# Patient Record
Sex: Male | Born: 1945 | Race: White | Hispanic: No | Marital: Married | Smoking: Never smoker
Health system: Southern US, Community
[De-identification: ages and names within clinical notes are randomized; demographics above are authoritative.]

## PROBLEM LIST (undated history)

## (undated) ENCOUNTER — Ambulatory Visit: Admission: EM | Payer: Medicare HMO | Source: Home / Self Care

## (undated) DIAGNOSIS — E7800 Pure hypercholesterolemia, unspecified: Secondary | ICD-10-CM

## (undated) DIAGNOSIS — I10 Essential (primary) hypertension: Secondary | ICD-10-CM

## (undated) DIAGNOSIS — Z8719 Personal history of other diseases of the digestive system: Secondary | ICD-10-CM

## (undated) DIAGNOSIS — D649 Anemia, unspecified: Secondary | ICD-10-CM

## (undated) DIAGNOSIS — K259 Gastric ulcer, unspecified as acute or chronic, without hemorrhage or perforation: Secondary | ICD-10-CM

## (undated) DIAGNOSIS — G4730 Sleep apnea, unspecified: Secondary | ICD-10-CM

## (undated) DIAGNOSIS — F32A Depression, unspecified: Secondary | ICD-10-CM

## (undated) DIAGNOSIS — K5730 Diverticulosis of large intestine without perforation or abscess without bleeding: Secondary | ICD-10-CM

## (undated) DIAGNOSIS — F419 Anxiety disorder, unspecified: Secondary | ICD-10-CM

## (undated) DIAGNOSIS — M797 Fibromyalgia: Secondary | ICD-10-CM

## (undated) HISTORY — PX: COLONOSCOPY: SHX174

## (undated) HISTORY — PX: OTHER SURGICAL HISTORY: SHX169

## (undated) HISTORY — PX: EYE SURGERY: SHX253

## (undated) HISTORY — PX: UPPER GASTROINTESTINAL ENDOSCOPY: SHX188

## (undated) HISTORY — DX: Personal history of other diseases of the digestive system: Z87.19

## (undated) HISTORY — DX: Fibromyalgia: M79.7

## (undated) HISTORY — PX: CHOLECYSTECTOMY: SHX55

---

## 1993-12-16 HISTORY — PX: GALLBLADDER SURGERY: SHX652

## 2002-09-23 ENCOUNTER — Ambulatory Visit (HOSPITAL_COMMUNITY): Admission: RE | Admit: 2002-09-23 | Discharge: 2002-09-23 | Payer: Self-pay | Admitting: Family Medicine

## 2002-09-23 ENCOUNTER — Encounter: Payer: Self-pay | Admitting: Family Medicine

## 2002-10-16 ENCOUNTER — Encounter: Payer: Self-pay | Admitting: Family Medicine

## 2002-10-16 ENCOUNTER — Ambulatory Visit (HOSPITAL_COMMUNITY): Admission: RE | Admit: 2002-10-16 | Discharge: 2002-10-16 | Payer: Self-pay | Admitting: Family Medicine

## 2002-12-25 ENCOUNTER — Encounter: Payer: Self-pay | Admitting: Family Medicine

## 2002-12-25 ENCOUNTER — Ambulatory Visit (HOSPITAL_COMMUNITY): Admission: RE | Admit: 2002-12-25 | Discharge: 2002-12-25 | Payer: Self-pay | Admitting: Family Medicine

## 2003-11-18 ENCOUNTER — Ambulatory Visit (HOSPITAL_COMMUNITY): Admission: RE | Admit: 2003-11-18 | Discharge: 2003-11-18 | Payer: Self-pay | Admitting: Family Medicine

## 2003-11-19 ENCOUNTER — Emergency Department (HOSPITAL_COMMUNITY): Admission: EM | Admit: 2003-11-19 | Discharge: 2003-11-20 | Payer: Self-pay | Admitting: Emergency Medicine

## 2004-04-24 ENCOUNTER — Ambulatory Visit: Payer: Self-pay | Admitting: Internal Medicine

## 2004-10-11 ENCOUNTER — Ambulatory Visit: Payer: Self-pay | Admitting: Internal Medicine

## 2004-11-06 ENCOUNTER — Ambulatory Visit (HOSPITAL_COMMUNITY): Admission: RE | Admit: 2004-11-06 | Discharge: 2004-11-06 | Payer: Self-pay | Admitting: Internal Medicine

## 2004-11-06 ENCOUNTER — Ambulatory Visit: Payer: Self-pay | Admitting: Internal Medicine

## 2005-08-20 ENCOUNTER — Inpatient Hospital Stay (HOSPITAL_COMMUNITY): Admission: EM | Admit: 2005-08-20 | Discharge: 2005-08-22 | Payer: Self-pay | Admitting: Emergency Medicine

## 2005-12-10 ENCOUNTER — Ambulatory Visit: Payer: Self-pay | Admitting: Internal Medicine

## 2013-01-27 ENCOUNTER — Other Ambulatory Visit (HOSPITAL_COMMUNITY): Payer: Self-pay | Admitting: Physician Assistant

## 2013-01-27 DIAGNOSIS — R109 Unspecified abdominal pain: Secondary | ICD-10-CM

## 2013-01-27 DIAGNOSIS — K649 Unspecified hemorrhoids: Secondary | ICD-10-CM

## 2013-01-29 ENCOUNTER — Encounter (HOSPITAL_COMMUNITY): Payer: Self-pay

## 2013-01-29 ENCOUNTER — Ambulatory Visit (HOSPITAL_COMMUNITY)
Admission: RE | Admit: 2013-01-29 | Discharge: 2013-01-29 | Disposition: A | Discharge status: Home / Self Care | Payer: Medicare Other | Source: Ambulatory Visit | Attending: Physician Assistant | Admitting: Physician Assistant

## 2013-01-29 DIAGNOSIS — K4090 Unilateral inguinal hernia, without obstruction or gangrene, not specified as recurrent: Secondary | ICD-10-CM | POA: Insufficient documentation

## 2013-01-29 DIAGNOSIS — R110 Nausea: Secondary | ICD-10-CM | POA: Insufficient documentation

## 2013-01-29 DIAGNOSIS — R109 Unspecified abdominal pain: Secondary | ICD-10-CM

## 2013-01-29 DIAGNOSIS — K649 Unspecified hemorrhoids: Secondary | ICD-10-CM

## 2013-01-29 DIAGNOSIS — R1031 Right lower quadrant pain: Secondary | ICD-10-CM | POA: Insufficient documentation

## 2013-01-29 HISTORY — DX: Essential (primary) hypertension: I10

## 2013-01-29 MED ORDER — IOHEXOL 300 MG/ML  SOLN
100.0000 mL | Freq: Once | INTRAMUSCULAR | Status: AC | PRN
  Administered 2013-01-29: 100 mL via INTRAVENOUS

## 2014-01-21 ENCOUNTER — Encounter (INDEPENDENT_AMBULATORY_CARE_PROVIDER_SITE_OTHER): Payer: Self-pay | Admitting: *Deleted

## 2014-04-20 ENCOUNTER — Ambulatory Visit (INDEPENDENT_AMBULATORY_CARE_PROVIDER_SITE_OTHER): Payer: Commercial Managed Care - HMO | Admitting: Internal Medicine

## 2014-04-20 ENCOUNTER — Encounter (INDEPENDENT_AMBULATORY_CARE_PROVIDER_SITE_OTHER): Payer: Self-pay | Admitting: Internal Medicine

## 2014-04-20 VITALS — BP 130/80 | HR 66 | Temp 97.5°F | Resp 16 | Ht 72.0 in | Wt 202.4 lb

## 2014-04-20 DIAGNOSIS — R109 Unspecified abdominal pain: Secondary | ICD-10-CM

## 2014-04-20 NOTE — Progress Notes (Signed)
   HPI   Review of Systems     Objective:   Physical Exam

## 2014-04-20 NOTE — Patient Instructions (Addendum)
Will review prior GI records and blood work before colonoscopy scheduled

## 2014-04-21 NOTE — Progress Notes (Signed)
Presenting complaint;  Abdominal pain and in need of screening colonoscopy.  History of present illness;  Patient is 68 year old Caucasian male who is referred through courtesy of Dr. Ethlyn Gallery in for GI evaluation. Patient is well known to me but has not been seen recently. Patient gives several month history of intermittent epigastric and midabdominal pain which is usually triggered with meals. It usually occurs after lunch and supper and may last for 1-2 hours. It is dull pain. He has noted abdominal pain occurs when he's having problems with allergy involving his skin or sinuses. This pain is never associated with nausea vomiting melena or rectal bleeding. He has sporadic heartburnand it comes in spellsand OTC Zantac takes care of it. He states when he has abdominal pain he takes Tylenol and Benadryl and it helps ease the pain. He has had intermittent constipation which she believes is secondary to antihypertensives. He is using OTC suppository once or twice a week with good results. He did try Colace but it didn't work. He consumes fruits on daily basis. He has good appetite and his weight has been stable. He denies rash fever chills or night sweats. He has chronic pain secondary to fibromyalgia and he goes to [employer] 3 times a week for exercise which has helped him a great deal. Regarding his allergies he did try Singulair and Allegra but had reaction to these medications. He does not take OTC NSAIDs. Patient's last colonoscopy was in May 2006.   Current Medications: Outpatient Encounter Prescriptions as of 04/20/2014  Medication Sig  . acetaminophen (TYLENOL) 650 MG CR tablet Take 650 mg by mouth 4 (four) times daily.  . diphenhydrAMINE (BENADRYL) 25 MG tablet Take 25 mg by mouth as needed.  . fluticasone (FLONASE ALLERGY RELIEF) 50 MCG/ACT nasal spray Place 2 sprays into both nostrils as needed for allergies or rhinitis.  . Homeopathic Products (ARNICA) GEL Apply topically as needed. Arnica  Motanna GEL  . hydrochlorothiazide (HYDRODIURIL) 12.5 MG tablet Take 12.5 mg by mouth daily.  Marland Kitchen levocetirizine (XYZAL) 5 MG tablet Take 5 mg by mouth every evening.  Marland Kitchen losartan (COZAAR) 50 MG tablet Take 50 mg by mouth daily.  . Olopatadine HCl (PATADAY) 0.2 % SOLN Apply to eye. One drop in each eye daily  . ranitidine (ZANTAC) 75 MG tablet Take 75 mg by mouth as needed for heartburn.  Marland Kitchen rOPINIRole (REQUIP) 1 MG tablet Take 1 mg by mouth every 12 (twelve) hours.  . sodium chloride (MURO 128) 5 % ophthalmic solution Place 1 drop into both eyes as needed for irritation.  Marland Kitchen testosterone cypionate (DEPOTESTOTERONE CYPIONATE) 200 MG/ML injection Inject into the muscle. Patient states that he rec'd an injection every 5-6 weeks  . traMADol (ULTRAM) 50 MG tablet Take by mouth 4 (four) times daily as needed.  . triazolam (HALCION) 0.25 MG tablet Take 0.25 mg by mouth at bedtime.  . trolamine salicylate (ASPERCREME) 10 % cream Apply 1 application topically as needed for muscle pain.   Past medical history; Hypertension of 8 years duration. He has problems with allergies. Restless leg syndrome. Fibromyalgia. Chronic insomnia. He had a laparoscopic cholecystectomy in 4081 complicated by bleed requiring laparotomy. He believes he received 2 or 4 units of PRBCs. Last colonoscopy was in May 2006 and no polyps were found.  Allergies; Allergies  Allergen Reactions  . Lodine [Etodolac]    Family history; Father died at age 50 of prostate cancer. Mother died at 71 of pancreatic carcinoma within 6 months of diagnosis. He  has 1 brother with Parkinson's disease and not doing well. He is 7 years old and bedridden. He has 3 sisters. One has visual problems once; one sister has asthma and younger sister age 34 is in good health.  Social history; He is married but does not have any children. He has never smoked cigarettes and drinks few sips of alcohol daily, always less than 1 drink per day. He is  retired. His work Engineer, structural.   Physical exam;  Blood pressure 130/80, pulse 66, temperature 97.5 F (36.4 C), temperature source Oral, resp. rate 16, height 6' (1.829 m), weight 202 lb 6.4 oz (91.808 kg). Patient is alert and in no acute distress. Conjunctiva is pink. Sclera is nonicteric Oropharyngeal mucosa is normal. No neck masses or thyromegaly noted. Cardiac exam with regular rhythm normal S1 and S2. No murmur or gallop noted. Lungs are clear to auscultation. Abdomen is symmetrical. Bowel sounds are normal. On palpation abdomen is soft and non-tender without organomegaly or masses. Rectal examination deferred as he had one in July revealing guaiac-negative stool.  No LE edema or clubbing noted.   Assessment:  #1. Intermittent upper and midabdominal pain which usually occurs when he is having problems with allergies and therefore may be allergy mediated. His pain is not progressive and he is able to control it with Tylenol and Benadryl and would consider further evaluation if symptoms progress. I would like to check eosinophil count with next significant episode.if indeed he has eosinophilia he may benefit from cromolyn sodium. #2. He is average risk for CRC. Will review prior records before scheduling him for screening colonoscopy.  Recommendations.  Review blood work from Dr. Berenice Bouton office. Requests prior colonoscopy rectal is scheduled. Patient will call if he has another episode of significant pain in which case we'll consider CBC with differential LFTs and serum amylase.

## 2014-04-21 NOTE — Progress Notes (Deleted)
Subjective:     Patient ID: Cole Brown, male   DOB: 1945-12-29, 68 y.o.   MRN: [id number]  HPI   Review of Systems     Objective:   Physical Exam     Assessment:     ***    Plan:     ***

## 2014-06-21 DIAGNOSIS — J301 Allergic rhinitis due to pollen: Secondary | ICD-10-CM | POA: Diagnosis not present

## 2014-06-21 DIAGNOSIS — J3089 Other allergic rhinitis: Secondary | ICD-10-CM | POA: Diagnosis not present

## 2014-06-21 DIAGNOSIS — J3081 Allergic rhinitis due to animal (cat) (dog) hair and dander: Secondary | ICD-10-CM | POA: Diagnosis not present

## 2014-06-21 DIAGNOSIS — L570 Actinic keratosis: Secondary | ICD-10-CM | POA: Diagnosis not present

## 2014-06-25 DIAGNOSIS — J3081 Allergic rhinitis due to animal (cat) (dog) hair and dander: Secondary | ICD-10-CM | POA: Diagnosis not present

## 2014-06-25 DIAGNOSIS — J301 Allergic rhinitis due to pollen: Secondary | ICD-10-CM | POA: Diagnosis not present

## 2014-06-25 DIAGNOSIS — J3089 Other allergic rhinitis: Secondary | ICD-10-CM | POA: Diagnosis not present

## 2014-06-30 DIAGNOSIS — E291 Testicular hypofunction: Secondary | ICD-10-CM | POA: Diagnosis not present

## 2014-06-30 DIAGNOSIS — J301 Allergic rhinitis due to pollen: Secondary | ICD-10-CM | POA: Diagnosis not present

## 2014-06-30 DIAGNOSIS — J3089 Other allergic rhinitis: Secondary | ICD-10-CM | POA: Diagnosis not present

## 2014-06-30 DIAGNOSIS — J3081 Allergic rhinitis due to animal (cat) (dog) hair and dander: Secondary | ICD-10-CM | POA: Diagnosis not present

## 2014-06-30 DIAGNOSIS — L570 Actinic keratosis: Secondary | ICD-10-CM | POA: Diagnosis not present

## 2014-07-05 DIAGNOSIS — J3081 Allergic rhinitis due to animal (cat) (dog) hair and dander: Secondary | ICD-10-CM | POA: Diagnosis not present

## 2014-07-05 DIAGNOSIS — J3089 Other allergic rhinitis: Secondary | ICD-10-CM | POA: Diagnosis not present

## 2014-07-05 DIAGNOSIS — J301 Allergic rhinitis due to pollen: Secondary | ICD-10-CM | POA: Diagnosis not present

## 2014-07-14 DIAGNOSIS — J3081 Allergic rhinitis due to animal (cat) (dog) hair and dander: Secondary | ICD-10-CM | POA: Diagnosis not present

## 2014-07-14 DIAGNOSIS — J3089 Other allergic rhinitis: Secondary | ICD-10-CM | POA: Diagnosis not present

## 2014-07-14 DIAGNOSIS — J301 Allergic rhinitis due to pollen: Secondary | ICD-10-CM | POA: Diagnosis not present

## 2014-07-19 DIAGNOSIS — J3089 Other allergic rhinitis: Secondary | ICD-10-CM | POA: Diagnosis not present

## 2014-07-19 DIAGNOSIS — J301 Allergic rhinitis due to pollen: Secondary | ICD-10-CM | POA: Diagnosis not present

## 2014-07-19 DIAGNOSIS — J3081 Allergic rhinitis due to animal (cat) (dog) hair and dander: Secondary | ICD-10-CM | POA: Diagnosis not present

## 2014-07-21 ENCOUNTER — Encounter (INDEPENDENT_AMBULATORY_CARE_PROVIDER_SITE_OTHER): Payer: Self-pay

## 2014-07-26 DIAGNOSIS — J301 Allergic rhinitis due to pollen: Secondary | ICD-10-CM | POA: Diagnosis not present

## 2014-07-26 DIAGNOSIS — J3089 Other allergic rhinitis: Secondary | ICD-10-CM | POA: Diagnosis not present

## 2014-07-26 DIAGNOSIS — E236 Other disorders of pituitary gland: Secondary | ICD-10-CM | POA: Diagnosis not present

## 2014-07-26 DIAGNOSIS — J3081 Allergic rhinitis due to animal (cat) (dog) hair and dander: Secondary | ICD-10-CM | POA: Diagnosis not present

## 2014-08-03 DIAGNOSIS — E291 Testicular hypofunction: Secondary | ICD-10-CM | POA: Diagnosis not present

## 2014-08-04 DIAGNOSIS — J3081 Allergic rhinitis due to animal (cat) (dog) hair and dander: Secondary | ICD-10-CM | POA: Diagnosis not present

## 2014-08-04 DIAGNOSIS — J301 Allergic rhinitis due to pollen: Secondary | ICD-10-CM | POA: Diagnosis not present

## 2014-08-04 DIAGNOSIS — J3089 Other allergic rhinitis: Secondary | ICD-10-CM | POA: Diagnosis not present

## 2014-08-09 DIAGNOSIS — J301 Allergic rhinitis due to pollen: Secondary | ICD-10-CM | POA: Diagnosis not present

## 2014-08-09 DIAGNOSIS — J3081 Allergic rhinitis due to animal (cat) (dog) hair and dander: Secondary | ICD-10-CM | POA: Diagnosis not present

## 2014-08-09 DIAGNOSIS — J3089 Other allergic rhinitis: Secondary | ICD-10-CM | POA: Diagnosis not present

## 2014-08-16 DIAGNOSIS — J3081 Allergic rhinitis due to animal (cat) (dog) hair and dander: Secondary | ICD-10-CM | POA: Diagnosis not present

## 2014-08-16 DIAGNOSIS — J301 Allergic rhinitis due to pollen: Secondary | ICD-10-CM | POA: Diagnosis not present

## 2014-08-16 DIAGNOSIS — J3089 Other allergic rhinitis: Secondary | ICD-10-CM | POA: Diagnosis not present

## 2014-08-23 DIAGNOSIS — J3089 Other allergic rhinitis: Secondary | ICD-10-CM | POA: Diagnosis not present

## 2014-08-23 DIAGNOSIS — J3081 Allergic rhinitis due to animal (cat) (dog) hair and dander: Secondary | ICD-10-CM | POA: Diagnosis not present

## 2014-08-23 DIAGNOSIS — J301 Allergic rhinitis due to pollen: Secondary | ICD-10-CM | POA: Diagnosis not present

## 2014-08-25 DIAGNOSIS — E291 Testicular hypofunction: Secondary | ICD-10-CM | POA: Diagnosis not present

## 2014-09-01 DIAGNOSIS — J301 Allergic rhinitis due to pollen: Secondary | ICD-10-CM | POA: Diagnosis not present

## 2014-09-01 DIAGNOSIS — J3081 Allergic rhinitis due to animal (cat) (dog) hair and dander: Secondary | ICD-10-CM | POA: Diagnosis not present

## 2014-09-01 DIAGNOSIS — J3089 Other allergic rhinitis: Secondary | ICD-10-CM | POA: Diagnosis not present

## 2014-09-06 DIAGNOSIS — J301 Allergic rhinitis due to pollen: Secondary | ICD-10-CM | POA: Diagnosis not present

## 2014-09-06 DIAGNOSIS — J3089 Other allergic rhinitis: Secondary | ICD-10-CM | POA: Diagnosis not present

## 2014-09-06 DIAGNOSIS — J3081 Allergic rhinitis due to animal (cat) (dog) hair and dander: Secondary | ICD-10-CM | POA: Diagnosis not present

## 2014-09-13 DIAGNOSIS — J3089 Other allergic rhinitis: Secondary | ICD-10-CM | POA: Diagnosis not present

## 2014-09-13 DIAGNOSIS — J301 Allergic rhinitis due to pollen: Secondary | ICD-10-CM | POA: Diagnosis not present

## 2014-09-17 DIAGNOSIS — E291 Testicular hypofunction: Secondary | ICD-10-CM | POA: Diagnosis not present

## 2014-09-17 DIAGNOSIS — J301 Allergic rhinitis due to pollen: Secondary | ICD-10-CM | POA: Diagnosis not present

## 2014-09-17 DIAGNOSIS — J3089 Other allergic rhinitis: Secondary | ICD-10-CM | POA: Diagnosis not present

## 2014-09-20 DIAGNOSIS — J3089 Other allergic rhinitis: Secondary | ICD-10-CM | POA: Diagnosis not present

## 2014-09-20 DIAGNOSIS — J3081 Allergic rhinitis due to animal (cat) (dog) hair and dander: Secondary | ICD-10-CM | POA: Diagnosis not present

## 2014-09-20 DIAGNOSIS — J301 Allergic rhinitis due to pollen: Secondary | ICD-10-CM | POA: Diagnosis not present

## 2014-09-24 DIAGNOSIS — H1045 Other chronic allergic conjunctivitis: Secondary | ICD-10-CM | POA: Diagnosis not present

## 2014-09-24 DIAGNOSIS — J301 Allergic rhinitis due to pollen: Secondary | ICD-10-CM | POA: Diagnosis not present

## 2014-09-24 DIAGNOSIS — J3089 Other allergic rhinitis: Secondary | ICD-10-CM | POA: Diagnosis not present

## 2014-09-24 DIAGNOSIS — J3081 Allergic rhinitis due to animal (cat) (dog) hair and dander: Secondary | ICD-10-CM | POA: Diagnosis not present

## 2014-09-27 DIAGNOSIS — J3089 Other allergic rhinitis: Secondary | ICD-10-CM | POA: Diagnosis not present

## 2014-09-27 DIAGNOSIS — J301 Allergic rhinitis due to pollen: Secondary | ICD-10-CM | POA: Diagnosis not present

## 2014-09-27 DIAGNOSIS — J3081 Allergic rhinitis due to animal (cat) (dog) hair and dander: Secondary | ICD-10-CM | POA: Diagnosis not present

## 2014-10-01 DIAGNOSIS — E291 Testicular hypofunction: Secondary | ICD-10-CM | POA: Diagnosis not present

## 2014-10-01 DIAGNOSIS — J301 Allergic rhinitis due to pollen: Secondary | ICD-10-CM | POA: Diagnosis not present

## 2014-10-01 DIAGNOSIS — J3081 Allergic rhinitis due to animal (cat) (dog) hair and dander: Secondary | ICD-10-CM | POA: Diagnosis not present

## 2014-10-01 DIAGNOSIS — J3089 Other allergic rhinitis: Secondary | ICD-10-CM | POA: Diagnosis not present

## 2014-10-04 DIAGNOSIS — J301 Allergic rhinitis due to pollen: Secondary | ICD-10-CM | POA: Diagnosis not present

## 2014-10-04 DIAGNOSIS — J3089 Other allergic rhinitis: Secondary | ICD-10-CM | POA: Diagnosis not present

## 2014-10-04 DIAGNOSIS — J3081 Allergic rhinitis due to animal (cat) (dog) hair and dander: Secondary | ICD-10-CM | POA: Diagnosis not present

## 2014-10-06 DIAGNOSIS — H01005 Unspecified blepharitis left lower eyelid: Secondary | ICD-10-CM | POA: Diagnosis not present

## 2014-10-06 DIAGNOSIS — E663 Overweight: Secondary | ICD-10-CM | POA: Diagnosis not present

## 2014-10-06 DIAGNOSIS — Z6828 Body mass index (BMI) 28.0-28.9, adult: Secondary | ICD-10-CM | POA: Diagnosis not present

## 2014-10-08 DIAGNOSIS — J3089 Other allergic rhinitis: Secondary | ICD-10-CM | POA: Diagnosis not present

## 2014-10-08 DIAGNOSIS — J301 Allergic rhinitis due to pollen: Secondary | ICD-10-CM | POA: Diagnosis not present

## 2014-10-08 DIAGNOSIS — J3081 Allergic rhinitis due to animal (cat) (dog) hair and dander: Secondary | ICD-10-CM | POA: Diagnosis not present

## 2014-10-11 DIAGNOSIS — J3089 Other allergic rhinitis: Secondary | ICD-10-CM | POA: Diagnosis not present

## 2014-10-11 DIAGNOSIS — J301 Allergic rhinitis due to pollen: Secondary | ICD-10-CM | POA: Diagnosis not present

## 2014-10-11 DIAGNOSIS — J3081 Allergic rhinitis due to animal (cat) (dog) hair and dander: Secondary | ICD-10-CM | POA: Diagnosis not present

## 2014-10-18 DIAGNOSIS — J301 Allergic rhinitis due to pollen: Secondary | ICD-10-CM | POA: Diagnosis not present

## 2014-10-18 DIAGNOSIS — J3089 Other allergic rhinitis: Secondary | ICD-10-CM | POA: Diagnosis not present

## 2014-10-19 ENCOUNTER — Encounter (INDEPENDENT_AMBULATORY_CARE_PROVIDER_SITE_OTHER): Payer: Self-pay | Admitting: *Deleted

## 2014-10-25 DIAGNOSIS — J3089 Other allergic rhinitis: Secondary | ICD-10-CM | POA: Diagnosis not present

## 2014-10-25 DIAGNOSIS — E291 Testicular hypofunction: Secondary | ICD-10-CM | POA: Diagnosis not present

## 2014-10-25 DIAGNOSIS — J301 Allergic rhinitis due to pollen: Secondary | ICD-10-CM | POA: Diagnosis not present

## 2014-10-25 DIAGNOSIS — J3081 Allergic rhinitis due to animal (cat) (dog) hair and dander: Secondary | ICD-10-CM | POA: Diagnosis not present

## 2014-11-01 DIAGNOSIS — J301 Allergic rhinitis due to pollen: Secondary | ICD-10-CM | POA: Diagnosis not present

## 2014-11-01 DIAGNOSIS — J3089 Other allergic rhinitis: Secondary | ICD-10-CM | POA: Diagnosis not present

## 2014-11-03 ENCOUNTER — Other Ambulatory Visit (INDEPENDENT_AMBULATORY_CARE_PROVIDER_SITE_OTHER): Payer: Self-pay | Admitting: *Deleted

## 2014-11-03 DIAGNOSIS — Z1211 Encounter for screening for malignant neoplasm of colon: Secondary | ICD-10-CM

## 2014-11-08 DIAGNOSIS — J3089 Other allergic rhinitis: Secondary | ICD-10-CM | POA: Diagnosis not present

## 2014-11-08 DIAGNOSIS — J301 Allergic rhinitis due to pollen: Secondary | ICD-10-CM | POA: Diagnosis not present

## 2014-11-16 DIAGNOSIS — E291 Testicular hypofunction: Secondary | ICD-10-CM | POA: Diagnosis not present

## 2014-11-17 DIAGNOSIS — J3089 Other allergic rhinitis: Secondary | ICD-10-CM | POA: Diagnosis not present

## 2014-11-17 DIAGNOSIS — J301 Allergic rhinitis due to pollen: Secondary | ICD-10-CM | POA: Diagnosis not present

## 2014-11-22 DIAGNOSIS — J3089 Other allergic rhinitis: Secondary | ICD-10-CM | POA: Diagnosis not present

## 2014-11-22 DIAGNOSIS — J301 Allergic rhinitis due to pollen: Secondary | ICD-10-CM | POA: Diagnosis not present

## 2014-11-22 DIAGNOSIS — J3081 Allergic rhinitis due to animal (cat) (dog) hair and dander: Secondary | ICD-10-CM | POA: Diagnosis not present

## 2014-11-29 DIAGNOSIS — J3089 Other allergic rhinitis: Secondary | ICD-10-CM | POA: Diagnosis not present

## 2014-11-29 DIAGNOSIS — J301 Allergic rhinitis due to pollen: Secondary | ICD-10-CM | POA: Diagnosis not present

## 2014-12-06 DIAGNOSIS — J3081 Allergic rhinitis due to animal (cat) (dog) hair and dander: Secondary | ICD-10-CM | POA: Diagnosis not present

## 2014-12-06 DIAGNOSIS — J301 Allergic rhinitis due to pollen: Secondary | ICD-10-CM | POA: Diagnosis not present

## 2014-12-06 DIAGNOSIS — J3089 Other allergic rhinitis: Secondary | ICD-10-CM | POA: Diagnosis not present

## 2014-12-07 DIAGNOSIS — E291 Testicular hypofunction: Secondary | ICD-10-CM | POA: Diagnosis not present

## 2014-12-13 DIAGNOSIS — J3089 Other allergic rhinitis: Secondary | ICD-10-CM | POA: Diagnosis not present

## 2014-12-13 DIAGNOSIS — J301 Allergic rhinitis due to pollen: Secondary | ICD-10-CM | POA: Diagnosis not present

## 2014-12-14 ENCOUNTER — Telehealth (INDEPENDENT_AMBULATORY_CARE_PROVIDER_SITE_OTHER): Payer: Self-pay | Admitting: *Deleted

## 2014-12-14 MED ORDER — PEG 3350-KCL-NA BICARB-NACL 420 G PO SOLR: 4000.0000 mL | Freq: Once | ORAL | Status: DC

## 2014-12-14 NOTE — Telephone Encounter (Signed)
Patient needs trilyte 

## 2014-12-22 DIAGNOSIS — J301 Allergic rhinitis due to pollen: Secondary | ICD-10-CM | POA: Diagnosis not present

## 2014-12-22 DIAGNOSIS — J3081 Allergic rhinitis due to animal (cat) (dog) hair and dander: Secondary | ICD-10-CM | POA: Diagnosis not present

## 2014-12-22 DIAGNOSIS — J3089 Other allergic rhinitis: Secondary | ICD-10-CM | POA: Diagnosis not present

## 2014-12-27 DIAGNOSIS — J3089 Other allergic rhinitis: Secondary | ICD-10-CM | POA: Diagnosis not present

## 2014-12-27 DIAGNOSIS — J301 Allergic rhinitis due to pollen: Secondary | ICD-10-CM | POA: Diagnosis not present

## 2014-12-28 DIAGNOSIS — E291 Testicular hypofunction: Secondary | ICD-10-CM | POA: Diagnosis not present

## 2015-01-03 ENCOUNTER — Telehealth (INDEPENDENT_AMBULATORY_CARE_PROVIDER_SITE_OTHER): Payer: Self-pay | Admitting: *Deleted

## 2015-01-03 DIAGNOSIS — J301 Allergic rhinitis due to pollen: Secondary | ICD-10-CM | POA: Diagnosis not present

## 2015-01-03 DIAGNOSIS — J3089 Other allergic rhinitis: Secondary | ICD-10-CM | POA: Diagnosis not present

## 2015-01-03 DIAGNOSIS — J3081 Allergic rhinitis due to animal (cat) (dog) hair and dander: Secondary | ICD-10-CM | POA: Diagnosis not present

## 2015-01-03 NOTE — Telephone Encounter (Signed)
Referring MD/PCP: koberlein   Procedure: tcs  Reason/Indication:  screening  Has patient had this procedure before?  Yes, 2006 -- sacnned  If so, when, by whom and where?    Is there a family history of colon cancer?  no  Who?  What age when diagnosed?    Is patient diabetic?   no      Does patient have prosthetic heart valve?  no  Do you have a pacemaker?  no  Has patient ever had endocarditis? no  Has patient had joint replacement within last 12 months?  no  Does patient tend to be constipated or take laxatives? no  Is patient on Coumadin, Plavix and/or Aspirin? no  Medications: see epic  Allergies: lodine  Medication Adjustment:   Procedure date & time: 01/27/15 at 930

## 2015-01-05 NOTE — Telephone Encounter (Signed)
agree

## 2015-01-12 DIAGNOSIS — J3081 Allergic rhinitis due to animal (cat) (dog) hair and dander: Secondary | ICD-10-CM | POA: Diagnosis not present

## 2015-01-12 DIAGNOSIS — J301 Allergic rhinitis due to pollen: Secondary | ICD-10-CM | POA: Diagnosis not present

## 2015-01-12 DIAGNOSIS — J3089 Other allergic rhinitis: Secondary | ICD-10-CM | POA: Diagnosis not present

## 2015-01-13 DIAGNOSIS — J301 Allergic rhinitis due to pollen: Secondary | ICD-10-CM | POA: Diagnosis not present

## 2015-01-18 DIAGNOSIS — E291 Testicular hypofunction: Secondary | ICD-10-CM | POA: Diagnosis not present

## 2015-01-19 DIAGNOSIS — J301 Allergic rhinitis due to pollen: Secondary | ICD-10-CM | POA: Diagnosis not present

## 2015-01-19 DIAGNOSIS — J3089 Other allergic rhinitis: Secondary | ICD-10-CM | POA: Diagnosis not present

## 2015-01-19 DIAGNOSIS — J3081 Allergic rhinitis due to animal (cat) (dog) hair and dander: Secondary | ICD-10-CM | POA: Diagnosis not present

## 2015-01-24 DIAGNOSIS — J3089 Other allergic rhinitis: Secondary | ICD-10-CM | POA: Diagnosis not present

## 2015-01-24 DIAGNOSIS — J301 Allergic rhinitis due to pollen: Secondary | ICD-10-CM | POA: Diagnosis not present

## 2015-01-24 DIAGNOSIS — J3081 Allergic rhinitis due to animal (cat) (dog) hair and dander: Secondary | ICD-10-CM | POA: Diagnosis not present

## 2015-01-27 ENCOUNTER — Encounter (HOSPITAL_COMMUNITY)
Admission: RE | Disposition: A | Discharge status: Home / Self Care | Payer: Self-pay | Source: Ambulatory Visit | Attending: Internal Medicine

## 2015-01-27 ENCOUNTER — Ambulatory Visit (HOSPITAL_COMMUNITY)
Admission: RE | Admit: 2015-01-27 | Discharge: 2015-01-27 | Disposition: A | Discharge status: Home / Self Care | Payer: Commercial Managed Care - HMO | Source: Ambulatory Visit | Attending: Internal Medicine | Admitting: Internal Medicine

## 2015-01-27 ENCOUNTER — Encounter (HOSPITAL_COMMUNITY): Payer: Self-pay

## 2015-01-27 DIAGNOSIS — Z888 Allergy status to other drugs, medicaments and biological substances status: Secondary | ICD-10-CM | POA: Insufficient documentation

## 2015-01-27 DIAGNOSIS — Z1211 Encounter for screening for malignant neoplasm of colon: Secondary | ICD-10-CM | POA: Diagnosis not present

## 2015-01-27 DIAGNOSIS — Z800 Family history of malignant neoplasm of digestive organs: Secondary | ICD-10-CM | POA: Insufficient documentation

## 2015-01-27 DIAGNOSIS — K5730 Diverticulosis of large intestine without perforation or abscess without bleeding: Secondary | ICD-10-CM | POA: Insufficient documentation

## 2015-01-27 DIAGNOSIS — K648 Other hemorrhoids: Secondary | ICD-10-CM | POA: Diagnosis not present

## 2015-01-27 DIAGNOSIS — K644 Residual hemorrhoidal skin tags: Secondary | ICD-10-CM | POA: Diagnosis not present

## 2015-01-27 DIAGNOSIS — M797 Fibromyalgia: Secondary | ICD-10-CM | POA: Diagnosis not present

## 2015-01-27 DIAGNOSIS — I10 Essential (primary) hypertension: Secondary | ICD-10-CM | POA: Insufficient documentation

## 2015-01-27 DIAGNOSIS — Z8042 Family history of malignant neoplasm of prostate: Secondary | ICD-10-CM | POA: Diagnosis not present

## 2015-01-27 HISTORY — PX: COLONOSCOPY: SHX5424

## 2015-01-27 SURGERY — COLONOSCOPY
Anesthesia: Moderate Sedation

## 2015-01-27 MED ORDER — MIDAZOLAM HCL 5 MG/5ML IJ SOLN
INTRAMUSCULAR | Status: DC | PRN
  Administered 2015-01-27 (×2): 2 mg via INTRAVENOUS
  Administered 2015-01-27: 1 mg via INTRAVENOUS

## 2015-01-27 MED ORDER — MEPERIDINE HCL 50 MG/ML IJ SOLN
INTRAMUSCULAR | Status: AC
  Filled 2015-01-27: qty 1

## 2015-01-27 MED ORDER — STERILE WATER FOR IRRIGATION IR SOLN
Status: DC | PRN
  Administered 2015-01-27: 10:00:00

## 2015-01-27 MED ORDER — MEPERIDINE HCL 50 MG/ML IJ SOLN
INTRAMUSCULAR | Status: DC | PRN
  Administered 2015-01-27 (×2): 25 mg via INTRAVENOUS

## 2015-01-27 MED ORDER — SODIUM CHLORIDE 0.9 % IV SOLN
INTRAVENOUS | Status: DC
Start: 2015-01-27 — End: 2015-01-27
  Administered 2015-01-27: 09:00:00 via INTRAVENOUS

## 2015-01-27 MED ORDER — MIDAZOLAM HCL 5 MG/5ML IJ SOLN
INTRAMUSCULAR | Status: AC
  Filled 2015-01-27: qty 10

## 2015-01-27 NOTE — Op Note (Signed)
COLONOSCOPY PROCEDURE REPORT  PATIENT:  Cole Brown  MR#:  [id number] Birthdate:  [birth year], 69 y.o., male Endoscopist:  Dr. Rogene Houston, MD Referred By:  Dr. Rocky Morel, MD  Procedure Date: 01/27/2015  Procedure:   Colonoscopy  Indications:  Patient is 69 year old Caucasian male who is undergoing average risk screening colonoscopy. His last exam was about 10 years ago.  Informed Consent:  The procedure and risks were reviewed with the patient and informed consent was obtained.  Medications:  Demerol 50 mg IV Versed 5 mg IV  Description of procedure:  After a digital rectal exam was performed, that colonoscope was advanced from the anus through the rectum and colon to the area of the cecum, ileocecal valve and appendiceal orifice. The cecum was deeply intubated. These structures were well-seen and photographed for the record. From the level of the cecum and ileocecal valve, the scope was slowly and cautiously withdrawn. The mucosal surfaces were carefully surveyed utilizing scope tip to flexion to facilitate fold flattening as needed. The scope was pulled down into the rectum where a thorough exam including retroflexion was performed.  Findings:   Prep satisfactory. Normal mucosa of cecum, ascending colon, hepatic flexure, transverse colon and descending colon. Multiple diverticula noted at sigmoid colon. Normal rectal mucosa. Prominent hemorrhoids noted below the dentate line.   Therapeutic/Diagnostic Maneuvers Performed:  None  Complications:  None  Cecal Withdrawal Time:  11 minutes  Impression:  Examination performed to cecum. Multiple diverticula at sigmoid colon. External hemorrhoids. No evidence of colonic polyps.  Recommendations:  Standard instructions given. High fiber diet.  Marshawn Normoyle U  01/27/2015 10:05 AM  CC: Dr. Ethlyn Gallery, Kassie Mends, MD & Dr. Rayne Du ref. provider found

## 2015-01-27 NOTE — Discharge Instructions (Signed)
Resume usual medications and high fiber diet. No driving for 24 hours.    Colonoscopy, Care After These instructions give you information on caring for yourself after your procedure. Your doctor may also give you more specific instructions. Call your doctor if you have any problems or questions after your procedure. HOME CARE  Do not drive for 24 hours.  Do not sign important papers or use machinery for 24 hours.  You may shower.  You may go back to your usual activities, but go slower for the first 24 hours.  Take rest breaks often during the first 24 hours.  Walk around or use warm packs on your belly (abdomen) if you have belly cramping or gas.  Drink enough fluids to keep your pee (urine) clear or pale yellow.  Resume your normal diet. Avoid heavy or fried foods.  Avoid drinking alcohol for 24 hours or as told by your doctor.  Only take medicines as told by your doctor. If a tissue sample (biopsy) was taken during the procedure:   Do not take aspirin or blood thinners for 7 days, or as told by your doctor.  Do not drink alcohol for 7 days, or as told by your doctor.  Eat soft foods for the first 24 hours. GET HELP IF: You still have a small amount of blood in your poop (stool) 2-3 days after the procedure. GET HELP RIGHT AWAY IF:  You have more than a small amount of blood in your poop.  You see clumps of tissue (blood clots) in your poop.  Your belly is puffy (swollen).  You feel sick to your stomach (nauseous) or throw up (vomit).  You have a fever.  You have belly pain that gets worse and medicine does not help. MAKE SURE YOU:  Understand these instructions.  Will watch your condition.  Will get help right away if you are not doing well or get worse. Document Released: 07/07/2010 Document Revised: 06/09/2013 Document Reviewed: 02/09/2013 [facility] Patient Information 2015 [place], [place]. This information is not intended to replace advice given to you  by your health care provider. Make sure you discuss any questions you have with your health care provider.     High-Fiber Diet Fiber is found in fruits, vegetables, and grains. A high-fiber diet encourages the addition of more whole grains, legumes, fruits, and vegetables in your diet. The recommended amount of fiber for adult males is 38 g per day. For adult females, it is 25 g per day. Pregnant and lactating women should get 28 g of fiber per day. If you have a digestive or bowel problem, ask your caregiver for advice before adding high-fiber foods to your diet. Eat a variety of high-fiber foods instead of only a select few type of foods.  PURPOSE  To increase stool bulk.  To make bowel movements more regular to prevent constipation.  To lower cholesterol.  To prevent overeating. WHEN IS THIS DIET USED?  It may be used if you have constipation and hemorrhoids.  It may be used if you have uncomplicated diverticulosis (intestine condition) and irritable bowel syndrome.  It may be used if you need help with weight management.  It may be used if you want to add it to your diet as a protective measure against atherosclerosis, diabetes, and cancer. SOURCES OF FIBER  Whole-grain breads and cereals.  Fruits, such as apples, oranges, bananas, berries, prunes, and pears.  Vegetables, such as green peas, carrots, sweet potatoes, beets, broccoli, cabbage, spinach, and  artichokes.  Legumes, such split peas, soy, lentils.  Almonds. FIBER CONTENT IN FOODS Starches and Grains / Dietary Fiber (g)  Cheerios, 1 cup / 3 g  Corn Flakes cereal, 1 cup / 0.7 g  Rice crispy treat cereal, 1 cup / 0.3 g  Instant oatmeal (cooked),  cup / 2 g  Frosted wheat cereal, 1 cup / 5.1 g  Brown, long-grain rice (cooked), 1 cup / 3.5 g  White, long-grain rice (cooked), 1 cup / 0.6 g  Enriched macaroni (cooked), 1 cup / 2.5 g Legumes / Dietary Fiber (g)  Baked beans (canned, plain, or  vegetarian),  cup / 5.2 g  Kidney beans (canned),  cup / 6.8 g  Pinto beans (cooked),  cup / 5.5 g Breads and Crackers / Dietary Fiber (g)  Plain or honey graham crackers, 2 squares / 0.7 g  Saltine crackers, 3 squares / 0.3 g  Plain, salted pretzels, 10 pieces / 1.8 g  Whole-wheat bread, 1 slice / 1.9 g  White bread, 1 slice / 0.7 g  Raisin bread, 1 slice / 1.2 g  Plain bagel, 3 oz / 2 g  Flour tortilla, 1 oz / 0.9 g  Corn tortilla, 1 small / 1.5 g  Hamburger or hotdog bun, 1 small / 0.9 g Fruits / Dietary Fiber (g)  Apple with skin, 1 medium / 4.4 g  Sweetened applesauce,  cup / 1.5 g  Banana,  medium / 1.5 g  Grapes, 10 grapes / 0.4 g  Orange, 1 small / 2.3 g  Raisin, 1.5 oz / 1.6 g  Melon, 1 cup / 1.4 g Vegetables / Dietary Fiber (g)  Green beans (canned),  cup / 1.3 g  Carrots (cooked),  cup / 2.3 g  Broccoli (cooked),  cup / 2.8 g  Peas (cooked),  cup / 4.4 g  Mashed potatoes,  cup / 1.6 g  Lettuce, 1 cup / 0.5 g  Corn (canned),  cup / 1.6 g  Tomato,  cup / 1.1 g Document Released: 06/04/2005 Document Revised: 12/04/2011 Document Reviewed: 09/06/2011 [employer] Patient Information 2015 [place], [place]. This information is not intended to replace advice given to you by your health care provider. Make sure you discuss any questions you have with your health care provider.   Diverticulosis Diverticulosis is the condition that develops when small pouches (diverticula) form in the wall of your colon. Your colon, or large intestine, is where water is absorbed and stool is formed. The pouches form when the inside layer of your colon pushes through weak spots in the outer layers of your colon. CAUSES  No one knows exactly what causes diverticulosis. RISK FACTORS  Being older than 62. Your risk for this condition increases with age. Diverticulosis is rare in people younger than 40 years. By age 78, almost everyone has it.  Eating a  low-fiber diet.  Being frequently constipated.  Being overweight.  Not getting enough exercise.  Smoking.  Taking over-the-counter pain medicines, like aspirin and ibuprofen. SYMPTOMS  Most people with diverticulosis do not have symptoms. DIAGNOSIS  Because diverticulosis often has no symptoms, health care providers often discover the condition during an exam for other colon problems. In many cases, a health care provider will diagnose diverticulosis while using a flexible scope to examine the colon (colonoscopy). TREATMENT  If you have never developed an infection related to diverticulosis, you may not need treatment. If you have had an infection before, treatment may include:  Eating more fruits, vegetables,  and grains.  Taking a fiber supplement.  Taking a live bacteria supplement (probiotic).  Taking medicine to relax your colon. HOME CARE INSTRUCTIONS   Drink at least 6-8 glasses of water each day to prevent constipation.  Try not to strain when you have a bowel movement.  Keep all follow-up appointments. If you have had an infection before:  Increase the fiber in your diet as directed by your health care provider or dietitian.  Take a dietary fiber supplement if your health care provider approves.  Only take medicines as directed by your health care provider. SEEK MEDICAL CARE IF:   You have abdominal pain.  You have bloating.  You have cramps.  You have not gone to the bathroom in 3 days. SEEK IMMEDIATE MEDICAL CARE IF:   Your pain gets worse.  Yourbloating becomes very bad.  You have a fever or chills, and your symptoms suddenly get worse.  You begin vomiting.  You have bowel movements that are bloody or black. MAKE SURE YOU:  Understand these instructions.  Will watch your condition.  Will get help right away if you are not doing well or get worse. Document Released: 03/01/2004 Document Revised: 06/09/2013 Document Reviewed:  04/29/2013 [facility] Patient Information 2015 [place], [place]. This information is not intended to replace advice given to you by your health care provider. Make sure you discuss any questions you have with your health care provider.   Hemorrhoids Hemorrhoids are swollen veins around the rectum or anus. There are two types of hemorrhoids:   Internal hemorrhoids. These occur in the veins just inside the rectum. They may poke through to the outside and become irritated and painful.  External hemorrhoids. These occur in the veins outside the anus and can be felt as a painful swelling or hard lump near the anus. CAUSES  Pregnancy.   Obesity.   Constipation or diarrhea.   Straining to have a bowel movement.   Sitting for long periods on the toilet.  Heavy lifting or other activity that caused you to strain.  Anal intercourse. SYMPTOMS   Pain.   Anal itching or irritation.   Rectal bleeding.   Fecal leakage.   Anal swelling.   One or more lumps around the anus.  DIAGNOSIS  Your caregiver may be able to diagnose hemorrhoids by visual examination. Other examinations or tests that may be performed include:   Examination of the rectal area with a gloved hand (digital rectal exam).   Examination of anal canal using a small tube (scope).   A blood test if you have lost a significant amount of blood.  A test to look inside the colon (sigmoidoscopy or colonoscopy). TREATMENT Most hemorrhoids can be treated at home. However, if symptoms do not seem to be getting better or if you have a lot of rectal bleeding, your caregiver may perform a procedure to help make the hemorrhoids get smaller or remove them completely. Possible treatments include:   Placing a rubber band at the base of the hemorrhoid to cut off the circulation (rubber band ligation).   Injecting a chemical to shrink the hemorrhoid (sclerotherapy).   Using a tool to burn the hemorrhoid (infrared light  therapy).   Surgically removing the hemorrhoid (hemorrhoidectomy).   Stapling the hemorrhoid to block blood flow to the tissue (hemorrhoid stapling).  HOME CARE INSTRUCTIONS   Eat foods with fiber, such as whole grains, beans, nuts, fruits, and vegetables. Ask your doctor about taking products with added fiber in them (fibersupplements).  Increase fluid intake. Drink enough water and fluids to keep your urine clear or pale yellow.   Exercise regularly.   Go to the bathroom when you have the urge to have a bowel movement. Do not wait.   Avoid straining to have bowel movements.   Keep the anal area dry and clean. Use wet toilet paper or moist towelettes after a bowel movement.   Medicated creams and suppositories may be used or applied as directed.   Only take over-the-counter or prescription medicines as directed by your caregiver.   Take warm sitz baths for 15-20 minutes, 3-4 times a day to ease pain and discomfort.   Place ice packs on the hemorrhoids if they are tender and swollen. Using ice packs between sitz baths may be helpful.   Put ice in a plastic bag.   Place a towel between your skin and the bag.   Leave the ice on for 15-20 minutes, 3-4 times a day.   Do not use a donut-shaped pillow or sit on the toilet for long periods. This increases blood pooling and pain.  SEEK MEDICAL CARE IF:  You have increasing pain and swelling that is not controlled by treatment or medicine.  You have uncontrolled bleeding.  You have difficulty or you are unable to have a bowel movement.  You have pain or inflammation outside the area of the hemorrhoids. MAKE SURE YOU:  Understand these instructions.  Will watch your condition.  Will get help right away if you are not doing well or get worse. Document Released: 06/01/2000 Document Revised: 05/21/2012 Document Reviewed: 04/08/2012 [facility] Patient Information 2015 [place], [place]. This information is not  intended to replace advice given to you by your health care provider. Make sure you discuss any questions you have with your health care provider.

## 2015-01-27 NOTE — H&P (Signed)
Cole Brown is an 69 y.o. male.   Chief Complaint: Patient's here for colonoscopy. HPI: She is 69 year old Caucasian male who is here for screening colonoscopy. He denies change in bowel habits rectal bleeding or melena. He has sporadic abdominal pain felt to be due to fibromyalgia. Last colonoscopy was in May 2006. Family history is negative for CRC.  Past Medical History  Diagnosis Date  . Hypertension   . Fibromyalgia     Past Surgical History  Procedure Laterality Date  . Gallbladder surgery  12/1993  . Colonoscopy      Dr.Airica Schwartzkopf  . Upper gastrointestinal endoscopy    . Allergy shots  weekly    Family History  Problem Relation Age of Onset  . Heart disease Mother   . Pancreatic cancer Mother   . Prostate cancer Father   . Healthy Sister   . Parkinson's disease Brother   . Asthma Sister   . Healthy Sister    Social History:  reports that he has never smoked. He has never used smokeless tobacco. He reports that he drinks alcohol. He reports that he does not use illicit drugs.  Allergies:  Allergies  Allergen Reactions  . Lodine [Etodolac] Swelling and Rash    Medications Prior to Admission  Medication Sig Dispense Refill  . acetaminophen (TYLENOL) 650 MG CR tablet Take 650 mg by mouth 4 (four) times daily.    . diphenhydrAMINE (BENADRYL) 25 MG tablet Take 25 mg by mouth as needed for allergies.     . fluticasone (FLONASE ALLERGY RELIEF) 50 MCG/ACT nasal spray Place 2 sprays into both nostrils as needed for allergies or rhinitis.    . Homeopathic Products (ARNICA) GEL Apply 1 application topically as needed. Arnica Motanna GEL    . levocetirizine (XYZAL) 5 MG tablet Take 5 mg by mouth every evening.    Marland Kitchen losartan (COZAAR) 50 MG tablet Take 50 mg by mouth daily.    . Olopatadine HCl (PATADAY) 0.2 % SOLN Apply 1 drop to eye daily as needed (irritation).     . polyethylene glycol-electrolytes (NULYTELY/GOLYTELY) 420 G solution Take 4,000 mLs by mouth once. 4000 mL 0   . ranitidine (ZANTAC) 75 MG tablet Take 75 mg by mouth as needed for heartburn.    Marland Kitchen rOPINIRole (REQUIP) 1 MG tablet Take 1 mg by mouth every 12 (twelve) hours.    . sodium chloride (MURO 128) 5 % ophthalmic solution Place 1 drop into both eyes as needed for irritation.    Marland Kitchen testosterone cypionate (DEPOTESTOTERONE CYPIONATE) 200 MG/ML injection Inject 200 mg into the muscle every 21 ( twenty-one) days.     . traMADol (ULTRAM) 50 MG tablet Take 50 mg by mouth 4 (four) times daily as needed for moderate pain.     Marland Kitchen triazolam (HALCION) 0.25 MG tablet Take 0.25 mg by mouth at bedtime.    . trolamine salicylate (ASPERCREME) 10 % cream Apply 1 application topically as needed for muscle pain.      No results found for this or any previous visit (from the past 48 hour(s)). No results found.  ROS  Blood pressure 135/83, pulse 77, temperature 97.6 F (36.4 C), temperature source Oral, resp. rate 18, height 6' (1.829 m), weight 206 lb (93.441 kg), SpO2 98 %. Physical Exam  Constitutional: He appears well-developed and well-nourished.  HENT:  Mouth/Throat: Oropharynx is clear and moist.  Eyes: Conjunctivae are normal. No scleral icterus.  Neck: No thyromegaly present.  Cardiovascular: Normal rate, regular rhythm and normal  heart sounds.   No murmur heard. Respiratory: Effort normal and breath sounds normal.  GI: Soft. He exhibits no distension and no mass. There is no tenderness.  Musculoskeletal: He exhibits no edema.  Lymphadenopathy:    He has no cervical adenopathy.  Neurological: He is alert.  Skin: Skin is warm and dry.     Assessment/Plan Average risk screening colonoscopy.  Lilliana Turner U 01/27/2015, 9:29 AM

## 2015-01-31 ENCOUNTER — Encounter (HOSPITAL_COMMUNITY): Payer: Self-pay | Admitting: Internal Medicine

## 2015-01-31 DIAGNOSIS — J3089 Other allergic rhinitis: Secondary | ICD-10-CM | POA: Diagnosis not present

## 2015-01-31 DIAGNOSIS — J3081 Allergic rhinitis due to animal (cat) (dog) hair and dander: Secondary | ICD-10-CM | POA: Diagnosis not present

## 2015-01-31 DIAGNOSIS — J301 Allergic rhinitis due to pollen: Secondary | ICD-10-CM | POA: Diagnosis not present

## 2015-02-02 DIAGNOSIS — J3081 Allergic rhinitis due to animal (cat) (dog) hair and dander: Secondary | ICD-10-CM | POA: Diagnosis not present

## 2015-02-02 DIAGNOSIS — J3089 Other allergic rhinitis: Secondary | ICD-10-CM | POA: Diagnosis not present

## 2015-02-02 DIAGNOSIS — J301 Allergic rhinitis due to pollen: Secondary | ICD-10-CM | POA: Diagnosis not present

## 2015-02-07 DIAGNOSIS — J3081 Allergic rhinitis due to animal (cat) (dog) hair and dander: Secondary | ICD-10-CM | POA: Diagnosis not present

## 2015-02-07 DIAGNOSIS — J301 Allergic rhinitis due to pollen: Secondary | ICD-10-CM | POA: Diagnosis not present

## 2015-02-07 DIAGNOSIS — J3089 Other allergic rhinitis: Secondary | ICD-10-CM | POA: Diagnosis not present

## 2015-02-08 DIAGNOSIS — E291 Testicular hypofunction: Secondary | ICD-10-CM | POA: Diagnosis not present

## 2015-02-23 DIAGNOSIS — J3081 Allergic rhinitis due to animal (cat) (dog) hair and dander: Secondary | ICD-10-CM | POA: Diagnosis not present

## 2015-02-23 DIAGNOSIS — J3089 Other allergic rhinitis: Secondary | ICD-10-CM | POA: Diagnosis not present

## 2015-02-23 DIAGNOSIS — J301 Allergic rhinitis due to pollen: Secondary | ICD-10-CM | POA: Diagnosis not present

## 2015-02-25 DIAGNOSIS — J301 Allergic rhinitis due to pollen: Secondary | ICD-10-CM | POA: Diagnosis not present

## 2015-02-25 DIAGNOSIS — J3081 Allergic rhinitis due to animal (cat) (dog) hair and dander: Secondary | ICD-10-CM | POA: Diagnosis not present

## 2015-02-25 DIAGNOSIS — J3089 Other allergic rhinitis: Secondary | ICD-10-CM | POA: Diagnosis not present

## 2015-02-28 DIAGNOSIS — J3089 Other allergic rhinitis: Secondary | ICD-10-CM | POA: Diagnosis not present

## 2015-02-28 DIAGNOSIS — J3081 Allergic rhinitis due to animal (cat) (dog) hair and dander: Secondary | ICD-10-CM | POA: Diagnosis not present

## 2015-02-28 DIAGNOSIS — J301 Allergic rhinitis due to pollen: Secondary | ICD-10-CM | POA: Diagnosis not present

## 2015-03-02 DIAGNOSIS — J3089 Other allergic rhinitis: Secondary | ICD-10-CM | POA: Diagnosis not present

## 2015-03-02 DIAGNOSIS — J3081 Allergic rhinitis due to animal (cat) (dog) hair and dander: Secondary | ICD-10-CM | POA: Diagnosis not present

## 2015-03-02 DIAGNOSIS — Z23 Encounter for immunization: Secondary | ICD-10-CM | POA: Diagnosis not present

## 2015-03-02 DIAGNOSIS — E291 Testicular hypofunction: Secondary | ICD-10-CM | POA: Diagnosis not present

## 2015-03-04 DIAGNOSIS — R7989 Other specified abnormal findings of blood chemistry: Secondary | ICD-10-CM | POA: Diagnosis not present

## 2015-03-04 DIAGNOSIS — Z125 Encounter for screening for malignant neoplasm of prostate: Secondary | ICD-10-CM | POA: Diagnosis not present

## 2015-03-04 DIAGNOSIS — E538 Deficiency of other specified B group vitamins: Secondary | ICD-10-CM | POA: Diagnosis not present

## 2015-03-04 DIAGNOSIS — I10 Essential (primary) hypertension: Secondary | ICD-10-CM | POA: Diagnosis not present

## 2015-03-04 DIAGNOSIS — E785 Hyperlipidemia, unspecified: Secondary | ICD-10-CM | POA: Diagnosis not present

## 2015-03-04 DIAGNOSIS — E782 Mixed hyperlipidemia: Secondary | ICD-10-CM | POA: Diagnosis not present

## 2015-03-04 DIAGNOSIS — G2581 Restless legs syndrome: Secondary | ICD-10-CM | POA: Diagnosis not present

## 2015-03-04 DIAGNOSIS — G4730 Sleep apnea, unspecified: Secondary | ICD-10-CM | POA: Diagnosis not present

## 2015-03-04 DIAGNOSIS — Z1389 Encounter for screening for other disorder: Secondary | ICD-10-CM | POA: Diagnosis not present

## 2015-03-04 DIAGNOSIS — Z0000 Encounter for general adult medical examination without abnormal findings: Secondary | ICD-10-CM | POA: Diagnosis not present

## 2015-03-07 DIAGNOSIS — J301 Allergic rhinitis due to pollen: Secondary | ICD-10-CM | POA: Diagnosis not present

## 2015-03-07 DIAGNOSIS — J3089 Other allergic rhinitis: Secondary | ICD-10-CM | POA: Diagnosis not present

## 2015-03-07 DIAGNOSIS — J3081 Allergic rhinitis due to animal (cat) (dog) hair and dander: Secondary | ICD-10-CM | POA: Diagnosis not present

## 2015-03-14 DIAGNOSIS — J301 Allergic rhinitis due to pollen: Secondary | ICD-10-CM | POA: Diagnosis not present

## 2015-03-14 DIAGNOSIS — J3081 Allergic rhinitis due to animal (cat) (dog) hair and dander: Secondary | ICD-10-CM | POA: Diagnosis not present

## 2015-03-14 DIAGNOSIS — J3089 Other allergic rhinitis: Secondary | ICD-10-CM | POA: Diagnosis not present

## 2015-03-21 DIAGNOSIS — J3081 Allergic rhinitis due to animal (cat) (dog) hair and dander: Secondary | ICD-10-CM | POA: Diagnosis not present

## 2015-03-21 DIAGNOSIS — J3089 Other allergic rhinitis: Secondary | ICD-10-CM | POA: Diagnosis not present

## 2015-03-21 DIAGNOSIS — J301 Allergic rhinitis due to pollen: Secondary | ICD-10-CM | POA: Diagnosis not present

## 2015-03-23 DIAGNOSIS — Z1389 Encounter for screening for other disorder: Secondary | ICD-10-CM | POA: Diagnosis not present

## 2015-03-23 DIAGNOSIS — E559 Vitamin D deficiency, unspecified: Secondary | ICD-10-CM | POA: Diagnosis not present

## 2015-03-23 DIAGNOSIS — G2581 Restless legs syndrome: Secondary | ICD-10-CM | POA: Diagnosis not present

## 2015-03-23 DIAGNOSIS — E538 Deficiency of other specified B group vitamins: Secondary | ICD-10-CM | POA: Diagnosis not present

## 2015-03-23 DIAGNOSIS — G4730 Sleep apnea, unspecified: Secondary | ICD-10-CM | POA: Diagnosis not present

## 2015-03-23 DIAGNOSIS — R7989 Other specified abnormal findings of blood chemistry: Secondary | ICD-10-CM | POA: Diagnosis not present

## 2015-03-23 DIAGNOSIS — I10 Essential (primary) hypertension: Secondary | ICD-10-CM | POA: Diagnosis not present

## 2015-03-23 DIAGNOSIS — E785 Hyperlipidemia, unspecified: Secondary | ICD-10-CM | POA: Diagnosis not present

## 2015-03-23 DIAGNOSIS — Z125 Encounter for screening for malignant neoplasm of prostate: Secondary | ICD-10-CM | POA: Diagnosis not present

## 2015-03-28 DIAGNOSIS — J3089 Other allergic rhinitis: Secondary | ICD-10-CM | POA: Diagnosis not present

## 2015-03-28 DIAGNOSIS — J301 Allergic rhinitis due to pollen: Secondary | ICD-10-CM | POA: Diagnosis not present

## 2015-03-28 DIAGNOSIS — J3081 Allergic rhinitis due to animal (cat) (dog) hair and dander: Secondary | ICD-10-CM | POA: Diagnosis not present

## 2015-04-06 DIAGNOSIS — J301 Allergic rhinitis due to pollen: Secondary | ICD-10-CM | POA: Diagnosis not present

## 2015-04-06 DIAGNOSIS — J3081 Allergic rhinitis due to animal (cat) (dog) hair and dander: Secondary | ICD-10-CM | POA: Diagnosis not present

## 2015-04-06 DIAGNOSIS — J3089 Other allergic rhinitis: Secondary | ICD-10-CM | POA: Diagnosis not present

## 2015-04-11 DIAGNOSIS — J3081 Allergic rhinitis due to animal (cat) (dog) hair and dander: Secondary | ICD-10-CM | POA: Diagnosis not present

## 2015-04-11 DIAGNOSIS — J301 Allergic rhinitis due to pollen: Secondary | ICD-10-CM | POA: Diagnosis not present

## 2015-04-11 DIAGNOSIS — J3089 Other allergic rhinitis: Secondary | ICD-10-CM | POA: Diagnosis not present

## 2015-04-18 DIAGNOSIS — J3081 Allergic rhinitis due to animal (cat) (dog) hair and dander: Secondary | ICD-10-CM | POA: Diagnosis not present

## 2015-04-18 DIAGNOSIS — J3089 Other allergic rhinitis: Secondary | ICD-10-CM | POA: Diagnosis not present

## 2015-04-18 DIAGNOSIS — J301 Allergic rhinitis due to pollen: Secondary | ICD-10-CM | POA: Diagnosis not present

## 2015-04-25 DIAGNOSIS — J3089 Other allergic rhinitis: Secondary | ICD-10-CM | POA: Diagnosis not present

## 2015-04-25 DIAGNOSIS — J3081 Allergic rhinitis due to animal (cat) (dog) hair and dander: Secondary | ICD-10-CM | POA: Diagnosis not present

## 2015-04-25 DIAGNOSIS — J301 Allergic rhinitis due to pollen: Secondary | ICD-10-CM | POA: Diagnosis not present

## 2015-05-02 DIAGNOSIS — J3089 Other allergic rhinitis: Secondary | ICD-10-CM | POA: Diagnosis not present

## 2015-05-02 DIAGNOSIS — J3081 Allergic rhinitis due to animal (cat) (dog) hair and dander: Secondary | ICD-10-CM | POA: Diagnosis not present

## 2015-05-02 DIAGNOSIS — J301 Allergic rhinitis due to pollen: Secondary | ICD-10-CM | POA: Diagnosis not present

## 2015-05-09 DIAGNOSIS — J3081 Allergic rhinitis due to animal (cat) (dog) hair and dander: Secondary | ICD-10-CM | POA: Diagnosis not present

## 2015-05-09 DIAGNOSIS — J301 Allergic rhinitis due to pollen: Secondary | ICD-10-CM | POA: Diagnosis not present

## 2015-05-09 DIAGNOSIS — J3089 Other allergic rhinitis: Secondary | ICD-10-CM | POA: Diagnosis not present

## 2015-05-16 DIAGNOSIS — J3081 Allergic rhinitis due to animal (cat) (dog) hair and dander: Secondary | ICD-10-CM | POA: Diagnosis not present

## 2015-05-16 DIAGNOSIS — J301 Allergic rhinitis due to pollen: Secondary | ICD-10-CM | POA: Diagnosis not present

## 2015-05-16 DIAGNOSIS — J3089 Other allergic rhinitis: Secondary | ICD-10-CM | POA: Diagnosis not present

## 2015-05-23 DIAGNOSIS — J3089 Other allergic rhinitis: Secondary | ICD-10-CM | POA: Diagnosis not present

## 2015-05-23 DIAGNOSIS — J3081 Allergic rhinitis due to animal (cat) (dog) hair and dander: Secondary | ICD-10-CM | POA: Diagnosis not present

## 2015-05-23 DIAGNOSIS — J301 Allergic rhinitis due to pollen: Secondary | ICD-10-CM | POA: Diagnosis not present

## 2015-05-30 DIAGNOSIS — J3081 Allergic rhinitis due to animal (cat) (dog) hair and dander: Secondary | ICD-10-CM | POA: Diagnosis not present

## 2015-05-30 DIAGNOSIS — J301 Allergic rhinitis due to pollen: Secondary | ICD-10-CM | POA: Diagnosis not present

## 2015-05-30 DIAGNOSIS — J3089 Other allergic rhinitis: Secondary | ICD-10-CM | POA: Diagnosis not present

## 2015-06-08 DIAGNOSIS — J301 Allergic rhinitis due to pollen: Secondary | ICD-10-CM | POA: Diagnosis not present

## 2015-06-08 DIAGNOSIS — J3089 Other allergic rhinitis: Secondary | ICD-10-CM | POA: Diagnosis not present

## 2015-06-08 DIAGNOSIS — J3081 Allergic rhinitis due to animal (cat) (dog) hair and dander: Secondary | ICD-10-CM | POA: Diagnosis not present

## 2015-06-15 DIAGNOSIS — J3081 Allergic rhinitis due to animal (cat) (dog) hair and dander: Secondary | ICD-10-CM | POA: Diagnosis not present

## 2015-06-15 DIAGNOSIS — J301 Allergic rhinitis due to pollen: Secondary | ICD-10-CM | POA: Diagnosis not present

## 2015-06-15 DIAGNOSIS — J3089 Other allergic rhinitis: Secondary | ICD-10-CM | POA: Diagnosis not present

## 2015-06-16 DIAGNOSIS — M797 Fibromyalgia: Secondary | ICD-10-CM | POA: Diagnosis not present

## 2015-06-16 DIAGNOSIS — E663 Overweight: Secondary | ICD-10-CM | POA: Diagnosis not present

## 2015-06-16 DIAGNOSIS — Z1389 Encounter for screening for other disorder: Secondary | ICD-10-CM | POA: Diagnosis not present

## 2015-06-16 DIAGNOSIS — J309 Allergic rhinitis, unspecified: Secondary | ICD-10-CM | POA: Diagnosis not present

## 2015-06-16 DIAGNOSIS — Z6828 Body mass index (BMI) 28.0-28.9, adult: Secondary | ICD-10-CM | POA: Diagnosis not present

## 2015-06-22 DIAGNOSIS — J301 Allergic rhinitis due to pollen: Secondary | ICD-10-CM | POA: Diagnosis not present

## 2015-06-22 DIAGNOSIS — J3081 Allergic rhinitis due to animal (cat) (dog) hair and dander: Secondary | ICD-10-CM | POA: Diagnosis not present

## 2015-06-22 DIAGNOSIS — J3089 Other allergic rhinitis: Secondary | ICD-10-CM | POA: Diagnosis not present

## 2015-06-23 DIAGNOSIS — J3089 Other allergic rhinitis: Secondary | ICD-10-CM | POA: Diagnosis not present

## 2015-06-23 DIAGNOSIS — J3081 Allergic rhinitis due to animal (cat) (dog) hair and dander: Secondary | ICD-10-CM | POA: Diagnosis not present

## 2015-06-23 DIAGNOSIS — J301 Allergic rhinitis due to pollen: Secondary | ICD-10-CM | POA: Diagnosis not present

## 2015-06-28 DIAGNOSIS — J3081 Allergic rhinitis due to animal (cat) (dog) hair and dander: Secondary | ICD-10-CM | POA: Diagnosis not present

## 2015-06-28 DIAGNOSIS — J3089 Other allergic rhinitis: Secondary | ICD-10-CM | POA: Diagnosis not present

## 2015-06-28 DIAGNOSIS — J301 Allergic rhinitis due to pollen: Secondary | ICD-10-CM | POA: Diagnosis not present

## 2015-07-04 DIAGNOSIS — J301 Allergic rhinitis due to pollen: Secondary | ICD-10-CM | POA: Diagnosis not present

## 2015-07-04 DIAGNOSIS — J3089 Other allergic rhinitis: Secondary | ICD-10-CM | POA: Diagnosis not present

## 2015-07-18 DIAGNOSIS — Z1389 Encounter for screening for other disorder: Secondary | ICD-10-CM | POA: Diagnosis not present

## 2015-07-18 DIAGNOSIS — I499 Cardiac arrhythmia, unspecified: Secondary | ICD-10-CM | POA: Diagnosis not present

## 2015-07-18 DIAGNOSIS — Z681 Body mass index (BMI) 19 or less, adult: Secondary | ICD-10-CM | POA: Diagnosis not present

## 2015-07-18 DIAGNOSIS — J209 Acute bronchitis, unspecified: Secondary | ICD-10-CM | POA: Diagnosis not present

## 2015-07-20 DIAGNOSIS — J3081 Allergic rhinitis due to animal (cat) (dog) hair and dander: Secondary | ICD-10-CM | POA: Diagnosis not present

## 2015-07-20 DIAGNOSIS — J301 Allergic rhinitis due to pollen: Secondary | ICD-10-CM | POA: Diagnosis not present

## 2015-07-20 DIAGNOSIS — J3089 Other allergic rhinitis: Secondary | ICD-10-CM | POA: Diagnosis not present

## 2015-07-25 DIAGNOSIS — D485 Neoplasm of uncertain behavior of skin: Secondary | ICD-10-CM | POA: Diagnosis not present

## 2015-07-25 DIAGNOSIS — J3081 Allergic rhinitis due to animal (cat) (dog) hair and dander: Secondary | ICD-10-CM | POA: Diagnosis not present

## 2015-07-25 DIAGNOSIS — D239 Other benign neoplasm of skin, unspecified: Secondary | ICD-10-CM | POA: Diagnosis not present

## 2015-07-25 DIAGNOSIS — L570 Actinic keratosis: Secondary | ICD-10-CM | POA: Diagnosis not present

## 2015-07-25 DIAGNOSIS — J3089 Other allergic rhinitis: Secondary | ICD-10-CM | POA: Diagnosis not present

## 2015-07-25 DIAGNOSIS — L820 Inflamed seborrheic keratosis: Secondary | ICD-10-CM | POA: Diagnosis not present

## 2015-07-25 DIAGNOSIS — J301 Allergic rhinitis due to pollen: Secondary | ICD-10-CM | POA: Diagnosis not present

## 2015-08-01 DIAGNOSIS — J301 Allergic rhinitis due to pollen: Secondary | ICD-10-CM | POA: Diagnosis not present

## 2015-08-01 DIAGNOSIS — J3089 Other allergic rhinitis: Secondary | ICD-10-CM | POA: Diagnosis not present

## 2015-08-01 DIAGNOSIS — J3081 Allergic rhinitis due to animal (cat) (dog) hair and dander: Secondary | ICD-10-CM | POA: Diagnosis not present

## 2015-08-03 DIAGNOSIS — J3089 Other allergic rhinitis: Secondary | ICD-10-CM | POA: Diagnosis not present

## 2015-08-03 DIAGNOSIS — J3081 Allergic rhinitis due to animal (cat) (dog) hair and dander: Secondary | ICD-10-CM | POA: Diagnosis not present

## 2015-08-03 DIAGNOSIS — J301 Allergic rhinitis due to pollen: Secondary | ICD-10-CM | POA: Diagnosis not present

## 2015-08-08 DIAGNOSIS — J3081 Allergic rhinitis due to animal (cat) (dog) hair and dander: Secondary | ICD-10-CM | POA: Diagnosis not present

## 2015-08-08 DIAGNOSIS — J301 Allergic rhinitis due to pollen: Secondary | ICD-10-CM | POA: Diagnosis not present

## 2015-08-08 DIAGNOSIS — J3089 Other allergic rhinitis: Secondary | ICD-10-CM | POA: Diagnosis not present

## 2015-08-12 DIAGNOSIS — J3089 Other allergic rhinitis: Secondary | ICD-10-CM | POA: Diagnosis not present

## 2015-08-12 DIAGNOSIS — E291 Testicular hypofunction: Secondary | ICD-10-CM | POA: Diagnosis not present

## 2015-08-12 DIAGNOSIS — Z1389 Encounter for screening for other disorder: Secondary | ICD-10-CM | POA: Diagnosis not present

## 2015-08-12 DIAGNOSIS — G4701 Insomnia due to medical condition: Secondary | ICD-10-CM | POA: Diagnosis not present

## 2015-08-12 DIAGNOSIS — J3081 Allergic rhinitis due to animal (cat) (dog) hair and dander: Secondary | ICD-10-CM | POA: Diagnosis not present

## 2015-08-12 DIAGNOSIS — J301 Allergic rhinitis due to pollen: Secondary | ICD-10-CM | POA: Diagnosis not present

## 2015-08-12 DIAGNOSIS — Z6826 Body mass index (BMI) 26.0-26.9, adult: Secondary | ICD-10-CM | POA: Diagnosis not present

## 2015-08-12 DIAGNOSIS — K589 Irritable bowel syndrome without diarrhea: Secondary | ICD-10-CM | POA: Diagnosis not present

## 2015-08-15 DIAGNOSIS — J301 Allergic rhinitis due to pollen: Secondary | ICD-10-CM | POA: Diagnosis not present

## 2015-08-15 DIAGNOSIS — J3081 Allergic rhinitis due to animal (cat) (dog) hair and dander: Secondary | ICD-10-CM | POA: Diagnosis not present

## 2015-08-15 DIAGNOSIS — J3089 Other allergic rhinitis: Secondary | ICD-10-CM | POA: Diagnosis not present

## 2015-08-22 DIAGNOSIS — J3081 Allergic rhinitis due to animal (cat) (dog) hair and dander: Secondary | ICD-10-CM | POA: Diagnosis not present

## 2015-08-22 DIAGNOSIS — J301 Allergic rhinitis due to pollen: Secondary | ICD-10-CM | POA: Diagnosis not present

## 2015-08-22 DIAGNOSIS — J3089 Other allergic rhinitis: Secondary | ICD-10-CM | POA: Diagnosis not present

## 2015-08-26 DIAGNOSIS — H43812 Vitreous degeneration, left eye: Secondary | ICD-10-CM | POA: Diagnosis not present

## 2015-08-29 DIAGNOSIS — J3081 Allergic rhinitis due to animal (cat) (dog) hair and dander: Secondary | ICD-10-CM | POA: Diagnosis not present

## 2015-08-29 DIAGNOSIS — J3089 Other allergic rhinitis: Secondary | ICD-10-CM | POA: Diagnosis not present

## 2015-08-29 DIAGNOSIS — J301 Allergic rhinitis due to pollen: Secondary | ICD-10-CM | POA: Diagnosis not present

## 2015-09-02 DIAGNOSIS — H43812 Vitreous degeneration, left eye: Secondary | ICD-10-CM | POA: Diagnosis not present

## 2015-09-05 DIAGNOSIS — J3089 Other allergic rhinitis: Secondary | ICD-10-CM | POA: Diagnosis not present

## 2015-09-05 DIAGNOSIS — J3081 Allergic rhinitis due to animal (cat) (dog) hair and dander: Secondary | ICD-10-CM | POA: Diagnosis not present

## 2015-09-05 DIAGNOSIS — J301 Allergic rhinitis due to pollen: Secondary | ICD-10-CM | POA: Diagnosis not present

## 2015-09-12 DIAGNOSIS — J301 Allergic rhinitis due to pollen: Secondary | ICD-10-CM | POA: Diagnosis not present

## 2015-09-12 DIAGNOSIS — J3081 Allergic rhinitis due to animal (cat) (dog) hair and dander: Secondary | ICD-10-CM | POA: Diagnosis not present

## 2015-09-12 DIAGNOSIS — J3089 Other allergic rhinitis: Secondary | ICD-10-CM | POA: Diagnosis not present

## 2015-09-19 DIAGNOSIS — J3089 Other allergic rhinitis: Secondary | ICD-10-CM | POA: Diagnosis not present

## 2015-09-19 DIAGNOSIS — J301 Allergic rhinitis due to pollen: Secondary | ICD-10-CM | POA: Diagnosis not present

## 2015-09-19 DIAGNOSIS — J3081 Allergic rhinitis due to animal (cat) (dog) hair and dander: Secondary | ICD-10-CM | POA: Diagnosis not present

## 2015-09-23 DIAGNOSIS — J3081 Allergic rhinitis due to animal (cat) (dog) hair and dander: Secondary | ICD-10-CM | POA: Diagnosis not present

## 2015-09-23 DIAGNOSIS — J3089 Other allergic rhinitis: Secondary | ICD-10-CM | POA: Diagnosis not present

## 2015-09-23 DIAGNOSIS — H1045 Other chronic allergic conjunctivitis: Secondary | ICD-10-CM | POA: Diagnosis not present

## 2015-09-23 DIAGNOSIS — J301 Allergic rhinitis due to pollen: Secondary | ICD-10-CM | POA: Diagnosis not present

## 2015-09-26 DIAGNOSIS — J3089 Other allergic rhinitis: Secondary | ICD-10-CM | POA: Diagnosis not present

## 2015-09-26 DIAGNOSIS — J301 Allergic rhinitis due to pollen: Secondary | ICD-10-CM | POA: Diagnosis not present

## 2015-09-26 DIAGNOSIS — J3081 Allergic rhinitis due to animal (cat) (dog) hair and dander: Secondary | ICD-10-CM | POA: Diagnosis not present

## 2015-10-03 DIAGNOSIS — J3089 Other allergic rhinitis: Secondary | ICD-10-CM | POA: Diagnosis not present

## 2015-10-03 DIAGNOSIS — J301 Allergic rhinitis due to pollen: Secondary | ICD-10-CM | POA: Diagnosis not present

## 2015-10-03 DIAGNOSIS — J3081 Allergic rhinitis due to animal (cat) (dog) hair and dander: Secondary | ICD-10-CM | POA: Diagnosis not present

## 2015-10-12 DIAGNOSIS — J301 Allergic rhinitis due to pollen: Secondary | ICD-10-CM | POA: Diagnosis not present

## 2015-10-12 DIAGNOSIS — J3089 Other allergic rhinitis: Secondary | ICD-10-CM | POA: Diagnosis not present

## 2015-10-12 DIAGNOSIS — J3081 Allergic rhinitis due to animal (cat) (dog) hair and dander: Secondary | ICD-10-CM | POA: Diagnosis not present

## 2015-10-17 DIAGNOSIS — J3081 Allergic rhinitis due to animal (cat) (dog) hair and dander: Secondary | ICD-10-CM | POA: Diagnosis not present

## 2015-10-17 DIAGNOSIS — J301 Allergic rhinitis due to pollen: Secondary | ICD-10-CM | POA: Diagnosis not present

## 2015-10-17 DIAGNOSIS — J3089 Other allergic rhinitis: Secondary | ICD-10-CM | POA: Diagnosis not present

## 2015-10-24 DIAGNOSIS — J301 Allergic rhinitis due to pollen: Secondary | ICD-10-CM | POA: Diagnosis not present

## 2015-10-24 DIAGNOSIS — J3081 Allergic rhinitis due to animal (cat) (dog) hair and dander: Secondary | ICD-10-CM | POA: Diagnosis not present

## 2015-10-24 DIAGNOSIS — J3089 Other allergic rhinitis: Secondary | ICD-10-CM | POA: Diagnosis not present

## 2015-10-31 DIAGNOSIS — J3089 Other allergic rhinitis: Secondary | ICD-10-CM | POA: Diagnosis not present

## 2015-10-31 DIAGNOSIS — J3081 Allergic rhinitis due to animal (cat) (dog) hair and dander: Secondary | ICD-10-CM | POA: Diagnosis not present

## 2015-10-31 DIAGNOSIS — J301 Allergic rhinitis due to pollen: Secondary | ICD-10-CM | POA: Diagnosis not present

## 2015-11-07 DIAGNOSIS — J301 Allergic rhinitis due to pollen: Secondary | ICD-10-CM | POA: Diagnosis not present

## 2015-11-07 DIAGNOSIS — J3081 Allergic rhinitis due to animal (cat) (dog) hair and dander: Secondary | ICD-10-CM | POA: Diagnosis not present

## 2015-11-07 DIAGNOSIS — J3089 Other allergic rhinitis: Secondary | ICD-10-CM | POA: Diagnosis not present

## 2015-11-16 DIAGNOSIS — J3081 Allergic rhinitis due to animal (cat) (dog) hair and dander: Secondary | ICD-10-CM | POA: Diagnosis not present

## 2015-11-16 DIAGNOSIS — J301 Allergic rhinitis due to pollen: Secondary | ICD-10-CM | POA: Diagnosis not present

## 2015-11-16 DIAGNOSIS — J3089 Other allergic rhinitis: Secondary | ICD-10-CM | POA: Diagnosis not present

## 2015-11-23 DIAGNOSIS — J301 Allergic rhinitis due to pollen: Secondary | ICD-10-CM | POA: Diagnosis not present

## 2015-11-23 DIAGNOSIS — J3081 Allergic rhinitis due to animal (cat) (dog) hair and dander: Secondary | ICD-10-CM | POA: Diagnosis not present

## 2015-11-23 DIAGNOSIS — J3089 Other allergic rhinitis: Secondary | ICD-10-CM | POA: Diagnosis not present

## 2015-11-28 DIAGNOSIS — J301 Allergic rhinitis due to pollen: Secondary | ICD-10-CM | POA: Diagnosis not present

## 2015-11-28 DIAGNOSIS — J3081 Allergic rhinitis due to animal (cat) (dog) hair and dander: Secondary | ICD-10-CM | POA: Diagnosis not present

## 2015-11-28 DIAGNOSIS — J3089 Other allergic rhinitis: Secondary | ICD-10-CM | POA: Diagnosis not present

## 2015-12-01 DIAGNOSIS — J301 Allergic rhinitis due to pollen: Secondary | ICD-10-CM | POA: Diagnosis not present

## 2015-12-01 DIAGNOSIS — J3081 Allergic rhinitis due to animal (cat) (dog) hair and dander: Secondary | ICD-10-CM | POA: Diagnosis not present

## 2015-12-01 DIAGNOSIS — J3089 Other allergic rhinitis: Secondary | ICD-10-CM | POA: Diagnosis not present

## 2015-12-07 DIAGNOSIS — J3089 Other allergic rhinitis: Secondary | ICD-10-CM | POA: Diagnosis not present

## 2015-12-07 DIAGNOSIS — J301 Allergic rhinitis due to pollen: Secondary | ICD-10-CM | POA: Diagnosis not present

## 2015-12-07 DIAGNOSIS — J3081 Allergic rhinitis due to animal (cat) (dog) hair and dander: Secondary | ICD-10-CM | POA: Diagnosis not present

## 2015-12-09 DIAGNOSIS — J301 Allergic rhinitis due to pollen: Secondary | ICD-10-CM | POA: Diagnosis not present

## 2015-12-09 DIAGNOSIS — J3089 Other allergic rhinitis: Secondary | ICD-10-CM | POA: Diagnosis not present

## 2015-12-09 DIAGNOSIS — J3081 Allergic rhinitis due to animal (cat) (dog) hair and dander: Secondary | ICD-10-CM | POA: Diagnosis not present

## 2015-12-14 DIAGNOSIS — J3081 Allergic rhinitis due to animal (cat) (dog) hair and dander: Secondary | ICD-10-CM | POA: Diagnosis not present

## 2015-12-14 DIAGNOSIS — J3089 Other allergic rhinitis: Secondary | ICD-10-CM | POA: Diagnosis not present

## 2015-12-14 DIAGNOSIS — J301 Allergic rhinitis due to pollen: Secondary | ICD-10-CM | POA: Diagnosis not present

## 2015-12-16 DIAGNOSIS — J301 Allergic rhinitis due to pollen: Secondary | ICD-10-CM | POA: Diagnosis not present

## 2015-12-16 DIAGNOSIS — J3081 Allergic rhinitis due to animal (cat) (dog) hair and dander: Secondary | ICD-10-CM | POA: Diagnosis not present

## 2015-12-16 DIAGNOSIS — J3089 Other allergic rhinitis: Secondary | ICD-10-CM | POA: Diagnosis not present

## 2015-12-21 DIAGNOSIS — J3089 Other allergic rhinitis: Secondary | ICD-10-CM | POA: Diagnosis not present

## 2015-12-21 DIAGNOSIS — J3081 Allergic rhinitis due to animal (cat) (dog) hair and dander: Secondary | ICD-10-CM | POA: Diagnosis not present

## 2015-12-21 DIAGNOSIS — J301 Allergic rhinitis due to pollen: Secondary | ICD-10-CM | POA: Diagnosis not present

## 2015-12-28 DIAGNOSIS — J301 Allergic rhinitis due to pollen: Secondary | ICD-10-CM | POA: Diagnosis not present

## 2015-12-28 DIAGNOSIS — J3089 Other allergic rhinitis: Secondary | ICD-10-CM | POA: Diagnosis not present

## 2015-12-28 DIAGNOSIS — J3081 Allergic rhinitis due to animal (cat) (dog) hair and dander: Secondary | ICD-10-CM | POA: Diagnosis not present

## 2016-01-02 DIAGNOSIS — J301 Allergic rhinitis due to pollen: Secondary | ICD-10-CM | POA: Diagnosis not present

## 2016-01-02 DIAGNOSIS — J3089 Other allergic rhinitis: Secondary | ICD-10-CM | POA: Diagnosis not present

## 2016-01-02 DIAGNOSIS — J3081 Allergic rhinitis due to animal (cat) (dog) hair and dander: Secondary | ICD-10-CM | POA: Diagnosis not present

## 2016-01-09 DIAGNOSIS — J301 Allergic rhinitis due to pollen: Secondary | ICD-10-CM | POA: Diagnosis not present

## 2016-01-09 DIAGNOSIS — J3081 Allergic rhinitis due to animal (cat) (dog) hair and dander: Secondary | ICD-10-CM | POA: Diagnosis not present

## 2016-01-09 DIAGNOSIS — J3089 Other allergic rhinitis: Secondary | ICD-10-CM | POA: Diagnosis not present

## 2016-01-18 DIAGNOSIS — J3089 Other allergic rhinitis: Secondary | ICD-10-CM | POA: Diagnosis not present

## 2016-01-18 DIAGNOSIS — J3081 Allergic rhinitis due to animal (cat) (dog) hair and dander: Secondary | ICD-10-CM | POA: Diagnosis not present

## 2016-01-18 DIAGNOSIS — J301 Allergic rhinitis due to pollen: Secondary | ICD-10-CM | POA: Diagnosis not present

## 2016-01-25 DIAGNOSIS — J3081 Allergic rhinitis due to animal (cat) (dog) hair and dander: Secondary | ICD-10-CM | POA: Diagnosis not present

## 2016-01-25 DIAGNOSIS — J3089 Other allergic rhinitis: Secondary | ICD-10-CM | POA: Diagnosis not present

## 2016-01-25 DIAGNOSIS — J301 Allergic rhinitis due to pollen: Secondary | ICD-10-CM | POA: Diagnosis not present

## 2016-02-01 DIAGNOSIS — J3081 Allergic rhinitis due to animal (cat) (dog) hair and dander: Secondary | ICD-10-CM | POA: Diagnosis not present

## 2016-02-01 DIAGNOSIS — J301 Allergic rhinitis due to pollen: Secondary | ICD-10-CM | POA: Diagnosis not present

## 2016-02-01 DIAGNOSIS — J3089 Other allergic rhinitis: Secondary | ICD-10-CM | POA: Diagnosis not present

## 2016-02-08 DIAGNOSIS — J3089 Other allergic rhinitis: Secondary | ICD-10-CM | POA: Diagnosis not present

## 2016-02-08 DIAGNOSIS — J301 Allergic rhinitis due to pollen: Secondary | ICD-10-CM | POA: Diagnosis not present

## 2016-02-08 DIAGNOSIS — J3081 Allergic rhinitis due to animal (cat) (dog) hair and dander: Secondary | ICD-10-CM | POA: Diagnosis not present

## 2016-02-13 DIAGNOSIS — J3081 Allergic rhinitis due to animal (cat) (dog) hair and dander: Secondary | ICD-10-CM | POA: Diagnosis not present

## 2016-02-13 DIAGNOSIS — J301 Allergic rhinitis due to pollen: Secondary | ICD-10-CM | POA: Diagnosis not present

## 2016-02-13 DIAGNOSIS — J3089 Other allergic rhinitis: Secondary | ICD-10-CM | POA: Diagnosis not present

## 2016-02-24 DIAGNOSIS — J3081 Allergic rhinitis due to animal (cat) (dog) hair and dander: Secondary | ICD-10-CM | POA: Diagnosis not present

## 2016-02-24 DIAGNOSIS — J301 Allergic rhinitis due to pollen: Secondary | ICD-10-CM | POA: Diagnosis not present

## 2016-02-24 DIAGNOSIS — J3089 Other allergic rhinitis: Secondary | ICD-10-CM | POA: Diagnosis not present

## 2016-02-29 DIAGNOSIS — J3089 Other allergic rhinitis: Secondary | ICD-10-CM | POA: Diagnosis not present

## 2016-02-29 DIAGNOSIS — J301 Allergic rhinitis due to pollen: Secondary | ICD-10-CM | POA: Diagnosis not present

## 2016-02-29 DIAGNOSIS — J3081 Allergic rhinitis due to animal (cat) (dog) hair and dander: Secondary | ICD-10-CM | POA: Diagnosis not present

## 2016-03-05 DIAGNOSIS — K219 Gastro-esophageal reflux disease without esophagitis: Secondary | ICD-10-CM | POA: Diagnosis not present

## 2016-03-05 DIAGNOSIS — Z681 Body mass index (BMI) 19 or less, adult: Secondary | ICD-10-CM | POA: Diagnosis not present

## 2016-03-05 DIAGNOSIS — Z1389 Encounter for screening for other disorder: Secondary | ICD-10-CM | POA: Diagnosis not present

## 2016-03-05 DIAGNOSIS — Z0000 Encounter for general adult medical examination without abnormal findings: Secondary | ICD-10-CM | POA: Diagnosis not present

## 2016-03-05 DIAGNOSIS — R7989 Other specified abnormal findings of blood chemistry: Secondary | ICD-10-CM | POA: Diagnosis not present

## 2016-03-05 DIAGNOSIS — Z23 Encounter for immunization: Secondary | ICD-10-CM | POA: Diagnosis not present

## 2016-03-05 DIAGNOSIS — I10 Essential (primary) hypertension: Secondary | ICD-10-CM | POA: Diagnosis not present

## 2016-03-05 DIAGNOSIS — E291 Testicular hypofunction: Secondary | ICD-10-CM | POA: Diagnosis not present

## 2016-03-05 DIAGNOSIS — E785 Hyperlipidemia, unspecified: Secondary | ICD-10-CM | POA: Diagnosis not present

## 2016-03-05 DIAGNOSIS — Z125 Encounter for screening for malignant neoplasm of prostate: Secondary | ICD-10-CM | POA: Diagnosis not present

## 2016-03-07 DIAGNOSIS — J3081 Allergic rhinitis due to animal (cat) (dog) hair and dander: Secondary | ICD-10-CM | POA: Diagnosis not present

## 2016-03-07 DIAGNOSIS — J3089 Other allergic rhinitis: Secondary | ICD-10-CM | POA: Diagnosis not present

## 2016-03-07 DIAGNOSIS — J301 Allergic rhinitis due to pollen: Secondary | ICD-10-CM | POA: Diagnosis not present

## 2016-03-14 DIAGNOSIS — J301 Allergic rhinitis due to pollen: Secondary | ICD-10-CM | POA: Diagnosis not present

## 2016-03-14 DIAGNOSIS — J3089 Other allergic rhinitis: Secondary | ICD-10-CM | POA: Diagnosis not present

## 2016-03-14 DIAGNOSIS — J3081 Allergic rhinitis due to animal (cat) (dog) hair and dander: Secondary | ICD-10-CM | POA: Diagnosis not present

## 2016-03-19 DIAGNOSIS — J301 Allergic rhinitis due to pollen: Secondary | ICD-10-CM | POA: Diagnosis not present

## 2016-03-19 DIAGNOSIS — J3089 Other allergic rhinitis: Secondary | ICD-10-CM | POA: Diagnosis not present

## 2016-03-19 DIAGNOSIS — J3081 Allergic rhinitis due to animal (cat) (dog) hair and dander: Secondary | ICD-10-CM | POA: Diagnosis not present

## 2016-03-26 DIAGNOSIS — H1851 Endothelial corneal dystrophy: Secondary | ICD-10-CM | POA: Diagnosis not present

## 2016-03-26 DIAGNOSIS — H2513 Age-related nuclear cataract, bilateral: Secondary | ICD-10-CM | POA: Diagnosis not present

## 2016-03-28 DIAGNOSIS — J301 Allergic rhinitis due to pollen: Secondary | ICD-10-CM | POA: Diagnosis not present

## 2016-03-28 DIAGNOSIS — J3089 Other allergic rhinitis: Secondary | ICD-10-CM | POA: Diagnosis not present

## 2016-03-28 DIAGNOSIS — J3081 Allergic rhinitis due to animal (cat) (dog) hair and dander: Secondary | ICD-10-CM | POA: Diagnosis not present

## 2016-03-30 ENCOUNTER — Encounter (HOSPITAL_COMMUNITY): Payer: Self-pay | Admitting: Emergency Medicine

## 2016-03-30 ENCOUNTER — Emergency Department (HOSPITAL_COMMUNITY)
Admission: EM | Admit: 2016-03-30 | Discharge: 2016-03-30 | Disposition: A | Discharge status: Home / Self Care | Payer: Commercial Managed Care - HMO | Attending: Emergency Medicine | Admitting: Emergency Medicine

## 2016-03-30 ENCOUNTER — Emergency Department (HOSPITAL_COMMUNITY): Payer: Commercial Managed Care - HMO

## 2016-03-30 DIAGNOSIS — R079 Chest pain, unspecified: Secondary | ICD-10-CM | POA: Diagnosis not present

## 2016-03-30 DIAGNOSIS — I10 Essential (primary) hypertension: Secondary | ICD-10-CM | POA: Diagnosis not present

## 2016-03-30 DIAGNOSIS — Z79899 Other long term (current) drug therapy: Secondary | ICD-10-CM | POA: Diagnosis not present

## 2016-03-30 DIAGNOSIS — Z79891 Long term (current) use of opiate analgesic: Secondary | ICD-10-CM | POA: Insufficient documentation

## 2016-03-30 DIAGNOSIS — R0789 Other chest pain: Secondary | ICD-10-CM | POA: Diagnosis not present

## 2016-03-30 HISTORY — DX: Pure hypercholesterolemia, unspecified: E78.00

## 2016-03-30 LAB — CBC
HCT: 47 % (ref 39.0–52.0)
Hemoglobin: 16.6 g/dL (ref 13.0–17.0)
MCH: 31.3 pg (ref 26.0–34.0)
MCHC: 35.3 g/dL (ref 30.0–36.0)
MCV: 88.5 fL (ref 78.0–100.0)
Platelets: 241 10*3/uL (ref 150–400)
RBC: 5.31 MIL/uL (ref 4.22–5.81)
RDW: 13.3 % (ref 11.5–15.5)
WBC: 8.8 10*3/uL (ref 4.0–10.5)

## 2016-03-30 LAB — BASIC METABOLIC PANEL
Anion gap: 7 (ref 5–15)
BUN: 16 mg/dL (ref 6–20)
CO2: 26 mmol/L (ref 22–32)
Calcium: 9.5 mg/dL (ref 8.9–10.3)
Chloride: 99 mmol/L — ABNORMAL LOW (ref 101–111)
Creatinine, Ser: 1.02 mg/dL (ref 0.61–1.24)
GFR calc Af Amer: >60 mL/min (ref >60)
GFR calc non Af Amer: >60 mL/min (ref >60)
Glucose, Bld: 92 mg/dL (ref 65–99)
Potassium: 3.4 mmol/L — ABNORMAL LOW (ref 3.5–5.1)
Sodium: 132 mmol/L — ABNORMAL LOW (ref 135–145)

## 2016-03-30 LAB — I-STAT TROPONIN, ED
Troponin i, poc: 0 ng/mL (ref 0.00–0.08)
Troponin i, poc: 0.01 ng/mL (ref 0.00–0.08)

## 2016-03-30 NOTE — ED Provider Notes (Signed)
[facility] DEPT Provider Note   CSN: [id number] Arrival date & time: 03/30/16  1325     History   Chief Complaint Chief Complaint  Patient presents with  . Chest Pain    HPI Cole Brown is a 70 y.o. male.  He presents for evaluation of pressure sensation in his chest, which occurs daily, but was worse today than usual, causing him to come here for evaluation. The pain radiates to his left neck, and makes it hard to breathe. He does not feel short of breath at rest. Typically this type of pain improves with rest. He has not seen a primary care doctor about it. He has had this problem for use. There is no associated diaphoresis, vomiting, blurred vision, headache or back pain. No prior history of cardiac disease. He has allergies, which he thinks is causing the problem, and for which he takes weekly allergy desensitization injections. There are no other known modifying factors.  HPI  Past Medical History:  Diagnosis Date  . Fibromyalgia   . Fibromyalgia   . Hypercholesteremia   . Hypertension     There are no active problems to display for this patient.   Past Surgical History:  Procedure Laterality Date  . allergy shots  weekly  . CHOLECYSTECTOMY    . COLONOSCOPY     Dr.Rehman  . COLONOSCOPY N/A 01/27/2015   Procedure: COLONOSCOPY;  Surgeon: Rogene Houston, MD;  Location: [facility];  Service: Endoscopy;  Laterality: N/A;  930  . GALLBLADDER SURGERY  12/1993  . UPPER GASTROINTESTINAL ENDOSCOPY         Home Medications    Prior to Admission medications   Medication Sig Start Date End Date Taking? Authorizing Provider  acetaminophen (TYLENOL) 650 MG CR tablet Take 650 mg by mouth 4 (four) times daily.   Yes Historical Provider, MD  ANDROGEL PUMP 20.25 MG/ACT (1.62%) GEL Apply 2 each topically daily. Bilateral shoulders per application [id number]  Yes Historical Provider, MD  atenolol (TENORMIN) 50 MG tablet Take 50 mg by mouth every evening. 03/05/16  Yes  Historical Provider, MD  cholecalciferol (VITAMIN D) 1000 units tablet Take 1,000 Units by mouth daily.   Yes Historical Provider, MD  diphenhydrAMINE (BENADRYL) 25 MG tablet Take 25 mg by mouth as needed for allergies.    Yes Historical Provider, MD  Ferrous Sulfate (SLOW FE PO) Take 1 tablet by mouth daily.   Yes Historical Provider, MD  fluticasone (FLONASE ALLERGY RELIEF) 50 MCG/ACT nasal spray Place 2 sprays into both nostrils daily as needed for allergies or rhinitis.    Yes Historical Provider, MD  Homeopathic Products (ARNICA) GEL Apply 1 application topically daily as needed (for pain). Arnica Motanna GEL    Yes Historical Provider, MD  levocetirizine (XYZAL) 5 MG tablet Take 5 mg by mouth every morning.    Yes Historical Provider, MD  losartan (COZAAR) 50 MG tablet Take 50 mg by mouth every morning.    Yes Historical Provider, MD  Menthol-Methyl Salicylate (SALONPAS JET SPRAY EX) Apply 1 application topically daily as needed (for pain).   Yes Historical Provider, MD  modafinil (PROVIGIL) 200 MG tablet Take 100-200 mg by mouth every morning.   Yes Historical Provider, MD  naproxen sodium (ALEVE) 220 MG tablet Take 220-440 mg by mouth daily as needed (for pain).   Yes Historical Provider, MD  Olopatadine HCl (PATADAY) 0.2 % SOLN Apply 1 drop to eye daily as needed (eye irritation).    Yes Historical Provider,  MD  Omega-3 Fatty Acids (FISH OIL) 1000 MG CAPS Take 1 capsule by mouth 2 (two) times daily.   Yes Historical Provider, MD  ranitidine (ZANTAC) 75 MG tablet Take 75 mg by mouth daily as needed for heartburn.    Yes Historical Provider, MD  sodium chloride (MURO 128) 5 % ophthalmic ointment Place 1 application into both eyes 2 (two) times daily.   Yes Historical Provider, MD  sodium chloride (MURO 128) 5 % ophthalmic solution Place 1 drop into both eyes daily as needed for irritation.    Yes Historical Provider, MD  traMADol (ULTRAM) 50 MG tablet Take 50 mg by mouth 4 (four) times daily  as needed for moderate pain.    Yes Historical Provider, MD  traMADol (ULTRAM) 50 MG tablet Take 50 mg by mouth daily as needed for moderate pain.   Yes Historical Provider, MD  triazolam (HALCION) 0.25 MG tablet Take 0.125 mg by mouth at bedtime.    Yes Historical Provider, MD  trolamine salicylate (ASPERCREME) 10 % cream Apply 1 application topically as needed for muscle pain.   Yes Historical Provider, MD    Family History Family History  Problem Relation Age of Onset  . Heart disease Mother   . Pancreatic cancer Mother   . Prostate cancer Father   . Healthy Sister   . Parkinson's disease Brother   . Asthma Sister   . Healthy Sister     Social History Social History  Substance Use Topics  . Smoking status: Never Smoker  . Smokeless tobacco: Never Used  . Alcohol use 0.0 oz/week     Comment: occas     Allergies   Lodine [etodolac]   Review of Systems Review of Systems  All other systems reviewed and are negative.    Physical Exam Updated Vital Signs BP 166/90   Pulse (!) 52   Temp 97.8 F (36.6 C) (Oral)   Resp 12   Ht 5\' 11"  (1.803 m)   Wt 195 lb (88.5 kg)   SpO2 98%   BMI 27.20 kg/m   Physical Exam  Constitutional: He is oriented to person, place, and time. He appears well-developed and well-nourished.  HENT:  Head: Normocephalic and atraumatic.  Right Ear: External ear normal.  Left Ear: External ear normal.  Eyes: Conjunctivae and EOM are normal. Pupils are equal, round, and reactive to light.  Neck: Normal range of motion and phonation normal. Neck supple.  Cardiovascular: Normal rate, regular rhythm and normal heart sounds.   Pulmonary/Chest: Effort normal and breath sounds normal. He exhibits no bony tenderness.  No tenderness or crepitation of the chest wall  Abdominal: Soft. There is no tenderness.  Musculoskeletal: Normal range of motion.  Neurological: He is alert and oriented to person, place, and time. No cranial nerve deficit or sensory  deficit. He exhibits normal muscle tone. Coordination normal.  Skin: Skin is warm, dry and intact.  Psychiatric: He has a normal mood and affect. His behavior is normal. Judgment and thought content normal.  Anxious  Nursing note and vitals reviewed.    ED Treatments / Results  Labs (all labs ordered are listed, but only abnormal results are displayed) Labs Reviewed  BASIC METABOLIC PANEL - Abnormal; Notable for the following:       Result Value   Sodium 132 (*)    Potassium 3.4 (*)    Chloride 99 (*)    All other components within normal limits  CBC  I-STAT TROPOININ, ED  I-STAT  TROPOININ, ED    EKG  EKG Interpretation  Date/Time:  Friday March 30 2016 14:10:59 EDT Ventricular Rate:  52 PR Interval:  192 QRS Duration: 106 QT Interval:  440 QTC Calculation: 410 R Axis:   81 Text Interpretation:  Sinus rhythm Probable left atrial enlargement Borderline right axis deviation Nonspecific T abnrm, anterolateral leads Minimal ST elevation, inferior leads Baseline wander in lead(s) V1 Since last tracing of earlier today No significant change was found Confirmed by Eulis Foster  MD, Wendall Isabell [phone]) on 03/30/2016 2:20:10 PM       Radiology Dg Chest 2 View  Result Date: 03/30/2016 CLINICAL DATA:  Left-sided chest pain. EXAM: CHEST  2 VIEW COMPARISON:  August 20, 2005 FINDINGS: The heart size borderline and stable. The hila and mediastinum are unchanged. The lungs remain clear. No cause for left-sided pain identified. IMPRESSION: No active cardiopulmonary disease. Electronically Signed   By: Dorise Bullion III M.D   On: 03/30/2016 14:16    Procedures Procedures (including critical care time)  Medications Ordered in ED Medications - No data to display   Initial Impression / Assessment and Plan / ED Course  I have reviewed the triage vital signs and the nursing notes.  Pertinent labs & imaging results that were available during my care of the patient were reviewed by me and  considered in my medical decision making (see chart for details).  Clinical Course    Medications - No data to display  Patient Vitals for the past 24 hrs:  BP Temp Temp src Pulse Resp SpO2 Height Weight  03/30/16 1730 166/90 - - (!) 52 12 98 % - -  03/30/16 1715 - - - (!) 50 12 98 % - -  03/30/16 1700 150/97 - - - 11 - - -  03/30/16 1630 161/92 - - (!) 50 11 99 % - -  03/30/16 1600 165/98 - - (!) 37 14 100 % - -  03/30/16 1530 (!) 167/101 - - (!) 52 16 100 % - -  03/30/16 1500 (!) 171/101 - - (!) 54 14 98 % - -  03/30/16 1445 - - - 66 15 100 % - -  03/30/16 1430 (!) 186/110 - - - 15 - - -  03/30/16 1400 194/95 - - (!) 50 13 97 % - -  03/30/16 1333 (!) 218/101 97.8 F (36.6 C) Oral (!) 51 18 100 % - -  03/30/16 1331 - - - - - - 5\' 11"  (1.803 m) 195 lb (88.5 kg)    At discharge- Reevaluation with update and discussion. After initial assessment and treatment, an updated evaluation reveals No further complaints. Findings discussed with patient and wife, all questions answered. Carmon Brigandi L    Final Clinical Impressions(s) / ED Diagnoses   Final diagnoses:  Nonspecific chest pain    Atypical for cardiac chest pain, with negative evaluation. Doubt ACS, PE or pneumonia. His pain is recurrent, daily for a prolonged period of time. Stable for discharge and outpatient management.  Nursing Notes Reviewed/ Care Coordinated Applicable Imaging Reviewed Interpretation of Laboratory Data incorporated into ED treatment  The patient appears reasonably screened and/or stabilized for discharge and I doubt any other medical condition or other [facility] requiring further screening, evaluation, or treatment in the ED at this time prior to discharge.  Plan: Home Medications- continue; Home Treatments- rest; return here if the recommended treatment, does not improve the symptoms; Recommended follow up- PCP prn  New Prescriptions New Prescriptions   No medications on  file     Daleen Bo,  MD 03/30/16 2149

## 2016-03-30 NOTE — ED Notes (Signed)
Pt c/o dizziness and chest pain.  Repeated EKG and given to Dr. Eulis Foster.

## 2016-03-30 NOTE — ED Triage Notes (Signed)
Pt reports intermittent chest pain for several weeks. Pt states it has become more constant. Pt describes the pain as aching. Reports "some dizziness" but no SOB, diaphoresis or n/v. Ekg completed in Triage.

## 2016-04-02 DIAGNOSIS — J3081 Allergic rhinitis due to animal (cat) (dog) hair and dander: Secondary | ICD-10-CM | POA: Diagnosis not present

## 2016-04-02 DIAGNOSIS — J301 Allergic rhinitis due to pollen: Secondary | ICD-10-CM | POA: Diagnosis not present

## 2016-04-02 DIAGNOSIS — J3089 Other allergic rhinitis: Secondary | ICD-10-CM | POA: Diagnosis not present

## 2016-04-05 DIAGNOSIS — I10 Essential (primary) hypertension: Secondary | ICD-10-CM | POA: Diagnosis not present

## 2016-04-05 DIAGNOSIS — K219 Gastro-esophageal reflux disease without esophagitis: Secondary | ICD-10-CM | POA: Diagnosis not present

## 2016-04-05 DIAGNOSIS — Z1389 Encounter for screening for other disorder: Secondary | ICD-10-CM | POA: Diagnosis not present

## 2016-04-05 DIAGNOSIS — M0689 Other specified rheumatoid arthritis, multiple sites: Secondary | ICD-10-CM | POA: Diagnosis not present

## 2016-04-05 DIAGNOSIS — Z6826 Body mass index (BMI) 26.0-26.9, adult: Secondary | ICD-10-CM | POA: Diagnosis not present

## 2016-04-05 DIAGNOSIS — E782 Mixed hyperlipidemia: Secondary | ICD-10-CM | POA: Diagnosis not present

## 2016-04-09 DIAGNOSIS — J301 Allergic rhinitis due to pollen: Secondary | ICD-10-CM | POA: Diagnosis not present

## 2016-04-09 DIAGNOSIS — J3089 Other allergic rhinitis: Secondary | ICD-10-CM | POA: Diagnosis not present

## 2016-04-16 DIAGNOSIS — J3081 Allergic rhinitis due to animal (cat) (dog) hair and dander: Secondary | ICD-10-CM | POA: Diagnosis not present

## 2016-04-16 DIAGNOSIS — J3089 Other allergic rhinitis: Secondary | ICD-10-CM | POA: Diagnosis not present

## 2016-04-16 DIAGNOSIS — J301 Allergic rhinitis due to pollen: Secondary | ICD-10-CM | POA: Diagnosis not present

## 2016-04-17 DIAGNOSIS — J3089 Other allergic rhinitis: Secondary | ICD-10-CM | POA: Diagnosis not present

## 2016-04-17 DIAGNOSIS — J3081 Allergic rhinitis due to animal (cat) (dog) hair and dander: Secondary | ICD-10-CM | POA: Diagnosis not present

## 2016-04-17 DIAGNOSIS — J301 Allergic rhinitis due to pollen: Secondary | ICD-10-CM | POA: Diagnosis not present

## 2016-04-23 DIAGNOSIS — J3081 Allergic rhinitis due to animal (cat) (dog) hair and dander: Secondary | ICD-10-CM | POA: Diagnosis not present

## 2016-04-23 DIAGNOSIS — J301 Allergic rhinitis due to pollen: Secondary | ICD-10-CM | POA: Diagnosis not present

## 2016-04-23 DIAGNOSIS — J3089 Other allergic rhinitis: Secondary | ICD-10-CM | POA: Diagnosis not present

## 2016-05-02 DIAGNOSIS — J3081 Allergic rhinitis due to animal (cat) (dog) hair and dander: Secondary | ICD-10-CM | POA: Diagnosis not present

## 2016-05-02 DIAGNOSIS — J301 Allergic rhinitis due to pollen: Secondary | ICD-10-CM | POA: Diagnosis not present

## 2016-05-02 DIAGNOSIS — J3089 Other allergic rhinitis: Secondary | ICD-10-CM | POA: Diagnosis not present

## 2016-05-04 DIAGNOSIS — J3081 Allergic rhinitis due to animal (cat) (dog) hair and dander: Secondary | ICD-10-CM | POA: Diagnosis not present

## 2016-05-04 DIAGNOSIS — J3089 Other allergic rhinitis: Secondary | ICD-10-CM | POA: Diagnosis not present

## 2016-05-04 DIAGNOSIS — J301 Allergic rhinitis due to pollen: Secondary | ICD-10-CM | POA: Diagnosis not present

## 2016-05-07 DIAGNOSIS — J3089 Other allergic rhinitis: Secondary | ICD-10-CM | POA: Diagnosis not present

## 2016-05-07 DIAGNOSIS — J3081 Allergic rhinitis due to animal (cat) (dog) hair and dander: Secondary | ICD-10-CM | POA: Diagnosis not present

## 2016-05-07 DIAGNOSIS — J301 Allergic rhinitis due to pollen: Secondary | ICD-10-CM | POA: Diagnosis not present

## 2016-05-09 DIAGNOSIS — J3081 Allergic rhinitis due to animal (cat) (dog) hair and dander: Secondary | ICD-10-CM | POA: Diagnosis not present

## 2016-05-09 DIAGNOSIS — J301 Allergic rhinitis due to pollen: Secondary | ICD-10-CM | POA: Diagnosis not present

## 2016-05-09 DIAGNOSIS — J3089 Other allergic rhinitis: Secondary | ICD-10-CM | POA: Diagnosis not present

## 2016-05-14 DIAGNOSIS — J301 Allergic rhinitis due to pollen: Secondary | ICD-10-CM | POA: Diagnosis not present

## 2016-05-14 DIAGNOSIS — J3089 Other allergic rhinitis: Secondary | ICD-10-CM | POA: Diagnosis not present

## 2016-05-14 DIAGNOSIS — J3081 Allergic rhinitis due to animal (cat) (dog) hair and dander: Secondary | ICD-10-CM | POA: Diagnosis not present

## 2016-05-21 DIAGNOSIS — J3089 Other allergic rhinitis: Secondary | ICD-10-CM | POA: Diagnosis not present

## 2016-05-21 DIAGNOSIS — J3081 Allergic rhinitis due to animal (cat) (dog) hair and dander: Secondary | ICD-10-CM | POA: Diagnosis not present

## 2016-05-21 DIAGNOSIS — J301 Allergic rhinitis due to pollen: Secondary | ICD-10-CM | POA: Diagnosis not present

## 2016-05-28 DIAGNOSIS — H1851 Endothelial corneal dystrophy: Secondary | ICD-10-CM | POA: Diagnosis not present

## 2016-05-28 DIAGNOSIS — H35023 Exudative retinopathy, bilateral: Secondary | ICD-10-CM | POA: Diagnosis not present

## 2016-05-28 DIAGNOSIS — H18519 Endothelial corneal dystrophy, unspecified eye: Secondary | ICD-10-CM | POA: Insufficient documentation

## 2016-05-28 DIAGNOSIS — L719 Rosacea, unspecified: Secondary | ICD-10-CM | POA: Diagnosis not present

## 2016-05-28 DIAGNOSIS — H01003 Unspecified blepharitis right eye, unspecified eyelid: Secondary | ICD-10-CM | POA: Diagnosis not present

## 2016-05-28 DIAGNOSIS — H25813 Combined forms of age-related cataract, bilateral: Secondary | ICD-10-CM | POA: Diagnosis not present

## 2016-05-28 DIAGNOSIS — H01006 Unspecified blepharitis left eye, unspecified eyelid: Secondary | ICD-10-CM | POA: Diagnosis not present

## 2016-05-28 DIAGNOSIS — H25811 Combined forms of age-related cataract, right eye: Secondary | ICD-10-CM | POA: Diagnosis not present

## 2016-05-30 DIAGNOSIS — J3089 Other allergic rhinitis: Secondary | ICD-10-CM | POA: Diagnosis not present

## 2016-05-30 DIAGNOSIS — J301 Allergic rhinitis due to pollen: Secondary | ICD-10-CM | POA: Diagnosis not present

## 2016-05-30 DIAGNOSIS — J3081 Allergic rhinitis due to animal (cat) (dog) hair and dander: Secondary | ICD-10-CM | POA: Diagnosis not present

## 2016-06-06 DIAGNOSIS — J301 Allergic rhinitis due to pollen: Secondary | ICD-10-CM | POA: Diagnosis not present

## 2016-06-06 DIAGNOSIS — J3089 Other allergic rhinitis: Secondary | ICD-10-CM | POA: Diagnosis not present

## 2016-06-06 DIAGNOSIS — J3081 Allergic rhinitis due to animal (cat) (dog) hair and dander: Secondary | ICD-10-CM | POA: Diagnosis not present

## 2016-06-12 DIAGNOSIS — J3081 Allergic rhinitis due to animal (cat) (dog) hair and dander: Secondary | ICD-10-CM | POA: Diagnosis not present

## 2016-06-12 DIAGNOSIS — J3089 Other allergic rhinitis: Secondary | ICD-10-CM | POA: Diagnosis not present

## 2016-06-12 DIAGNOSIS — J301 Allergic rhinitis due to pollen: Secondary | ICD-10-CM | POA: Diagnosis not present

## 2016-06-19 DIAGNOSIS — J301 Allergic rhinitis due to pollen: Secondary | ICD-10-CM | POA: Diagnosis not present

## 2016-06-19 DIAGNOSIS — J3089 Other allergic rhinitis: Secondary | ICD-10-CM | POA: Diagnosis not present

## 2016-06-19 DIAGNOSIS — J3081 Allergic rhinitis due to animal (cat) (dog) hair and dander: Secondary | ICD-10-CM | POA: Diagnosis not present

## 2016-06-25 DIAGNOSIS — J3081 Allergic rhinitis due to animal (cat) (dog) hair and dander: Secondary | ICD-10-CM | POA: Diagnosis not present

## 2016-06-25 DIAGNOSIS — J301 Allergic rhinitis due to pollen: Secondary | ICD-10-CM | POA: Diagnosis not present

## 2016-06-25 DIAGNOSIS — J3089 Other allergic rhinitis: Secondary | ICD-10-CM | POA: Diagnosis not present

## 2016-07-11 DIAGNOSIS — J3081 Allergic rhinitis due to animal (cat) (dog) hair and dander: Secondary | ICD-10-CM | POA: Diagnosis not present

## 2016-07-11 DIAGNOSIS — J301 Allergic rhinitis due to pollen: Secondary | ICD-10-CM | POA: Diagnosis not present

## 2016-07-11 DIAGNOSIS — J3089 Other allergic rhinitis: Secondary | ICD-10-CM | POA: Diagnosis not present

## 2016-07-16 DIAGNOSIS — J3089 Other allergic rhinitis: Secondary | ICD-10-CM | POA: Diagnosis not present

## 2016-07-16 DIAGNOSIS — J3081 Allergic rhinitis due to animal (cat) (dog) hair and dander: Secondary | ICD-10-CM | POA: Diagnosis not present

## 2016-07-16 DIAGNOSIS — J301 Allergic rhinitis due to pollen: Secondary | ICD-10-CM | POA: Diagnosis not present

## 2016-07-25 DIAGNOSIS — J301 Allergic rhinitis due to pollen: Secondary | ICD-10-CM | POA: Diagnosis not present

## 2016-07-25 DIAGNOSIS — J3089 Other allergic rhinitis: Secondary | ICD-10-CM | POA: Diagnosis not present

## 2016-07-25 DIAGNOSIS — J3081 Allergic rhinitis due to animal (cat) (dog) hair and dander: Secondary | ICD-10-CM | POA: Diagnosis not present

## 2016-07-30 DIAGNOSIS — J3089 Other allergic rhinitis: Secondary | ICD-10-CM | POA: Diagnosis not present

## 2016-07-30 DIAGNOSIS — J3081 Allergic rhinitis due to animal (cat) (dog) hair and dander: Secondary | ICD-10-CM | POA: Diagnosis not present

## 2016-07-30 DIAGNOSIS — J301 Allergic rhinitis due to pollen: Secondary | ICD-10-CM | POA: Diagnosis not present

## 2016-08-06 DIAGNOSIS — J301 Allergic rhinitis due to pollen: Secondary | ICD-10-CM | POA: Diagnosis not present

## 2016-08-06 DIAGNOSIS — J3089 Other allergic rhinitis: Secondary | ICD-10-CM | POA: Diagnosis not present

## 2016-08-06 DIAGNOSIS — J3081 Allergic rhinitis due to animal (cat) (dog) hair and dander: Secondary | ICD-10-CM | POA: Diagnosis not present

## 2016-08-13 DIAGNOSIS — J301 Allergic rhinitis due to pollen: Secondary | ICD-10-CM | POA: Diagnosis not present

## 2016-08-13 DIAGNOSIS — J3089 Other allergic rhinitis: Secondary | ICD-10-CM | POA: Diagnosis not present

## 2016-08-13 DIAGNOSIS — J3081 Allergic rhinitis due to animal (cat) (dog) hair and dander: Secondary | ICD-10-CM | POA: Diagnosis not present

## 2016-08-22 DIAGNOSIS — J3081 Allergic rhinitis due to animal (cat) (dog) hair and dander: Secondary | ICD-10-CM | POA: Diagnosis not present

## 2016-08-22 DIAGNOSIS — J3089 Other allergic rhinitis: Secondary | ICD-10-CM | POA: Diagnosis not present

## 2016-08-22 DIAGNOSIS — J301 Allergic rhinitis due to pollen: Secondary | ICD-10-CM | POA: Diagnosis not present

## 2016-08-24 DIAGNOSIS — M797 Fibromyalgia: Secondary | ICD-10-CM | POA: Diagnosis not present

## 2016-08-24 DIAGNOSIS — Z1389 Encounter for screening for other disorder: Secondary | ICD-10-CM | POA: Diagnosis not present

## 2016-08-24 DIAGNOSIS — H0236 Blepharochalasis left eye, unspecified eyelid: Secondary | ICD-10-CM | POA: Diagnosis not present

## 2016-08-24 DIAGNOSIS — Z6827 Body mass index (BMI) 27.0-27.9, adult: Secondary | ICD-10-CM | POA: Diagnosis not present

## 2016-08-31 DIAGNOSIS — J3081 Allergic rhinitis due to animal (cat) (dog) hair and dander: Secondary | ICD-10-CM | POA: Diagnosis not present

## 2016-08-31 DIAGNOSIS — J3089 Other allergic rhinitis: Secondary | ICD-10-CM | POA: Diagnosis not present

## 2016-08-31 DIAGNOSIS — J301 Allergic rhinitis due to pollen: Secondary | ICD-10-CM | POA: Diagnosis not present

## 2016-09-03 DIAGNOSIS — J3081 Allergic rhinitis due to animal (cat) (dog) hair and dander: Secondary | ICD-10-CM | POA: Diagnosis not present

## 2016-09-03 DIAGNOSIS — J3089 Other allergic rhinitis: Secondary | ICD-10-CM | POA: Diagnosis not present

## 2016-09-03 DIAGNOSIS — J301 Allergic rhinitis due to pollen: Secondary | ICD-10-CM | POA: Diagnosis not present

## 2016-09-05 DIAGNOSIS — J301 Allergic rhinitis due to pollen: Secondary | ICD-10-CM | POA: Diagnosis not present

## 2016-09-05 DIAGNOSIS — J3089 Other allergic rhinitis: Secondary | ICD-10-CM | POA: Diagnosis not present

## 2016-09-05 DIAGNOSIS — J3081 Allergic rhinitis due to animal (cat) (dog) hair and dander: Secondary | ICD-10-CM | POA: Diagnosis not present

## 2016-09-10 DIAGNOSIS — J3089 Other allergic rhinitis: Secondary | ICD-10-CM | POA: Diagnosis not present

## 2016-09-10 DIAGNOSIS — J301 Allergic rhinitis due to pollen: Secondary | ICD-10-CM | POA: Diagnosis not present

## 2016-09-10 DIAGNOSIS — J3081 Allergic rhinitis due to animal (cat) (dog) hair and dander: Secondary | ICD-10-CM | POA: Diagnosis not present

## 2016-09-17 DIAGNOSIS — J301 Allergic rhinitis due to pollen: Secondary | ICD-10-CM | POA: Diagnosis not present

## 2016-09-17 DIAGNOSIS — J3081 Allergic rhinitis due to animal (cat) (dog) hair and dander: Secondary | ICD-10-CM | POA: Diagnosis not present

## 2016-09-17 DIAGNOSIS — J3089 Other allergic rhinitis: Secondary | ICD-10-CM | POA: Diagnosis not present

## 2016-09-21 DIAGNOSIS — H1045 Other chronic allergic conjunctivitis: Secondary | ICD-10-CM | POA: Diagnosis not present

## 2016-09-21 DIAGNOSIS — J3081 Allergic rhinitis due to animal (cat) (dog) hair and dander: Secondary | ICD-10-CM | POA: Diagnosis not present

## 2016-09-21 DIAGNOSIS — J301 Allergic rhinitis due to pollen: Secondary | ICD-10-CM | POA: Diagnosis not present

## 2016-09-21 DIAGNOSIS — J3089 Other allergic rhinitis: Secondary | ICD-10-CM | POA: Diagnosis not present

## 2016-09-24 DIAGNOSIS — J3089 Other allergic rhinitis: Secondary | ICD-10-CM | POA: Diagnosis not present

## 2016-09-24 DIAGNOSIS — J3081 Allergic rhinitis due to animal (cat) (dog) hair and dander: Secondary | ICD-10-CM | POA: Diagnosis not present

## 2016-09-24 DIAGNOSIS — J301 Allergic rhinitis due to pollen: Secondary | ICD-10-CM | POA: Diagnosis not present

## 2016-10-01 DIAGNOSIS — J3089 Other allergic rhinitis: Secondary | ICD-10-CM | POA: Diagnosis not present

## 2016-10-01 DIAGNOSIS — J3081 Allergic rhinitis due to animal (cat) (dog) hair and dander: Secondary | ICD-10-CM | POA: Diagnosis not present

## 2016-10-01 DIAGNOSIS — J301 Allergic rhinitis due to pollen: Secondary | ICD-10-CM | POA: Diagnosis not present

## 2016-10-08 DIAGNOSIS — J3081 Allergic rhinitis due to animal (cat) (dog) hair and dander: Secondary | ICD-10-CM | POA: Diagnosis not present

## 2016-10-08 DIAGNOSIS — J3089 Other allergic rhinitis: Secondary | ICD-10-CM | POA: Diagnosis not present

## 2016-10-08 DIAGNOSIS — J301 Allergic rhinitis due to pollen: Secondary | ICD-10-CM | POA: Diagnosis not present

## 2016-10-11 DIAGNOSIS — J3089 Other allergic rhinitis: Secondary | ICD-10-CM | POA: Diagnosis not present

## 2016-10-11 DIAGNOSIS — J3081 Allergic rhinitis due to animal (cat) (dog) hair and dander: Secondary | ICD-10-CM | POA: Diagnosis not present

## 2016-10-11 DIAGNOSIS — J301 Allergic rhinitis due to pollen: Secondary | ICD-10-CM | POA: Diagnosis not present

## 2016-10-18 DIAGNOSIS — J301 Allergic rhinitis due to pollen: Secondary | ICD-10-CM | POA: Diagnosis not present

## 2016-10-18 DIAGNOSIS — J3089 Other allergic rhinitis: Secondary | ICD-10-CM | POA: Diagnosis not present

## 2016-10-18 DIAGNOSIS — J3081 Allergic rhinitis due to animal (cat) (dog) hair and dander: Secondary | ICD-10-CM | POA: Diagnosis not present

## 2016-10-22 DIAGNOSIS — J301 Allergic rhinitis due to pollen: Secondary | ICD-10-CM | POA: Diagnosis not present

## 2016-10-22 DIAGNOSIS — J3089 Other allergic rhinitis: Secondary | ICD-10-CM | POA: Diagnosis not present

## 2016-10-22 DIAGNOSIS — J3081 Allergic rhinitis due to animal (cat) (dog) hair and dander: Secondary | ICD-10-CM | POA: Diagnosis not present

## 2016-10-29 DIAGNOSIS — J301 Allergic rhinitis due to pollen: Secondary | ICD-10-CM | POA: Diagnosis not present

## 2016-10-29 DIAGNOSIS — J3081 Allergic rhinitis due to animal (cat) (dog) hair and dander: Secondary | ICD-10-CM | POA: Diagnosis not present

## 2016-10-29 DIAGNOSIS — J3089 Other allergic rhinitis: Secondary | ICD-10-CM | POA: Diagnosis not present

## 2016-10-31 DIAGNOSIS — H538 Other visual disturbances: Secondary | ICD-10-CM | POA: Diagnosis not present

## 2016-10-31 DIAGNOSIS — K219 Gastro-esophageal reflux disease without esophagitis: Secondary | ICD-10-CM | POA: Diagnosis not present

## 2016-10-31 DIAGNOSIS — E782 Mixed hyperlipidemia: Secondary | ICD-10-CM | POA: Diagnosis not present

## 2016-10-31 DIAGNOSIS — I10 Essential (primary) hypertension: Secondary | ICD-10-CM | POA: Diagnosis not present

## 2016-10-31 DIAGNOSIS — Z6826 Body mass index (BMI) 26.0-26.9, adult: Secondary | ICD-10-CM | POA: Diagnosis not present

## 2016-10-31 DIAGNOSIS — H269 Unspecified cataract: Secondary | ICD-10-CM | POA: Diagnosis not present

## 2016-10-31 DIAGNOSIS — E663 Overweight: Secondary | ICD-10-CM | POA: Diagnosis not present

## 2016-11-14 ENCOUNTER — Emergency Department (HOSPITAL_COMMUNITY): Payer: Medicare HMO

## 2016-11-14 ENCOUNTER — Observation Stay (HOSPITAL_COMMUNITY)
Admission: EM | Admit: 2016-11-14 | Discharge: 2016-11-16 | Disposition: A | Discharge status: Home / Self Care | Payer: Medicare HMO | Attending: Internal Medicine | Admitting: Internal Medicine

## 2016-11-14 ENCOUNTER — Encounter (HOSPITAL_COMMUNITY): Payer: Self-pay | Admitting: Emergency Medicine

## 2016-11-14 DIAGNOSIS — J3081 Allergic rhinitis due to animal (cat) (dog) hair and dander: Secondary | ICD-10-CM | POA: Diagnosis not present

## 2016-11-14 DIAGNOSIS — Z79899 Other long term (current) drug therapy: Secondary | ICD-10-CM | POA: Insufficient documentation

## 2016-11-14 DIAGNOSIS — D649 Anemia, unspecified: Secondary | ICD-10-CM | POA: Diagnosis not present

## 2016-11-14 DIAGNOSIS — K644 Residual hemorrhoidal skin tags: Principal | ICD-10-CM | POA: Insufficient documentation

## 2016-11-14 DIAGNOSIS — J3089 Other allergic rhinitis: Secondary | ICD-10-CM | POA: Diagnosis not present

## 2016-11-14 DIAGNOSIS — K921 Melena: Secondary | ICD-10-CM

## 2016-11-14 DIAGNOSIS — K625 Hemorrhage of anus and rectum: Secondary | ICD-10-CM | POA: Diagnosis not present

## 2016-11-14 DIAGNOSIS — K5900 Constipation, unspecified: Secondary | ICD-10-CM | POA: Diagnosis not present

## 2016-11-14 DIAGNOSIS — I10 Essential (primary) hypertension: Secondary | ICD-10-CM

## 2016-11-14 DIAGNOSIS — J301 Allergic rhinitis due to pollen: Secondary | ICD-10-CM | POA: Diagnosis not present

## 2016-11-14 HISTORY — DX: Diverticulosis of large intestine without perforation or abscess without bleeding: K57.30

## 2016-11-14 LAB — COMPREHENSIVE METABOLIC PANEL
ALT: 15 U/L — ABNORMAL LOW (ref 17–63)
AST: 17 U/L (ref 15–41)
Albumin: 3.9 g/dL (ref 3.5–5.0)
Alkaline Phosphatase: 82 U/L (ref 38–126)
Anion gap: 8 (ref 5–15)
BUN: 23 mg/dL — ABNORMAL HIGH (ref 6–20)
CO2: 24 mmol/L (ref 22–32)
Calcium: 9 mg/dL (ref 8.9–10.3)
Chloride: 102 mmol/L (ref 101–111)
Creatinine, Ser: 1.03 mg/dL (ref 0.61–1.24)
GFR calc Af Amer: >60 mL/min (ref >60)
GFR calc non Af Amer: >60 mL/min (ref >60)
Glucose, Bld: 126 mg/dL — ABNORMAL HIGH (ref 65–99)
Potassium: 3.5 mmol/L (ref 3.5–5.1)
Sodium: 134 mmol/L — ABNORMAL LOW (ref 135–145)
Total Bilirubin: 0.5 mg/dL (ref 0.3–1.2)
Total Protein: 7.2 g/dL (ref 6.5–8.1)

## 2016-11-14 LAB — DIFFERENTIAL
Basophils Absolute: 0 10*3/uL (ref 0.0–0.1)
Basophils Relative: 0 %
Eosinophils Absolute: 0.2 10*3/uL (ref 0.0–0.7)
Eosinophils Relative: 2 %
Lymphocytes Relative: 18 %
Lymphs Abs: 1.7 10*3/uL (ref 0.7–4.0)
Monocytes Absolute: 0.8 10*3/uL (ref 0.1–1.0)
Monocytes Relative: 8 %
Neutro Abs: 6.7 10*3/uL (ref 1.7–7.7)
Neutrophils Relative %: 72 %

## 2016-11-14 LAB — TYPE AND SCREEN
ABO/RH(D): O POS
Antibody Screen: NEGATIVE

## 2016-11-14 LAB — CBC
HCT: 38.7 % — ABNORMAL LOW (ref 39.0–52.0)
Hemoglobin: 13.2 g/dL (ref 13.0–17.0)
MCH: 29.9 pg (ref 26.0–34.0)
MCHC: 34.1 g/dL (ref 30.0–36.0)
MCV: 87.6 fL (ref 78.0–100.0)
Platelets: 212 10*3/uL (ref 150–400)
RBC: 4.42 MIL/uL (ref 4.22–5.81)
RDW: 13.6 % (ref 11.5–15.5)
WBC: 9.4 10*3/uL (ref 4.0–10.5)

## 2016-11-14 LAB — POC OCCULT BLOOD, ED: Fecal Occult Bld: POSITIVE — AB

## 2016-11-14 MED ORDER — TRIAZOLAM 0.125 MG PO TABS
0.1250 mg | ORAL_TABLET | Freq: Every day | ORAL | Status: DC
  Administered 2016-11-14 – 2016-11-15 (×2): 0.125 mg via ORAL
  Filled 2016-11-14 (×2): qty 1

## 2016-11-14 MED ORDER — ACETAMINOPHEN 650 MG RE SUPP: 650.0000 mg | Freq: Four times a day (QID) | RECTAL | Status: DC | PRN

## 2016-11-14 MED ORDER — ACETAMINOPHEN 325 MG PO TABS: 650.0000 mg | ORAL_TABLET | Freq: Four times a day (QID) | ORAL | Status: DC | PRN

## 2016-11-14 MED ORDER — SODIUM CHLORIDE 0.9 % IV SOLN
INTRAVENOUS | Status: DC
  Administered 2016-11-14: 22:00:00 via INTRAVENOUS

## 2016-11-14 MED ORDER — LOSARTAN POTASSIUM 50 MG PO TABS
50.0000 mg | ORAL_TABLET | Freq: Every morning | ORAL | Status: DC
Start: 2016-11-15 — End: 2016-11-16
  Administered 2016-11-15 – 2016-11-16 (×2): 50 mg via ORAL
  Filled 2016-11-14 (×2): qty 1

## 2016-11-14 MED ORDER — ATENOLOL 25 MG PO TABS
50.0000 mg | ORAL_TABLET | Freq: Every evening | ORAL | Status: DC
  Administered 2016-11-14 – 2016-11-15 (×2): 50 mg via ORAL
  Filled 2016-11-14 (×2): qty 2

## 2016-11-14 MED ORDER — LORATADINE 10 MG PO TABS
10.0000 mg | ORAL_TABLET | Freq: Every day | ORAL | Status: DC
  Administered 2016-11-15 – 2016-11-16 (×2): 10 mg via ORAL
  Filled 2016-11-14 (×2): qty 1

## 2016-11-14 MED ORDER — FLUTICASONE PROPIONATE 50 MCG/ACT NA SUSP: 2.0000 | Freq: Every day | NASAL | Status: DC | PRN

## 2016-11-14 MED ORDER — HYDRALAZINE HCL 25 MG PO TABS: 25.0000 mg | ORAL_TABLET | Freq: Four times a day (QID) | ORAL | Status: DC | PRN

## 2016-11-14 MED ORDER — LEVOCETIRIZINE DIHYDROCHLORIDE 5 MG PO TABS: 5.0000 mg | ORAL_TABLET | Freq: Every morning | ORAL | Status: DC

## 2016-11-14 MED ORDER — FAMOTIDINE 20 MG PO TABS
20.0000 mg | ORAL_TABLET | Freq: Every day | ORAL | Status: DC
  Administered 2016-11-15 – 2016-11-16 (×2): 20 mg via ORAL
  Filled 2016-11-14 (×2): qty 1

## 2016-11-14 MED ORDER — ONDANSETRON HCL 4 MG/2ML IJ SOLN: 4.0000 mg | Freq: Four times a day (QID) | INTRAMUSCULAR | Status: DC | PRN

## 2016-11-14 MED ORDER — MODAFINIL 200 MG PO TABS
100.0000 mg | ORAL_TABLET | Freq: Every morning | ORAL | Status: DC
  Filled 2016-11-14 (×2): qty 1

## 2016-11-14 MED ORDER — VITAMIN D 1000 UNITS PO TABS
1000.0000 [IU] | ORAL_TABLET | Freq: Every day | ORAL | Status: DC
  Administered 2016-11-15 – 2016-11-16 (×2): 1000 [IU] via ORAL
  Filled 2016-11-14 (×2): qty 1

## 2016-11-14 MED ORDER — ONDANSETRON HCL 4 MG PO TABS: 4.0000 mg | ORAL_TABLET | Freq: Four times a day (QID) | ORAL | Status: DC | PRN

## 2016-11-14 NOTE — H&P (Signed)
[facility] H&P    Patient Demographics:    Cole Brown, is a 71 y.o. male  MRN: [id number]  DOB - 03-31-1946  Admit Date - 11/14/2016  Referring MD/NP/PA: Dr Thurnell Garbe  Outpatient Primary MD for the patient is Redmond School, MD    Chief Complaint  Patient presents with  . Rectal Bleeding      HPI:    Cole Brown  is a 71 y.o. male, who came to the hospital with chief complaint of rectal bleeding for past 3 days. Patient says that he has history of diverticulosis and external hemorrhoids found on colonoscopy in 2016, done by Dr. Laural Golden. He has been having 2-3 episodes of bloody bowel movements. It improved on Tuesday but started again today so he came to ED. He denies abdominal pain. Denies passing out or blurred vision. Denies nausea or vomiting. Denies diarrhea. Complains of occasional constipation. He denies chest pain. No shortness of breath.  In the ED Hemoglobin- 13.2, hematocrit 38.7 Blood pressure 169/105 BUN-23 Creatinine 1.03    Review of systems:    In addition to the HPI above,  No Fever-chills, No Headache, No changes with Vision or hearing, No problems swallowing food or Liquids, No Chest pain, Cough or Shortness of Breath,, No dysuria, No new skin rashes or bruises, No new joints pains-aches,  No new weakness, tingling, numbness in any extremity, No recent weight gain or loss, No polyuria, polydypsia or polyphagia, No significant Mental Stressors.  A full 10 point Review of Systems was done, except as stated above, all other Review of Systems were negative.   With Past History of the following :    Past Medical History:  Diagnosis Date  . Diverticula of colon    2016 colonoscopy  . Fibromyalgia   . Fibromyalgia   . Hypercholesteremia   . Hypertension       Past Surgical History:  Procedure Laterality Date  . allergy shots  weekly  . CHOLECYSTECTOMY    . COLONOSCOPY      Dr.Rehman  . COLONOSCOPY N/A 01/27/2015   Procedure: COLONOSCOPY;  Surgeon: Rogene Houston, MD;  Location: [facility];  Service: Endoscopy;  Laterality: N/A;  930  . GALLBLADDER SURGERY  12/1993  . UPPER GASTROINTESTINAL ENDOSCOPY        Social History:      Social History  Substance Use Topics  . Smoking status: Never Smoker  . Smokeless tobacco: Never Used  . Alcohol use 0.0 oz/week     Comment: occas       Family History :     Family History  Problem Relation Age of Onset  . Heart disease Mother   . Pancreatic cancer Mother   . Prostate cancer Father   . Healthy Sister   . Parkinson's disease Brother   . Asthma Sister   . Healthy Sister       Home Medications:   Prior to Admission medications   Medication Sig Start Date End Date Taking? Authorizing Provider  acetaminophen (TYLENOL) 650 MG CR tablet Take 650  mg by mouth 4 (four) times daily.    [provider]  ANDROGEL PUMP 20.25 MG/ACT (1.62%) GEL Apply 2 each topically daily. Bilateral shoulders per application 10/02/58   [provider]  atenolol (TENORMIN) 50 MG tablet Take 50 mg by mouth every evening. 03/05/16   [provider]  cholecalciferol (VITAMIN D) 1000 units tablet Take 1,000 Units by mouth daily.    [provider]  diphenhydrAMINE (BENADRYL) 25 MG tablet Take 25 mg by mouth as needed for allergies.     [provider]  Ferrous Sulfate (SLOW FE PO) Take 1 tablet by mouth daily.    [provider]  fluticasone (FLONASE ALLERGY RELIEF) 50 MCG/ACT nasal spray Place 2 sprays into both nostrils daily as needed for allergies or rhinitis.     [provider]  Homeopathic Products (ARNICA) GEL Apply 1 application topically daily as needed (for pain). Arnica Motanna GEL     [provider]  levocetirizine (XYZAL) 5 MG tablet Take 5 mg by mouth every morning.     [provider]  losartan (COZAAR) 50 MG tablet Take 50 mg by  mouth every morning.     [provider]  Menthol-Methyl Salicylate (SALONPAS JET SPRAY EX) Apply 1 application topically daily as needed (for pain).    [provider]  modafinil (PROVIGIL) 200 MG tablet Take 100-200 mg by mouth every morning.    [provider]  naproxen sodium (ALEVE) 220 MG tablet Take 220-440 mg by mouth daily as needed (for pain).    [provider]  Olopatadine HCl (PATADAY) 0.2 % SOLN Apply 1 drop to eye daily as needed (eye irritation).     [provider]  Omega-3 Fatty Acids (FISH OIL) 1000 MG CAPS Take 1 capsule by mouth 2 (two) times daily.    [provider]  ranitidine (ZANTAC) 75 MG tablet Take 75 mg by mouth daily as needed for heartburn.     [provider]  sodium chloride (MURO 128) 5 % ophthalmic ointment Place 1 application into both eyes 2 (two) times daily.    [provider]  sodium chloride (MURO 128) 5 % ophthalmic solution Place 1 drop into both eyes daily as needed for irritation.     [provider]  traMADol (ULTRAM) 50 MG tablet Take 50 mg by mouth 4 (four) times daily as needed for moderate pain.     [provider]  traMADol (ULTRAM) 50 MG tablet Take 50 mg by mouth daily as needed for moderate pain.    [provider]  triazolam (HALCION) 0.25 MG tablet Take 0.125 mg by mouth at bedtime.     [provider]  trolamine salicylate (ASPERCREME) 10 % cream Apply 1 application topically as needed for muscle pain.    [provider]     Allergies:     Allergies  Allergen Reactions  . Lodine [Etodolac] Swelling and Rash     Physical Exam:   Vitals  Blood pressure (!) 169/105, pulse 66, temperature 97.6 F (36.4 C), temperature source Oral, resp. rate 14, height 5\' 11"  (1.803 m), weight 88 kg (194 lb), SpO2 97 %.  1.  General:  Appears in no acute distress  2. Psychiatric:  Intact judgement and  insight, awake alert,  oriented x 3.  3. Neurologic: No focal neurological deficits, all cranial nerves intact.Strength 5/5 all 4 extremities, sensation intact all 4 extremities, plantars down going.  4. Eyes :  anicteric  sclerae, moist conjunctivae with no lid lag. PERRLA.  5. ENMT:  Oropharynx clear with moist mucous membranes and good dentition  6. Neck:  supple, no cervical lymphadenopathy appriciated, No thyromegaly  7. Respiratory : Normal respiratory effort, good air movement bilaterally,clear to  auscultation bilaterally  8. Cardiovascular : RRR, no gallops, rubs or murmurs, no leg edema  9. Gastrointestinal:  Positive bowel sounds, abdomen soft, non-tender to palpation,no hepatosplenomegaly, no rigidity or guarding       10. Skin:  No cyanosis, normal texture and turgor, no rash, lesions or ulcers  11.Musculoskeletal:  Good muscle tone,  joints appear normal , no effusions,  normal range of motion    Data Review:    CBC  Recent Labs Lab 11/14/16 1749  WBC 9.4  HGB 13.2  HCT 38.7*  PLT 212  MCV 87.6  MCH 29.9  MCHC 34.1  RDW 13.6  LYMPHSABS 1.7  MONOABS 0.8  EOSABS 0.2  BASOSABS 0.0   ------------------------------------------------------------------------------------------------------------------  Chemistries   Recent Labs Lab 11/14/16 1749  NA 134*  K 3.5  CL 102  CO2 24  GLUCOSE 126*  BUN 23*  CREATININE 1.03  CALCIUM 9.0  AST 17  ALT 15*  ALKPHOS 82  BILITOT 0.5   ------------------------------------------------------------------------------------------------------------------  ------------------------------------------------------------------------------------------------------------------ GFR: Estimated Creatinine Clearance: 68 mL/min (by C-G formula based on SCr of 1.03 mg/dL). Liver Function Tests:  Recent Labs Lab 11/14/16 1749  AST 17  ALT 15*  ALKPHOS 82  BILITOT 0.5  PROT 7.2  ALBUMIN 3.9     --------------------------------------------------------------------------------------------------------------- Urine analysis: No results found for: COLORURINE, APPEARANCEUR, LABSPEC, PHURINE, GLUCOSEU, HGBUR, BILIRUBINUR, KETONESUR, PROTEINUR, UROBILINOGEN, NITRITE, LEUKOCYTESUR    Imaging Results:    Dg Abd Acute W/chest  Result Date: 11/14/2016 CLINICAL DATA:  Bright red blood per rectum after bowel movement on Monday. EXAM: DG ABDOMEN ACUTE W/ 1V CHEST COMPARISON:  None. FINDINGS: There is no evidence of dilated bowel loops or free intraperitoneal air. A moderate amount of fecal retention is seen within the ascending move proximal descending colon. Surgical clips are seen in the right upper quadrant presumably from prior cholecystectomy. No radiopaque calculi or other significant radiographic abnormality is seen. Heart size and mediastinal contours are within normal limits. Both lungs are clear. Nipple shadows are seen bilaterally. IMPRESSION: Moderate amount of fecal retention within large bowel from cecum through proximal descending colon. No bowel obstruction. No acute cardiopulmonary disease. Electronically Signed   By: Ashley Royalty M.D.   On: 11/14/2016 19:06       Assessment & Plan:    Active Problems:   Rectal bleed   Essential hypertension   1. Rectal bleeding-likely from diverticulosis, will place under observation, check H&H at 1 AM tomorrow morning and repeat CBC at 7 AM. [facility] consult gastroenterology in a.m. we'll keep nothing by mouth. Start IV normal saline at 50 ML per hour. 2. Anemia- mild, hemoglobin is 13.2. Likely from above. Check CBC in a.m. Repeat H&H as above. We will transfuse as needed for hemoglobin less than 7. 3. Hypertension-blood pressure mildly elevated, continue home medications atenolol and Cozaar. We'll start hydralazine 25 mg by mouth every 6 hours when necessary for BP greater than 160/100. 4. Constipation- Xray  abdomen shows moderate amount of  fecal retention within large bowel from cecum to proximal descending colon. Will avoid giving meds for constipation at this time due to ongoing rectal bleeding.    DVT Prophylaxis-   SCD's  AM Labs Ordered, also please review Full Orders  Family Communication: Admission, patients condition and plan of care including tests being ordered have been discussed with the patient and his wife at bedside who indicate understanding and agree with the plan and Code Status.  Code Status:  Full code  Admission status: Observation  Time spent in minutes : 55 min   Jonie Burdell S M.D on 11/14/2016 at 7:42 PM  Between 7am to 7pm - Pager - [phone]. After 7pm go to [email or web address] - password [facility]  [facility] - Office  [phone]

## 2016-11-14 NOTE — ED Provider Notes (Signed)
[place] DEPT Provider Note   CSN: [id number] Arrival date & time: 11/14/16  1705     History   Chief Complaint Chief Complaint  Patient presents with  . Rectal Bleeding    HPI Cole Brown is a 71 y.o. male.  HPI  Pt was seen at 1745. Per pt, c/o gradual onset and worsening of persistent rectal bleeding for the past 3 days. Pt states he has been passing maroon stools and blood with BM's. States "now I'm only passing blood." Denies hx of same. Denies abd pain, no back pain, no N/V/D, no CP/SOB, no fevers.    GI: Rehman Past Medical History:  Diagnosis Date  . Diverticula of colon    2016 colonoscopy  . Fibromyalgia   . Fibromyalgia   . Hypercholesteremia   . Hypertension     There are no active problems to display for this patient.   Past Surgical History:  Procedure Laterality Date  . allergy shots  weekly  . CHOLECYSTECTOMY    . COLONOSCOPY     Dr.Rehman  . COLONOSCOPY N/A 01/27/2015   Procedure: COLONOSCOPY;  Surgeon: Rogene Houston, MD;  Location: [facility];  Service: Endoscopy;  Laterality: N/A;  930  . GALLBLADDER SURGERY  12/1993  . UPPER GASTROINTESTINAL ENDOSCOPY         Home Medications    Prior to Admission medications   Medication Sig Start Date End Date Taking? Authorizing Provider  acetaminophen (TYLENOL) 650 MG CR tablet Take 650 mg by mouth 4 (four) times daily.    [provider]  ANDROGEL PUMP 20.25 MG/ACT (1.62%) GEL Apply 2 each topically daily. Bilateral shoulders per application 12/11/92   [provider]  atenolol (TENORMIN) 50 MG tablet Take 50 mg by mouth every evening. 03/05/16   [provider]  cholecalciferol (VITAMIN D) 1000 units tablet Take 1,000 Units by mouth daily.    [provider]  diphenhydrAMINE (BENADRYL) 25 MG tablet Take 25 mg by mouth as needed for allergies.     [provider]  Ferrous Sulfate (SLOW FE PO) Take 1 tablet by mouth daily.    [provider]  fluticasone (FLONASE ALLERGY RELIEF) 50 MCG/ACT nasal spray Place 2 sprays into both nostrils daily as needed for allergies or rhinitis.     [provider]  Homeopathic Products (ARNICA) GEL Apply 1 application topically daily as needed (for pain). Arnica [place] GEL     [provider]  levocetirizine (XYZAL) 5 MG tablet Take 5 mg by mouth every morning.     [provider]  losartan (COZAAR) 50 MG tablet Take 50 mg by mouth every morning.     [provider]  Menthol-Methyl Salicylate (SALONPAS JET SPRAY EX) Apply 1 application topically daily as needed (for pain).    [provider]  modafinil (PROVIGIL) 200 MG tablet Take 100-200 mg by mouth every morning.    [provider]  naproxen sodium (ALEVE) 220 MG tablet Take 220-440 mg by mouth daily as needed (for pain).    [provider]  Olopatadine HCl (PATADAY) 0.2 % SOLN Apply 1 drop to eye daily as needed (eye irritation).     [provider]  Omega-3 Fatty Acids (FISH OIL) 1000 MG CAPS Take 1 capsule by mouth 2 (two) times daily.    [provider]  ranitidine (ZANTAC) 75 MG tablet Take 75 mg by mouth daily as needed for heartburn.     [provider]  sodium chloride (MURO 128) 5 % ophthalmic ointment Place 1 application into both eyes 2 (two) times daily.    [provider]  sodium chloride (MURO 128) 5 % ophthalmic solution Place 1 drop into both eyes daily as needed for irritation.     [provider]  traMADol (ULTRAM) 50 MG tablet Take 50 mg by mouth 4 (four) times daily as needed for moderate pain.     [provider]  traMADol (ULTRAM) 50 MG tablet Take 50 mg by mouth daily as needed for moderate pain.    [provider]  triazolam (HALCION) 0.25 MG tablet Take 0.125 mg by mouth at bedtime.     [provider]  trolamine salicylate (ASPERCREME) 10 % cream Apply 1 application topically as  needed for muscle pain.    [provider]    Family History Family History  Problem Relation Age of Onset  . Heart disease Mother   . Pancreatic cancer Mother   . Prostate cancer Father   . Healthy Sister   . Parkinson's disease Brother   . Asthma Sister   . Healthy Sister     Social History Social History  Substance Use Topics  . Smoking status: Never Smoker  . Smokeless tobacco: Never Used  . Alcohol use 0.0 oz/week     Comment: occas     Allergies   Lodine [etodolac]   Review of Systems Review of Systems ROS: Statement: All systems negative except as marked or noted in the HPI; Constitutional: Negative for fever and chills. ; ; Eyes: Negative for eye pain, redness and discharge. ; ; ENMT: Negative for ear pain, hoarseness, nasal congestion, sinus pressure and sore throat. ; ; Cardiovascular: Negative for chest pain, palpitations, diaphoresis, dyspnea and peripheral edema. ; ; Respiratory: Negative for cough, wheezing and stridor. ; ; Gastrointestinal: Negative for nausea, vomiting, diarrhea, abdominal pain, hematemesis, jaundice and +rectal bleeding. . ; ; Genitourinary: Negative for dysuria, flank pain and hematuria. ; ; Musculoskeletal: Negative for back pain and neck pain. Negative for swelling and trauma.; ; Skin: Negative for pruritus, rash, abrasions, blisters, bruising and skin lesion.; ; Neuro: Negative for headache, lightheadedness and neck stiffness. Negative for weakness, altered level of consciousness, altered mental status, extremity weakness, paresthesias, involuntary movement, seizure and syncope.       Physical Exam Updated Vital Signs BP (!) 169/85 (BP Location: Right Arm) Comment: pt states he takes bp meds in evening   Pulse 70   Temp 97.6 F (36.4 C) (Oral)   Resp 18   Ht 5\' 11"  (1.803 m)   Wt 88 kg (194 lb)   SpO2 98%   BMI 27.06 kg/m     Physical Exam 1750: Physical examination:  Nursing notes reviewed; Vital signs and O2 SAT  reviewed;  Constitutional: Well developed, Well nourished, Well hydrated, In no acute distress; Head:  Normocephalic, atraumatic; Eyes: EOMI, PERRL, No scleral icterus; ENMT: Mouth and pharynx normal, Mucous membranes moist; Neck: Supple, Full range of motion, No lymphadenopathy; Cardiovascular: Regular rate and rhythm, No gallop; Respiratory: Breath sounds clear & equal bilaterally, No wheezes.  Speaking full sentences with ease, Normal respiratory effort/excursion; Chest: Nontender, Movement normal; Abdomen: Soft, Nontender, Nondistended, Normal bowel sounds. Rectal exam performed w/permission of pt and ED RN chaperone present.  Anal tone normal.  Non-tender, maroon stool in rectal vault, heme positive.  No fissures, +external hemorrhoid without thrombosis or bleeding, no palp masses.; Genitourinary: No CVA tenderness; Extremities: Pulses normal, No tenderness, No  edema, No calf edema or asymmetry.; Neuro: AA&Ox3, Major CN grossly intact.  Speech clear. No gross focal motor or sensory deficits in extremities.; Skin: Color normal, Warm, Dry.   ED Treatments / Results  Labs (all labs ordered are listed, but only abnormal results are displayed)   EKG  EKG Interpretation None       Radiology   Procedures Procedures (including critical care time)  Medications Ordered in ED Medications - No data to display   Initial Impression / Assessment and Plan / ED Course  I have reviewed the triage vital signs and the nursing notes.  Pertinent labs & imaging results that were available during my care of the patient were reviewed by me and considered in my medical decision making (see chart for details).  MDM Reviewed: previous chart, nursing note and vitals Reviewed previous: labs Interpretation: labs and x-ray   Results for orders placed or performed during the hospital encounter of 11/14/16  Comprehensive metabolic panel  Result Value Ref Range   Sodium 134 (L) 135 - 145 mmol/L   Potassium  3.5 3.5 - 5.1 mmol/L   Chloride 102 101 - 111 mmol/L   CO2 24 22 - 32 mmol/L   Glucose, Bld 126 (H) 65 - 99 mg/dL   BUN 23 (H) 6 - 20 mg/dL   Creatinine, Ser 1.03 0.61 - 1.24 mg/dL   Calcium 9.0 8.9 - 10.3 mg/dL   Total Protein 7.2 6.5 - 8.1 g/dL   Albumin 3.9 3.5 - 5.0 g/dL   AST 17 15 - 41 U/L   ALT 15 (L) 17 - 63 U/L   Alkaline Phosphatase 82 38 - 126 U/L   Total Bilirubin 0.5 0.3 - 1.2 mg/dL   GFR calc non Af Amer >60 >60 mL/min   GFR calc Af Amer >60 >60 mL/min   Anion gap 8 5 - 15  CBC  Result Value Ref Range   WBC 9.4 4.0 - 10.5 K/uL   RBC 4.42 4.22 - 5.81 MIL/uL   Hemoglobin 13.2 13.0 - 17.0 g/dL   HCT 38.7 (L) 39.0 - 52.0 %   MCV 87.6 78.0 - 100.0 fL   MCH 29.9 26.0 - 34.0 pg   MCHC 34.1 30.0 - 36.0 g/dL   RDW 13.6 11.5 - 15.5 %   Platelets 212 150 - 400 K/uL  Differential  Result Value Ref Range   Neutrophils Relative % 72 %   Neutro Abs 6.7 1.7 - 7.7 K/uL   Lymphocytes Relative 18 %   Lymphs Abs 1.7 0.7 - 4.0 K/uL   Monocytes Relative 8 %   Monocytes Absolute 0.8 0.1 - 1.0 K/uL   Eosinophils Relative 2 %   Eosinophils Absolute 0.2 0.0 - 0.7 K/uL   Basophils Relative 0 %   Basophils Absolute 0.0 0.0 - 0.1 K/uL  POC occult blood, ED  Result Value Ref Range   Fecal Occult Bld POSITIVE (A) NEGATIVE  Type and screen [facility]  Result Value Ref Range   ABO/RH(D) O POS    Antibody Screen PENDING    Sample Expiration 11/17/2016    Dg Abd Acute W/chest Result Date: 11/14/2016 CLINICAL DATA:  Bright red blood per rectum after bowel movement on Monday. EXAM: DG ABDOMEN ACUTE W/ 1V CHEST COMPARISON:  None. FINDINGS: There is no evidence of dilated bowel loops or free intraperitoneal air. A moderate amount of fecal retention is seen within the ascending move proximal descending colon. Surgical clips are seen in the right  upper quadrant presumably from prior cholecystectomy. No radiopaque calculi or other significant radiographic abnormality is seen. Heart size  and mediastinal contours are within normal limits. Both lungs are clear. Nipple shadows are seen bilaterally. IMPRESSION: Moderate amount of fecal retention within large bowel from cecum through proximal descending colon. No bowel obstruction. No acute cardiopulmonary disease. Electronically Signed   By: Ashley Royalty M.D.   On: 11/14/2016 19:06    Results for TYKE, OUTMAN (MRN [id number]) as of 11/14/2016 19:15  Ref. Range 03/30/2016 13:58 11/14/2016 17:49  Hemoglobin Latest Ref Range: 13.0 - 17.0 g/dL 16.6 13.2  HCT Latest Ref Range: 39.0 - 52.0 % 47.0 38.7 (L)    1920:  H/H lower than previous. Abd remains benign, resps easy, NAD. Concern for diverticular bleed. Dx and testing d/w pt and family.  Questions answered.  Verb understanding, agreeable to admit. T/C to [facility] Dr. Darrick Meigs, case discussed, including:  HPI, pertinent PM/SHx, VS/PE, dx testing, ED course and treatment:  Agreeable to admit.   Final Clinical Impressions(s) / ED Diagnoses   Final diagnoses:  None    New Prescriptions New Prescriptions   No medications on file     Francine Graven, DO 11/15/16 1648

## 2016-11-14 NOTE — ED Triage Notes (Signed)
Pt reports bright red rectal bleeding when having bowel movement since Monday. Denies pain.  Pt reports large amount of bleeding twice today. Pt alert and oriented.

## 2016-11-15 DIAGNOSIS — K625 Hemorrhage of anus and rectum: Secondary | ICD-10-CM | POA: Diagnosis not present

## 2016-11-15 DIAGNOSIS — K921 Melena: Secondary | ICD-10-CM

## 2016-11-15 DIAGNOSIS — I10 Essential (primary) hypertension: Secondary | ICD-10-CM | POA: Diagnosis not present

## 2016-11-15 LAB — HEMOGLOBIN AND HEMATOCRIT, BLOOD
HCT: 35.3 % — ABNORMAL LOW (ref 39.0–52.0)
HCT: 36.3 % — ABNORMAL LOW (ref 39.0–52.0)
Hemoglobin: 12.1 g/dL — ABNORMAL LOW (ref 13.0–17.0)
Hemoglobin: 12.5 g/dL — ABNORMAL LOW (ref 13.0–17.0)

## 2016-11-15 LAB — CBC
HCT: 35.9 % — ABNORMAL LOW (ref 39.0–52.0)
Hemoglobin: 12 g/dL — ABNORMAL LOW (ref 13.0–17.0)
MCH: 29.3 pg (ref 26.0–34.0)
MCHC: 33.4 g/dL (ref 30.0–36.0)
MCV: 87.8 fL (ref 78.0–100.0)
Platelets: 228 10*3/uL (ref 150–400)
RBC: 4.09 MIL/uL — ABNORMAL LOW (ref 4.22–5.81)
RDW: 14 % (ref 11.5–15.5)
WBC: 7.3 10*3/uL (ref 4.0–10.5)

## 2016-11-15 LAB — COMPREHENSIVE METABOLIC PANEL
ALT: 16 U/L — ABNORMAL LOW (ref 17–63)
AST: 13 U/L — ABNORMAL LOW (ref 15–41)
Albumin: 3.5 g/dL (ref 3.5–5.0)
Alkaline Phosphatase: 69 U/L (ref 38–126)
Anion gap: 5 (ref 5–15)
BUN: 21 mg/dL — ABNORMAL HIGH (ref 6–20)
CO2: 26 mmol/L (ref 22–32)
Calcium: 8.7 mg/dL — ABNORMAL LOW (ref 8.9–10.3)
Chloride: 103 mmol/L (ref 101–111)
Creatinine, Ser: 0.93 mg/dL (ref 0.61–1.24)
GFR calc Af Amer: >60 mL/min (ref >60)
GFR calc non Af Amer: >60 mL/min (ref >60)
Glucose, Bld: 105 mg/dL — ABNORMAL HIGH (ref 65–99)
Potassium: 3.9 mmol/L (ref 3.5–5.1)
Sodium: 134 mmol/L — ABNORMAL LOW (ref 135–145)
Total Bilirubin: 0.6 mg/dL (ref 0.3–1.2)
Total Protein: 6.7 g/dL (ref 6.5–8.1)

## 2016-11-15 LAB — PROTIME-INR
INR: 1.19
Prothrombin Time: 15.2 seconds (ref 11.4–15.2)

## 2016-11-15 NOTE — Progress Notes (Signed)
PROGRESS NOTE    Cole Brown  BPZ:[id number] DOB: 1946-05-31 DOA: 11/14/2016 PCP: Redmond School, MD     Brief Narrative:  71 y/o man admitted from home on 5/7 due to rectal bleeding for 3 days. Hb on admission was 13.2 and admission was requested.   Assessment & Plan:   Active Problems:   Rectal bleed   Essential hypertension   Rectal Bleed -Likely is a painless diverticular bleed. -No further episodes since admission. -Hb has remained stable at around 12. -No transfusions unless Hb <7.0. -Seen by GI who plans on conservative management as long as no further bleeding overnight.  HTN -Fair control. -Continue home medications.   DVT prophylaxis: SCDs Code Status: full code Family Communication: patient only Disposition Plan: anticipate DC home in 24 hours  Consultants:   GI  Procedures:   None  Antimicrobials:  Anti-infectives    None       Subjective: No further bleeding since admission. No abdominal pain.  Objective: Vitals:   11/15/16 0011 11/15/16 0638 11/15/16 0815 11/15/16 1422  BP: 116/80 123/82 131/83 137/89  Pulse:  (!) 50 (!) 53 (!) 58  Resp:  18 16 20   Temp:  97.6 F (36.4 C) 97.5 F (36.4 C) 97.7 F (36.5 C)  TempSrc:  Oral Oral   SpO2:  98% 98% 98%  Weight:      Height:        Intake/Output Summary (Last 24 hours) at 11/15/16 1502 Last data filed at 11/15/16 1300  Gross per 24 hour  Intake           540.83 ml  Output              550 ml  Net            -9.17 ml   Filed Weights   11/14/16 1711 11/14/16 2114  Weight: 88 kg (194 lb) 87.3 kg (192 lb 8 oz)    Examination:  General exam: Alert, awake, oriented x 3 Respiratory system: Clear to auscultation. Respiratory effort normal. Cardiovascular system:RRR. No murmurs, rubs, gallops. Gastrointestinal system: Abdomen is nondistended, soft and nontender. No organomegaly or masses felt. Normal bowel sounds heard. Central nervous system: Alert and oriented. No focal  neurological deficits. Extremities: No C/C/E, +pedal pulses Skin: No rashes, lesions or ulcers Psychiatry: Judgement and insight appear normal. Mood & affect appropriate.     Data Reviewed: I have personally reviewed following labs and imaging studies  CBC:  Recent Labs Lab 11/14/16 1749 11/15/16 0034 11/15/16 0628  WBC 9.4  --  7.3  NEUTROABS 6.7  --   --   HGB 13.2 12.1* 12.0*  HCT 38.7* 35.3* 35.9*  MCV 87.6  --  87.8  PLT 212  --  242   Basic Metabolic Panel:  Recent Labs Lab 11/14/16 1749 11/15/16 0628  NA 134* 134*  K 3.5 3.9  CL 102 103  CO2 24 26  GLUCOSE 126* 105*  BUN 23* 21*  CREATININE 1.03 0.93  CALCIUM 9.0 8.7*   GFR: Estimated Creatinine Clearance: 75.3 mL/min (by C-G formula based on SCr of 0.93 mg/dL). Liver Function Tests:  Recent Labs Lab 11/14/16 1749 11/15/16 0628  AST 17 13*  ALT 15* 16*  ALKPHOS 82 69  BILITOT 0.5 0.6  PROT 7.2 6.7  ALBUMIN 3.9 3.5   No results for input(s): LIPASE, AMYLASE in the last 168 hours. No results for input(s): AMMONIA in the last 168 hours. Coagulation Profile: No results for input(s):  INR, PROTIME in the last 168 hours. Cardiac Enzymes: No results for input(s): CKTOTAL, CKMB, CKMBINDEX, TROPONINI in the last 168 hours. BNP (last 3 results) No results for input(s): PROBNP in the last 8760 hours. HbA1C: No results for input(s): HGBA1C in the last 72 hours. CBG: No results for input(s): GLUCAP in the last 168 hours. Lipid Profile: No results for input(s): CHOL, HDL, LDLCALC, TRIG, CHOLHDL, LDLDIRECT in the last 72 hours. Thyroid Function Tests: No results for input(s): TSH, T4TOTAL, FREET4, T3FREE, THYROIDAB in the last 72 hours. Anemia Panel: No results for input(s): VITAMINB12, FOLATE, FERRITIN, TIBC, IRON, RETICCTPCT in the last 72 hours. Urine analysis: No results found for: COLORURINE, APPEARANCEUR, LABSPEC, PHURINE, GLUCOSEU, HGBUR, BILIRUBINUR, KETONESUR, PROTEINUR, UROBILINOGEN, NITRITE,  LEUKOCYTESUR Sepsis Labs: @LABRCNTIP (procalcitonin:4,lacticidven:4)  )No results found for this or any previous visit (from the past 240 hour(s)).       Radiology Studies: Dg Abd Acute W/chest  Result Date: 11/14/2016 CLINICAL DATA:  Bright red blood per rectum after bowel movement on Monday. EXAM: DG ABDOMEN ACUTE W/ 1V CHEST COMPARISON:  None. FINDINGS: There is no evidence of dilated bowel loops or free intraperitoneal air. A moderate amount of fecal retention is seen within the ascending move proximal descending colon. Surgical clips are seen in the right upper quadrant presumably from prior cholecystectomy. No radiopaque calculi or other significant radiographic abnormality is seen. Heart size and mediastinal contours are within normal limits. Both lungs are clear. Nipple shadows are seen bilaterally. IMPRESSION: Moderate amount of fecal retention within large bowel from cecum through proximal descending colon. No bowel obstruction. No acute cardiopulmonary disease. Electronically Signed   By: Ashley Royalty M.D.   On: 11/14/2016 19:06        Scheduled Meds: . atenolol  50 mg Oral QPM  . cholecalciferol  1,000 Units Oral Daily  . famotidine  20 mg Oral Daily  . loratadine  10 mg Oral Daily  . losartan  50 mg Oral q morning - 10a  . modafinil  100 mg Oral q morning - 10a  . triazolam  0.125 mg Oral QHS   Continuous Infusions: . sodium chloride 50 mL/hr at 11/14/16 2132     LOS: 0 days    Time spent: 25 minutes. Greater than 50% of this time was spent in direct contact with the patient coordinating care.     Lelon Frohlich, MD [facility] Pager [phone]  If 7PM-7AM, please contact night-coverage [email or web address] Password [email or web address] 11/15/2016, 3:02 PM

## 2016-11-15 NOTE — Consult Note (Signed)
Referring Provider: Thersa Salt, MD  Primary Care Physician:  Redmond School, MD Primary Gastroenterologist:  Dr. Laural Golden  Reason for Consultation:   Rectal bleeding.  HPI:   Patient is 71 year old Caucasian male who was in usual state of health until 3 days ago when he noted small amount of fresh blood with his bowel movement. He did not experience abdominal pain. He felt his stool was normal and that he was not constipated or had diarrhea. 2 days ago he noted fresh blood with bowel movement. Once again he did not have any other associated symptoms. Did not take Aleve which she had taken for nights ago. Yesterday he had multiple bowel movements. He passed bright red blood per rectum. He did not feel lightheaded or dizzy. He also did not experience nausea vomiting abdominal pain. Patient came to emergency room he was evaluated and hospitalized. Admission hemoglobin was 13.2 g and this morning it dropped to 12 g. He feels fine. He has not had a BM since 6 PM yesterday which means he has not had a bowel movement since 6 PM yesterday. He does not recall doing unusual physical activity the day before. He does not take aspirin. He takes Aleve for fibromyalgia. He is doing physical therapy while program at [facility]. He feels hungry. His last colonoscopy was on 01/27/2015 revealing multiple diverticula at sigmoid colon and external hemorrhoids. His family history is negative for CRC or IBD.  Past Medical History:  Diagnosis Date  . Diverticula of colon    2016 colonoscopy  .    Marland Kitchen Fibromyalgia   . Hypercholesteremia   . Hypertension     Past Surgical History:  Procedure Laterality Date  . allergy shots  weekly  . CHOLECYSTECTOMY    . COLONOSCOPY     Dr.Colyn Miron  . COLONOSCOPY N/A 01/27/2015   Procedure: COLONOSCOPY;  Surgeon: Rogene Houston, MD;  Location: [facility];  Service: Endoscopy;  Laterality: N/A;  930  . GALLBLADDER SURGERY  12/1993  . UPPER GASTROINTESTINAL ENDOSCOPY       Prior to Admission medications   Medication Sig Start Date End Date Taking? Authorizing Provider  acetaminophen (TYLENOL) 650 MG CR tablet Take 650 mg by mouth 4 (four) times daily.   Yes [provider]  ANDROGEL PUMP 20.25 MG/ACT (1.62%) GEL Apply 2 each topically daily. Bilateral shoulders per application 06/19/70  Yes [provider]  atenolol (TENORMIN) 50 MG tablet Take 50 mg by mouth every evening. 03/05/16  Yes [provider]  azelastine (OPTIVAR) 0.05 % ophthalmic solution Place 1 drop into both eyes 2 (two) times daily.   Yes [provider]  cholecalciferol (VITAMIN D) 1000 units tablet Take 1,000 Units by mouth daily.   Yes [provider]  diphenhydrAMINE (BENADRYL) 25 MG tablet Take 25 mg by mouth as needed for allergies.    Yes [provider]  Ferrous Sulfate (SLOW FE PO) Take 1 tablet by mouth daily.   Yes [provider]  fluticasone (FLONASE ALLERGY RELIEF) 50 MCG/ACT nasal spray Place 2 sprays into both nostrils daily as needed for allergies or rhinitis.    Yes [provider]  Homeopathic Products (ARNICA) GEL Apply 1 application topically daily as needed (for pain). Arnica Motanna GEL    Yes [provider]  levocetirizine (XYZAL) 5 MG tablet Take 5 mg by mouth every morning.    Yes [provider]  losartan (COZAAR) 50 MG tablet Take 50 mg by mouth every morning.  Yes [provider]  modafinil (PROVIGIL) 200 MG tablet Take 100-200 mg by mouth every morning.   Yes [provider]  naproxen sodium (ALEVE) 220 MG tablet Take 220-440 mg by mouth daily as needed (for pain).   Yes [provider]  Omega-3 Fatty Acids (FISH OIL) 1000 MG CAPS Take 1 capsule by mouth 2 (two) times daily.   Yes [provider]  ranitidine (ZANTAC) 75 MG tablet Take 75 mg by mouth daily as needed for heartburn.    Yes [provider]  sodium chloride (MURO 128) 5  % ophthalmic ointment Place 1 application into both eyes 2 (two) times daily.   Yes [provider]  traMADol (ULTRAM) 50 MG tablet Take 50 mg by mouth daily as needed for moderate pain.   Yes [provider]  triazolam (HALCION) 0.25 MG tablet Take 0.125 mg by mouth at bedtime.    Yes [provider]  trolamine salicylate (ASPERCREME) 10 % cream Apply 1 application topically as needed for muscle pain.   Yes [provider]    Current Facility-Administered Medications  Medication Dose Route Frequency Provider Last Rate Last Dose  . 0.9 %  sodium chloride infusion   Intravenous Continuous Oswald Hillock, MD 50 mL/hr at 11/14/16 2132    . acetaminophen (TYLENOL) tablet 650 mg  650 mg Oral Q6H PRN Oswald Hillock, MD       Or  . acetaminophen (TYLENOL) suppository 650 mg  650 mg Rectal Q6H PRN Oswald Hillock, MD      . atenolol (TENORMIN) tablet 50 mg  50 mg Oral QPM Oswald Hillock, MD   50 mg at 11/14/16 2147  . cholecalciferol (VITAMIN D) tablet 1,000 Units  1,000 Units Oral Daily Oswald Hillock, MD   1,000 Units at 11/15/16 [phone]  . famotidine (PEPCID) tablet 20 mg  20 mg Oral Daily Oswald Hillock, MD   20 mg at 11/15/16 0945  . fluticasone (FLONASE) 50 MCG/ACT nasal spray 2 spray  2 spray Each Nare Daily PRN Oswald Hillock, MD      . hydrALAZINE (APRESOLINE) tablet 25 mg  25 mg Oral Q6H PRN Oswald Hillock, MD      . loratadine (CLARITIN) tablet 10 mg  10 mg Oral Daily Oswald Hillock, MD   10 mg at 11/15/16 0945  . losartan (COZAAR) tablet 50 mg  50 mg Oral q morning - 10a Oswald Hillock, MD   50 mg at 11/15/16 0944  . modafinil (PROVIGIL) tablet 100 mg  100 mg Oral q morning - 10a Darrick Meigs, Marge Duncans, MD      . ondansetron [facility]) tablet 4 mg  4 mg Oral Q6H PRN Oswald Hillock, MD       Or  . ondansetron [facility]) injection 4 mg  4 mg Intravenous Q6H PRN Oswald Hillock, MD      . triazolam (HALCION) tablet 0.125 mg  0.125 mg Oral QHS Oswald Hillock, MD   0.125 mg at 11/14/16  2311    Allergies as of 11/14/2016 - Review Complete 11/14/2016  Allergen Reaction Noted  . Lodine [etodolac] Swelling and Rash 01/29/2013    Family History  Problem Relation Age of Onset  . Heart disease Mother   . Pancreatic cancer Mother   . Prostate cancer Father   . Healthy Sister   . Parkinson's disease Brother   . Asthma Sister   . Healthy Sister  Social History   Social History  . Marital status: Married    Spouse name: N/A  . Number of children: N/A  . Years of education: N/A   Occupational History  . He is retired. 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    Social History Main Topics  . Smoking status: Never Smoker  . Smokeless tobacco: Never Used  . Alcohol use 0.0 oz/week     Comment: occas  . Drug use: No      Review of Systems: See HPI, otherwise normal ROS  Physical Exam: Temp:  [97.5 F (36.4 C)-97.7 F (36.5 C)] 97.5 F (36.4 C) (05/31 0815) Pulse Rate:  [50-70] 53 (05/31 0815) Resp:  [11-18] 16 (05/31 0815) BP: (116-188)/(80-110) 131/83 (05/31 0815) SpO2:  [96 %-100 %] 98 % (05/31 0815) Weight:  [192 lb 8 oz (87.3 kg)-194 lb (88 kg)] 192 lb 8 oz (87.3 kg) (05/30 2114) Last BM Date: 11/14/16 Pleasant well-developed well-nourished Caucasian male in NAD. Conjunctiva is pink. Sclerae nonicteric. Oropharyngeal mucosa is normal. No neck masses or thyromegaly noted. Cardiac exam with regular rhythm normal S1 and S2. No murmur or gallop noted. Lungs are clear to auscultation. Abdomen is full. It is symmetrical. Bowel sounds are hyperactive. On palpation abdomen is soft and nontender without organomegaly masses. No peripheral edema or clubbing noted.   Lab Results:  Recent Labs  11/14/16 1749 11/15/16 0034 11/15/16 0628  WBC 9.4  --  7.3  HGB 13.2 12.1* 12.0*  HCT 38.7* 35.3* 35.9*  PLT 212  --  228   BMET  Recent Labs  11/14/16 1749 11/15/16 0628  NA 134* 134*  K 3.5 3.9  CL 102 103  CO2 24 26  GLUCOSE 126* 105*  BUN  23* 21*  CREATININE 1.03 0.93  CALCIUM 9.0 8.7*   LFT  Recent Labs  11/15/16 0628  PROT 6.7  ALBUMIN 3.5  AST 13*  ALT 16*  ALKPHOS 69  BILITOT 0.6    Studies/Results: Dg Abd Acute W/chest  Result Date: 11/14/2016 CLINICAL DATA:  Bright red blood per rectum after bowel movement on Monday. EXAM: DG ABDOMEN ACUTE W/ 1V CHEST COMPARISON:  None. FINDINGS: There is no evidence of dilated bowel loops or free intraperitoneal air. A moderate amount of fecal retention is seen within the ascending move proximal descending colon. Surgical clips are seen in the right upper quadrant presumably from prior cholecystectomy. No radiopaque calculi or other significant radiographic abnormality is seen. Heart size and mediastinal contours are within normal limits. Both lungs are clear. Nipple shadows are seen bilaterally. IMPRESSION: Moderate amount of fecal retention within large bowel from cecum through proximal descending colon. No bowel obstruction. No acute cardiopulmonary disease. Electronically Signed   By: Ashley Royalty M.D.   On: 11/14/2016 19:06    Assessment;  Patient is 71 year old Caucasian male who presents with large volume painless hematochezia. Patient has known history of sigmoid colon diverticulosis(last colonoscopy August 2016). Suspect colonic diverticular bleed in the setting of regular NSAID use. Patient is hemodynamically stable. He has not had a BM in about 16 hours and he therefore appears to have stopped bleeding. Hemoglobin has dropped by more than 1 g since admission.  Recommendations;  Full liquid diet. No further workup unless rectal bleeding recurs. Patient advised not to take NSAIDs for 2 weeks and thereafter use it sparingly if possible. Will check INR with next blood draw.   LOS: 0 days   Rahil Passey  11/15/2016, 10:52 AM

## 2016-11-15 NOTE — Progress Notes (Addendum)
Patient called nurse to room to see stool, blood noted in stool, dark red in color, slight odor. Dr. Laural Golden made aware.

## 2016-11-15 NOTE — Plan of Care (Signed)
Problem: Bowel/Gastric: Goal: Will show no signs and symptoms of gastrointestinal bleeding Outcome: Progressing Pt admitted for Melena and rectal bleed. Pt has had no BM since being admitted from ED. Pt stated last BM he had in ED had little blood and pt thinks his bleed is resolving. Pt states he thinks Dr. Laural Golden will tell him that this is from his diverticulosos. Pt currently NPO until GI consult in am. Hemoglobin stable, currently awaiting results of 0100 am draw. Will continue to monitor pt

## 2016-11-15 NOTE — Care Management Obs Status (Signed)
[place] NOTIFICATION   Patient Details  Name: Cole Brown MRN: [id number] Date of Birth: [birth year]   Medicare Observation Status Notification Given:  Yes    Sherald Barge, RN 11/15/2016, 11:21 AM

## 2016-11-15 NOTE — Care Management Note (Signed)
Case Management Note  Patient Details  Name: Cole Brown MRN: [id number] Date of Birth: February 04, 1946  Subjective/Objective:                  Pt admitted with diverticulitis. He is from home, lives with his spouse and is ind with ADL's he has PCP, transportation and insurance with drug coverage.   Action/Plan: Plan for return home. No CM needs communicated.   Expected Discharge Date:       11/16/2016           Expected Discharge Plan:  Home/Self Care  In-House Referral:  NA  Discharge planning Services  CM Consult  Post Acute Care Choice:  NA Choice offered to:  NA  Status of Service:  Completed, signed off  Sherald Barge, RN 11/15/2016, 11:22 AM

## 2016-11-16 DIAGNOSIS — K921 Melena: Secondary | ICD-10-CM | POA: Diagnosis not present

## 2016-11-16 DIAGNOSIS — I10 Essential (primary) hypertension: Secondary | ICD-10-CM | POA: Diagnosis not present

## 2016-11-16 LAB — CBC
HCT: 36.7 % — ABNORMAL LOW (ref 39.0–52.0)
Hemoglobin: 12.6 g/dL — ABNORMAL LOW (ref 13.0–17.0)
MCH: 30.2 pg (ref 26.0–34.0)
MCHC: 34.3 g/dL (ref 30.0–36.0)
MCV: 88 fL (ref 78.0–100.0)
Platelets: 237 10*3/uL (ref 150–400)
RBC: 4.17 MIL/uL — ABNORMAL LOW (ref 4.22–5.81)
RDW: 13.9 % (ref 11.5–15.5)
WBC: 8 10*3/uL (ref 4.0–10.5)

## 2016-11-16 NOTE — Progress Notes (Signed)
Late entry for today's care:  Patient called me to come down to his room to see an bloody BM that he had.  After assessing the stool I found that the patient had a medium bloody stool.  Voiced to the patient after reviewing Dr. Olevia Perches progress note the bleeding is in regards to his diverticulosis and that he is stable AEB no significant drop in his HGB.  Voiced to him that I would notify Dr. Laural Golden just to let him know about the situation.  Patient verbalized understanding.  Saw Dr. Laural Golden and voiced to him the above.  He states that if the patient has another stool to notify him.  There were no more stools reported to me prior to discharge.  Patient was stable at this time.  Patient discharged with instructions, prescription, and care notes.  Verbalized understanding via teach back.  IV was removed and the site was WNL. Patient voiced no further complaints or concerns at the time of discharge.  Appointments per instructions: notify MD on Monday since his office was already closed at discharge.  Patient left the floor via w/c family  And staff in stable condition.

## 2016-11-16 NOTE — Discharge Summary (Signed)
Physician Discharge Summary  [name]:[id number] DOB: July 23, 1945 DOA: 11/14/2016  PCP: Redmond School, MD  Admit date: 11/14/2016 Discharge date: 11/16/2016  Time spent: 45 minutes  Recommendations for Outpatient Follow-up:  -Will be discharged home today. -Advised to follow up with Dr. Laural Golden in 2 weeks.   Discharge Diagnoses:  Active Problems:   Rectal bleed   Essential hypertension   Discharge Condition: Stable and improved  Filed Weights   11/14/16 1711 11/14/16 2114  Weight: 88 kg (194 lb) 87.3 kg (192 lb 8 oz)    History of present illness:  As per Dr. Darrick Meigs on 5/30: Cole Brown  is a 71 y.o. male, who came to the hospital with chief complaint of rectal bleeding for past 3 days. Patient says that he has history of diverticulosis and external hemorrhoids found on colonoscopy in 2016, done by Dr. Laural Golden. He has been having 2-3 episodes of bloody bowel movements. It improved on Tuesday but started again today so he came to ED. He denies abdominal pain. Denies passing out or blurred vision. Denies nausea or vomiting. Denies diarrhea. Complains of occasional constipation. He denies chest pain. No shortness of breath.  In the ED Hemoglobin- 13.2, hematocrit 38.7 Blood pressure 169/105 BUN-23 Creatinine 1.03  Hospital Course:   Rectal Bleed -Likely is a painless diverticular bleed. -No further episodes since admission. -Hb has remained stable at around 12. -No transfusions unless Hb <7.0. -Seen by GI who plans on conservative management as long as no further bleeding overnight.  HTN -Fair control. -Continue home medications.  Procedures:  None   Consultations:  GI, Dr. Laural Golden  Discharge Instructions  Discharge Instructions    Diet - low sodium heart healthy    Complete by:  As directed    Increase activity slowly    Complete by:  As directed      Allergies as of 11/16/2016      Reactions   Lodine [etodolac] Swelling, Rash        Medication List    STOP taking these medications   ALEVE 220 MG tablet Generic drug:  naproxen sodium     TAKE these medications   acetaminophen 650 MG CR tablet Commonly known as:  TYLENOL Take 650 mg by mouth 4 (four) times daily.   ANDROGEL PUMP 20.25 MG/ACT (1.62%) Gel Generic drug:  Testosterone Apply 2 each topically daily. Bilateral shoulders per application   Arnica Gel Apply 1 application topically daily as needed (for pain). Arnica Motanna GEL   atenolol 50 MG tablet Commonly known as:  TENORMIN Take 50 mg by mouth every evening.   azelastine 0.05 % ophthalmic solution Commonly known as:  OPTIVAR Place 1 drop into both eyes 2 (two) times daily.   cholecalciferol 1000 units tablet Commonly known as:  VITAMIN D Take 1,000 Units by mouth daily.   diphenhydrAMINE 25 MG tablet Commonly known as:  BENADRYL Take 25 mg by mouth as needed for allergies.   Fish Oil 1000 MG Caps Take 1 capsule by mouth 2 (two) times daily.   FLONASE ALLERGY RELIEF 50 MCG/ACT nasal spray Generic drug:  fluticasone Place 2 sprays into both nostrils daily as needed for allergies or rhinitis.   levocetirizine 5 MG tablet Commonly known as:  XYZAL Take 5 mg by mouth every morning.   losartan 50 MG tablet Commonly known as:  COZAAR Take 50 mg by mouth every morning.   modafinil 200 MG tablet Commonly known as:  PROVIGIL Take 100-200 mg  by mouth every morning.   MURO 128 5 % ophthalmic ointment Generic drug:  sodium chloride Place 1 application into both eyes 2 (two) times daily.   ranitidine 75 MG tablet Commonly known as:  ZANTAC Take 75 mg by mouth daily as needed for heartburn.   SLOW FE PO Take 1 tablet by mouth daily.   traMADol 50 MG tablet Commonly known as:  ULTRAM Take 50 mg by mouth daily as needed for moderate pain.   triazolam 0.25 MG tablet Commonly known as:  HALCION Take 0.125 mg by mouth at bedtime.   trolamine salicylate 10 % cream Commonly known  as:  ASPERCREME Apply 1 application topically as needed for muscle pain.      Allergies  Allergen Reactions  . Lodine [Etodolac] Swelling and Rash   Follow-up Information    Rehman, Mechele Dawley, MD. Schedule an appointment as soon as possible for a visit in 2 week(s).   Specialty:  Gastroenterology Contact information: [place], [address] [place] [address] [phone]            The results of significant diagnostics from this hospitalization (including imaging, microbiology, ancillary and laboratory) are listed below for reference.    Significant Diagnostic Studies: Dg Abd Acute W/chest  Result Date: 11/14/2016 CLINICAL DATA:  Bright red blood per rectum after bowel movement on Monday. EXAM: DG ABDOMEN ACUTE W/ 1V CHEST COMPARISON:  None. FINDINGS: There is no evidence of dilated bowel loops or free intraperitoneal air. A moderate amount of fecal retention is seen within the ascending move proximal descending colon. Surgical clips are seen in the right upper quadrant presumably from prior cholecystectomy. No radiopaque calculi or other significant radiographic abnormality is seen. Heart size and mediastinal contours are within normal limits. Both lungs are clear. Nipple shadows are seen bilaterally. IMPRESSION: Moderate amount of fecal retention within large bowel from cecum through proximal descending colon. No bowel obstruction. No acute cardiopulmonary disease. Electronically Signed   By: Ashley Royalty M.D.   On: 11/14/2016 19:06    Microbiology: No results found for this or any previous visit (from the past 240 hour(s)).   Labs: Basic Metabolic Panel:  Recent Labs Lab 11/14/16 1749 11/15/16 0628  NA 134* 134*  K 3.5 3.9  CL 102 103  CO2 24 26  GLUCOSE 126* 105*  BUN 23* 21*  CREATININE 1.03 0.93  CALCIUM 9.0 8.7*   Liver Function Tests:  Recent Labs Lab 11/14/16 1749 11/15/16 0628  AST 17 13*  ALT 15* 16*  ALKPHOS 82 69  BILITOT 0.5 0.6  PROT 7.2  6.7  ALBUMIN 3.9 3.5   No results for input(s): LIPASE, AMYLASE in the last 168 hours. No results for input(s): AMMONIA in the last 168 hours. CBC:  Recent Labs Lab 11/14/16 1749 11/15/16 0034 11/15/16 0628 11/15/16 1801 11/16/16 0418  WBC 9.4  --  7.3  --  8.0  NEUTROABS 6.7  --   --   --   --   HGB 13.2 12.1* 12.0* 12.5* 12.6*  HCT 38.7* 35.3* 35.9* 36.3* 36.7*  MCV 87.6  --  87.8  --  88.0  PLT 212  --  228  --  237   Cardiac Enzymes: No results for input(s): CKTOTAL, CKMB, CKMBINDEX, TROPONINI in the last 168 hours. BNP: BNP (last 3 results) No results for input(s): BNP in the last 8760 hours.  ProBNP (last 3 results) No results for input(s): PROBNP in the last 8760 hours.  CBG: No results for input(s): GLUCAP in the last 168 hours.     SignedLelon Frohlich  [facility] Pager: [phone] 11/16/2016, 2:57 PM

## 2016-11-16 NOTE — Progress Notes (Signed)
  Subjective:  Patient has no complaints. Had a bowel movement this morning and noted small amount of lung. He voided later and noted scant amount of blood and tissue and he passed flatus. He complains of vague right low quadrant abdominal pain. He states is had this pain for 7 months.  Objective: Blood pressure 122/74, pulse (!) 57, temperature 98 F (36.7 C), temperature source Oral, resp. rate 20, height 5\' 11"  (1.803 m), weight 192 lb 8 oz (87.3 kg), SpO2 99 %. Patient is alert and in no acute distress. Abdomen is soft and nontender without organomegaly or masses.   Labs/studies Results:   Recent Labs  11/14/16 1749  11/15/16 0628 11/15/16 1801 11/16/16 0418  WBC 9.4  --  7.3  --  8.0  HGB 13.2  < > 12.0* 12.5* 12.6*  HCT 38.7*  < > 35.9* 36.3* 36.7*  PLT 212  --  228  --  237  < > = values in this interval not displayed.  BMET   Recent Labs  11/14/16 1749 11/15/16 0628  NA 134* 134*  K 3.5 3.9  CL 102 103  CO2 24 26  GLUCOSE 126* 105*  BUN 23* 21*  CREATININE 1.03 0.93  CALCIUM 9.0 8.7*    LFT   Recent Labs  11/14/16 1749 11/15/16 0628  PROT 7.2 6.7  ALBUMIN 3.9 3.5  AST 17 13*  ALT 15* 16*  ALKPHOS 82 69  BILITOT 0.5 0.6    PT/INR   Recent Labs  11/15/16 1801  LABPROT 15.2  INR 1.19     Assessment:  #1. LGI Most likely secondary to colonic diverticulosis valve documented on last colonoscopy of August 2016. Hemoglobin is increased from 12-12.6 g in the last 24 hours. It appears he has stopped bleeding. Unless things change he can go home after lunch. #2. Mild anemia secondary to GI bleed.   Recommendations:  Diet advanced to heart healthy diet. Patient advised not to take NSAIDs for at least one week. Patient remains stable he can be discharged after lunch.

## 2016-11-19 DIAGNOSIS — J3089 Other allergic rhinitis: Secondary | ICD-10-CM | POA: Diagnosis not present

## 2016-11-19 DIAGNOSIS — J301 Allergic rhinitis due to pollen: Secondary | ICD-10-CM | POA: Diagnosis not present

## 2016-11-19 DIAGNOSIS — J3081 Allergic rhinitis due to animal (cat) (dog) hair and dander: Secondary | ICD-10-CM | POA: Diagnosis not present

## 2016-11-26 DIAGNOSIS — H25813 Combined forms of age-related cataract, bilateral: Secondary | ICD-10-CM | POA: Diagnosis not present

## 2016-11-26 DIAGNOSIS — H1851 Endothelial corneal dystrophy: Secondary | ICD-10-CM | POA: Diagnosis not present

## 2016-11-28 ENCOUNTER — Ambulatory Visit (INDEPENDENT_AMBULATORY_CARE_PROVIDER_SITE_OTHER): Payer: Medicare HMO | Admitting: Internal Medicine

## 2016-11-28 ENCOUNTER — Encounter (INDEPENDENT_AMBULATORY_CARE_PROVIDER_SITE_OTHER): Payer: Self-pay | Admitting: Internal Medicine

## 2016-11-28 ENCOUNTER — Other Ambulatory Visit (INDEPENDENT_AMBULATORY_CARE_PROVIDER_SITE_OTHER): Payer: Self-pay | Admitting: *Deleted

## 2016-11-28 VITALS — BP 110/80 | HR 63 | Temp 97.5°F | Resp 18 | Ht 72.0 in | Wt 191.0 lb

## 2016-11-28 DIAGNOSIS — Z8719 Personal history of other diseases of the digestive system: Secondary | ICD-10-CM

## 2016-11-28 DIAGNOSIS — K625 Hemorrhage of anus and rectum: Secondary | ICD-10-CM

## 2016-11-28 LAB — CBC WITH DIFFERENTIAL/PLATELET
Basophils Absolute: 0 cells/uL (ref 0–200)
Basophils Relative: 0 %
Eosinophils Absolute: 98 cells/uL (ref 15–500)
Eosinophils Relative: 1 %
HCT: 36.9 % — ABNORMAL LOW (ref 38.5–50.0)
Hemoglobin: 12.1 g/dL — ABNORMAL LOW (ref 13.2–17.1)
Lymphocytes Relative: 12 %
Lymphs Abs: 1176 cells/uL (ref 850–3900)
MCH: 28.7 pg (ref 27.0–33.0)
MCHC: 32.8 g/dL (ref 32.0–36.0)
MCV: 87.4 fL (ref 80.0–100.0)
MPV: 9.2 fL (ref 7.5–12.5)
Monocytes Absolute: 980 cells/uL — ABNORMAL HIGH (ref 200–950)
Monocytes Relative: 10 %
Neutro Abs: 7546 cells/uL (ref 1500–7800)
Neutrophils Relative %: 77 %
Platelets: 276 10*3/uL (ref 140–400)
RBC: 4.22 MIL/uL (ref 4.20–5.80)
RDW: 14.5 % (ref 11.0–15.0)
WBC: 9.8 10*3/uL (ref 3.8–10.8)

## 2016-11-28 NOTE — Patient Instructions (Signed)
OV in 6 months. CBC in 3 months.  

## 2016-11-28 NOTE — Progress Notes (Signed)
Subjective:    Patient ID: Cole Brown, male    DOB: [birth year], 71 y.o.   MRN: [id number]  HPI Here today for f/u. Admitted to [facility] last of May with rectal bleeding x 3 days. Hx of diverticulosis and external hemorrhoids on colonoscopy in 2016 by Dr. Laural Golden.  There was no abdominal pain. Hemoglobin remained stable during hospital stay. Probable diverticular bleed.  He had no further bleeding while in hospital.  He tells me he is doing good. No rectal bleeding. Appetite is okay.  BM x 1 day.   Review of Systems Past Medical History:  Diagnosis Date  . Diverticula of colon    2016 colonoscopy  . Fibromyalgia   . Fibromyalgia   . Hypercholesteremia   . Hypertension     Past Surgical History:  Procedure Laterality Date  . allergy shots  weekly  . CHOLECYSTECTOMY    . COLONOSCOPY     Dr.Rehman  . COLONOSCOPY N/A 01/27/2015   Procedure: COLONOSCOPY;  Surgeon: Rogene Houston, MD;  Location: [facility];  Service: Endoscopy;  Laterality: N/A;  930  . GALLBLADDER SURGERY  12/1993  . UPPER GASTROINTESTINAL ENDOSCOPY      Allergies  Allergen Reactions  . Lodine [Etodolac] Swelling and Rash    Current Outpatient Prescriptions on File Prior to Visit  Medication Sig Dispense Refill  . acetaminophen (TYLENOL) 650 MG CR tablet Take 650 mg by mouth 4 (four) times daily.    . ANDROGEL PUMP 20.25 MG/ACT (1.62%) GEL Apply 2 each topically daily. Bilateral shoulders per application    . atenolol (TENORMIN) 50 MG tablet Take 50 mg by mouth every evening.    Marland Kitchen azelastine (OPTIVAR) 0.05 % ophthalmic solution Place 1 drop into both eyes 2 (two) times daily.    . cholecalciferol (VITAMIN D) 1000 units tablet Take 1,000 Units by mouth daily.    . diphenhydrAMINE (BENADRYL) 25 MG tablet Take 25 mg by mouth as needed for allergies.     . Ferrous Sulfate (SLOW FE PO) Take 1 tablet by mouth daily.    . fluticasone (FLONASE ALLERGY RELIEF) 50 MCG/ACT nasal spray Place 2 sprays into both  nostrils daily as needed for allergies or rhinitis.     . Homeopathic Products (ARNICA) GEL Apply 1 application topically daily as needed (for pain). Arnica Motanna GEL     . levocetirizine (XYZAL) 5 MG tablet Take 5 mg by mouth every evening.     Marland Kitchen losartan (COZAAR) 50 MG tablet Take 50 mg by mouth every morning.     . modafinil (PROVIGIL) 200 MG tablet Take 100-200 mg by mouth every morning.    . Omega-3 Fatty Acids (FISH OIL) 1000 MG CAPS Take 1 capsule by mouth 2 (two) times daily.    . ranitidine (ZANTAC) 75 MG tablet Take 75 mg by mouth daily as needed for heartburn.     . sodium chloride (MURO 128) 5 % ophthalmic ointment Place 1 application into both eyes 2 (two) times daily.    . traMADol (ULTRAM) 50 MG tablet Take 50 mg by mouth daily as needed for moderate pain.    Marland Kitchen triazolam (HALCION) 0.25 MG tablet Take 0.125 mg by mouth at bedtime.     . trolamine salicylate (ASPERCREME) 10 % cream Apply 1 application topically as needed for muscle pain.     No current facility-administered medications on file prior to visit.         Objective:   Physical Exam Blood pressure  110/80, pulse 63, temperature 97.5 F (36.4 C), temperature source Oral, resp. rate 18, height 6' (1.829 m), weight 191 lb (86.6 kg). Alert and oriented. Skin warm and dry. Oral mucosa is moist.   . Sclera anicteric, conjunctivae is pink. Thyroid not enlarged. No cervical lymphadenopathy. Lungs clear. Heart regular rate and rhythm.  Abdomen is soft. Bowel sounds are positive. No hepatomegaly. No abdominal masses felt. No tenderness.  No edema to lower  extremities. .         Assessment & Plan:  Diverticular bleed resolved. Will check CBC today.  OV in 6 months.

## 2016-11-30 DIAGNOSIS — J3081 Allergic rhinitis due to animal (cat) (dog) hair and dander: Secondary | ICD-10-CM | POA: Diagnosis not present

## 2016-11-30 DIAGNOSIS — J301 Allergic rhinitis due to pollen: Secondary | ICD-10-CM | POA: Diagnosis not present

## 2016-11-30 DIAGNOSIS — J3089 Other allergic rhinitis: Secondary | ICD-10-CM | POA: Diagnosis not present

## 2016-12-03 DIAGNOSIS — J301 Allergic rhinitis due to pollen: Secondary | ICD-10-CM | POA: Diagnosis not present

## 2016-12-03 DIAGNOSIS — J3081 Allergic rhinitis due to animal (cat) (dog) hair and dander: Secondary | ICD-10-CM | POA: Diagnosis not present

## 2016-12-03 DIAGNOSIS — J3089 Other allergic rhinitis: Secondary | ICD-10-CM | POA: Diagnosis not present

## 2016-12-04 ENCOUNTER — Other Ambulatory Visit (INDEPENDENT_AMBULATORY_CARE_PROVIDER_SITE_OTHER): Payer: Self-pay | Admitting: *Deleted

## 2016-12-04 DIAGNOSIS — K625 Hemorrhage of anus and rectum: Secondary | ICD-10-CM

## 2016-12-12 DIAGNOSIS — Z6826 Body mass index (BMI) 26.0-26.9, adult: Secondary | ICD-10-CM | POA: Diagnosis not present

## 2016-12-12 DIAGNOSIS — R51 Headache: Secondary | ICD-10-CM | POA: Diagnosis not present

## 2016-12-12 DIAGNOSIS — K219 Gastro-esophageal reflux disease without esophagitis: Secondary | ICD-10-CM | POA: Diagnosis not present

## 2016-12-12 DIAGNOSIS — I10 Essential (primary) hypertension: Secondary | ICD-10-CM | POA: Diagnosis not present

## 2016-12-12 DIAGNOSIS — J3081 Allergic rhinitis due to animal (cat) (dog) hair and dander: Secondary | ICD-10-CM | POA: Diagnosis not present

## 2016-12-12 DIAGNOSIS — J301 Allergic rhinitis due to pollen: Secondary | ICD-10-CM | POA: Diagnosis not present

## 2016-12-12 DIAGNOSIS — J3089 Other allergic rhinitis: Secondary | ICD-10-CM | POA: Diagnosis not present

## 2016-12-14 ENCOUNTER — Ambulatory Visit (INDEPENDENT_AMBULATORY_CARE_PROVIDER_SITE_OTHER): Payer: Medicare HMO | Admitting: Neurology

## 2016-12-14 ENCOUNTER — Encounter: Payer: Self-pay | Admitting: Neurology

## 2016-12-14 ENCOUNTER — Other Ambulatory Visit: Payer: Medicare HMO

## 2016-12-14 VITALS — BP 124/84 | HR 59 | Ht 71.0 in | Wt 199.6 lb

## 2016-12-14 DIAGNOSIS — H9313 Tinnitus, bilateral: Secondary | ICD-10-CM

## 2016-12-14 DIAGNOSIS — R42 Dizziness and giddiness: Secondary | ICD-10-CM

## 2016-12-14 DIAGNOSIS — G44229 Chronic tension-type headache, not intractable: Secondary | ICD-10-CM

## 2016-12-14 DIAGNOSIS — G609 Hereditary and idiopathic neuropathy, unspecified: Secondary | ICD-10-CM | POA: Diagnosis not present

## 2016-12-14 LAB — TSH: TSH: 0.99 mIU/L (ref 0.40–4.50)

## 2016-12-14 NOTE — Patient Instructions (Signed)
1.  We will check MRI of brain and internal auditory canals with and without contrast 2.  We will also check a nerve conduction study of the legs to evaluate for neuropathy 3.  We will check labs for common causes of neuropathy:  ANA, Sed Rate, B12, folate, TSH 4.  Follow up after testing.

## 2016-12-14 NOTE — Progress Notes (Signed)
NEUROLOGY CONSULTATION NOTE  Cole Brown MRN: [id number] DOB: [birth year]  Referring provider: Redmond School, MD Primary care provider: Redmond School, MD  Reason for consult:  Headache, tinnitus  HISTORY OF PRESENT ILLNESS: Cole Brown is a 71 year old  man with hypertension and fibromyalgia who presents for headache and tinnitus.  History supplemented by PCP note.  Patient has multiple complaints which have gradually progressed over the past several years. 1.  He has episodes of tinnitus in both ears.  Sometimes, they are associated with visual auras consisting of jagged lines.  They are also associated with chest discomfort, lightheadedness and palpitations.  He denies hearing loss, nausea or aural fullness.  2.  He also has low-grade pressure headaches in band-like distribution, lasting 4 to 5 hours at the end of the day and occurring daily.  He uses nasal spray for sinus daily.  3.  He reports pain in the extremities with burning in the feet and ankles.  Light touch of his feet or ankles is painful.  PAST MEDICAL HISTORY: Past Medical History:  Diagnosis Date  . Diverticula of colon    2016 colonoscopy  . Fibromyalgia   . Fibromyalgia   . Hypercholesteremia   . Hypertension     PAST SURGICAL HISTORY: Past Surgical History:  Procedure Laterality Date  . allergy shots  weekly  . CHOLECYSTECTOMY    . COLONOSCOPY     Dr.Rehman  . COLONOSCOPY N/A 01/27/2015   Procedure: COLONOSCOPY;  Surgeon: Rogene Houston, MD;  Location: [facility];  Service: Endoscopy;  Laterality: N/A;  930  . GALLBLADDER SURGERY  12/1993  . UPPER GASTROINTESTINAL ENDOSCOPY      MEDICATIONS: Current Outpatient Prescriptions on File Prior to Visit  Medication Sig Dispense Refill  . acetaminophen (TYLENOL) 650 MG CR tablet Take 650 mg by mouth 4 (four) times daily.    . ANDROGEL PUMP 20.25 MG/ACT (1.62%) GEL Apply 2 each topically daily. Bilateral shoulders per application    .  atenolol (TENORMIN) 50 MG tablet Take 50 mg by mouth every evening.    Marland Kitchen azelastine (OPTIVAR) 0.05 % ophthalmic solution Place 1 drop into both eyes 2 (two) times daily.    . cholecalciferol (VITAMIN D) 1000 units tablet Take 1,000 Units by mouth daily.    . diphenhydrAMINE (BENADRYL) 25 MG tablet Take 25 mg by mouth as needed for allergies.     . Ferrous Sulfate (SLOW FE PO) Take 1 tablet by mouth daily.    . fluticasone (FLONASE ALLERGY RELIEF) 50 MCG/ACT nasal spray Place 2 sprays into both nostrils daily as needed for allergies or rhinitis.     . Homeopathic Products (ARNICA) GEL Apply 1 application topically daily as needed (for pain). Arnica Motanna GEL     . HYDROcodone-acetaminophen (NORCO/VICODIN) 5-325 MG tablet Take 1 tablet by mouth every 6 (six) hours as needed.     Marland Kitchen levocetirizine (XYZAL) 5 MG tablet Take 5 mg by mouth every evening.     Marland Kitchen losartan (COZAAR) 50 MG tablet Take 50 mg by mouth every morning.     . modafinil (PROVIGIL) 200 MG tablet Take 100-200 mg by mouth every morning.    . Omega-3 Fatty Acids (FISH OIL) 1000 MG CAPS Take 1 capsule by mouth 2 (two) times daily.    . ranitidine (ZANTAC) 75 MG tablet Take 75 mg by mouth daily as needed for heartburn.     . sodium chloride (MURO 128) 5 % ophthalmic ointment Place 1  application into both eyes 2 (two) times daily.    . traMADol (ULTRAM) 50 MG tablet Take 50 mg by mouth daily as needed for moderate pain.    Marland Kitchen triazolam (HALCION) 0.25 MG tablet Take 0.125 mg by mouth at bedtime.     . trolamine salicylate (ASPERCREME) 10 % cream Apply 1 application topically as needed for muscle pain.     No current facility-administered medications on file prior to visit.     ALLERGIES: Allergies  Allergen Reactions  . Lodine [Etodolac] Swelling and Rash    FAMILY HISTORY: Family History  Problem Relation Age of Onset  . Heart disease Mother   . Pancreatic cancer Mother   . Prostate cancer Father   . Healthy Sister   .  Parkinson's disease Brother   . Asthma Sister   . Healthy Sister     SOCIAL HISTORY: Social History   Social History  . Marital status: Married    Spouse name: N/A  . Number of children: N/A  . Years of education: N/A   Occupational History  . Not on file.   Social History Main Topics  . Smoking status: Never Smoker  . Smokeless tobacco: Never Used  . Alcohol use 0.0 oz/week     Comment: occas  . Drug use: No  . Sexual activity: Not on file   Other Topics Concern  . Not on file   Social History Narrative  . No narrative on file    REVIEW OF SYSTEMS: Constitutional: No fevers, chills, or sweats, no generalized fatigue, change in appetite Eyes: No visual changes, double vision, eye pain Ear, nose and throat: No hearing loss, ear pain, nasal congestion, sore throat Cardiovascular: No chest pain, palpitations Respiratory:  No shortness of breath at rest or with exertion, wheezes GastrointestinaI: No nausea, vomiting, diarrhea, abdominal pain, fecal incontinence Genitourinary:  No dysuria, urinary retention or frequency Musculoskeletal:  No neck pain, back pain Integumentary: No rash, pruritus, skin lesions Neurological: as above Psychiatric: No depression, insomnia, anxiety Endocrine: No palpitations, fatigue, diaphoresis, mood swings, change in appetite, change in weight, increased thirst Hematologic/Lymphatic:  No purpura, petechiae. Allergic/Immunologic: no itchy/runny eyes, nasal congestion, recent allergic reactions, rashes  PHYSICAL EXAM: Vitals:   12/14/16 1452  BP: 124/84  Pulse: (!) 59   General: No acute distress.  Patient appears well-groomed.  Head:  Normocephalic/atraumatic Eyes:  fundi examined but not visualized Neck: supple, no paraspinal tenderness, full range of motion Back: No paraspinal tenderness Heart: regular rate and rhythm Lungs: Clear to auscultation bilaterally. Vascular: No carotid bruits. Neurological Exam: Mental status: alert  and oriented to person, place, and time, recent and remote memory intact, fund of knowledge intact, attention and concentration intact, speech fluent and not dysarthric, language intact. Cranial nerves: CN I: not tested CN II: pupils equal, round and reactive to light, visual fields intact CN III, IV, VI:  full range of motion, no nystagmus, no ptosis CN V: facial sensation intact CN VII: upper and lower face symmetric CN VIII: hearing intact CN IX, X: gag intact, uvula midline CN XI: sternocleidomastoid and trapezius muscles intact CN XII: tongue midline Bulk & Tone: normal, no fasciculations. Motor:  5/5 throughout  Sensation:  Temperature sensation intact; vibration sensation decreased in feet. Deep Tendon Reflexes:  2+ throughout, toes downgoing. Finger to nose testing:  Without dysmetria.  Heel to shin:  Without dysmetria.  Gait:  Normal station and stride.  Able to turn and tandem walk. Romberg negative.  IMPRESSION: 1.  Episodes of tinnitus. Sometimes associated with visual aura, which may be a migraine (although he denies history of migraine).  2.  Chronic tension type headache 3.  He exhibits symptoms of peripheral neuropathy.  However, I am not sure how much is related to his fibromyalgia.  PLAN: 1.  We will check MRI of brain and internal auditory canals with and without contrast 2.  We will also check a nerve conduction study of the legs to evaluate for neuropathy 3.  We will check labs for common causes of neuropathy:  ANA, Sed Rate, B12, folate, TSH 4.  Follow up after testing. 5.  If workup negative, PCP may want to consider cardiac workup (given associated chest discomfort and palpitations).  Thank you for allowing me to take part in the care of this patient.  Metta Clines, DO  CC:  Redmond School, MD

## 2016-12-15 LAB — SEDIMENTATION RATE: Sed Rate: 13 mm/hr (ref 0–20)

## 2016-12-15 LAB — FOLATE: Folate: 9.2 ng/mL (ref >5.4)

## 2016-12-15 LAB — VITAMIN B12: Vitamin B-12: 367 pg/mL (ref 200–1100)

## 2016-12-17 DIAGNOSIS — J3081 Allergic rhinitis due to animal (cat) (dog) hair and dander: Secondary | ICD-10-CM | POA: Diagnosis not present

## 2016-12-17 DIAGNOSIS — J3089 Other allergic rhinitis: Secondary | ICD-10-CM | POA: Diagnosis not present

## 2016-12-17 DIAGNOSIS — J301 Allergic rhinitis due to pollen: Secondary | ICD-10-CM | POA: Diagnosis not present

## 2016-12-17 LAB — ANA: Anti Nuclear Antibody(ANA): POSITIVE — AB

## 2016-12-17 LAB — ANTI-NUCLEAR AB-TITER (ANA TITER): ANA Titer 1: 1:320 {titer} — ABNORMAL HIGH

## 2016-12-18 ENCOUNTER — Telehealth: Payer: Self-pay

## 2016-12-18 DIAGNOSIS — G44229 Chronic tension-type headache, not intractable: Secondary | ICD-10-CM

## 2016-12-18 DIAGNOSIS — G609 Hereditary and idiopathic neuropathy, unspecified: Secondary | ICD-10-CM

## 2016-12-18 DIAGNOSIS — H9313 Tinnitus, bilateral: Secondary | ICD-10-CM

## 2016-12-18 NOTE — Telephone Encounter (Signed)
Lm on vm regarding labs:  Per Dr. Tomi Likens - The only positive lab is a positive ANA, which is nonspecific for an inflammatory condition. I would like to check Sjogren's antibodies (SSA and SSB antibodies) and double stranded-DNA antibodies (dsDNA).   I said that I had put these orders in and patient could come in at his convenience to have them drawn.

## 2016-12-18 NOTE — Telephone Encounter (Signed)
See phone note

## 2016-12-21 ENCOUNTER — Other Ambulatory Visit: Payer: Medicare HMO

## 2016-12-21 ENCOUNTER — Other Ambulatory Visit: Payer: Self-pay | Admitting: Emergency Medicine

## 2016-12-21 DIAGNOSIS — R768 Other specified abnormal immunological findings in serum: Secondary | ICD-10-CM

## 2016-12-21 DIAGNOSIS — K625 Hemorrhage of anus and rectum: Secondary | ICD-10-CM

## 2016-12-24 LAB — SJOGRENS SYNDROME-A EXTRACTABLE NUCLEAR ANTIBODY: SSA (Ro) (ENA) Antibody, IgG: <1

## 2016-12-24 LAB — SJOGRENS SYNDROME-B EXTRACTABLE NUCLEAR ANTIBODY: SSB (La) (ENA) Antibody, IgG: <1

## 2016-12-25 LAB — ANA+ENA+DNA/DS+SCL 70+SJOSSA/B
ANA Titer 1: NEGATIVE
ENA RNP Ab: <0.2 AI (ref 0.0–0.9)
ENA SM Ab Ser-aCnc: <0.2 AI (ref 0.0–0.9)
ENA SSA (RO) Ab: <0.2 AI (ref 0.0–0.9)
ENA SSB (LA) Ab: <0.2 AI (ref 0.0–0.9)
Scleroderma SCL-70: <0.2 AI (ref 0.0–0.9)
dsDNA Ab: <1 IU/mL (ref 0–9)

## 2016-12-25 LAB — CBC WITH DIFFERENTIAL
Basophils Absolute: 0 x10E3/uL (ref 0.0–0.2)
Basos: 1 %
EOS (ABSOLUTE): 0.1 x10E3/uL (ref 0.0–0.4)
Eos: 2 %
Hematocrit: 37.7 % (ref 37.5–51.0)
Hemoglobin: 12.7 g/dL — ABNORMAL LOW (ref 13.0–17.7)
Immature Grans (Abs): 0 x10E3/uL (ref 0.0–0.1)
Immature Granulocytes: 0 %
Lymphocytes Absolute: 1.3 x10E3/uL (ref 0.7–3.1)
Lymphs: 22 %
MCH: 28.7 pg (ref 26.6–33.0)
MCHC: 33.7 g/dL (ref 31.5–35.7)
MCV: 85 fL (ref 79–97)
Monocytes Absolute: 0.4 x10E3/uL (ref 0.1–0.9)
Monocytes: 7 %
Neutrophils Absolute: 4.1 x10E3/uL (ref 1.4–7.0)
Neutrophils: 68 %
RBC: 4.43 x10E6/uL (ref 4.14–5.80)
RDW: 14.9 % (ref 12.3–15.4)
WBC: 6 x10E3/uL (ref 3.4–10.8)

## 2016-12-28 ENCOUNTER — Telehealth: Payer: Self-pay | Admitting: Neurology

## 2016-12-28 DIAGNOSIS — J3081 Allergic rhinitis due to animal (cat) (dog) hair and dander: Secondary | ICD-10-CM | POA: Diagnosis not present

## 2016-12-28 DIAGNOSIS — J3089 Other allergic rhinitis: Secondary | ICD-10-CM | POA: Diagnosis not present

## 2016-12-28 DIAGNOSIS — R05 Cough: Secondary | ICD-10-CM | POA: Diagnosis not present

## 2016-12-28 DIAGNOSIS — J301 Allergic rhinitis due to pollen: Secondary | ICD-10-CM | POA: Diagnosis not present

## 2016-12-28 NOTE — Telephone Encounter (Signed)
Patient had lab work done and he was told the results were negative but he would like to know what exactly the test were? Please call. Thanks

## 2016-12-28 NOTE — Telephone Encounter (Signed)
Left message for patient to call me back. 

## 2016-12-29 ENCOUNTER — Ambulatory Visit
Admission: RE | Admit: 2016-12-29 | Discharge: 2016-12-29 | Disposition: A | Discharge status: Home / Self Care | Payer: Medicare HMO | Source: Ambulatory Visit | Attending: Neurology | Admitting: Neurology

## 2016-12-29 DIAGNOSIS — G609 Hereditary and idiopathic neuropathy, unspecified: Secondary | ICD-10-CM

## 2016-12-29 DIAGNOSIS — H9313 Tinnitus, bilateral: Secondary | ICD-10-CM

## 2016-12-29 DIAGNOSIS — R51 Headache: Secondary | ICD-10-CM | POA: Diagnosis not present

## 2016-12-29 DIAGNOSIS — R42 Dizziness and giddiness: Secondary | ICD-10-CM

## 2016-12-29 DIAGNOSIS — G44229 Chronic tension-type headache, not intractable: Secondary | ICD-10-CM

## 2016-12-29 MED ORDER — GADOBENATE DIMEGLUMINE 529 MG/ML IV SOLN
18.0000 mL | Freq: Once | INTRAVENOUS | Status: AC | PRN
  Administered 2016-12-29: 18 mL via INTRAVENOUS

## 2017-01-01 DIAGNOSIS — J3089 Other allergic rhinitis: Secondary | ICD-10-CM | POA: Diagnosis not present

## 2017-01-01 DIAGNOSIS — J3081 Allergic rhinitis due to animal (cat) (dog) hair and dander: Secondary | ICD-10-CM | POA: Diagnosis not present

## 2017-01-01 DIAGNOSIS — J301 Allergic rhinitis due to pollen: Secondary | ICD-10-CM | POA: Diagnosis not present

## 2017-01-02 ENCOUNTER — Ambulatory Visit (INDEPENDENT_AMBULATORY_CARE_PROVIDER_SITE_OTHER): Payer: Medicare HMO | Admitting: Cardiology

## 2017-01-02 ENCOUNTER — Encounter: Payer: Self-pay | Admitting: Cardiology

## 2017-01-02 ENCOUNTER — Telehealth: Payer: Self-pay | Admitting: Cardiology

## 2017-01-02 VITALS — BP 152/93 | HR 63 | Ht 71.0 in | Wt 194.0 lb

## 2017-01-02 DIAGNOSIS — I10 Essential (primary) hypertension: Secondary | ICD-10-CM | POA: Diagnosis not present

## 2017-01-02 DIAGNOSIS — R0789 Other chest pain: Secondary | ICD-10-CM

## 2017-01-02 DIAGNOSIS — R002 Palpitations: Secondary | ICD-10-CM | POA: Diagnosis not present

## 2017-01-02 DIAGNOSIS — R011 Cardiac murmur, unspecified: Secondary | ICD-10-CM | POA: Diagnosis not present

## 2017-01-02 NOTE — Progress Notes (Signed)
Clinical Summary [name] is a 71 y.o.male seen as a new referral, referred by Dr Gerarda Fraction for palpitations and chest pain  1. Palpitations - feeling of heart beating strong. Had been occurring 2-3 times a week. Happen at rest. No other associated symptoms - started new allergy medicine with improvement.   2. Chest pain - chest tightness episode about 3 weeks ago. Chest tightness midchest, felt heavy. Felt lightheaded, confused. No palpitations at the time. Tightness lasted about 20 minutes. Went away on its own.  - has had other episodes of tightness since that time.  - denies any DOE. Sedentary lifestyle. - history of fibromyalgia per his report.   CAD risk factors: HTN, mother MI at 28  Past Medical History:  Diagnosis Date  . Diverticula of colon    2016 colonoscopy  . Fibromyalgia   . Fibromyalgia   . Hypercholesteremia   . Hypertension      Allergies  Allergen Reactions  . Lodine [Etodolac] Swelling and Rash     Current Outpatient Prescriptions  Medication Sig Dispense Refill  . acetaminophen (TYLENOL) 650 MG CR tablet Take 650 mg by mouth 4 (four) times daily.    . ANDROGEL PUMP 20.25 MG/ACT (1.62%) GEL Apply 2 each topically daily. Bilateral shoulders per application    . atenolol (TENORMIN) 50 MG tablet Take 50 mg by mouth every evening.    Marland Kitchen azelastine (OPTIVAR) 0.05 % ophthalmic solution Place 1 drop into both eyes 2 (two) times daily.    . cholecalciferol (VITAMIN D) 1000 units tablet Take 1,000 Units by mouth daily.    . diphenhydrAMINE (BENADRYL) 25 MG tablet Take 25 mg by mouth as needed for allergies.     . Ferrous Sulfate (SLOW FE PO) Take 1 tablet by mouth daily.    . fluticasone (FLONASE ALLERGY RELIEF) 50 MCG/ACT nasal spray Place 2 sprays into both nostrils daily as needed for allergies or rhinitis.     . Homeopathic Products (ARNICA) GEL Apply 1 application topically daily as needed (for pain). Arnica Motanna GEL     . HYDROcodone-acetaminophen  (NORCO/VICODIN) 5-325 MG tablet Take 1 tablet by mouth every 6 (six) hours as needed.     Marland Kitchen levocetirizine (XYZAL) 5 MG tablet Take 5 mg by mouth every evening.     Marland Kitchen losartan (COZAAR) 50 MG tablet Take 50 mg by mouth every morning.     . modafinil (PROVIGIL) 200 MG tablet Take 100-200 mg by mouth every morning.    . Omega-3 Fatty Acids (FISH OIL) 1000 MG CAPS Take 1 capsule by mouth 2 (two) times daily.    . ranitidine (ZANTAC) 75 MG tablet Take 75 mg by mouth daily as needed for heartburn.     . sodium chloride (MURO 128) 5 % ophthalmic ointment Place 1 application into both eyes 2 (two) times daily.    . traMADol (ULTRAM) 50 MG tablet Take 50 mg by mouth daily as needed for moderate pain.    Marland Kitchen triazolam (HALCION) 0.25 MG tablet Take 0.125 mg by mouth at bedtime.     . trolamine salicylate (ASPERCREME) 10 % cream Apply 1 application topically as needed for muscle pain.     No current facility-administered medications for this visit.      Past Surgical History:  Procedure Laterality Date  . allergy shots  weekly  . CHOLECYSTECTOMY    . COLONOSCOPY     Dr.Rehman  . COLONOSCOPY N/A 01/27/2015   Procedure: COLONOSCOPY;  Surgeon: Bernadene Person  Gloriann Loan, MD;  Location: [facility];  Service: Endoscopy;  Laterality: N/A;  930  . GALLBLADDER SURGERY  12/1993  . UPPER GASTROINTESTINAL ENDOSCOPY       Allergies  Allergen Reactions  . Lodine [Etodolac] Swelling and Rash      Family History  Problem Relation Age of Onset  . Heart disease Mother   . Pancreatic cancer Mother   . Prostate cancer Father   . Healthy Sister   . Parkinson's disease Brother   . Asthma Sister   . Healthy Sister      Social History Mr. Hillenburg reports that he has never smoked. He has never used smokeless tobacco. Mr. Winner reports that he drinks alcohol.   Review of Systems CONSTITUTIONAL: No weight loss, fever, chills, weakness or fatigue.  HEENT: Eyes: No visual loss, blurred vision, double vision or  yellow sclerae.No hearing loss, sneezing, congestion, runny nose or sore throat.  SKIN: No rash or itching.  CARDIOVASCULAR: per hpi RESPIRATORY: No shortness of breath, cough or sputum.  GASTROINTESTINAL: No anorexia, nausea, vomiting or diarrhea. No abdominal pain or blood.  GENITOURINARY: No burning on urination, no polyuria NEUROLOGICAL: No headache, dizziness, syncope, paralysis, ataxia, numbness or tingling in the extremities. No change in bowel or bladder control.  MUSCULOSKELETAL: No muscle, back pain, joint pain or stiffness.  LYMPHATICS: No enlarged nodes. No history of splenectomy.  PSYCHIATRIC: No history of depression or anxiety.  ENDOCRINOLOGIC: No reports of sweating, cold or heat intolerance. No polyuria or polydipsia.  Marland Kitchen   Physical Examination Vitals:   01/02/17 1326 01/02/17 1331  BP: (!) 160/79 (!) 152/93  Pulse: (!) 57 63   Vitals:   01/02/17 1326  Weight: 194 lb (88 kg)  Height: 5\' 11"  (1.803 m)    Gen: resting comfortably, no acute distress HEENT: no scleral icterus, pupils equal round and reactive, no palptable cervical adenopathy,  CV: RRR, 3/6 systolic murmur rusb, no jvd Resp: Clear to auscultation bilaterally GI: abdomen is soft, non-tender, non-distended, normal bowel sounds, no hepatosplenomegaly MSK: extremities are warm, no edema.  Skin: warm, no rash Neuro:  no focal deficits Psych: appropriate affect     Assessment and Plan  1. Palpitations - symptoms have improved he reports since change in allergy medications - ekg in clinic shows SR nonspecific ST/T changes - continue to monitor at this time, if reocurr or progress consider heart monitor  2. Heart murmur - obtain echo  3. Chest pain - difficult to get a very precise description of symptoms. Overall they do not sound consistent with cardiac etiology - f/u echo, consider stress echo pending progression of symptoms.   4. HTN - elevated in clinc, had been at goal at pcp visit  -  continue to monitor trend at this time.   F/u 6 weeks.       Arnoldo Lenis, M.D.

## 2017-01-02 NOTE — Patient Instructions (Signed)

## 2017-01-02 NOTE — Telephone Encounter (Signed)
Echo- heart murmur scheduled at [facility] 01/04/17 arrive at 8:15

## 2017-01-04 ENCOUNTER — Ambulatory Visit (HOSPITAL_COMMUNITY)
Admission: RE | Admit: 2017-01-04 | Discharge: 2017-01-04 | Disposition: A | Discharge status: Home / Self Care | Payer: Medicare HMO | Source: Ambulatory Visit | Attending: Cardiology | Admitting: Cardiology

## 2017-01-04 DIAGNOSIS — I10 Essential (primary) hypertension: Secondary | ICD-10-CM | POA: Diagnosis not present

## 2017-01-04 DIAGNOSIS — R011 Cardiac murmur, unspecified: Secondary | ICD-10-CM | POA: Insufficient documentation

## 2017-01-04 DIAGNOSIS — E785 Hyperlipidemia, unspecified: Secondary | ICD-10-CM | POA: Diagnosis not present

## 2017-01-04 DIAGNOSIS — I071 Rheumatic tricuspid insufficiency: Secondary | ICD-10-CM | POA: Insufficient documentation

## 2017-01-04 LAB — ECHOCARDIOGRAM COMPLETE
E decel time: 275 msec
E/e' ratio: 7.13
FS: 38 % (ref 28–44)
IVS/LV PW RATIO, ED: 1.31
LA ID, A-P, ES: 32 mm
LA diam end sys: 32 mm
LA diam index: 1.51 cm/m2
LA vol A4C: 46.3 ml
LA vol index: 25.9 mL/m2
LA vol: 54.7 mL
LV E/e' medial: 7.13
LV E/e'average: 7.13
LV PW d: 8.92 mm — AB (ref 0.6–1.1)
LV dias vol index: 45 mL/m2
LV dias vol: 96 mL (ref 62–150)
LV e' LATERAL: 11.4 cm/s
LVOT SV: 84 mL
LVOT VTI: 26.6 cm
LVOT area: 3.14 cm2
LVOT diameter: 20 mm
LVOT peak grad rest: 4 mmHg
LVOT peak vel: 99.9 cm/s
MV Dec: 275
MV Peak grad: 3 mmHg
MV pk A vel: 85.6 m/s
MV pk E vel: 81.3 m/s
RV sys press: 36 mmHg
Reg peak vel: 265 cm/s
TAPSE: 24.9 mm
TDI e' lateral: 11.4
TDI e' medial: 8.16
TR max vel: 265 cm/s

## 2017-01-04 NOTE — Progress Notes (Signed)
*  PRELIMINARY RESULTS* Echocardiogram 2D Echocardiogram has been performed.  Samuel Germany 01/04/2017, 8:51 AM

## 2017-01-08 ENCOUNTER — Encounter: Payer: Self-pay | Admitting: *Deleted

## 2017-01-08 ENCOUNTER — Telehealth: Payer: Self-pay | Admitting: *Deleted

## 2017-01-08 NOTE — Telephone Encounter (Signed)
Pt aware - requesting a letter from Dr Harl Bowie saying he is ok to exercise (participates in fibromyalgia therapy) routed to Dr Harl Bowie if ok compose letter

## 2017-01-08 NOTE — Telephone Encounter (Signed)
-----   Message from Arnoldo Lenis, MD sent at 01/08/2017 12:32 PM EDT ----- Echo looks good, normal heart function. We will discuss further at his f/u and reevaluate his symptoms at that time.   Zandra Abts MD

## 2017-01-08 NOTE — Telephone Encounter (Signed)
Letter mailed to pt as requested.

## 2017-01-08 NOTE — Telephone Encounter (Signed)
Ok to compose letter allowing him to exercise  Zandra Abts MD

## 2017-01-09 DIAGNOSIS — J3089 Other allergic rhinitis: Secondary | ICD-10-CM | POA: Diagnosis not present

## 2017-01-09 DIAGNOSIS — J301 Allergic rhinitis due to pollen: Secondary | ICD-10-CM | POA: Diagnosis not present

## 2017-01-09 DIAGNOSIS — J3081 Allergic rhinitis due to animal (cat) (dog) hair and dander: Secondary | ICD-10-CM | POA: Diagnosis not present

## 2017-01-15 ENCOUNTER — Ambulatory Visit (INDEPENDENT_AMBULATORY_CARE_PROVIDER_SITE_OTHER): Payer: Medicare HMO | Admitting: Neurology

## 2017-01-15 DIAGNOSIS — G609 Hereditary and idiopathic neuropathy, unspecified: Secondary | ICD-10-CM

## 2017-01-15 DIAGNOSIS — R42 Dizziness and giddiness: Secondary | ICD-10-CM

## 2017-01-15 DIAGNOSIS — H9313 Tinnitus, bilateral: Secondary | ICD-10-CM

## 2017-01-15 DIAGNOSIS — G44229 Chronic tension-type headache, not intractable: Secondary | ICD-10-CM

## 2017-01-15 NOTE — Procedures (Signed)
[facility]  [address], [address]  [place], [place] [address] Tel: [phone] Fax:  [phone] Test Date:  01/15/2017  Patient: Cole Brown DOB: Feb 13, 1946 Physician: Narda Amber, DO  Sex: Male Height: 5\' 11"  Ref Phys: Metta Clines, D.O.  ID#: [id number] Temp: 34.0C Technician:    Patient Complaints: This is a 71 year-old gentleman referred for evaluation of bilateral feet pain and paresthesias.  NCV & EMG Findings: Extensive electrodiagnostic testing of the right lower extremity and additional studies of the left shows:  1. Bilateral superficial peroneal and left sural sensory responses are absent.  Right sural sensory response shows reduced amplitude (2.0 V).   2. Bilateral peroneal and tibial motor responses are within normal limits. 3. Bilateral tibial H reflex studies are mildly prolonged. 4. There is no evidence of active or chronic motor axon loss changes affecting any of the tested muscles. Motor unit configuration and recruitment pattern is within normal limits.  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     ___________________________ Narda Amber, DO    Nerve Conduction Studies Anti Sensory Summary Table   Site NR Peak (ms) Norm Peak (ms) P-T Amp (V) Norm P-T Amp  Left Sup Peroneal Anti Sensory (Ant Lat Mall)  34C  12 cm NR  <4.6  >3  Right Sup Peroneal Anti Sensory (Ant Lat Mall)  34C  12 cm NR  <4.6  >3  Left Sural Anti Sensory (Lat Mall)  34C  Calf NR  <4.6  >3  Right Sural Anti Sensory (Lat Mall)  34C  Calf    3.7 <4.6 2.0 >3  Site 2    3.8  2.6    Motor Summary Table   Site NR Onset (ms) Norm Onset (ms) O-P Amp (mV) Norm O-P Amp Site1 Site2 Delta-0 (ms) Dist (cm) Vel (m/s) Norm Vel (m/s)  Left Peroneal Motor (Ext Dig Brev)  34C  Ankle    5.1 <6.0 4.3 >2.5 B Fib Ankle 9.2 37.0 40 >40  B Fib    14.3  3.7  Poplt B Fib 1.9 10.0 53 >40    Poplt    16.2  3.6         Right Peroneal Motor (Ext Dig Brev)  34C  Ankle    4.0 <6.0 3.5 >2.5 B Fib Ankle 9.3 38.0 41 >40  B Fib    13.3  2.9  Poplt B Fib 1.9 10.0 53 >40  Poplt    15.2  2.7         Left Tibial Motor (Abd Hall Brev)  34C  Ankle    4.1 <6.0 6.9 >4 Knee Ankle 10.6 42.0 40 >40  Knee    14.7  5.1         Right Tibial Motor (Abd Hall Brev)  34C  Ankle    4.0 <6.0 7.1 >4 Knee Ankle 9.8 40.0 41 >40  Knee    13.8  5.3          H Reflex Studies   NR H-Lat (ms) Lat Norm (ms) L-R H-Lat (ms)  Left Tibial (Gastroc)  34C     36.60 <35 0.00  Right Tibial (Gastroc)  34C     36.60 <35 0.00   EMG   Side Muscle Ins Act Fibs Psw Fasc Number Recrt Dur Dur. Amp Amp. Poly Poly. Comment  Right AntTibialis Nml Nml Nml Nml Nml Nml Nml Nml Nml Nml Nml Nml N/A  Right Gastroc Nml Nml Nml Nml Nml Nml Nml Nml Nml Nml Nml Nml N/A  Right Flex Dig Long Nml Nml Nml Nml Nml Nml Nml Nml Nml Nml Nml Nml N/A  Right RectFemoris Nml Nml Nml Nml Nml Nml Nml Nml Nml Nml Nml Nml N/A  Right GluteusMed Nml Nml Nml Nml Nml Nml Nml Nml Nml Nml Nml Nml N/A  Right BicepsFemS Nml Nml Nml Nml Nml Nml Nml Nml Nml Nml Nml Nml N/A  Left AntTibialis Nml Nml Nml Nml Nml Nml Nml Nml Nml Nml Nml Nml N/A  Left Gastroc Nml Nml Nml Nml Nml Nml Nml Nml Nml Nml Nml Nml N/A  Left Flex Dig Long Nml Nml Nml Nml Nml Nml Nml Nml Nml Nml Nml Nml N/A      Waveforms:

## 2017-01-16 DIAGNOSIS — J3081 Allergic rhinitis due to animal (cat) (dog) hair and dander: Secondary | ICD-10-CM | POA: Diagnosis not present

## 2017-01-16 DIAGNOSIS — J3089 Other allergic rhinitis: Secondary | ICD-10-CM | POA: Diagnosis not present

## 2017-01-16 DIAGNOSIS — J301 Allergic rhinitis due to pollen: Secondary | ICD-10-CM | POA: Diagnosis not present

## 2017-01-17 DIAGNOSIS — Z1389 Encounter for screening for other disorder: Secondary | ICD-10-CM | POA: Diagnosis not present

## 2017-01-17 DIAGNOSIS — E663 Overweight: Secondary | ICD-10-CM | POA: Diagnosis not present

## 2017-01-17 DIAGNOSIS — M797 Fibromyalgia: Secondary | ICD-10-CM | POA: Diagnosis not present

## 2017-01-17 DIAGNOSIS — J3089 Other allergic rhinitis: Secondary | ICD-10-CM | POA: Diagnosis not present

## 2017-01-17 DIAGNOSIS — G4700 Insomnia, unspecified: Secondary | ICD-10-CM | POA: Diagnosis not present

## 2017-01-17 DIAGNOSIS — Z6826 Body mass index (BMI) 26.0-26.9, adult: Secondary | ICD-10-CM | POA: Diagnosis not present

## 2017-01-17 DIAGNOSIS — E291 Testicular hypofunction: Secondary | ICD-10-CM | POA: Diagnosis not present

## 2017-01-18 ENCOUNTER — Telehealth: Payer: Self-pay

## 2017-01-18 NOTE — Telephone Encounter (Signed)
Spoke with Pt, advised of EMG/NCV results

## 2017-01-18 NOTE — Telephone Encounter (Signed)
-----   Message from Cameron Sprang, MD sent at 01/17/2017 11:51 AM EDT ----- Pls let him know the nerve test confirms neuropathy in both legs. Thanks

## 2017-01-23 DIAGNOSIS — J3089 Other allergic rhinitis: Secondary | ICD-10-CM | POA: Diagnosis not present

## 2017-01-23 DIAGNOSIS — J301 Allergic rhinitis due to pollen: Secondary | ICD-10-CM | POA: Diagnosis not present

## 2017-01-23 DIAGNOSIS — J3081 Allergic rhinitis due to animal (cat) (dog) hair and dander: Secondary | ICD-10-CM | POA: Diagnosis not present

## 2017-02-06 ENCOUNTER — Ambulatory Visit: Payer: Medicare HMO | Admitting: Cardiology

## 2017-02-20 DIAGNOSIS — J301 Allergic rhinitis due to pollen: Secondary | ICD-10-CM | POA: Diagnosis not present

## 2017-02-20 DIAGNOSIS — J3089 Other allergic rhinitis: Secondary | ICD-10-CM | POA: Diagnosis not present

## 2017-02-20 DIAGNOSIS — J3081 Allergic rhinitis due to animal (cat) (dog) hair and dander: Secondary | ICD-10-CM | POA: Diagnosis not present

## 2017-02-21 DIAGNOSIS — H25811 Combined forms of age-related cataract, right eye: Secondary | ICD-10-CM | POA: Diagnosis not present

## 2017-02-21 DIAGNOSIS — H25813 Combined forms of age-related cataract, bilateral: Secondary | ICD-10-CM | POA: Diagnosis not present

## 2017-02-21 DIAGNOSIS — H1851 Endothelial corneal dystrophy: Secondary | ICD-10-CM | POA: Diagnosis not present

## 2017-02-22 DIAGNOSIS — J3081 Allergic rhinitis due to animal (cat) (dog) hair and dander: Secondary | ICD-10-CM | POA: Diagnosis not present

## 2017-02-22 DIAGNOSIS — J301 Allergic rhinitis due to pollen: Secondary | ICD-10-CM | POA: Diagnosis not present

## 2017-02-22 DIAGNOSIS — J3089 Other allergic rhinitis: Secondary | ICD-10-CM | POA: Diagnosis not present

## 2017-02-25 DIAGNOSIS — J3089 Other allergic rhinitis: Secondary | ICD-10-CM | POA: Diagnosis not present

## 2017-02-25 DIAGNOSIS — J3081 Allergic rhinitis due to animal (cat) (dog) hair and dander: Secondary | ICD-10-CM | POA: Diagnosis not present

## 2017-02-25 DIAGNOSIS — J301 Allergic rhinitis due to pollen: Secondary | ICD-10-CM | POA: Diagnosis not present

## 2017-03-05 DIAGNOSIS — Z955 Presence of coronary angioplasty implant and graft: Secondary | ICD-10-CM | POA: Diagnosis not present

## 2017-03-05 DIAGNOSIS — I10 Essential (primary) hypertension: Secondary | ICD-10-CM | POA: Diagnosis not present

## 2017-03-05 DIAGNOSIS — Z83518 Family history of other specified eye disorder: Secondary | ICD-10-CM | POA: Diagnosis not present

## 2017-03-05 DIAGNOSIS — H25811 Combined forms of age-related cataract, right eye: Secondary | ICD-10-CM | POA: Diagnosis not present

## 2017-03-05 DIAGNOSIS — G4730 Sleep apnea, unspecified: Secondary | ICD-10-CM | POA: Diagnosis not present

## 2017-03-05 DIAGNOSIS — I2510 Atherosclerotic heart disease of native coronary artery without angina pectoris: Secondary | ICD-10-CM | POA: Diagnosis not present

## 2017-03-05 DIAGNOSIS — M797 Fibromyalgia: Secondary | ICD-10-CM | POA: Diagnosis not present

## 2017-03-05 DIAGNOSIS — H1851 Endothelial corneal dystrophy: Secondary | ICD-10-CM | POA: Diagnosis not present

## 2017-03-05 DIAGNOSIS — H259 Unspecified age-related cataract: Secondary | ICD-10-CM | POA: Diagnosis not present

## 2017-03-05 DIAGNOSIS — R001 Bradycardia, unspecified: Secondary | ICD-10-CM | POA: Diagnosis not present

## 2017-03-06 DIAGNOSIS — I10 Essential (primary) hypertension: Secondary | ICD-10-CM | POA: Diagnosis not present

## 2017-03-06 DIAGNOSIS — H25812 Combined forms of age-related cataract, left eye: Secondary | ICD-10-CM | POA: Diagnosis not present

## 2017-03-06 DIAGNOSIS — H1851 Endothelial corneal dystrophy: Secondary | ICD-10-CM | POA: Diagnosis not present

## 2017-03-06 DIAGNOSIS — Z961 Presence of intraocular lens: Secondary | ICD-10-CM | POA: Diagnosis not present

## 2017-03-06 DIAGNOSIS — Z947 Corneal transplant status: Secondary | ICD-10-CM | POA: Diagnosis not present

## 2017-03-06 DIAGNOSIS — Z9841 Cataract extraction status, right eye: Secondary | ICD-10-CM | POA: Diagnosis not present

## 2017-03-06 DIAGNOSIS — Z48810 Encounter for surgical aftercare following surgery on the sense organs: Secondary | ICD-10-CM | POA: Diagnosis not present

## 2017-03-06 DIAGNOSIS — Z79899 Other long term (current) drug therapy: Secondary | ICD-10-CM | POA: Diagnosis not present

## 2017-03-06 DIAGNOSIS — Z886 Allergy status to analgesic agent status: Secondary | ICD-10-CM | POA: Diagnosis not present

## 2017-03-11 DIAGNOSIS — J3089 Other allergic rhinitis: Secondary | ICD-10-CM | POA: Diagnosis not present

## 2017-03-11 DIAGNOSIS — J301 Allergic rhinitis due to pollen: Secondary | ICD-10-CM | POA: Diagnosis not present

## 2017-03-11 DIAGNOSIS — J3081 Allergic rhinitis due to animal (cat) (dog) hair and dander: Secondary | ICD-10-CM | POA: Diagnosis not present

## 2017-03-12 DIAGNOSIS — J301 Allergic rhinitis due to pollen: Secondary | ICD-10-CM | POA: Diagnosis not present

## 2017-03-12 DIAGNOSIS — J3089 Other allergic rhinitis: Secondary | ICD-10-CM | POA: Diagnosis not present

## 2017-03-12 DIAGNOSIS — J3081 Allergic rhinitis due to animal (cat) (dog) hair and dander: Secondary | ICD-10-CM | POA: Diagnosis not present

## 2017-03-13 DIAGNOSIS — Z888 Allergy status to other drugs, medicaments and biological substances status: Secondary | ICD-10-CM | POA: Diagnosis not present

## 2017-03-13 DIAGNOSIS — Z79899 Other long term (current) drug therapy: Secondary | ICD-10-CM | POA: Diagnosis not present

## 2017-03-13 DIAGNOSIS — H1851 Endothelial corneal dystrophy: Secondary | ICD-10-CM | POA: Diagnosis not present

## 2017-03-13 DIAGNOSIS — Z961 Presence of intraocular lens: Secondary | ICD-10-CM | POA: Diagnosis not present

## 2017-03-13 DIAGNOSIS — H25812 Combined forms of age-related cataract, left eye: Secondary | ICD-10-CM | POA: Diagnosis not present

## 2017-03-13 DIAGNOSIS — I10 Essential (primary) hypertension: Secondary | ICD-10-CM | POA: Diagnosis not present

## 2017-03-13 DIAGNOSIS — Z9841 Cataract extraction status, right eye: Secondary | ICD-10-CM | POA: Diagnosis not present

## 2017-03-13 DIAGNOSIS — Z48810 Encounter for surgical aftercare following surgery on the sense organs: Secondary | ICD-10-CM | POA: Diagnosis not present

## 2017-03-18 ENCOUNTER — Encounter: Payer: Self-pay | Admitting: Cardiology

## 2017-03-18 ENCOUNTER — Ambulatory Visit (INDEPENDENT_AMBULATORY_CARE_PROVIDER_SITE_OTHER): Payer: Medicare HMO | Admitting: Cardiology

## 2017-03-18 VITALS — BP 148/90 | HR 62 | Ht 71.0 in | Wt 196.2 lb

## 2017-03-18 DIAGNOSIS — I10 Essential (primary) hypertension: Secondary | ICD-10-CM | POA: Diagnosis not present

## 2017-03-18 DIAGNOSIS — R0789 Other chest pain: Secondary | ICD-10-CM

## 2017-03-18 DIAGNOSIS — J301 Allergic rhinitis due to pollen: Secondary | ICD-10-CM | POA: Diagnosis not present

## 2017-03-18 DIAGNOSIS — J3081 Allergic rhinitis due to animal (cat) (dog) hair and dander: Secondary | ICD-10-CM | POA: Diagnosis not present

## 2017-03-18 DIAGNOSIS — J3089 Other allergic rhinitis: Secondary | ICD-10-CM | POA: Diagnosis not present

## 2017-03-18 DIAGNOSIS — R002 Palpitations: Secondary | ICD-10-CM | POA: Diagnosis not present

## 2017-03-18 NOTE — Patient Instructions (Signed)

## 2017-03-18 NOTE — Progress Notes (Signed)
Clinical Summary Mr. Lewter is a 71 y.o.male seen today for follow up of the following medical problems.   1. Palpitations - feeling of heart beating strong. Had been occurring 2-3 times a week. Happen at rest. No other associated symptoms - started new allergy medicine with improvement.   - denies any recent symptoms.    2. Chest pain - chest tightness episode about 3 weeks ago. Chest tightness midchest, felt heavy. Felt lightheaded, confused. No palpitations at the time. Tightness lasted about 20 minutes. Went away on its own.  - has had other episodes of tightness since that time.  - denies any DOE. Sedentary lifestyle. - history of fibromyalgia per his report.  CAD risk factors: HTN, mother MI at 60   12/2016 echo: LVEF 60-65%, abnormal diastoilc function,  - no receurrent episodes. He reports better with additional allergy treatment.    Past Medical History:  Diagnosis Date  . Diverticula of colon    2016 colonoscopy  . Fibromyalgia   . Fibromyalgia   . Hypercholesteremia   . Hypertension      Allergies  Allergen Reactions  . Lodine [Etodolac] Swelling and Rash     Current Outpatient Prescriptions  Medication Sig Dispense Refill  . acetaminophen (TYLENOL) 650 MG CR tablet Take 650 mg by mouth 4 (four) times daily.    . ANDROGEL PUMP 20.25 MG/ACT (1.62%) GEL Apply 2 each topically daily. Bilateral shoulders per application    . atenolol (TENORMIN) 50 MG tablet Take 50 mg by mouth every evening.    Marland Kitchen azelastine (OPTIVAR) 0.05 % ophthalmic solution Place 1 drop into both eyes 2 (two) times daily.    . Azelastine-Fluticasone (DYMISTA) 137-50 MCG/ACT SUSP Place into the nose.    . cholecalciferol (VITAMIN D) 1000 units tablet Take 1,000 Units by mouth daily.    . diphenhydrAMINE (BENADRYL) 25 MG tablet Take 25 mg by mouth as needed for allergies.     . Ferrous Sulfate (SLOW FE PO) Take 1 tablet by mouth daily.    . fluticasone (FLONASE ALLERGY RELIEF) 50  MCG/ACT nasal spray Place 2 sprays into both nostrils daily as needed for allergies or rhinitis.     . Homeopathic Products (ARNICA) GEL Apply 1 application topically daily as needed (for pain). Arnica Motanna GEL     . HYDROcodone-acetaminophen (NORCO/VICODIN) 5-325 MG tablet Take 1 tablet by mouth every 6 (six) hours as needed.     Marland Kitchen levocetirizine (XYZAL) 5 MG tablet Take 5 mg by mouth every evening.     Marland Kitchen losartan (COZAAR) 50 MG tablet Take 50 mg by mouth every morning.     . modafinil (PROVIGIL) 200 MG tablet Take 100-200 mg by mouth every morning.    . Omega-3 Fatty Acids (FISH OIL) 1000 MG CAPS Take 1 capsule by mouth 2 (two) times daily.    . ranitidine (ZANTAC) 75 MG tablet Take 75 mg by mouth daily as needed for heartburn.     . sodium chloride (MURO 128) 5 % ophthalmic ointment Place 1 application into both eyes 2 (two) times daily.    . traMADol (ULTRAM) 50 MG tablet Take 50 mg by mouth daily as needed for moderate pain.    Marland Kitchen triazolam (HALCION) 0.25 MG tablet Take 0.125 mg by mouth at bedtime.     . trolamine salicylate (ASPERCREME) 10 % cream Apply 1 application topically as needed for muscle pain.     No current facility-administered medications for this visit.  Past Surgical History:  Procedure Laterality Date  . allergy shots  weekly  . CHOLECYSTECTOMY    . COLONOSCOPY     Dr.Rehman  . COLONOSCOPY N/A 01/27/2015   Procedure: COLONOSCOPY;  Surgeon: Rogene Houston, MD;  Location: [facility];  Service: Endoscopy;  Laterality: N/A;  930  . GALLBLADDER SURGERY  12/1993  . UPPER GASTROINTESTINAL ENDOSCOPY       Allergies  Allergen Reactions  . Lodine [Etodolac] Swelling and Rash      Family History  Problem Relation Age of Onset  . Heart disease Mother   . Pancreatic cancer Mother   . Prostate cancer Father   . Healthy Sister   . Parkinson's disease Brother   . Asthma Sister   . Healthy Sister      Social History Mr. Paisley reports that he has never  smoked. He has never used smokeless tobacco. Mr. Geisen reports that he drinks alcohol.   Review of Systems CONSTITUTIONAL: No weight loss, fever, chills, weakness or fatigue.  HEENT: Eyes: No visual loss, blurred vision, double vision or yellow sclerae.No hearing loss, sneezing, congestion, runny nose or sore throat.  SKIN: No rash or itching.  CARDIOVASCULAR: per hpi RESPIRATORY: no SOB, no cough or wheezing.  GASTROINTESTINAL: No anorexia, nausea, vomiting or diarrhea. No abdominal pain or blood.  GENITOURINARY: No burning on urination, no polyuria NEUROLOGICAL: No headache, dizziness, syncope, paralysis, ataxia, numbness or tingling in the extremities. No change in bowel or bladder control.  MUSCULOSKELETAL: No muscle, back pain, joint pain or stiffness.  LYMPHATICS: No enlarged nodes. No history of splenectomy.  PSYCHIATRIC: No history of depression or anxiety.  ENDOCRINOLOGIC: No reports of sweating, cold or heat intolerance. No polyuria or polydipsia.  Marland Kitchen   Physical Examination Vitals:   03/18/17 1413  BP: (!) 148/90  Pulse: 62  SpO2: 98%   Vitals:   03/18/17 1413  Weight: 196 lb 3.2 oz (89 kg)  Height: 5\' 11"  (1.803 m)    Gen: resting comfortably, no acute distress HEENT: no scleral icterus, pupils equal round and reactive, no palptable cervical adenopathy,  CV: RRR, no m/r/g, no jvd Resp: Clear to auscultation bilaterally GI: abdomen is soft, non-tender, non-distended, normal bowel sounds, no hepatosplenomegaly MSK: extremities are warm, no edema.  Skin: warm, no rash Neuro:  no focal deficits Psych: appropriate affect   Diagnostic Studies 12/2016 echo Study Conclusions  - Left ventricle: The cavity size was normal. Systolic function was   normal. The estimated ejection fraction was in the range of 60%   to 65%. There was an increased relative contribution of atrial   contraction to ventricular filling. Mild focal basal septal   hypertrophy. - Aorta: Very  mild ascending aortic dilatation. Maximal diameter   3.8 cm. - Mitral valve: There was trivial regurgitation. - Tricuspid valve: There was mild regurgitation. - Pulmonary arteries: PA peak pressure: 36 mm Hg (S). - Systemic veins: IVC is dilated with normal respiratory variation.   Estimated RAP 8 mmHg.    Assessment and Plan   1. Palpitations/Chest pain - no recent symptoms, seemed to have improved with more aggressive treatment of his severe allergies - continue to monitor.   2. HTN - elevated in clinic, he will submit a bp log to his pcp later this week at there appointment. May have a component of white coat HTN   F/u 6 months     Arnoldo Lenis, M.D.

## 2017-03-20 DIAGNOSIS — I10 Essential (primary) hypertension: Secondary | ICD-10-CM | POA: Diagnosis not present

## 2017-03-20 DIAGNOSIS — Z0000 Encounter for general adult medical examination without abnormal findings: Secondary | ICD-10-CM | POA: Diagnosis not present

## 2017-03-20 DIAGNOSIS — Z23 Encounter for immunization: Secondary | ICD-10-CM | POA: Diagnosis not present

## 2017-03-20 DIAGNOSIS — E663 Overweight: Secondary | ICD-10-CM | POA: Diagnosis not present

## 2017-03-20 DIAGNOSIS — M0689 Other specified rheumatoid arthritis, multiple sites: Secondary | ICD-10-CM | POA: Diagnosis not present

## 2017-03-20 DIAGNOSIS — Z1389 Encounter for screening for other disorder: Secondary | ICD-10-CM | POA: Diagnosis not present

## 2017-03-20 DIAGNOSIS — Z6826 Body mass index (BMI) 26.0-26.9, adult: Secondary | ICD-10-CM | POA: Diagnosis not present

## 2017-03-21 DIAGNOSIS — Z1389 Encounter for screening for other disorder: Secondary | ICD-10-CM | POA: Diagnosis not present

## 2017-03-21 DIAGNOSIS — G64 Other disorders of peripheral nervous system: Secondary | ICD-10-CM | POA: Diagnosis not present

## 2017-03-21 DIAGNOSIS — Z23 Encounter for immunization: Secondary | ICD-10-CM | POA: Diagnosis not present

## 2017-03-21 DIAGNOSIS — Z6826 Body mass index (BMI) 26.0-26.9, adult: Secondary | ICD-10-CM | POA: Diagnosis not present

## 2017-03-27 DIAGNOSIS — J3081 Allergic rhinitis due to animal (cat) (dog) hair and dander: Secondary | ICD-10-CM | POA: Diagnosis not present

## 2017-03-27 DIAGNOSIS — J3089 Other allergic rhinitis: Secondary | ICD-10-CM | POA: Diagnosis not present

## 2017-03-27 DIAGNOSIS — J301 Allergic rhinitis due to pollen: Secondary | ICD-10-CM | POA: Diagnosis not present

## 2017-04-01 DIAGNOSIS — J301 Allergic rhinitis due to pollen: Secondary | ICD-10-CM | POA: Diagnosis not present

## 2017-04-01 DIAGNOSIS — J3081 Allergic rhinitis due to animal (cat) (dog) hair and dander: Secondary | ICD-10-CM | POA: Diagnosis not present

## 2017-04-01 DIAGNOSIS — J3089 Other allergic rhinitis: Secondary | ICD-10-CM | POA: Diagnosis not present

## 2017-04-08 DIAGNOSIS — J3081 Allergic rhinitis due to animal (cat) (dog) hair and dander: Secondary | ICD-10-CM | POA: Diagnosis not present

## 2017-04-08 DIAGNOSIS — J3089 Other allergic rhinitis: Secondary | ICD-10-CM | POA: Diagnosis not present

## 2017-04-08 DIAGNOSIS — J301 Allergic rhinitis due to pollen: Secondary | ICD-10-CM | POA: Diagnosis not present

## 2017-04-10 DIAGNOSIS — H1851 Endothelial corneal dystrophy: Secondary | ICD-10-CM | POA: Diagnosis not present

## 2017-04-10 DIAGNOSIS — H25812 Combined forms of age-related cataract, left eye: Secondary | ICD-10-CM | POA: Diagnosis not present

## 2017-04-10 DIAGNOSIS — Z9889 Other specified postprocedural states: Secondary | ICD-10-CM | POA: Diagnosis not present

## 2017-04-15 DIAGNOSIS — J3081 Allergic rhinitis due to animal (cat) (dog) hair and dander: Secondary | ICD-10-CM | POA: Diagnosis not present

## 2017-04-15 DIAGNOSIS — J3089 Other allergic rhinitis: Secondary | ICD-10-CM | POA: Diagnosis not present

## 2017-04-15 DIAGNOSIS — J301 Allergic rhinitis due to pollen: Secondary | ICD-10-CM | POA: Diagnosis not present

## 2017-04-17 DIAGNOSIS — J3089 Other allergic rhinitis: Secondary | ICD-10-CM | POA: Diagnosis not present

## 2017-04-17 DIAGNOSIS — J3081 Allergic rhinitis due to animal (cat) (dog) hair and dander: Secondary | ICD-10-CM | POA: Diagnosis not present

## 2017-04-17 DIAGNOSIS — J301 Allergic rhinitis due to pollen: Secondary | ICD-10-CM | POA: Diagnosis not present

## 2017-04-24 DIAGNOSIS — J3089 Other allergic rhinitis: Secondary | ICD-10-CM | POA: Diagnosis not present

## 2017-04-24 DIAGNOSIS — J3081 Allergic rhinitis due to animal (cat) (dog) hair and dander: Secondary | ICD-10-CM | POA: Diagnosis not present

## 2017-04-24 DIAGNOSIS — J301 Allergic rhinitis due to pollen: Secondary | ICD-10-CM | POA: Diagnosis not present

## 2017-04-29 DIAGNOSIS — L309 Dermatitis, unspecified: Secondary | ICD-10-CM | POA: Diagnosis not present

## 2017-04-29 DIAGNOSIS — J3081 Allergic rhinitis due to animal (cat) (dog) hair and dander: Secondary | ICD-10-CM | POA: Diagnosis not present

## 2017-04-29 DIAGNOSIS — L570 Actinic keratosis: Secondary | ICD-10-CM | POA: Diagnosis not present

## 2017-04-29 DIAGNOSIS — J301 Allergic rhinitis due to pollen: Secondary | ICD-10-CM | POA: Diagnosis not present

## 2017-04-29 DIAGNOSIS — J3089 Other allergic rhinitis: Secondary | ICD-10-CM | POA: Diagnosis not present

## 2017-05-06 DIAGNOSIS — J3081 Allergic rhinitis due to animal (cat) (dog) hair and dander: Secondary | ICD-10-CM | POA: Diagnosis not present

## 2017-05-06 DIAGNOSIS — J301 Allergic rhinitis due to pollen: Secondary | ICD-10-CM | POA: Diagnosis not present

## 2017-05-06 DIAGNOSIS — J3089 Other allergic rhinitis: Secondary | ICD-10-CM | POA: Diagnosis not present

## 2017-05-13 DIAGNOSIS — J301 Allergic rhinitis due to pollen: Secondary | ICD-10-CM | POA: Diagnosis not present

## 2017-05-13 DIAGNOSIS — J3081 Allergic rhinitis due to animal (cat) (dog) hair and dander: Secondary | ICD-10-CM | POA: Diagnosis not present

## 2017-05-13 DIAGNOSIS — J3089 Other allergic rhinitis: Secondary | ICD-10-CM | POA: Diagnosis not present

## 2017-05-14 DIAGNOSIS — I10 Essential (primary) hypertension: Secondary | ICD-10-CM | POA: Diagnosis not present

## 2017-05-14 DIAGNOSIS — Z6827 Body mass index (BMI) 27.0-27.9, adult: Secondary | ICD-10-CM | POA: Diagnosis not present

## 2017-05-14 DIAGNOSIS — E663 Overweight: Secondary | ICD-10-CM | POA: Diagnosis not present

## 2017-05-14 DIAGNOSIS — R400 Somnolence: Secondary | ICD-10-CM | POA: Diagnosis not present

## 2017-05-14 DIAGNOSIS — R5383 Other fatigue: Secondary | ICD-10-CM | POA: Diagnosis not present

## 2017-05-14 DIAGNOSIS — K219 Gastro-esophageal reflux disease without esophagitis: Secondary | ICD-10-CM | POA: Diagnosis not present

## 2017-05-14 DIAGNOSIS — E291 Testicular hypofunction: Secondary | ICD-10-CM | POA: Diagnosis not present

## 2017-05-20 DIAGNOSIS — J3081 Allergic rhinitis due to animal (cat) (dog) hair and dander: Secondary | ICD-10-CM | POA: Diagnosis not present

## 2017-05-20 DIAGNOSIS — J301 Allergic rhinitis due to pollen: Secondary | ICD-10-CM | POA: Diagnosis not present

## 2017-05-20 DIAGNOSIS — J3089 Other allergic rhinitis: Secondary | ICD-10-CM | POA: Diagnosis not present

## 2017-05-29 DIAGNOSIS — H25812 Combined forms of age-related cataract, left eye: Secondary | ICD-10-CM | POA: Diagnosis not present

## 2017-05-29 DIAGNOSIS — H02401 Unspecified ptosis of right eyelid: Secondary | ICD-10-CM | POA: Diagnosis not present

## 2017-05-29 DIAGNOSIS — J3089 Other allergic rhinitis: Secondary | ICD-10-CM | POA: Diagnosis not present

## 2017-05-29 DIAGNOSIS — I10 Essential (primary) hypertension: Secondary | ICD-10-CM | POA: Diagnosis not present

## 2017-05-29 DIAGNOSIS — H02833 Dermatochalasis of right eye, unspecified eyelid: Secondary | ICD-10-CM | POA: Diagnosis not present

## 2017-05-29 DIAGNOSIS — H1851 Endothelial corneal dystrophy: Secondary | ICD-10-CM | POA: Diagnosis not present

## 2017-05-29 DIAGNOSIS — Z9841 Cataract extraction status, right eye: Secondary | ICD-10-CM | POA: Diagnosis not present

## 2017-05-29 DIAGNOSIS — Z886 Allergy status to analgesic agent status: Secondary | ICD-10-CM | POA: Diagnosis not present

## 2017-05-29 DIAGNOSIS — Z48810 Encounter for surgical aftercare following surgery on the sense organs: Secondary | ICD-10-CM | POA: Diagnosis not present

## 2017-05-29 DIAGNOSIS — J3081 Allergic rhinitis due to animal (cat) (dog) hair and dander: Secondary | ICD-10-CM | POA: Diagnosis not present

## 2017-05-29 DIAGNOSIS — J301 Allergic rhinitis due to pollen: Secondary | ICD-10-CM | POA: Diagnosis not present

## 2017-05-29 DIAGNOSIS — Z9889 Other specified postprocedural states: Secondary | ICD-10-CM | POA: Diagnosis not present

## 2017-05-30 ENCOUNTER — Encounter (INDEPENDENT_AMBULATORY_CARE_PROVIDER_SITE_OTHER): Payer: Self-pay

## 2017-05-30 ENCOUNTER — Encounter (INDEPENDENT_AMBULATORY_CARE_PROVIDER_SITE_OTHER): Payer: Self-pay | Admitting: Internal Medicine

## 2017-05-30 ENCOUNTER — Ambulatory Visit (INDEPENDENT_AMBULATORY_CARE_PROVIDER_SITE_OTHER): Payer: Medicare HMO | Admitting: Internal Medicine

## 2017-05-30 VITALS — BP 128/80 | HR 64 | Temp 97.0°F | Ht 71.0 in | Wt 202.9 lb

## 2017-05-30 DIAGNOSIS — Z8719 Personal history of other diseases of the digestive system: Secondary | ICD-10-CM | POA: Diagnosis not present

## 2017-05-30 LAB — CBC
HCT: 39.8 % (ref 38.5–50.0)
Hemoglobin: 13.7 g/dL (ref 13.2–17.1)
MCH: 29.5 pg (ref 27.0–33.0)
MCHC: 34.4 g/dL (ref 32.0–36.0)
MCV: 85.8 fL (ref 80.0–100.0)
MPV: 9.4 fL (ref 7.5–12.5)
Platelets: 233 10*3/uL (ref 140–400)
RBC: 4.64 10*6/uL (ref 4.20–5.80)
RDW: 13.8 % (ref 11.0–15.0)
WBC: 7.2 10*3/uL (ref 3.8–10.8)

## 2017-05-30 NOTE — Progress Notes (Signed)
Subjective:    Patient ID: Cole Brown, male    DOB: 1945-10-16, 71 y.o.   MRN: [id number]  HPI Here today for f/u. Last seen in June of this year for f/u after admission to [facility] for probable diverticular bleed. Admitted x 1 days Hemoglobin remained stable while in hospital.  He is doing good. No melena or BRRB. He is having a BM usually x 2 a day.  Appetite is good. No weight loss. He has actually gained about 5 pounds. Reck at [place] revealed hemoglobin of 12.6. He was doing well at [place]. No Rectal bleeding.  His last colonoscopy was in August  of 2016 which revealed: Multiple diverticula at sigmoid colon. External hemorrhoids. No evidence of colonic polyps.     Review of Systems Past Medical History:  Diagnosis Date  . Diverticula of colon    2016 colonoscopy  . Fibromyalgia   . Fibromyalgia   . History of GI diverticular bleed   . Hypercholesteremia   . Hypertension     Past Surgical History:  Procedure Laterality Date  . allergy shots  weekly  . CHOLECYSTECTOMY    . COLONOSCOPY     Dr.Rehman  . COLONOSCOPY N/A 01/27/2015   Procedure: COLONOSCOPY;  Surgeon: Rogene Houston, MD;  Location: [facility];  Service: Endoscopy;  Laterality: N/A;  930  . GALLBLADDER SURGERY  12/1993  . UPPER GASTROINTESTINAL ENDOSCOPY      Allergies  Allergen Reactions  . Lodine [Etodolac] Swelling and Rash    Current Outpatient Medications on File Prior to Visit  Medication Sig Dispense Refill  . acetaminophen (TYLENOL) 650 MG CR tablet Take 650 mg by mouth 4 (four) times daily.    Marland Kitchen atenolol (TENORMIN) 50 MG tablet Take 25 mg by mouth every evening.     Marland Kitchen azelastine (OPTIVAR) 0.05 % ophthalmic solution Place 1 drop into both eyes 2 (two) times daily.    . Azelastine-Fluticasone (DYMISTA) 137-50 MCG/ACT SUSP Place into the nose 2 (two) times daily.     . cholecalciferol (VITAMIN D) 1000 units tablet Take 1,000 Units by mouth daily.    . diphenhydrAMINE (BENADRYL) 25 MG tablet Take 25  mg by mouth as needed for allergies.     . Ferrous Sulfate (SLOW FE PO) Take 1 tablet by mouth daily.    . fluticasone (FLONASE ALLERGY RELIEF) 50 MCG/ACT nasal spray Place 2 sprays into both nostrils daily as needed for allergies or rhinitis.     . Homeopathic Products (ARNICA) GEL Apply 1 application topically daily as needed (for pain). Arnica Motanna GEL     . levocetirizine (XYZAL) 5 MG tablet Take 5 mg by mouth every evening.     Marland Kitchen losartan (COZAAR) 50 MG tablet Take 100 mg by mouth every morning.     . modafinil (PROVIGIL) 200 MG tablet Take 100-200 mg by mouth every morning.    . Omega-3 Fatty Acids (FISH OIL) 1000 MG CAPS Take 1 capsule by mouth 2 (two) times daily.    . ranitidine (ZANTAC) 75 MG tablet Take 75 mg by mouth daily as needed for heartburn.     . sodium chloride (MURO 128) 5 % ophthalmic ointment Place 1 application into both eyes 2 (two) times daily.    . traMADol (ULTRAM) 50 MG tablet Take 50 mg by mouth daily as needed for moderate pain.    Marland Kitchen triazolam (HALCION) 0.25 MG tablet Take 0.125 mg by mouth at bedtime.     . trolamine  salicylate (ASPERCREME) 10 % cream Apply 1 application topically as needed for muscle pain.     No current facility-administered medications on file prior to visit.         Objective:   Physical Exam Blood pressure 128/80, pulse 64, temperature (!) 97 F (36.1 C), height 5\' 11"  (1.803 m), weight 202 lb 14.4 oz (92 kg). Alert and oriented. Skin warm and dry. Oral mucosa is moist.   . Sclera anicteric, conjunctivae is pink. Thyroid not enlarged. No cervical lymphadenopathy. Lungs clear. Heart regular rate and rhythm.  Abdomen is soft. Bowel sounds are positive. No hepatomegaly. No abdominal masses felt. No tenderness.  No edema to lower extremities.           Assessment & Plan:  Diverticular bleed resolved. Will get a CBC today.  Colonoscopy UTD.  OV in 1 year.

## 2017-05-30 NOTE — Patient Instructions (Signed)
CBC today. OV in 1 year. 

## 2017-06-04 DIAGNOSIS — J3081 Allergic rhinitis due to animal (cat) (dog) hair and dander: Secondary | ICD-10-CM | POA: Diagnosis not present

## 2017-06-04 DIAGNOSIS — J301 Allergic rhinitis due to pollen: Secondary | ICD-10-CM | POA: Diagnosis not present

## 2017-06-04 DIAGNOSIS — J3089 Other allergic rhinitis: Secondary | ICD-10-CM | POA: Diagnosis not present

## 2017-06-07 DIAGNOSIS — R5383 Other fatigue: Secondary | ICD-10-CM | POA: Diagnosis not present

## 2017-06-07 DIAGNOSIS — Z719 Counseling, unspecified: Secondary | ICD-10-CM | POA: Diagnosis not present

## 2017-06-07 DIAGNOSIS — R400 Somnolence: Secondary | ICD-10-CM | POA: Diagnosis not present

## 2017-06-10 DIAGNOSIS — G4730 Sleep apnea, unspecified: Secondary | ICD-10-CM | POA: Diagnosis not present

## 2017-06-14 DIAGNOSIS — J3081 Allergic rhinitis due to animal (cat) (dog) hair and dander: Secondary | ICD-10-CM | POA: Diagnosis not present

## 2017-06-14 DIAGNOSIS — J301 Allergic rhinitis due to pollen: Secondary | ICD-10-CM | POA: Diagnosis not present

## 2017-06-14 DIAGNOSIS — J3089 Other allergic rhinitis: Secondary | ICD-10-CM | POA: Diagnosis not present

## 2017-06-17 DIAGNOSIS — J301 Allergic rhinitis due to pollen: Secondary | ICD-10-CM | POA: Diagnosis not present

## 2017-06-17 DIAGNOSIS — J3089 Other allergic rhinitis: Secondary | ICD-10-CM | POA: Diagnosis not present

## 2017-06-26 DIAGNOSIS — J301 Allergic rhinitis due to pollen: Secondary | ICD-10-CM | POA: Diagnosis not present

## 2017-06-26 DIAGNOSIS — J3081 Allergic rhinitis due to animal (cat) (dog) hair and dander: Secondary | ICD-10-CM | POA: Diagnosis not present

## 2017-06-26 DIAGNOSIS — J3089 Other allergic rhinitis: Secondary | ICD-10-CM | POA: Diagnosis not present

## 2017-07-05 DIAGNOSIS — J301 Allergic rhinitis due to pollen: Secondary | ICD-10-CM | POA: Diagnosis not present

## 2017-07-05 DIAGNOSIS — J3089 Other allergic rhinitis: Secondary | ICD-10-CM | POA: Diagnosis not present

## 2017-07-05 DIAGNOSIS — J3081 Allergic rhinitis due to animal (cat) (dog) hair and dander: Secondary | ICD-10-CM | POA: Diagnosis not present

## 2017-07-10 DIAGNOSIS — Z79899 Other long term (current) drug therapy: Secondary | ICD-10-CM | POA: Diagnosis not present

## 2017-07-10 DIAGNOSIS — Z9889 Other specified postprocedural states: Secondary | ICD-10-CM | POA: Diagnosis not present

## 2017-07-10 DIAGNOSIS — H52203 Unspecified astigmatism, bilateral: Secondary | ICD-10-CM | POA: Diagnosis not present

## 2017-07-10 DIAGNOSIS — H5211 Myopia, right eye: Secondary | ICD-10-CM | POA: Diagnosis not present

## 2017-07-10 DIAGNOSIS — H5202 Hypermetropia, left eye: Secondary | ICD-10-CM | POA: Diagnosis not present

## 2017-07-10 DIAGNOSIS — Z0100 Encounter for examination of eyes and vision without abnormal findings: Secondary | ICD-10-CM | POA: Diagnosis not present

## 2017-07-10 DIAGNOSIS — H524 Presbyopia: Secondary | ICD-10-CM | POA: Diagnosis not present

## 2017-07-10 DIAGNOSIS — H25812 Combined forms of age-related cataract, left eye: Secondary | ICD-10-CM | POA: Diagnosis not present

## 2017-07-10 DIAGNOSIS — H1851 Endothelial corneal dystrophy: Secondary | ICD-10-CM | POA: Diagnosis not present

## 2017-07-12 DIAGNOSIS — J301 Allergic rhinitis due to pollen: Secondary | ICD-10-CM | POA: Diagnosis not present

## 2017-07-12 DIAGNOSIS — J3081 Allergic rhinitis due to animal (cat) (dog) hair and dander: Secondary | ICD-10-CM | POA: Diagnosis not present

## 2017-07-12 DIAGNOSIS — J3089 Other allergic rhinitis: Secondary | ICD-10-CM | POA: Diagnosis not present

## 2017-07-15 DIAGNOSIS — G4733 Obstructive sleep apnea (adult) (pediatric): Secondary | ICD-10-CM | POA: Diagnosis not present

## 2017-07-17 DIAGNOSIS — J301 Allergic rhinitis due to pollen: Secondary | ICD-10-CM | POA: Diagnosis not present

## 2017-07-17 DIAGNOSIS — J3089 Other allergic rhinitis: Secondary | ICD-10-CM | POA: Diagnosis not present

## 2017-07-17 DIAGNOSIS — J3081 Allergic rhinitis due to animal (cat) (dog) hair and dander: Secondary | ICD-10-CM | POA: Diagnosis not present

## 2017-07-22 DIAGNOSIS — J3081 Allergic rhinitis due to animal (cat) (dog) hair and dander: Secondary | ICD-10-CM | POA: Diagnosis not present

## 2017-07-22 DIAGNOSIS — J301 Allergic rhinitis due to pollen: Secondary | ICD-10-CM | POA: Diagnosis not present

## 2017-07-22 DIAGNOSIS — J3089 Other allergic rhinitis: Secondary | ICD-10-CM | POA: Diagnosis not present

## 2017-07-26 DIAGNOSIS — J3081 Allergic rhinitis due to animal (cat) (dog) hair and dander: Secondary | ICD-10-CM | POA: Diagnosis not present

## 2017-07-26 DIAGNOSIS — J3089 Other allergic rhinitis: Secondary | ICD-10-CM | POA: Diagnosis not present

## 2017-07-26 DIAGNOSIS — J301 Allergic rhinitis due to pollen: Secondary | ICD-10-CM | POA: Diagnosis not present

## 2017-07-29 DIAGNOSIS — J3089 Other allergic rhinitis: Secondary | ICD-10-CM | POA: Diagnosis not present

## 2017-07-29 DIAGNOSIS — J3081 Allergic rhinitis due to animal (cat) (dog) hair and dander: Secondary | ICD-10-CM | POA: Diagnosis not present

## 2017-07-29 DIAGNOSIS — J301 Allergic rhinitis due to pollen: Secondary | ICD-10-CM | POA: Diagnosis not present

## 2017-08-05 DIAGNOSIS — J301 Allergic rhinitis due to pollen: Secondary | ICD-10-CM | POA: Diagnosis not present

## 2017-08-05 DIAGNOSIS — J3089 Other allergic rhinitis: Secondary | ICD-10-CM | POA: Diagnosis not present

## 2017-08-05 DIAGNOSIS — J3081 Allergic rhinitis due to animal (cat) (dog) hair and dander: Secondary | ICD-10-CM | POA: Diagnosis not present

## 2017-08-12 DIAGNOSIS — J3081 Allergic rhinitis due to animal (cat) (dog) hair and dander: Secondary | ICD-10-CM | POA: Diagnosis not present

## 2017-08-12 DIAGNOSIS — J3089 Other allergic rhinitis: Secondary | ICD-10-CM | POA: Diagnosis not present

## 2017-08-12 DIAGNOSIS — J301 Allergic rhinitis due to pollen: Secondary | ICD-10-CM | POA: Diagnosis not present

## 2017-08-15 DIAGNOSIS — G4733 Obstructive sleep apnea (adult) (pediatric): Secondary | ICD-10-CM | POA: Diagnosis not present

## 2017-08-26 DIAGNOSIS — J3081 Allergic rhinitis due to animal (cat) (dog) hair and dander: Secondary | ICD-10-CM | POA: Diagnosis not present

## 2017-08-26 DIAGNOSIS — J301 Allergic rhinitis due to pollen: Secondary | ICD-10-CM | POA: Diagnosis not present

## 2017-08-26 DIAGNOSIS — J3089 Other allergic rhinitis: Secondary | ICD-10-CM | POA: Diagnosis not present

## 2017-09-02 DIAGNOSIS — J301 Allergic rhinitis due to pollen: Secondary | ICD-10-CM | POA: Diagnosis not present

## 2017-09-02 DIAGNOSIS — J3081 Allergic rhinitis due to animal (cat) (dog) hair and dander: Secondary | ICD-10-CM | POA: Diagnosis not present

## 2017-09-02 DIAGNOSIS — J3089 Other allergic rhinitis: Secondary | ICD-10-CM | POA: Diagnosis not present

## 2017-09-06 DIAGNOSIS — J3081 Allergic rhinitis due to animal (cat) (dog) hair and dander: Secondary | ICD-10-CM | POA: Diagnosis not present

## 2017-09-06 DIAGNOSIS — J301 Allergic rhinitis due to pollen: Secondary | ICD-10-CM | POA: Diagnosis not present

## 2017-09-06 DIAGNOSIS — J3089 Other allergic rhinitis: Secondary | ICD-10-CM | POA: Diagnosis not present

## 2017-09-09 DIAGNOSIS — J301 Allergic rhinitis due to pollen: Secondary | ICD-10-CM | POA: Diagnosis not present

## 2017-09-09 DIAGNOSIS — J3081 Allergic rhinitis due to animal (cat) (dog) hair and dander: Secondary | ICD-10-CM | POA: Diagnosis not present

## 2017-09-09 DIAGNOSIS — J3089 Other allergic rhinitis: Secondary | ICD-10-CM | POA: Diagnosis not present

## 2017-09-11 DIAGNOSIS — H02401 Unspecified ptosis of right eyelid: Secondary | ICD-10-CM | POA: Diagnosis not present

## 2017-09-11 DIAGNOSIS — Z888 Allergy status to other drugs, medicaments and biological substances status: Secondary | ICD-10-CM | POA: Diagnosis not present

## 2017-09-11 DIAGNOSIS — Z9889 Other specified postprocedural states: Secondary | ICD-10-CM | POA: Diagnosis not present

## 2017-09-11 DIAGNOSIS — H02833 Dermatochalasis of right eye, unspecified eyelid: Secondary | ICD-10-CM | POA: Diagnosis not present

## 2017-09-11 DIAGNOSIS — H25812 Combined forms of age-related cataract, left eye: Secondary | ICD-10-CM | POA: Diagnosis not present

## 2017-09-11 DIAGNOSIS — H1851 Endothelial corneal dystrophy: Secondary | ICD-10-CM | POA: Diagnosis not present

## 2017-09-11 DIAGNOSIS — Z886 Allergy status to analgesic agent status: Secondary | ICD-10-CM | POA: Diagnosis not present

## 2017-09-11 DIAGNOSIS — Z961 Presence of intraocular lens: Secondary | ICD-10-CM | POA: Diagnosis not present

## 2017-09-12 DIAGNOSIS — G4733 Obstructive sleep apnea (adult) (pediatric): Secondary | ICD-10-CM | POA: Diagnosis not present

## 2017-09-13 DIAGNOSIS — J301 Allergic rhinitis due to pollen: Secondary | ICD-10-CM | POA: Diagnosis not present

## 2017-09-13 DIAGNOSIS — J3089 Other allergic rhinitis: Secondary | ICD-10-CM | POA: Diagnosis not present

## 2017-09-13 DIAGNOSIS — J3081 Allergic rhinitis due to animal (cat) (dog) hair and dander: Secondary | ICD-10-CM | POA: Diagnosis not present

## 2017-09-16 DIAGNOSIS — J301 Allergic rhinitis due to pollen: Secondary | ICD-10-CM | POA: Diagnosis not present

## 2017-09-16 DIAGNOSIS — J3089 Other allergic rhinitis: Secondary | ICD-10-CM | POA: Diagnosis not present

## 2017-09-16 DIAGNOSIS — J3081 Allergic rhinitis due to animal (cat) (dog) hair and dander: Secondary | ICD-10-CM | POA: Diagnosis not present

## 2017-09-23 DIAGNOSIS — J3089 Other allergic rhinitis: Secondary | ICD-10-CM | POA: Diagnosis not present

## 2017-09-23 DIAGNOSIS — J3081 Allergic rhinitis due to animal (cat) (dog) hair and dander: Secondary | ICD-10-CM | POA: Diagnosis not present

## 2017-09-23 DIAGNOSIS — J301 Allergic rhinitis due to pollen: Secondary | ICD-10-CM | POA: Diagnosis not present

## 2017-09-25 DIAGNOSIS — I10 Essential (primary) hypertension: Secondary | ICD-10-CM | POA: Diagnosis not present

## 2017-09-25 DIAGNOSIS — Z6828 Body mass index (BMI) 28.0-28.9, adult: Secondary | ICD-10-CM | POA: Diagnosis not present

## 2017-09-25 DIAGNOSIS — E291 Testicular hypofunction: Secondary | ICD-10-CM | POA: Diagnosis not present

## 2017-09-25 DIAGNOSIS — G4733 Obstructive sleep apnea (adult) (pediatric): Secondary | ICD-10-CM | POA: Diagnosis not present

## 2017-09-25 DIAGNOSIS — E663 Overweight: Secondary | ICD-10-CM | POA: Diagnosis not present

## 2017-09-30 DIAGNOSIS — J3081 Allergic rhinitis due to animal (cat) (dog) hair and dander: Secondary | ICD-10-CM | POA: Diagnosis not present

## 2017-09-30 DIAGNOSIS — J3089 Other allergic rhinitis: Secondary | ICD-10-CM | POA: Diagnosis not present

## 2017-09-30 DIAGNOSIS — J301 Allergic rhinitis due to pollen: Secondary | ICD-10-CM | POA: Diagnosis not present

## 2017-10-07 DIAGNOSIS — G4733 Obstructive sleep apnea (adult) (pediatric): Secondary | ICD-10-CM | POA: Diagnosis not present

## 2017-10-11 DIAGNOSIS — J3081 Allergic rhinitis due to animal (cat) (dog) hair and dander: Secondary | ICD-10-CM | POA: Diagnosis not present

## 2017-10-11 DIAGNOSIS — J301 Allergic rhinitis due to pollen: Secondary | ICD-10-CM | POA: Diagnosis not present

## 2017-10-11 DIAGNOSIS — J3089 Other allergic rhinitis: Secondary | ICD-10-CM | POA: Diagnosis not present

## 2017-10-13 DIAGNOSIS — G4733 Obstructive sleep apnea (adult) (pediatric): Secondary | ICD-10-CM | POA: Diagnosis not present

## 2017-10-14 DIAGNOSIS — J3089 Other allergic rhinitis: Secondary | ICD-10-CM | POA: Diagnosis not present

## 2017-10-14 DIAGNOSIS — J3081 Allergic rhinitis due to animal (cat) (dog) hair and dander: Secondary | ICD-10-CM | POA: Diagnosis not present

## 2017-10-14 DIAGNOSIS — G4733 Obstructive sleep apnea (adult) (pediatric): Secondary | ICD-10-CM | POA: Diagnosis not present

## 2017-10-14 DIAGNOSIS — J301 Allergic rhinitis due to pollen: Secondary | ICD-10-CM | POA: Diagnosis not present

## 2017-10-30 DIAGNOSIS — J3081 Allergic rhinitis due to animal (cat) (dog) hair and dander: Secondary | ICD-10-CM | POA: Diagnosis not present

## 2017-10-30 DIAGNOSIS — J301 Allergic rhinitis due to pollen: Secondary | ICD-10-CM | POA: Diagnosis not present

## 2017-10-30 DIAGNOSIS — R05 Cough: Secondary | ICD-10-CM | POA: Diagnosis not present

## 2017-10-30 DIAGNOSIS — J3089 Other allergic rhinitis: Secondary | ICD-10-CM | POA: Diagnosis not present

## 2017-11-04 DIAGNOSIS — J3081 Allergic rhinitis due to animal (cat) (dog) hair and dander: Secondary | ICD-10-CM | POA: Diagnosis not present

## 2017-11-04 DIAGNOSIS — J3089 Other allergic rhinitis: Secondary | ICD-10-CM | POA: Diagnosis not present

## 2017-11-04 DIAGNOSIS — J301 Allergic rhinitis due to pollen: Secondary | ICD-10-CM | POA: Diagnosis not present

## 2017-11-12 ENCOUNTER — Other Ambulatory Visit: Payer: Self-pay

## 2017-11-12 ENCOUNTER — Ambulatory Visit: Payer: Medicare HMO | Admitting: Cardiology

## 2017-11-12 ENCOUNTER — Encounter: Payer: Self-pay | Admitting: Cardiology

## 2017-11-12 ENCOUNTER — Encounter: Payer: Self-pay | Admitting: *Deleted

## 2017-11-12 VITALS — BP 156/81 | HR 48 | Ht 71.0 in | Wt 204.0 lb

## 2017-11-12 DIAGNOSIS — I10 Essential (primary) hypertension: Secondary | ICD-10-CM

## 2017-11-12 DIAGNOSIS — R002 Palpitations: Secondary | ICD-10-CM

## 2017-11-12 DIAGNOSIS — R001 Bradycardia, unspecified: Secondary | ICD-10-CM | POA: Diagnosis not present

## 2017-11-12 DIAGNOSIS — G4733 Obstructive sleep apnea (adult) (pediatric): Secondary | ICD-10-CM | POA: Diagnosis not present

## 2017-11-12 DIAGNOSIS — R0789 Other chest pain: Secondary | ICD-10-CM | POA: Diagnosis not present

## 2017-11-12 MED ORDER — ATENOLOL 25 MG PO TABS: 25.0000 mg | ORAL_TABLET | Freq: Every day | ORAL | 1 refills | Status: DC

## 2017-11-12 NOTE — Patient Instructions (Signed)
Your physician recommends that you schedule a follow-up appointment in: [place] has recommended you make the following change in your medication:   DECREASE ATENOLOL 25 MG DAILY  NURSE VISIT IN 2 WEEKS FOR VITALS/EKG  Thank you for choosing [facility]!!

## 2017-11-12 NOTE — Progress Notes (Signed)
Clinical Summary Cole Brown is a 72 y.o.male seen today for follow up of the following medical problems.   1. Palpitations - prior symptoms seemed to be related to severe allergies, resolved with resolution of his allergy symptoms.  - no recent palpitations.   2. Chest pain - chest tightness episode about 3 weeks ago. Chest tightness midchest, felt heavy. Felt lightheaded, confused. No palpitations at the time. Tightness lasted about 20 minutes. Went away on its own.  - has had other episodes of tightness since that time.  - denies any DOE. Sedentary lifestyle. - history of fibromyalgia per his report.  CAD risk factors: HTN, mother MI at 1 12/2016 echo: LVEF 60-65%, abnormal diastoilc function,    - no recent chest pain.   3. Bradycardia - no recent symptoms - he is on atenolol 50mg  daily.    Past Medical History:  Diagnosis Date  . Diverticula of colon    2016 colonoscopy  . Fibromyalgia   . Fibromyalgia   . History of GI diverticular bleed   . Hypercholesteremia   . Hypertension      Allergies  Allergen Reactions  . Lodine [Etodolac] Swelling and Rash     Current Outpatient Medications  Medication Sig Dispense Refill  . acetaminophen (TYLENOL) 650 MG CR tablet Take 650 mg by mouth 4 (four) times daily.    Marland Kitchen atenolol (TENORMIN) 50 MG tablet Take 25 mg by mouth every evening.     Marland Kitchen azelastine (OPTIVAR) 0.05 % ophthalmic solution Place 1 drop into both eyes 2 (two) times daily.    . Azelastine-Fluticasone (DYMISTA) 137-50 MCG/ACT SUSP Place into the nose 2 (two) times daily.     . cholecalciferol (VITAMIN D) 1000 units tablet Take 1,000 Units by mouth daily.    . diphenhydrAMINE (BENADRYL) 25 MG tablet Take 25 mg by mouth as needed for allergies.     . Ferrous Sulfate (SLOW FE PO) Take 1 tablet by mouth daily.    . fluticasone (FLONASE ALLERGY RELIEF) 50 MCG/ACT nasal spray Place 2 sprays into both nostrils daily as needed for allergies or rhinitis.     .  Homeopathic Products (ARNICA) GEL Apply 1 application topically daily as needed (for pain). Arnica Motanna GEL     . levocetirizine (XYZAL) 5 MG tablet Take 5 mg by mouth every evening.     Marland Kitchen losartan (COZAAR) 50 MG tablet Take 100 mg by mouth every morning.     . modafinil (PROVIGIL) 200 MG tablet Take 100-200 mg by mouth every morning.    . Omega-3 Fatty Acids (FISH OIL) 1000 MG CAPS Take 1 capsule by mouth 2 (two) times daily.    . ranitidine (ZANTAC) 75 MG tablet Take 75 mg by mouth daily as needed for heartburn.     . sodium chloride (MURO 128) 5 % ophthalmic ointment Place 1 application into both eyes 2 (two) times daily.    . traMADol (ULTRAM) 50 MG tablet Take 50 mg by mouth daily as needed for moderate pain.    Marland Kitchen triazolam (HALCION) 0.25 MG tablet Take 0.125 mg by mouth at bedtime.     . trolamine salicylate (ASPERCREME) 10 % cream Apply 1 application topically as needed for muscle pain.     No current facility-administered medications for this visit.      Past Surgical History:  Procedure Laterality Date  . allergy shots  weekly  . CHOLECYSTECTOMY    . COLONOSCOPY     Dr.Rehman  .  COLONOSCOPY N/A 01/27/2015   Procedure: COLONOSCOPY;  Surgeon: Rogene Houston, MD;  Location: [facility];  Service: Endoscopy;  Laterality: N/A;  930  . GALLBLADDER SURGERY  12/1993  . UPPER GASTROINTESTINAL ENDOSCOPY       Allergies  Allergen Reactions  . Lodine [Etodolac] Swelling and Rash      Family History  Problem Relation Age of Onset  . Heart disease Mother   . Pancreatic cancer Mother   . Prostate cancer Father   . Healthy Sister   . Parkinson's disease Brother   . Asthma Sister   . Healthy Sister      Social History Cole Brown reports that he has never smoked. He has never used smokeless tobacco. Cole Brown reports that he drinks alcohol.   Review of Systems CONSTITUTIONAL: No weight loss, fever, chills, weakness or fatigue.  HEENT: Eyes: No visual loss, blurred  vision, double vision or yellow sclerae.No hearing loss, sneezing, congestion, runny nose or sore throat.  SKIN: No rash or itching.  CARDIOVASCULAR: per hpi RESPIRATORY: No shortness of breath, cough or sputum.  GASTROINTESTINAL: No anorexia, nausea, vomiting or diarrhea. No abdominal pain or blood.  GENITOURINARY: No burning on urination, no polyuria NEUROLOGICAL: No headache, dizziness, syncope, paralysis, ataxia, numbness or tingling in the extremities. No change in bowel or bladder control.  MUSCULOSKELETAL: No muscle, back pain, joint pain or stiffness.  LYMPHATICS: No enlarged nodes. No history of splenectomy.  PSYCHIATRIC: No history of depression or anxiety.  ENDOCRINOLOGIC: No reports of sweating, cold or heat intolerance. No polyuria or polydipsia.  Marland Kitchen   Physical Examination Vitals:   11/12/17 1124  BP: (!) 156/81  Pulse: (!) 48  SpO2: 99%   Vitals:   11/12/17 1124  Weight: 204 lb (92.5 kg)  Height: 5\' 11"  (1.803 m)    Gen: resting comfortably, no acute distress HEENT: no scleral icterus, pupils equal round and reactive, no palptable cervical adenopathy,  CV: regular, brady 50, no m/r/g no jvd Resp: Clear to auscultation bilaterally GI: abdomen is soft, non-tender, non-distended, normal bowel sounds, no hepatosplenomegaly MSK: extremities are warm, no edema.  Skin: warm, no rash Neuro:  no focal deficits Psych: appropriate affect   Diagnostic Studies  12/2016 echo Study Conclusions  - Left ventricle: The cavity size was normal. Systolic function was normal. The estimated ejection fraction was in the range of 60% to 65%. There was an increased relative contribution of atrial contraction to ventricular filling. Mild focal basal septal hypertrophy. - Aorta: Very mild ascending aortic dilatation. Maximal diameter 3.8 cm. - Mitral valve: There was trivial regurgitation. - Tricuspid valve: There was mild regurgitation. - Pulmonary arteries: PA peak  pressure: 36 mm Hg (S). - Systemic veins: IVC is dilated with normal respiratory variation. Estimated RAP 8 mmHg.    Assessment and Plan  1. Palpitations/Chest pain -prior symptoms seemed to correlate with severe allergies at the time, no recurrence - continue to monitor.   2. HTN - manual repeat bp 142/78. There has been some question of some white coat HTN in the past. BP at goal 05/2017 - continue to monitor at this time, nursing visit 2 weeks for bp check  3. Bradycardia - EKG today shows sinus brady 48 - no symptoms - he will lower atenolol to 25mg  daily, nursing visit 2 weeks for repeat ekg. May need to wean or even stop atenolol if bradycardia persists.    F/u 1 year. Nursing visit 2 weeks for bp check and EKG.  Arnoldo Lenis, M.D.,

## 2017-11-13 DIAGNOSIS — J3089 Other allergic rhinitis: Secondary | ICD-10-CM | POA: Diagnosis not present

## 2017-11-13 DIAGNOSIS — J301 Allergic rhinitis due to pollen: Secondary | ICD-10-CM | POA: Diagnosis not present

## 2017-11-13 DIAGNOSIS — J3081 Allergic rhinitis due to animal (cat) (dog) hair and dander: Secondary | ICD-10-CM | POA: Diagnosis not present

## 2017-11-20 DIAGNOSIS — J301 Allergic rhinitis due to pollen: Secondary | ICD-10-CM | POA: Diagnosis not present

## 2017-11-20 DIAGNOSIS — J3089 Other allergic rhinitis: Secondary | ICD-10-CM | POA: Diagnosis not present

## 2017-11-20 DIAGNOSIS — J3081 Allergic rhinitis due to animal (cat) (dog) hair and dander: Secondary | ICD-10-CM | POA: Diagnosis not present

## 2017-11-21 DIAGNOSIS — D485 Neoplasm of uncertain behavior of skin: Secondary | ICD-10-CM | POA: Diagnosis not present

## 2017-11-21 DIAGNOSIS — E663 Overweight: Secondary | ICD-10-CM | POA: Diagnosis not present

## 2017-11-21 DIAGNOSIS — Z6828 Body mass index (BMI) 28.0-28.9, adult: Secondary | ICD-10-CM | POA: Diagnosis not present

## 2017-11-25 DIAGNOSIS — J3089 Other allergic rhinitis: Secondary | ICD-10-CM | POA: Diagnosis not present

## 2017-11-25 DIAGNOSIS — J3081 Allergic rhinitis due to animal (cat) (dog) hair and dander: Secondary | ICD-10-CM | POA: Diagnosis not present

## 2017-11-25 DIAGNOSIS — J301 Allergic rhinitis due to pollen: Secondary | ICD-10-CM | POA: Diagnosis not present

## 2017-11-26 ENCOUNTER — Ambulatory Visit (INDEPENDENT_AMBULATORY_CARE_PROVIDER_SITE_OTHER): Payer: Medicare HMO | Admitting: *Deleted

## 2017-11-26 DIAGNOSIS — I10 Essential (primary) hypertension: Secondary | ICD-10-CM

## 2017-11-26 DIAGNOSIS — R002 Palpitations: Secondary | ICD-10-CM | POA: Diagnosis not present

## 2017-11-26 NOTE — Progress Notes (Signed)
Heart rates improved, no further changes   Zandra Abts MD

## 2017-11-26 NOTE — Progress Notes (Signed)
Pt here for EKG/vitals per LOV change in Atenolol 25 mg daily - pt says he has been compliant - denies any symptoms today - EKG done - BP today is 132/92

## 2017-11-26 NOTE — Progress Notes (Signed)
Pt aware.

## 2017-12-02 DIAGNOSIS — J3089 Other allergic rhinitis: Secondary | ICD-10-CM | POA: Diagnosis not present

## 2017-12-02 DIAGNOSIS — J3081 Allergic rhinitis due to animal (cat) (dog) hair and dander: Secondary | ICD-10-CM | POA: Diagnosis not present

## 2017-12-02 DIAGNOSIS — J301 Allergic rhinitis due to pollen: Secondary | ICD-10-CM | POA: Diagnosis not present

## 2017-12-10 DIAGNOSIS — J301 Allergic rhinitis due to pollen: Secondary | ICD-10-CM | POA: Diagnosis not present

## 2017-12-10 DIAGNOSIS — J3081 Allergic rhinitis due to animal (cat) (dog) hair and dander: Secondary | ICD-10-CM | POA: Diagnosis not present

## 2017-12-10 DIAGNOSIS — J3089 Other allergic rhinitis: Secondary | ICD-10-CM | POA: Diagnosis not present

## 2017-12-12 DIAGNOSIS — Z6828 Body mass index (BMI) 28.0-28.9, adult: Secondary | ICD-10-CM | POA: Diagnosis not present

## 2017-12-12 DIAGNOSIS — E663 Overweight: Secondary | ICD-10-CM | POA: Diagnosis not present

## 2017-12-12 DIAGNOSIS — T07XXXA Unspecified multiple injuries, initial encounter: Secondary | ICD-10-CM | POA: Diagnosis not present

## 2017-12-12 DIAGNOSIS — M797 Fibromyalgia: Secondary | ICD-10-CM | POA: Diagnosis not present

## 2017-12-13 DIAGNOSIS — G4733 Obstructive sleep apnea (adult) (pediatric): Secondary | ICD-10-CM | POA: Diagnosis not present

## 2017-12-23 DIAGNOSIS — J3081 Allergic rhinitis due to animal (cat) (dog) hair and dander: Secondary | ICD-10-CM | POA: Diagnosis not present

## 2017-12-23 DIAGNOSIS — J301 Allergic rhinitis due to pollen: Secondary | ICD-10-CM | POA: Diagnosis not present

## 2017-12-23 DIAGNOSIS — J3089 Other allergic rhinitis: Secondary | ICD-10-CM | POA: Diagnosis not present

## 2017-12-31 DIAGNOSIS — J3089 Other allergic rhinitis: Secondary | ICD-10-CM | POA: Diagnosis not present

## 2017-12-31 DIAGNOSIS — J301 Allergic rhinitis due to pollen: Secondary | ICD-10-CM | POA: Diagnosis not present

## 2017-12-31 DIAGNOSIS — J3081 Allergic rhinitis due to animal (cat) (dog) hair and dander: Secondary | ICD-10-CM | POA: Diagnosis not present

## 2018-01-03 DIAGNOSIS — M25562 Pain in left knee: Secondary | ICD-10-CM | POA: Diagnosis not present

## 2018-01-07 DIAGNOSIS — J3081 Allergic rhinitis due to animal (cat) (dog) hair and dander: Secondary | ICD-10-CM | POA: Diagnosis not present

## 2018-01-07 DIAGNOSIS — J3089 Other allergic rhinitis: Secondary | ICD-10-CM | POA: Diagnosis not present

## 2018-01-07 DIAGNOSIS — J301 Allergic rhinitis due to pollen: Secondary | ICD-10-CM | POA: Diagnosis not present

## 2018-01-12 DIAGNOSIS — G4733 Obstructive sleep apnea (adult) (pediatric): Secondary | ICD-10-CM | POA: Diagnosis not present

## 2018-01-14 DIAGNOSIS — J301 Allergic rhinitis due to pollen: Secondary | ICD-10-CM | POA: Diagnosis not present

## 2018-01-14 DIAGNOSIS — J3081 Allergic rhinitis due to animal (cat) (dog) hair and dander: Secondary | ICD-10-CM | POA: Diagnosis not present

## 2018-01-14 DIAGNOSIS — J3089 Other allergic rhinitis: Secondary | ICD-10-CM | POA: Diagnosis not present

## 2018-01-16 DIAGNOSIS — G4733 Obstructive sleep apnea (adult) (pediatric): Secondary | ICD-10-CM | POA: Diagnosis not present

## 2018-01-20 DIAGNOSIS — J301 Allergic rhinitis due to pollen: Secondary | ICD-10-CM | POA: Diagnosis not present

## 2018-01-20 DIAGNOSIS — J3081 Allergic rhinitis due to animal (cat) (dog) hair and dander: Secondary | ICD-10-CM | POA: Diagnosis not present

## 2018-01-20 DIAGNOSIS — J3089 Other allergic rhinitis: Secondary | ICD-10-CM | POA: Diagnosis not present

## 2018-01-24 DIAGNOSIS — J3089 Other allergic rhinitis: Secondary | ICD-10-CM | POA: Diagnosis not present

## 2018-01-24 DIAGNOSIS — J301 Allergic rhinitis due to pollen: Secondary | ICD-10-CM | POA: Diagnosis not present

## 2018-01-24 DIAGNOSIS — J3081 Allergic rhinitis due to animal (cat) (dog) hair and dander: Secondary | ICD-10-CM | POA: Diagnosis not present

## 2018-01-27 DIAGNOSIS — J3089 Other allergic rhinitis: Secondary | ICD-10-CM | POA: Diagnosis not present

## 2018-01-27 DIAGNOSIS — J301 Allergic rhinitis due to pollen: Secondary | ICD-10-CM | POA: Diagnosis not present

## 2018-01-27 DIAGNOSIS — J3081 Allergic rhinitis due to animal (cat) (dog) hair and dander: Secondary | ICD-10-CM | POA: Diagnosis not present

## 2018-02-12 DIAGNOSIS — G4733 Obstructive sleep apnea (adult) (pediatric): Secondary | ICD-10-CM | POA: Diagnosis not present

## 2018-02-21 DIAGNOSIS — J3089 Other allergic rhinitis: Secondary | ICD-10-CM | POA: Diagnosis not present

## 2018-02-21 DIAGNOSIS — J301 Allergic rhinitis due to pollen: Secondary | ICD-10-CM | POA: Diagnosis not present

## 2018-02-21 DIAGNOSIS — J3081 Allergic rhinitis due to animal (cat) (dog) hair and dander: Secondary | ICD-10-CM | POA: Diagnosis not present

## 2018-02-28 DIAGNOSIS — J301 Allergic rhinitis due to pollen: Secondary | ICD-10-CM | POA: Diagnosis not present

## 2018-02-28 DIAGNOSIS — J3089 Other allergic rhinitis: Secondary | ICD-10-CM | POA: Diagnosis not present

## 2018-02-28 DIAGNOSIS — J3081 Allergic rhinitis due to animal (cat) (dog) hair and dander: Secondary | ICD-10-CM | POA: Diagnosis not present

## 2018-03-03 DIAGNOSIS — J301 Allergic rhinitis due to pollen: Secondary | ICD-10-CM | POA: Diagnosis not present

## 2018-03-03 DIAGNOSIS — J3089 Other allergic rhinitis: Secondary | ICD-10-CM | POA: Diagnosis not present

## 2018-03-03 DIAGNOSIS — J3081 Allergic rhinitis due to animal (cat) (dog) hair and dander: Secondary | ICD-10-CM | POA: Diagnosis not present

## 2018-03-07 DIAGNOSIS — J3089 Other allergic rhinitis: Secondary | ICD-10-CM | POA: Diagnosis not present

## 2018-03-07 DIAGNOSIS — J3081 Allergic rhinitis due to animal (cat) (dog) hair and dander: Secondary | ICD-10-CM | POA: Diagnosis not present

## 2018-03-07 DIAGNOSIS — J301 Allergic rhinitis due to pollen: Secondary | ICD-10-CM | POA: Diagnosis not present

## 2018-03-10 DIAGNOSIS — J3081 Allergic rhinitis due to animal (cat) (dog) hair and dander: Secondary | ICD-10-CM | POA: Diagnosis not present

## 2018-03-10 DIAGNOSIS — J3089 Other allergic rhinitis: Secondary | ICD-10-CM | POA: Diagnosis not present

## 2018-03-10 DIAGNOSIS — J301 Allergic rhinitis due to pollen: Secondary | ICD-10-CM | POA: Diagnosis not present

## 2018-03-14 DIAGNOSIS — J3089 Other allergic rhinitis: Secondary | ICD-10-CM | POA: Diagnosis not present

## 2018-03-14 DIAGNOSIS — J301 Allergic rhinitis due to pollen: Secondary | ICD-10-CM | POA: Diagnosis not present

## 2018-03-14 DIAGNOSIS — J3081 Allergic rhinitis due to animal (cat) (dog) hair and dander: Secondary | ICD-10-CM | POA: Diagnosis not present

## 2018-03-15 DIAGNOSIS — G4733 Obstructive sleep apnea (adult) (pediatric): Secondary | ICD-10-CM | POA: Diagnosis not present

## 2018-03-17 DIAGNOSIS — J3081 Allergic rhinitis due to animal (cat) (dog) hair and dander: Secondary | ICD-10-CM | POA: Diagnosis not present

## 2018-03-17 DIAGNOSIS — J3089 Other allergic rhinitis: Secondary | ICD-10-CM | POA: Diagnosis not present

## 2018-03-17 DIAGNOSIS — J301 Allergic rhinitis due to pollen: Secondary | ICD-10-CM | POA: Diagnosis not present

## 2018-03-19 DIAGNOSIS — H1851 Endothelial corneal dystrophy: Secondary | ICD-10-CM | POA: Diagnosis not present

## 2018-03-19 DIAGNOSIS — Z9889 Other specified postprocedural states: Secondary | ICD-10-CM | POA: Diagnosis not present

## 2018-03-19 DIAGNOSIS — H25812 Combined forms of age-related cataract, left eye: Secondary | ICD-10-CM | POA: Diagnosis not present

## 2018-03-19 DIAGNOSIS — Z79899 Other long term (current) drug therapy: Secondary | ICD-10-CM | POA: Diagnosis not present

## 2018-03-19 IMAGING — DX DG ABDOMEN ACUTE W/ 1V CHEST
3 series · 3 of 3 positions shown · non-contrast
Comparison: None.

CLINICAL DATA: Bright red blood per rectum after bowel movement on
[REDACTED].

EXAM:
DG ABDOMEN ACUTE W/ 1V CHEST

[chest pa]
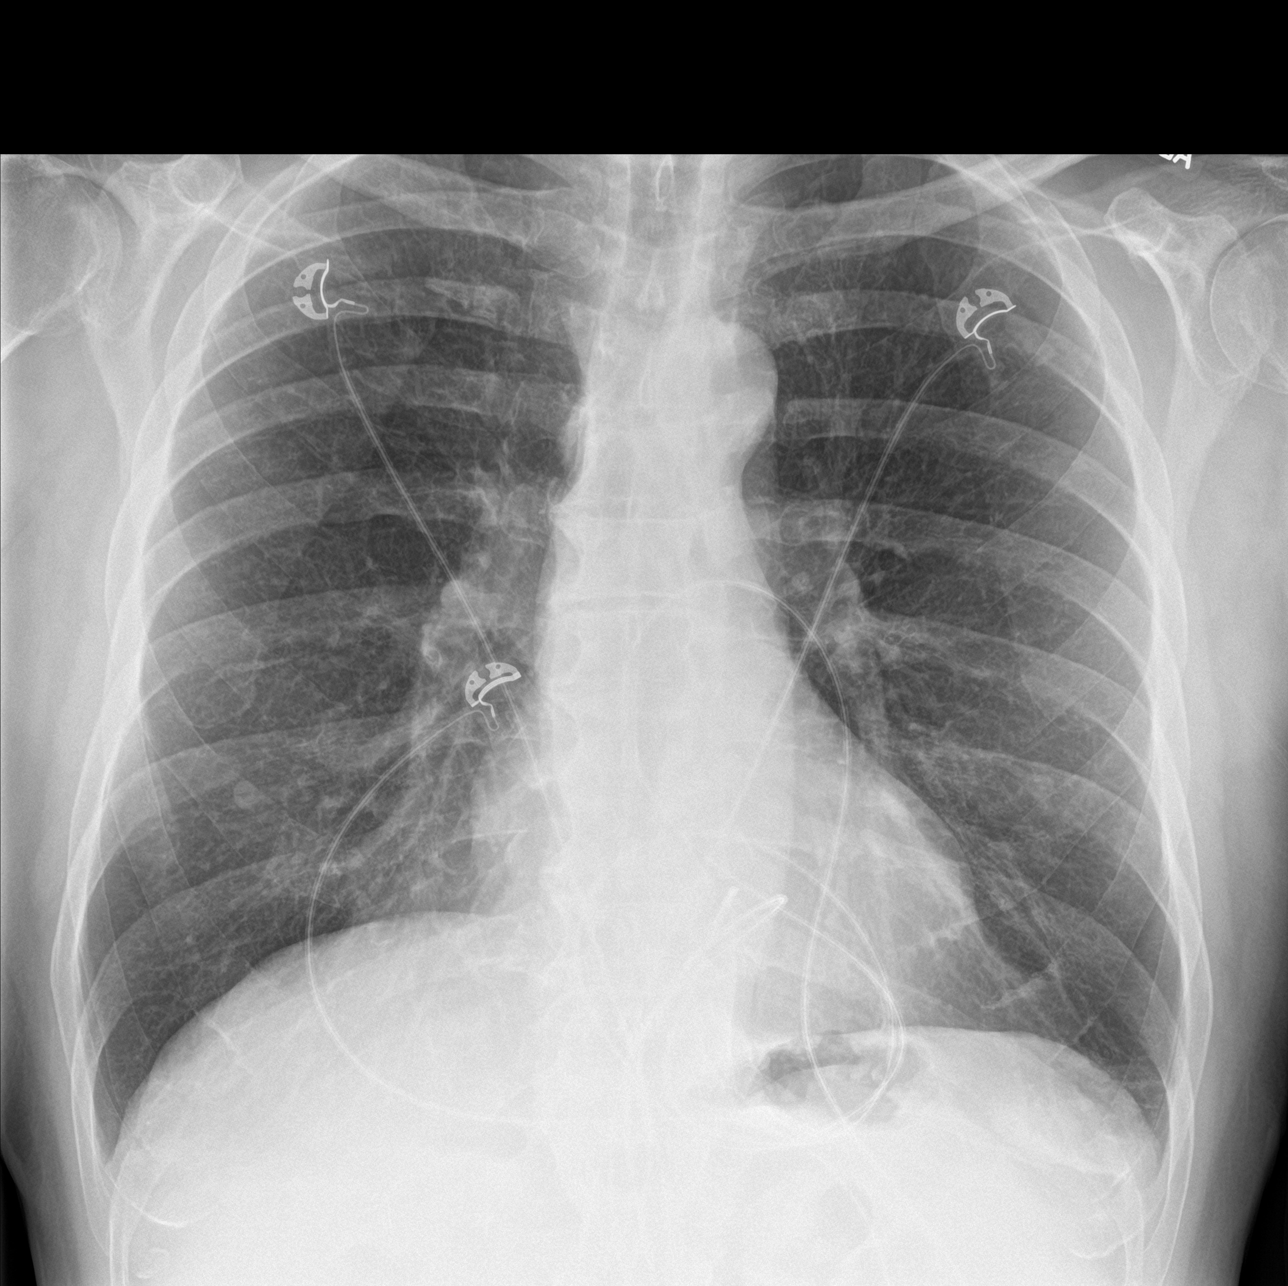

[abdomen erect]
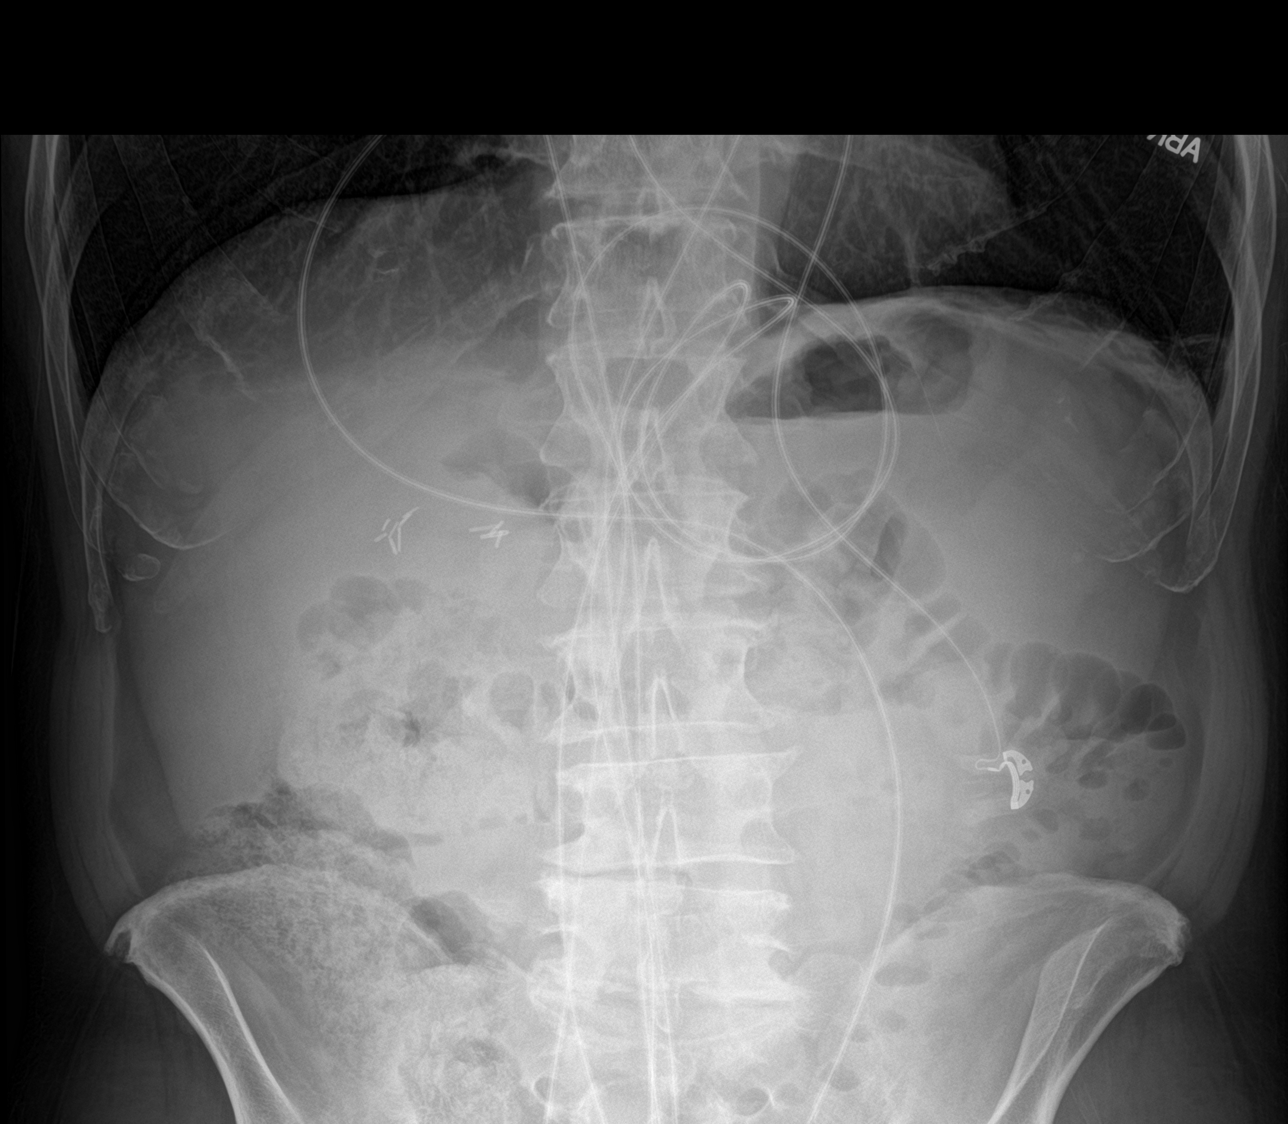

[abdomen supine]
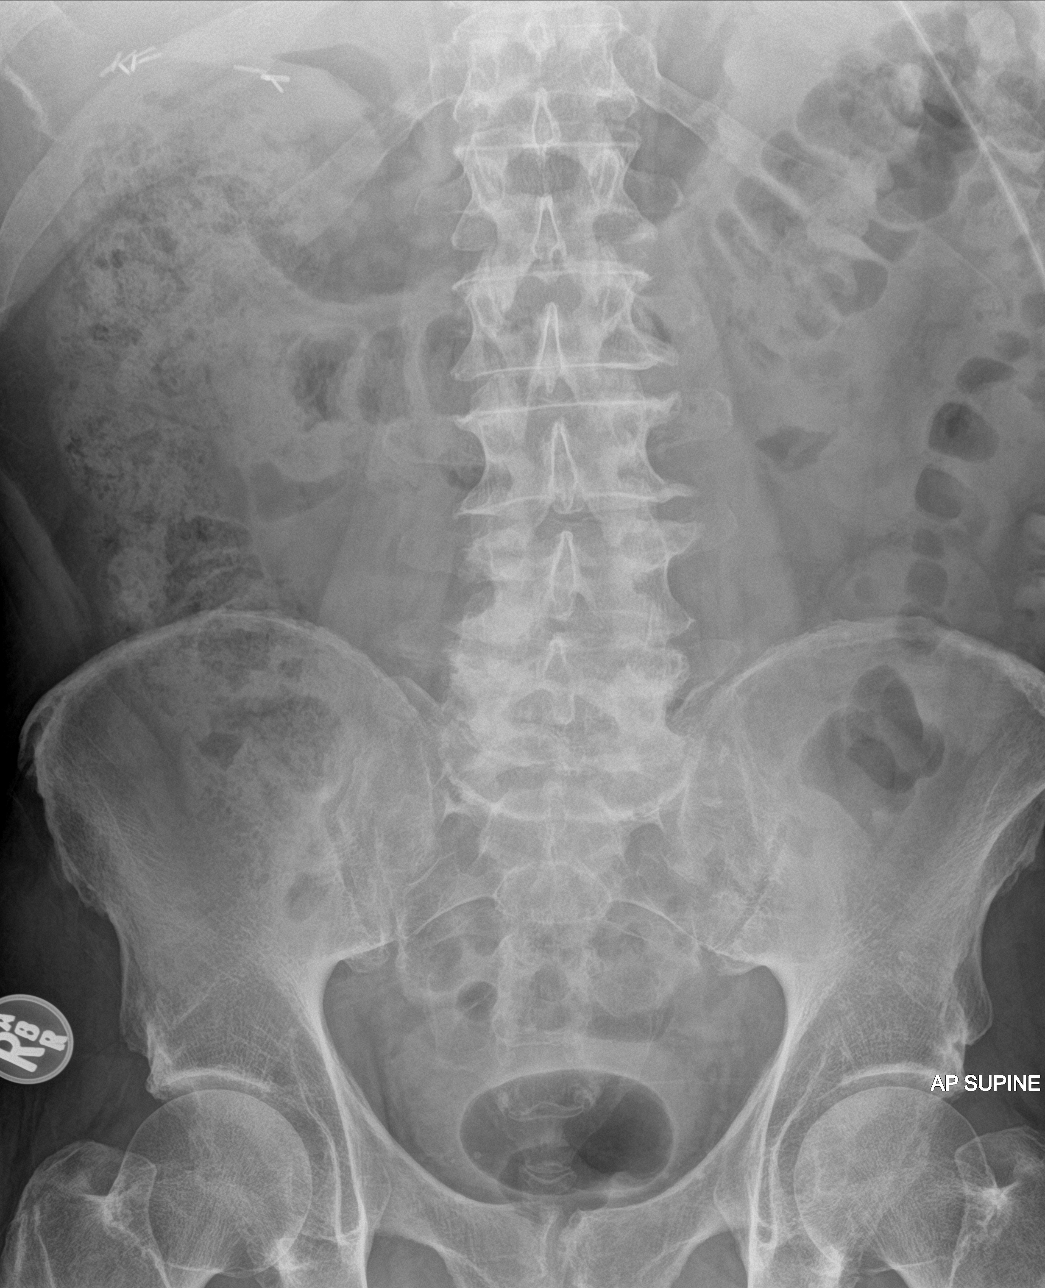

[3 of 3 positions shown; findings below may reference images not displayed]

FINDINGS: There is no evidence of dilated bowel loops or free intraperitoneal
air. A moderate amount of fecal retention is seen within the
ascending move proximal descending colon. Surgical clips are seen in
the right upper quadrant presumably from prior cholecystectomy. No
radiopaque calculi or other significant radiographic abnormality is
seen. Heart size and mediastinal contours are within normal limits.
Both lungs are clear. Nipple shadows are seen bilaterally.
IMPRESSION: Moderate amount of fecal retention within large bowel from cecum
through proximal descending colon. No bowel obstruction. No acute
cardiopulmonary disease.

## 2018-03-21 DIAGNOSIS — J3089 Other allergic rhinitis: Secondary | ICD-10-CM | POA: Diagnosis not present

## 2018-03-21 DIAGNOSIS — J301 Allergic rhinitis due to pollen: Secondary | ICD-10-CM | POA: Diagnosis not present

## 2018-03-21 DIAGNOSIS — J3081 Allergic rhinitis due to animal (cat) (dog) hair and dander: Secondary | ICD-10-CM | POA: Diagnosis not present

## 2018-03-24 DIAGNOSIS — J301 Allergic rhinitis due to pollen: Secondary | ICD-10-CM | POA: Diagnosis not present

## 2018-03-24 DIAGNOSIS — J3081 Allergic rhinitis due to animal (cat) (dog) hair and dander: Secondary | ICD-10-CM | POA: Diagnosis not present

## 2018-03-24 DIAGNOSIS — J3089 Other allergic rhinitis: Secondary | ICD-10-CM | POA: Diagnosis not present

## 2018-03-27 DIAGNOSIS — E782 Mixed hyperlipidemia: Secondary | ICD-10-CM | POA: Diagnosis not present

## 2018-03-27 DIAGNOSIS — Z0001 Encounter for general adult medical examination with abnormal findings: Secondary | ICD-10-CM | POA: Diagnosis not present

## 2018-03-27 DIAGNOSIS — L03116 Cellulitis of left lower limb: Secondary | ICD-10-CM | POA: Diagnosis not present

## 2018-03-27 DIAGNOSIS — J309 Allergic rhinitis, unspecified: Secondary | ICD-10-CM | POA: Diagnosis not present

## 2018-03-27 DIAGNOSIS — M797 Fibromyalgia: Secondary | ICD-10-CM | POA: Diagnosis not present

## 2018-03-27 DIAGNOSIS — K146 Glossodynia: Secondary | ICD-10-CM | POA: Diagnosis not present

## 2018-03-27 DIAGNOSIS — K589 Irritable bowel syndrome without diarrhea: Secondary | ICD-10-CM | POA: Diagnosis not present

## 2018-03-27 DIAGNOSIS — I10 Essential (primary) hypertension: Secondary | ICD-10-CM | POA: Diagnosis not present

## 2018-03-27 DIAGNOSIS — Z6828 Body mass index (BMI) 28.0-28.9, adult: Secondary | ICD-10-CM | POA: Diagnosis not present

## 2018-03-27 DIAGNOSIS — Z1389 Encounter for screening for other disorder: Secondary | ICD-10-CM | POA: Diagnosis not present

## 2018-03-27 DIAGNOSIS — K219 Gastro-esophageal reflux disease without esophagitis: Secondary | ICD-10-CM | POA: Diagnosis not present

## 2018-03-28 DIAGNOSIS — J3081 Allergic rhinitis due to animal (cat) (dog) hair and dander: Secondary | ICD-10-CM | POA: Diagnosis not present

## 2018-03-28 DIAGNOSIS — J3089 Other allergic rhinitis: Secondary | ICD-10-CM | POA: Diagnosis not present

## 2018-03-28 DIAGNOSIS — J301 Allergic rhinitis due to pollen: Secondary | ICD-10-CM | POA: Diagnosis not present

## 2018-04-01 DIAGNOSIS — Z23 Encounter for immunization: Secondary | ICD-10-CM | POA: Diagnosis not present

## 2018-04-01 DIAGNOSIS — Z1389 Encounter for screening for other disorder: Secondary | ICD-10-CM | POA: Diagnosis not present

## 2018-04-01 DIAGNOSIS — Z0001 Encounter for general adult medical examination with abnormal findings: Secondary | ICD-10-CM | POA: Diagnosis not present

## 2018-04-01 DIAGNOSIS — K146 Glossodynia: Secondary | ICD-10-CM | POA: Diagnosis not present

## 2018-04-01 DIAGNOSIS — Z6828 Body mass index (BMI) 28.0-28.9, adult: Secondary | ICD-10-CM | POA: Diagnosis not present

## 2018-04-01 DIAGNOSIS — E782 Mixed hyperlipidemia: Secondary | ICD-10-CM | POA: Diagnosis not present

## 2018-04-02 DIAGNOSIS — J301 Allergic rhinitis due to pollen: Secondary | ICD-10-CM | POA: Diagnosis not present

## 2018-04-02 DIAGNOSIS — J3081 Allergic rhinitis due to animal (cat) (dog) hair and dander: Secondary | ICD-10-CM | POA: Diagnosis not present

## 2018-04-02 DIAGNOSIS — J3089 Other allergic rhinitis: Secondary | ICD-10-CM | POA: Diagnosis not present

## 2018-04-04 DIAGNOSIS — J3089 Other allergic rhinitis: Secondary | ICD-10-CM | POA: Diagnosis not present

## 2018-04-04 DIAGNOSIS — J3081 Allergic rhinitis due to animal (cat) (dog) hair and dander: Secondary | ICD-10-CM | POA: Diagnosis not present

## 2018-04-04 DIAGNOSIS — J301 Allergic rhinitis due to pollen: Secondary | ICD-10-CM | POA: Diagnosis not present

## 2018-04-07 DIAGNOSIS — J3081 Allergic rhinitis due to animal (cat) (dog) hair and dander: Secondary | ICD-10-CM | POA: Diagnosis not present

## 2018-04-07 DIAGNOSIS — J301 Allergic rhinitis due to pollen: Secondary | ICD-10-CM | POA: Diagnosis not present

## 2018-04-07 DIAGNOSIS — J3089 Other allergic rhinitis: Secondary | ICD-10-CM | POA: Diagnosis not present

## 2018-04-11 DIAGNOSIS — J301 Allergic rhinitis due to pollen: Secondary | ICD-10-CM | POA: Diagnosis not present

## 2018-04-14 DIAGNOSIS — J301 Allergic rhinitis due to pollen: Secondary | ICD-10-CM | POA: Diagnosis not present

## 2018-04-14 DIAGNOSIS — G4733 Obstructive sleep apnea (adult) (pediatric): Secondary | ICD-10-CM | POA: Diagnosis not present

## 2018-04-14 DIAGNOSIS — J3089 Other allergic rhinitis: Secondary | ICD-10-CM | POA: Diagnosis not present

## 2018-04-14 DIAGNOSIS — J3081 Allergic rhinitis due to animal (cat) (dog) hair and dander: Secondary | ICD-10-CM | POA: Diagnosis not present

## 2018-04-17 DIAGNOSIS — R682 Dry mouth, unspecified: Secondary | ICD-10-CM | POA: Diagnosis not present

## 2018-04-17 DIAGNOSIS — E663 Overweight: Secondary | ICD-10-CM | POA: Diagnosis not present

## 2018-04-17 DIAGNOSIS — L03119 Cellulitis of unspecified part of limb: Secondary | ICD-10-CM | POA: Diagnosis not present

## 2018-04-17 DIAGNOSIS — Z6828 Body mass index (BMI) 28.0-28.9, adult: Secondary | ICD-10-CM | POA: Diagnosis not present

## 2018-04-23 DIAGNOSIS — J301 Allergic rhinitis due to pollen: Secondary | ICD-10-CM | POA: Diagnosis not present

## 2018-04-23 DIAGNOSIS — J3089 Other allergic rhinitis: Secondary | ICD-10-CM | POA: Diagnosis not present

## 2018-04-23 DIAGNOSIS — J3081 Allergic rhinitis due to animal (cat) (dog) hair and dander: Secondary | ICD-10-CM | POA: Diagnosis not present

## 2018-04-24 ENCOUNTER — Encounter (INDEPENDENT_AMBULATORY_CARE_PROVIDER_SITE_OTHER): Payer: Self-pay | Admitting: Internal Medicine

## 2018-04-24 ENCOUNTER — Ambulatory Visit (INDEPENDENT_AMBULATORY_CARE_PROVIDER_SITE_OTHER): Payer: Medicare HMO | Admitting: Internal Medicine

## 2018-04-24 VITALS — BP 160/80 | HR 60 | Temp 97.6°F | Ht 71.0 in | Wt 202.7 lb

## 2018-04-24 DIAGNOSIS — Z8719 Personal history of other diseases of the digestive system: Secondary | ICD-10-CM

## 2018-04-24 LAB — HEMOGLOBIN AND HEMATOCRIT, BLOOD
HCT: 31.2 % — ABNORMAL LOW (ref 38.5–50.0)
Hemoglobin: 10.6 g/dL — ABNORMAL LOW (ref 13.2–17.1)

## 2018-04-24 NOTE — Progress Notes (Signed)
   Subjective:    Patient ID: Cole Brown, male    DOB: 08/15/1945, 72 y.o.   MRN: [id number]  HPI Here today for fu. Last seen in December of 2018. Hx of diverticular bleed in 2018. Admitted x 1 day. Hemoglobin remained stable during hospitalization. His last colonoscopy was in August  of 2016 which revealed: Multiple diverticula at sigmoid colon. External hemorrhoids. No evidence of colonic polyps. He tells me Monday night he had a bloody stool. Tuesday was okay, very little blood.  Yesterday, he had a bloody stool but not a large one.  This am he had a BM which was normal, but it smelled of blood.  His appetite is okay. No weight loss.    CBC    Component Value Date/Time   WBC 7.2 05/30/2017 1429   RBC 4.64 05/30/2017 1429   HGB 13.7 05/30/2017 1429   HGB 12.7 (L) 12/21/2016 1150   HCT 39.8 05/30/2017 1429   HCT 37.7 12/21/2016 1150   PLT 233 05/30/2017 1429   MCV 85.8 05/30/2017 1429   MCV 85 12/21/2016 1150   MCH 29.5 05/30/2017 1429   MCHC 34.4 05/30/2017 1429   RDW 13.8 05/30/2017 1429   RDW 14.9 12/21/2016 1150   LYMPHSABS 1.3 12/21/2016 1150   MONOABS 980 (H) 11/28/2016 0906   EOSABS 0.1 12/21/2016 1150   BASOSABS 0.0 12/21/2016 1150     Review of Systems     Objective:   Physical Exam Blood pressure (!) 160/80, pulse 60, temperature 97.6 F (36.4 C), height 5\' 11"  (1.803 m), weight 202 lb 11.2 oz (91.9 kg). Alert and oriented. Skin warm and dry. Oral mucosa is moist.   . Sclera anicteric, conjunctivae is pink. Thyroid not enlarged. No cervical lymphadenopathy. Lungs clear. Heart regular rate and rhythm.  Abdomen is soft. Bowel sounds are positive. No hepatomegaly. No abdominal masses felt. No tenderness.  No edema to lower extremities.           Assessment & Plan:  Hx of diverticular bleed. Last colonoscopy in 2016.  Am going to get an H and H.

## 2018-04-24 NOTE — Patient Instructions (Signed)
H and H .  OV in 1 year.

## 2018-04-28 ENCOUNTER — Other Ambulatory Visit (INDEPENDENT_AMBULATORY_CARE_PROVIDER_SITE_OTHER): Payer: Self-pay | Admitting: *Deleted

## 2018-04-28 DIAGNOSIS — J301 Allergic rhinitis due to pollen: Secondary | ICD-10-CM | POA: Diagnosis not present

## 2018-04-28 DIAGNOSIS — Z8719 Personal history of other diseases of the digestive system: Secondary | ICD-10-CM

## 2018-04-28 DIAGNOSIS — D649 Anemia, unspecified: Secondary | ICD-10-CM | POA: Diagnosis not present

## 2018-04-28 DIAGNOSIS — J3081 Allergic rhinitis due to animal (cat) (dog) hair and dander: Secondary | ICD-10-CM | POA: Diagnosis not present

## 2018-04-28 DIAGNOSIS — J3089 Other allergic rhinitis: Secondary | ICD-10-CM | POA: Diagnosis not present

## 2018-05-03 IMAGING — MR MR HEAD WO/W CM
15 series · 45 of 48 positions shown · IV contrast (multihance)
Comparison: None.

CLINICAL DATA: Episodes of bilateral tinnitus, sometimes associated
with a visual Rosa Santos, possible migraine. Headaches. Patient also
reports confusion.

EXAM:
MRI HEAD WITHOUT AND WITH CONTRAST
TECHNIQUE: Multiplanar, multiecho pulse sequences of the brain and surrounding
structures were obtained without and with intravenous contrast.
CONTRAST:  18mL MULTIHANCE GADOBENATE DIMEGLUMINE 529 MG/ML IV SOLN
Creatinine was obtained on site at [HOSPITAL] at [HOSPITAL].
Results: Creatinine 0.9 mg/dL.

[Series 2: T1 · sagittal · 5.0mm · 0.45mm/px · 2 of 23 slices shown (1 of 3)]
[im 1/23]
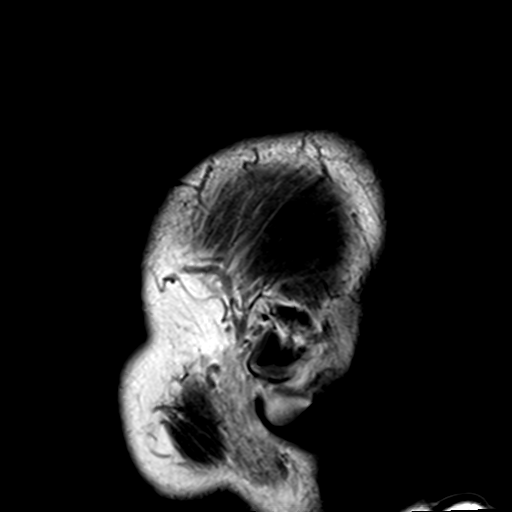
[im 23/23]
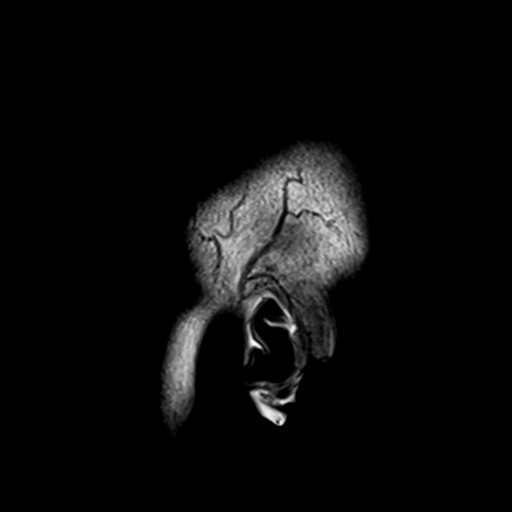

[Series 3: DWI · axial · 3.0mm · 1.80mm/px · z∈[-29,+125]mm · 7 of 108 slices shown (1 of 4)]
[im 1/108]
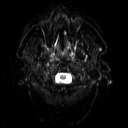
[im 18/108]
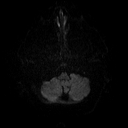
[im 36/108]
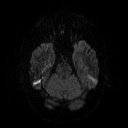
[im 54/108]
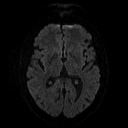
[im 72/108]
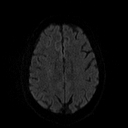
[im 90/108]
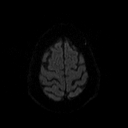
[im 108/108]
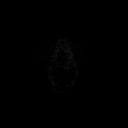

[Series 4: DWI · axial · 3.0mm · 1.80mm/px · z∈[-29,+125]mm · 4 of 54 slices shown (2 of 4)]
[im 1/54]
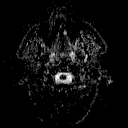
[im 18/54]
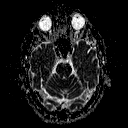
[im 36/54]
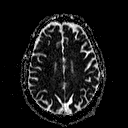
[im 54/54]
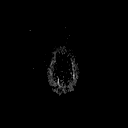

[Series 5: DWI · coronal · 5.0mm · 1.80mm/px · 5 of 76 slices shown (3 of 4)]
[im 1/76]
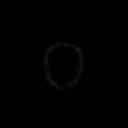
[im 19/76]
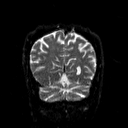
[im 38/76]
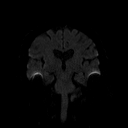
[im 57/76]
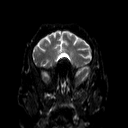
[im 76/76]
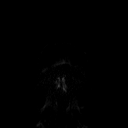

[Series 6: DWI · coronal · 5.0mm · 1.80mm/px · 3 of 38 slices shown (4 of 4)]
[im 1/38]
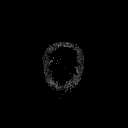
[im 19/38]
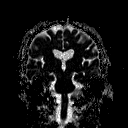
[im 38/38]
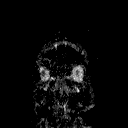

[Series 7: T2 · axial · 5.0mm · 0.45mm/px · z∈[-32,+132]mm · 2 of 27 slices shown]
[im 1/27]
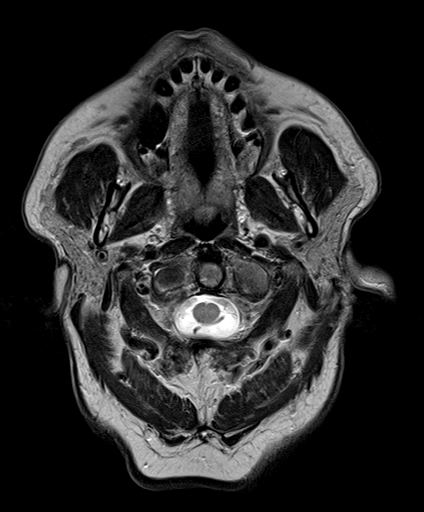
[im 27/27]
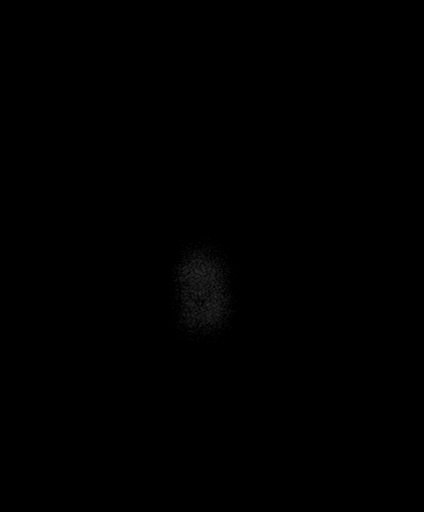

[Series 8: FLAIR · axial · 3.0mm · 0.45mm/px · z∈[-31,+127]mm · 2 of 36 slices shown]
[im 1/36]
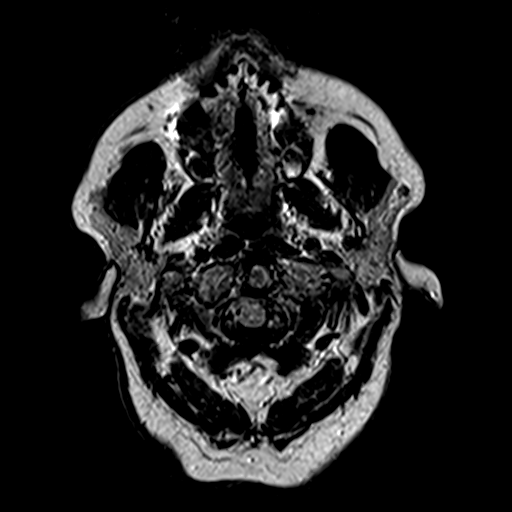
[im 36/36]
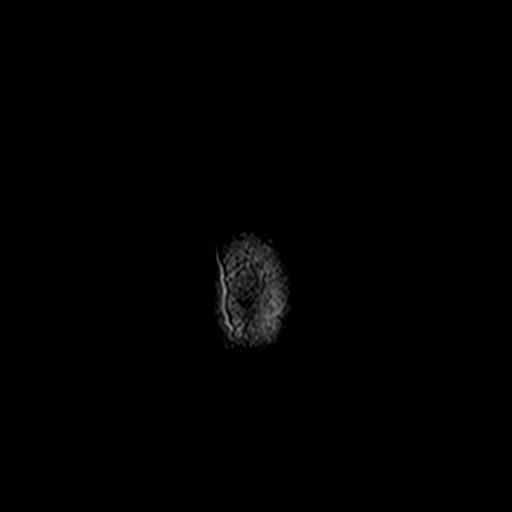

[Series 9: mip_images(sw) · axial · 16.0mm · 0.90mm/px · z∈[-27,+127]mm · 5 of 81 slices shown]
[im 1/81]
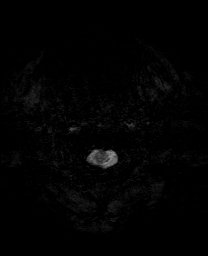
[im 21/81]
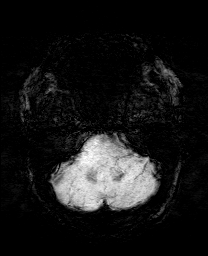
[im 41/81]
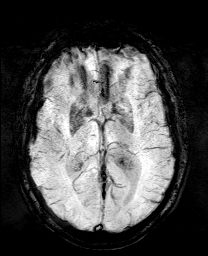
[im 61/81]
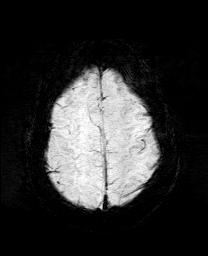
[im 81/81]
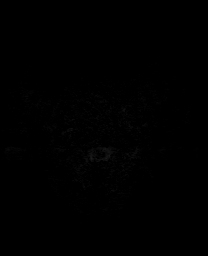

[Series 10: swi_images · axial · 2.0mm · 0.90mm/px · z∈[-34,+134]mm · 6 of 88 slices shown]
[im 1/88]
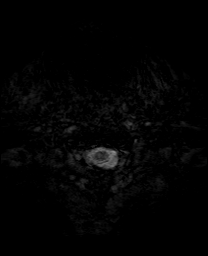
[im 18/88]
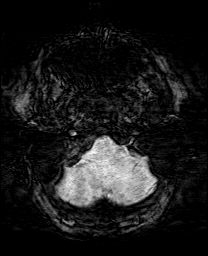
[im 35/88]
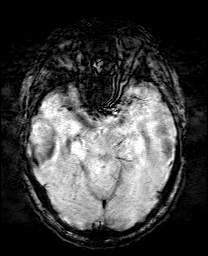
[im 53/88]
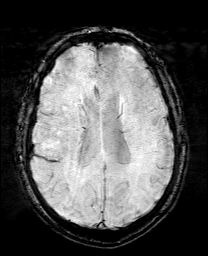
[im 70/88]
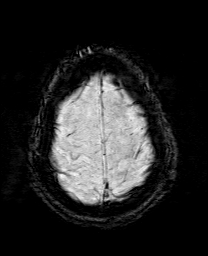
[im 88/88]
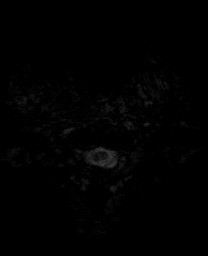

[Series 11: T1 · coronal · 3.0mm · 0.35mm/px · 1 of 15 slices shown (2 of 3)]
[im 1/15]
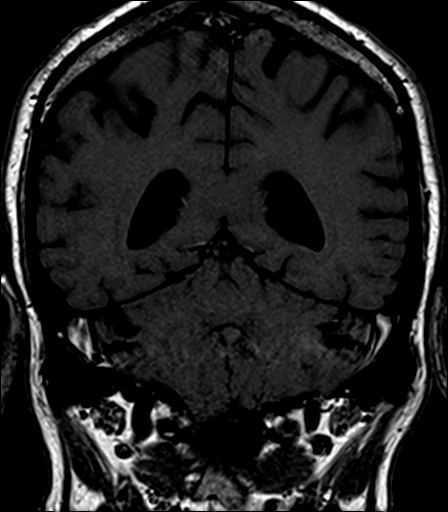

[Series 12: T1 · axial · 3.0mm · 0.35mm/px · 1 of 15 slices shown (3 of 3)]
[im 1/15]
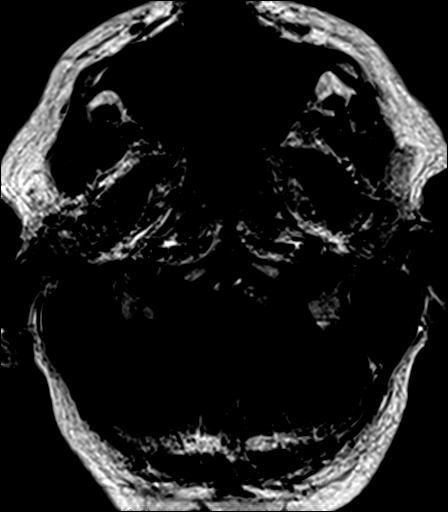

[Series 14: bSSFP · axial · 1.0mm · 0.28mm/px · z∈[-7,+27]mm · 2 of 36 slices shown]
[im 1/36]
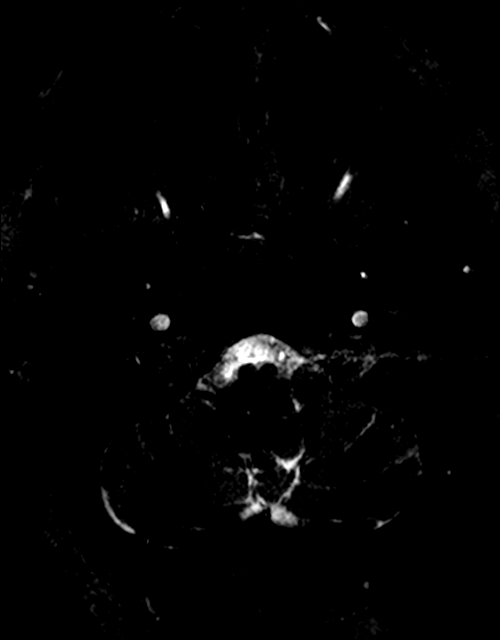
[im 36/36]
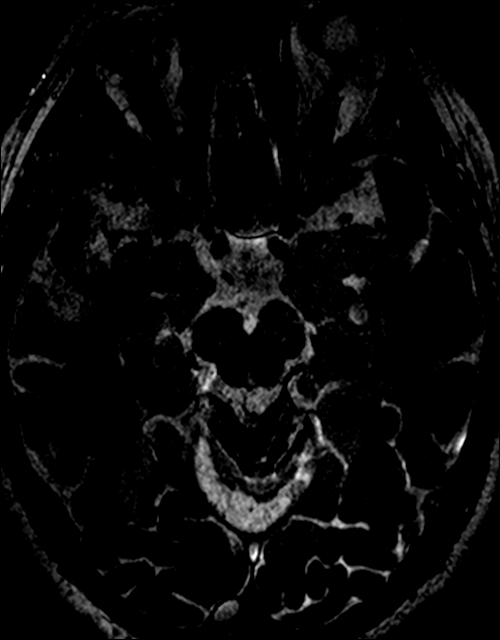

[Series 15: T1 post-contrast · coronal · 3.0mm · 0.35mm/px · 1 of 15 slices shown (1 of 2)]
[im 1/15]
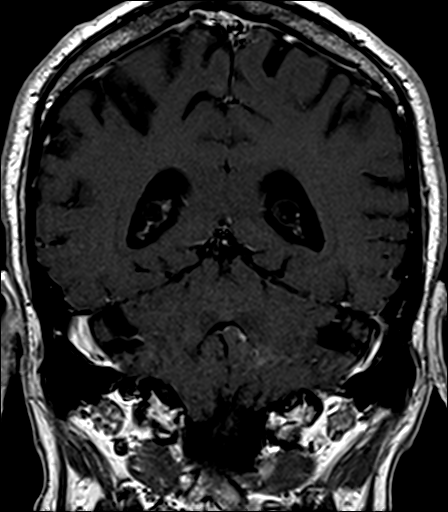

[Series 16: T1 post-contrast · axial · 3.0mm · 0.35mm/px · 1 of 15 slices shown (2 of 2)]
[im 1/15]
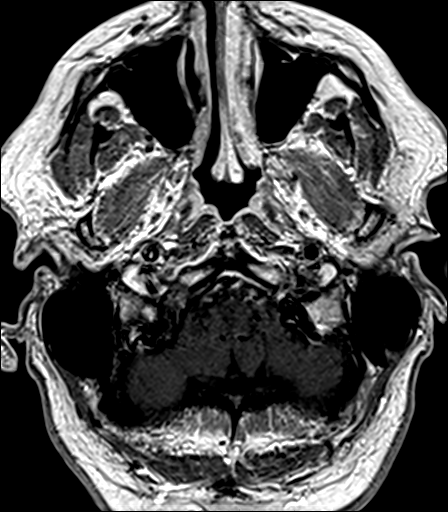

[Series 17: post_t1_mpr_tra · axial · 2.0mm · 0.45mm/px · z∈[-33,+33]mm · 3 of 88 slices shown]
[im 1/88]
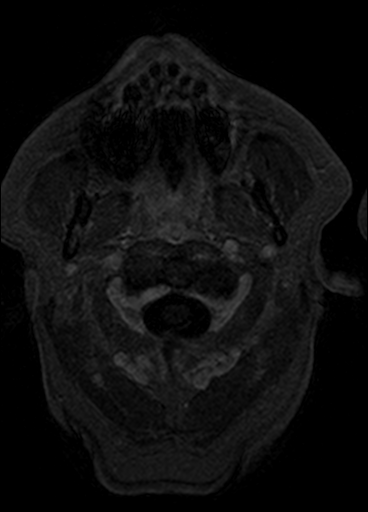
[im 18/88]
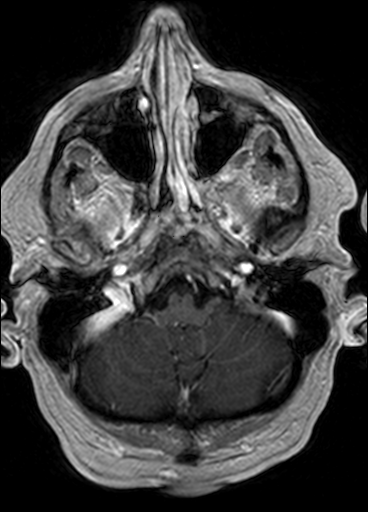
[im 35/88]
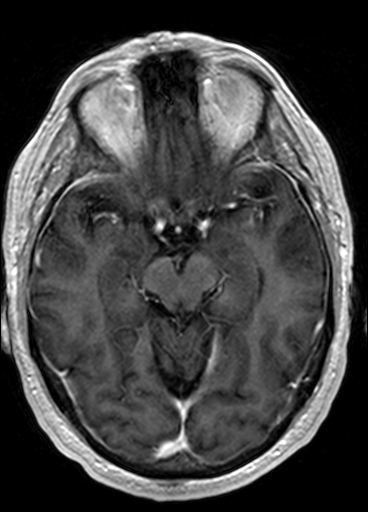

[45 of 48 positions shown; findings below may reference images not displayed]

FINDINGS: Brain: No evidence for acute infarction, hemorrhage, mass lesion,
hydrocephalus, or extra-axial fluid. Mild to moderate atrophy. Mild
subcortical and periventricular T2 and FLAIR hyperintensities,
likely chronic microvascular ischemic change.

Post infusion, no abnormal enhancement of the brain or meninges.

Thin-section imaging through the posterior fossa reveals no evidence
for vestibular schwannoma. There is no posterior fossa mass or
temporal bone inflammatory process. No vascular loop is seen.

Vascular: Normal flow voids.

Skull and upper cervical spine: Abnormally shaped peg-like
cerebellar tonsils. Tonsillar descent is greater on the RIGHT than
LEFT, up to 8 mm as seen on sagittal series 2, image 10. Correlate
clinically for symptoms related to to Chiari I malformation.

Sinuses/Orbits: No layering sinus fluid.

Other: None.
IMPRESSION: Atrophy and small vessel disease. No acute intracranial findings. No
abnormal postcontrast enhancement.

No abnormal findings related to the internal auditory canals or
temporal bone.

Abnormally shaped peg-like cerebellar tonsils, up to 8 mm descent
below the foramen magnum. Correlate clinically for Chiari I
malformation as a cause for the patient's BILATERAL tinnitus and
headache symptoms.

These results will be called to the ordering clinician or
representative by the Radiologist Assistant, and communication
documented in the PACS or zVision Dashboard.

## 2018-05-05 DIAGNOSIS — J3081 Allergic rhinitis due to animal (cat) (dog) hair and dander: Secondary | ICD-10-CM | POA: Diagnosis not present

## 2018-05-05 DIAGNOSIS — J3089 Other allergic rhinitis: Secondary | ICD-10-CM | POA: Diagnosis not present

## 2018-05-05 DIAGNOSIS — J301 Allergic rhinitis due to pollen: Secondary | ICD-10-CM | POA: Diagnosis not present

## 2018-05-06 DIAGNOSIS — H25812 Combined forms of age-related cataract, left eye: Secondary | ICD-10-CM | POA: Diagnosis not present

## 2018-05-06 DIAGNOSIS — H1851 Endothelial corneal dystrophy: Secondary | ICD-10-CM | POA: Diagnosis not present

## 2018-05-06 DIAGNOSIS — I10 Essential (primary) hypertension: Secondary | ICD-10-CM | POA: Diagnosis not present

## 2018-05-06 DIAGNOSIS — G4733 Obstructive sleep apnea (adult) (pediatric): Secondary | ICD-10-CM | POA: Diagnosis not present

## 2018-05-07 ENCOUNTER — Encounter (INDEPENDENT_AMBULATORY_CARE_PROVIDER_SITE_OTHER): Payer: Self-pay | Admitting: *Deleted

## 2018-05-07 ENCOUNTER — Other Ambulatory Visit (INDEPENDENT_AMBULATORY_CARE_PROVIDER_SITE_OTHER): Payer: Self-pay | Admitting: *Deleted

## 2018-05-07 DIAGNOSIS — Z961 Presence of intraocular lens: Secondary | ICD-10-CM | POA: Diagnosis not present

## 2018-05-07 DIAGNOSIS — Z48810 Encounter for surgical aftercare following surgery on the sense organs: Secondary | ICD-10-CM | POA: Diagnosis not present

## 2018-05-07 DIAGNOSIS — Z8719 Personal history of other diseases of the digestive system: Secondary | ICD-10-CM

## 2018-05-07 DIAGNOSIS — Z9889 Other specified postprocedural states: Secondary | ICD-10-CM | POA: Diagnosis not present

## 2018-05-07 DIAGNOSIS — Z79899 Other long term (current) drug therapy: Secondary | ICD-10-CM | POA: Diagnosis not present

## 2018-05-08 DIAGNOSIS — D509 Iron deficiency anemia, unspecified: Secondary | ICD-10-CM | POA: Diagnosis not present

## 2018-05-08 DIAGNOSIS — E663 Overweight: Secondary | ICD-10-CM | POA: Diagnosis not present

## 2018-05-08 DIAGNOSIS — M2550 Pain in unspecified joint: Secondary | ICD-10-CM | POA: Diagnosis not present

## 2018-05-08 DIAGNOSIS — Z6828 Body mass index (BMI) 28.0-28.9, adult: Secondary | ICD-10-CM | POA: Diagnosis not present

## 2018-05-12 DIAGNOSIS — R700 Elevated erythrocyte sedimentation rate: Secondary | ICD-10-CM | POA: Diagnosis not present

## 2018-05-14 DIAGNOSIS — J3089 Other allergic rhinitis: Secondary | ICD-10-CM | POA: Diagnosis not present

## 2018-05-14 DIAGNOSIS — J3081 Allergic rhinitis due to animal (cat) (dog) hair and dander: Secondary | ICD-10-CM | POA: Diagnosis not present

## 2018-05-14 DIAGNOSIS — J301 Allergic rhinitis due to pollen: Secondary | ICD-10-CM | POA: Diagnosis not present

## 2018-05-14 DIAGNOSIS — Z48810 Encounter for surgical aftercare following surgery on the sense organs: Secondary | ICD-10-CM | POA: Diagnosis not present

## 2018-05-15 DIAGNOSIS — G4733 Obstructive sleep apnea (adult) (pediatric): Secondary | ICD-10-CM | POA: Diagnosis not present

## 2018-05-19 DIAGNOSIS — J3081 Allergic rhinitis due to animal (cat) (dog) hair and dander: Secondary | ICD-10-CM | POA: Diagnosis not present

## 2018-05-19 DIAGNOSIS — J3089 Other allergic rhinitis: Secondary | ICD-10-CM | POA: Diagnosis not present

## 2018-05-19 DIAGNOSIS — J301 Allergic rhinitis due to pollen: Secondary | ICD-10-CM | POA: Diagnosis not present

## 2018-05-26 DIAGNOSIS — J301 Allergic rhinitis due to pollen: Secondary | ICD-10-CM | POA: Diagnosis not present

## 2018-05-26 DIAGNOSIS — J3089 Other allergic rhinitis: Secondary | ICD-10-CM | POA: Diagnosis not present

## 2018-05-26 DIAGNOSIS — J3081 Allergic rhinitis due to animal (cat) (dog) hair and dander: Secondary | ICD-10-CM | POA: Diagnosis not present

## 2018-05-29 DIAGNOSIS — Z6828 Body mass index (BMI) 28.0-28.9, adult: Secondary | ICD-10-CM | POA: Diagnosis not present

## 2018-05-29 DIAGNOSIS — Z23 Encounter for immunization: Secondary | ICD-10-CM | POA: Diagnosis not present

## 2018-05-29 DIAGNOSIS — E663 Overweight: Secondary | ICD-10-CM | POA: Diagnosis not present

## 2018-05-29 DIAGNOSIS — M2550 Pain in unspecified joint: Secondary | ICD-10-CM | POA: Diagnosis not present

## 2018-05-30 DIAGNOSIS — Z48810 Encounter for surgical aftercare following surgery on the sense organs: Secondary | ICD-10-CM | POA: Diagnosis not present

## 2018-05-30 DIAGNOSIS — Z961 Presence of intraocular lens: Secondary | ICD-10-CM | POA: Diagnosis not present

## 2018-05-30 DIAGNOSIS — Z79899 Other long term (current) drug therapy: Secondary | ICD-10-CM | POA: Diagnosis not present

## 2018-06-02 ENCOUNTER — Ambulatory Visit (INDEPENDENT_AMBULATORY_CARE_PROVIDER_SITE_OTHER): Payer: Medicare HMO | Admitting: Internal Medicine

## 2018-06-06 DIAGNOSIS — J3089 Other allergic rhinitis: Secondary | ICD-10-CM | POA: Diagnosis not present

## 2018-06-06 DIAGNOSIS — J3081 Allergic rhinitis due to animal (cat) (dog) hair and dander: Secondary | ICD-10-CM | POA: Diagnosis not present

## 2018-06-06 DIAGNOSIS — J301 Allergic rhinitis due to pollen: Secondary | ICD-10-CM | POA: Diagnosis not present

## 2018-06-09 DIAGNOSIS — J3081 Allergic rhinitis due to animal (cat) (dog) hair and dander: Secondary | ICD-10-CM | POA: Diagnosis not present

## 2018-06-09 DIAGNOSIS — J301 Allergic rhinitis due to pollen: Secondary | ICD-10-CM | POA: Diagnosis not present

## 2018-06-09 DIAGNOSIS — J3089 Other allergic rhinitis: Secondary | ICD-10-CM | POA: Diagnosis not present

## 2018-06-09 DIAGNOSIS — Z8719 Personal history of other diseases of the digestive system: Secondary | ICD-10-CM | POA: Diagnosis not present

## 2018-06-09 LAB — CBC
HCT: 35.5 % — ABNORMAL LOW (ref 38.5–50.0)
Hemoglobin: 11.9 g/dL — ABNORMAL LOW (ref 13.2–17.1)
MCH: 27.7 pg (ref 27.0–33.0)
MCHC: 33.5 g/dL (ref 32.0–36.0)
MCV: 82.6 fL (ref 80.0–100.0)
MPV: 9.2 fL (ref 7.5–12.5)
Platelets: 317 10*3/uL (ref 140–400)
RBC: 4.3 10*6/uL (ref 4.20–5.80)
RDW: 13.6 % (ref 11.0–15.0)
WBC: 7.4 10*3/uL (ref 3.8–10.8)

## 2018-06-13 ENCOUNTER — Other Ambulatory Visit (INDEPENDENT_AMBULATORY_CARE_PROVIDER_SITE_OTHER): Payer: Self-pay | Admitting: *Deleted

## 2018-06-13 DIAGNOSIS — Z8719 Personal history of other diseases of the digestive system: Secondary | ICD-10-CM

## 2018-06-14 DIAGNOSIS — G4733 Obstructive sleep apnea (adult) (pediatric): Secondary | ICD-10-CM | POA: Diagnosis not present

## 2018-06-16 DIAGNOSIS — J3089 Other allergic rhinitis: Secondary | ICD-10-CM | POA: Diagnosis not present

## 2018-06-16 DIAGNOSIS — J301 Allergic rhinitis due to pollen: Secondary | ICD-10-CM | POA: Diagnosis not present

## 2018-06-16 DIAGNOSIS — J3081 Allergic rhinitis due to animal (cat) (dog) hair and dander: Secondary | ICD-10-CM | POA: Diagnosis not present

## 2018-06-20 DIAGNOSIS — E663 Overweight: Secondary | ICD-10-CM | POA: Diagnosis not present

## 2018-06-20 DIAGNOSIS — I7300 Raynaud's syndrome without gangrene: Secondary | ICD-10-CM | POA: Diagnosis not present

## 2018-06-20 DIAGNOSIS — M064 Inflammatory polyarthropathy: Secondary | ICD-10-CM | POA: Diagnosis not present

## 2018-06-20 DIAGNOSIS — Z6828 Body mass index (BMI) 28.0-28.9, adult: Secondary | ICD-10-CM | POA: Diagnosis not present

## 2018-06-27 DIAGNOSIS — J3089 Other allergic rhinitis: Secondary | ICD-10-CM | POA: Diagnosis not present

## 2018-06-27 DIAGNOSIS — J301 Allergic rhinitis due to pollen: Secondary | ICD-10-CM | POA: Diagnosis not present

## 2018-06-27 DIAGNOSIS — J3081 Allergic rhinitis due to animal (cat) (dog) hair and dander: Secondary | ICD-10-CM | POA: Diagnosis not present

## 2018-07-04 DIAGNOSIS — J3089 Other allergic rhinitis: Secondary | ICD-10-CM | POA: Diagnosis not present

## 2018-07-04 DIAGNOSIS — J301 Allergic rhinitis due to pollen: Secondary | ICD-10-CM | POA: Diagnosis not present

## 2018-07-04 DIAGNOSIS — G4733 Obstructive sleep apnea (adult) (pediatric): Secondary | ICD-10-CM | POA: Diagnosis not present

## 2018-07-04 DIAGNOSIS — J3081 Allergic rhinitis due to animal (cat) (dog) hair and dander: Secondary | ICD-10-CM | POA: Diagnosis not present

## 2018-07-07 DIAGNOSIS — J3089 Other allergic rhinitis: Secondary | ICD-10-CM | POA: Diagnosis not present

## 2018-07-07 DIAGNOSIS — J3081 Allergic rhinitis due to animal (cat) (dog) hair and dander: Secondary | ICD-10-CM | POA: Diagnosis not present

## 2018-07-07 DIAGNOSIS — J301 Allergic rhinitis due to pollen: Secondary | ICD-10-CM | POA: Diagnosis not present

## 2018-07-14 DIAGNOSIS — J301 Allergic rhinitis due to pollen: Secondary | ICD-10-CM | POA: Diagnosis not present

## 2018-07-14 DIAGNOSIS — J3089 Other allergic rhinitis: Secondary | ICD-10-CM | POA: Diagnosis not present

## 2018-07-14 DIAGNOSIS — J3081 Allergic rhinitis due to animal (cat) (dog) hair and dander: Secondary | ICD-10-CM | POA: Diagnosis not present

## 2018-07-15 DIAGNOSIS — G4733 Obstructive sleep apnea (adult) (pediatric): Secondary | ICD-10-CM | POA: Diagnosis not present

## 2018-07-18 DIAGNOSIS — Z6828 Body mass index (BMI) 28.0-28.9, adult: Secondary | ICD-10-CM | POA: Diagnosis not present

## 2018-07-18 DIAGNOSIS — E291 Testicular hypofunction: Secondary | ICD-10-CM | POA: Diagnosis not present

## 2018-07-18 DIAGNOSIS — Z1389 Encounter for screening for other disorder: Secondary | ICD-10-CM | POA: Diagnosis not present

## 2018-07-18 DIAGNOSIS — E663 Overweight: Secondary | ICD-10-CM | POA: Diagnosis not present

## 2018-07-18 DIAGNOSIS — M2540 Effusion, unspecified joint: Secondary | ICD-10-CM | POA: Diagnosis not present

## 2018-07-23 DIAGNOSIS — Z961 Presence of intraocular lens: Secondary | ICD-10-CM | POA: Diagnosis not present

## 2018-07-25 DIAGNOSIS — J3089 Other allergic rhinitis: Secondary | ICD-10-CM | POA: Diagnosis not present

## 2018-07-25 DIAGNOSIS — J301 Allergic rhinitis due to pollen: Secondary | ICD-10-CM | POA: Diagnosis not present

## 2018-07-25 DIAGNOSIS — J3081 Allergic rhinitis due to animal (cat) (dog) hair and dander: Secondary | ICD-10-CM | POA: Diagnosis not present

## 2018-07-28 DIAGNOSIS — J3081 Allergic rhinitis due to animal (cat) (dog) hair and dander: Secondary | ICD-10-CM | POA: Diagnosis not present

## 2018-07-28 DIAGNOSIS — J3089 Other allergic rhinitis: Secondary | ICD-10-CM | POA: Diagnosis not present

## 2018-07-28 DIAGNOSIS — J301 Allergic rhinitis due to pollen: Secondary | ICD-10-CM | POA: Diagnosis not present

## 2018-07-30 DIAGNOSIS — M1389 Other specified arthritis, multiple sites: Secondary | ICD-10-CM | POA: Diagnosis not present

## 2018-08-04 DIAGNOSIS — J3089 Other allergic rhinitis: Secondary | ICD-10-CM | POA: Diagnosis not present

## 2018-08-04 DIAGNOSIS — J3081 Allergic rhinitis due to animal (cat) (dog) hair and dander: Secondary | ICD-10-CM | POA: Diagnosis not present

## 2018-08-04 DIAGNOSIS — J301 Allergic rhinitis due to pollen: Secondary | ICD-10-CM | POA: Diagnosis not present

## 2018-08-05 DIAGNOSIS — Z6827 Body mass index (BMI) 27.0-27.9, adult: Secondary | ICD-10-CM | POA: Diagnosis not present

## 2018-08-05 DIAGNOSIS — G4700 Insomnia, unspecified: Secondary | ICD-10-CM | POA: Diagnosis not present

## 2018-08-05 DIAGNOSIS — E291 Testicular hypofunction: Secondary | ICD-10-CM | POA: Diagnosis not present

## 2018-08-05 DIAGNOSIS — M797 Fibromyalgia: Secondary | ICD-10-CM | POA: Diagnosis not present

## 2018-08-08 NOTE — Progress Notes (Signed)
Office Visit Note  Patient: Cole Brown             Date of Birth: 05-01-1946           MRN: [id number]             PCP: Redmond School, MD Referring: Jake Samples, [place]* Visit Date: 08/21/2018 Occupation: Retired, Nature conservation officer  Subjective:  Swelling in joints.   History of Present Illness: Cole Brown is a 73 y.o. male in consultation per request of his PCP.  According to patient he has had problems with cold hands during wintertime for several years.  He states in June 2019 he had a tick bite with some redness on his lower extremity and bilateral knee joint swelling more prominent in his left knee.  He states at the time he was given doxycycline for 1 week and his symptoms got better.  The symptoms recurred in October 2019 with increased bilateral knee joint swelling more prominent in his left knee joint.  At the time he was also experiencing swelling in his bilateral hands and stiffness in his right shoulder.  He states he did not have any Lyme testing done but he was given doxycycline for 21 days.  He states the symptoms gradually improved but he continued to have pain and swelling in his bilateral hands.  He also has discomfort in his knee joints off and on and right shoulder joint pain.  He states that the [name] symptoms have improved.  He also had recent labs which showed elevated sedimentation rate.  He was diagnosed with fibromyalgia syndrome about 15 years ago from for generalized pain and discomfort.  He continues to have some generalized pain and discomfort.  Activities of Daily Living:  Patient reports morning stiffness for 0 minute.   Patient Denies nocturnal pain.  Difficulty dressing/grooming: Denies Difficulty climbing stairs: Denies Difficulty getting out of chair: Reports Difficulty using hands for taps, buttons, cutlery, and/or writing: Reports  Review of Systems  Constitutional: Positive for fatigue. Negative for night sweats.  HENT: Positive  for mouth dryness. Negative for mouth sores and nose dryness.   Eyes: Negative for redness and dryness.  Respiratory: Negative for shortness of breath and difficulty breathing.   Cardiovascular: Negative for chest pain, palpitations, hypertension, irregular heartbeat and swelling in legs/feet.  Gastrointestinal: Negative for blood in stool, constipation and diarrhea.  Endocrine: Positive for increased urination.  Genitourinary: Negative for difficulty urinating and painful urination.  Musculoskeletal: Positive for arthralgias, joint pain, joint swelling, myalgias, muscle weakness, muscle tenderness and myalgias. Negative for morning stiffness.  Skin: Negative for color change, rash, hair loss, nodules/bumps, skin tightness, ulcers and sensitivity to sunlight.  Allergic/Immunologic: Negative for susceptible to infections.  Neurological: Positive for headaches. Negative for dizziness, fainting, memory loss, night sweats and weakness ( ).  Hematological: Negative for bruising/bleeding tendency and swollen glands.  Psychiatric/Behavioral: Negative for depressed mood and sleep disturbance. The patient is not nervous/anxious.     PMFS History:  Patient Active Problem List   Diagnosis Date Noted  . Hypercholesteremia 08/21/2018  . History of diverticulitis 08/21/2018  . History of iron deficiency anemia 08/21/2018  . 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 Fibromyalgia 08/21/2018  . History of multiple allergies 08/21/2018  . History of sleep apnea 08/21/2018  . Rectal bleed 11/14/2016  . Essential hypertension 11/14/2016    Past Medical History:  Diagnosis Date  . Diverticula of colon    2016 colonoscopy  . Fibromyalgia   .  Fibromyalgia   . History of GI diverticular bleed   . Hypercholesteremia   . Hypertension     Family History  Problem Relation Age of Onset  . Heart disease Mother   . Pancreatic cancer Mother   . Prostate cancer Father   . Healthy Sister   . Parkinson's  disease Brother   . Asthma Sister   . Healthy Sister    Past Surgical History:  Procedure Laterality Date  . allergy shots  weekly  . CHOLECYSTECTOMY    . COLONOSCOPY     Dr.Rehman  . COLONOSCOPY N/A 01/27/2015   Procedure: COLONOSCOPY;  Surgeon: Rogene Houston, MD;  Location: [facility];  Service: Endoscopy;  Laterality: N/A;  930  . EYE SURGERY Bilateral    partial cornea transplants   . GALLBLADDER SURGERY  12/1993  . UPPER GASTROINTESTINAL ENDOSCOPY     Social History   Social History Narrative  . Not on file    There is no immunization history on file for this patient.   Objective: Vital Signs: BP 139/88 (BP Location: Right Arm, Patient Position: Sitting, Cuff Size: Normal)   Pulse 73   Resp 15   Ht 5' 11"  (1.803 m)   Wt 200 lb 12.8 oz (91.1 kg)   BMI 28.01 kg/m    Physical Exam Vitals signs and nursing note reviewed.  Constitutional:      Appearance: He is well-developed.  HENT:     Head: Normocephalic and atraumatic.  Eyes:     Conjunctiva/sclera: Conjunctivae normal.     Pupils: Pupils are equal, round, and reactive to light.  Neck:     Musculoskeletal: Normal range of motion and neck supple.  Cardiovascular:     Rate and Rhythm: Normal rate and regular rhythm.     Heart sounds: Normal heart sounds.  Pulmonary:     Effort: Pulmonary effort is normal.     Breath sounds: Normal breath sounds.  Abdominal:     General: Bowel sounds are normal.     Palpations: Abdomen is soft.  Skin:    General: Skin is warm and dry.     Capillary Refill: Capillary refill takes less than 2 seconds.  Neurological:     Mental Status: He is alert and oriented to person, place, and time.  Psychiatric:        Behavior: Behavior normal.      Musculoskeletal Exam: C-spine thoracic and lumbar spine good range of motion.  He had no SI joint tenderness on examination.  Shoulder joints elbow joints were in good range of motion.  He had limited range of motion of bilateral  wrist joint with severe synovitis.  He has synovitis over several of his MCPs and PIP joints as described below.  Hip joints and knee joints were in good range of motion.  He appears to have valgus deformity in his bilateral knee joints.  He has effusion in his bilateral knees more prominent in his right knee.  He has some tenderness over right ankle joint without any swelling.  He had osteoarthritic changes in his feet with DIP and PIP thickening without synovitis.  CDAI Exam: CDAI Score: 30.4  Patient Global Assessment: 6 (mm); Provider Global Assessment: 8 (mm) Swollen: 14 ; Tender: 16  Joint Exam      Right  Left  Glenohumeral   Tender     Wrist  Swollen Tender  Swollen Tender  MCP 2  Swollen Tender  Swollen Tender  MCP 3  Swollen Tender  Swollen Tender  PIP 2  Swollen Tender  Swollen Tender  PIP 3  Swollen Tender  Swollen Tender  PIP 4  Swollen Tender  Swollen Tender  Knee  Swollen Tender  Swollen Tender  Ankle   Tender        Investigation: No additional findings.  Imaging: Xr Foot 2 Views Left  Result Date: 08/21/2018 Narrowing of MTP joints and DIP joints was noted.  No erosive changes were noted. 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  Xr Foot 2 Views Right  Result Date: 08/21/2018 Narrowing of MTP joints and DIP joints was noted.  No erosive changes were noted. 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  Xr Hand 2 View Left  Result Date: 08/21/2018 Juxta-articular osteopenia was noted.  First second and third MCP joint narrowing with erosive changes were noted.  PIP and DIP narrowing was noted.  Severe CMC narrowing was noted.  Intercarpal and radiocarpal joint space narrowing was noted.  Possible cystic versus erosive changes were noted in the carpal bones. 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  Xr Hand 2 View Right  Result Date: 08/21/2018 Juxta-articular osteopenia was noted.  Narrowing of first second and third MCP joint was noted.  Erosive changes were noted in the first second and third MCP joints.  Narrowing of all PIP and DIP joints was noted.  Intercarpal radiocarpal joint space narrowing with erosive changes in the carpal bones were noted. 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  Xr Knee 3 View Left  Result Date: 08/21/2018 Moderate to severe lateral compartment narrowing with cystic changes was noted.  No chondrocalcinosis was noted.  No patellofemoral narrowing was noted. 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  Xr Knee 3 View Right  Result Date: 08/21/2018 Moderate lateral compartment narrowing was noted.  No chondrocalcinosis was noted.  Intercondylar osteophytes were noted.  No patellofemoral narrowing was noted.  Effusion was noted in the knee joint. 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   Recent Labs: Lab Results  Component Value Date   WBC 7.4 06/09/2018   HGB 11.9 (L) 06/09/2018   PLT 317 06/09/2018   NA 134 (L) 11/15/2016   K 3.9 11/15/2016   CL 103 11/15/2016   CO2 26 11/15/2016   GLUCOSE 105 (H) 11/15/2016   BUN 21 (H) 11/15/2016   CREATININE 0.93 11/15/2016   BILITOT 0.6 11/15/2016   ALKPHOS 69 11/15/2016   AST 13 (L) 11/15/2016   ALT 16 (L) 11/15/2016   PROT 6.7 11/15/2016   ALBUMIN 3.5 11/15/2016   CALCIUM 8.7 (L) 11/15/2016   GFRAA >60 11/15/2016    Speciality Comments: No specialty comments available.  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  He had synovitis in multiple joints.  The symptoms  have been going on since June 2019.  I will obtain following labs today.  He has severe swelling.  I will give him a prednisone taper starting at 20 mg p.o. daily and taper by 5 mg every week.  Plan: 14-3-3 eta Protein, HLA-B27 antigen, Angiotensin converting enzyme, Uric acid, B. burgdorfi antibodies  Pain in both hands -he has synovitis over bilateral wrist joints and  bilateral hands.  Plan: XR Hand 2 View Right, XR Hand 2 View Left.  X-rays were consistent with severe inflammatory erosive arthritis.  Chronic pain of both knees -he has warmth swelling and effusion in his knee joints.  Effusion is more prominent in the right knee.  Plan: XR KNEE 3 VIEW RIGHT, XR KNEE 3 VIEW LEFT.  The x-ray showed bilateral moderate to severe lateral compartment narrowing.  Pain in both feet -he has pain in ankles and feet.  No synovitis was noted.  He has some osteoarthritic changes in his feet on a clinical examination.  Plan: XR Foot 2 Views Right, XR Foot 2 Views Left.  X-ray showed osteoarthritis and inflammatory arthritis overlap.  [name] syndrome without gangrene -he gives history of rainouts phenomenon which has not been so intense this winter.  All autoimmune work-up so far has been negative.  05/30/18: Sed rate 38, ANA negative, CRP 17, CCP 9, RF<10 - Plan: Beta-2 glycoprotein antibodies, Cardiolipin antibodies, IgG, IgM, IgA, Lupus Anticoagulant Eval w/Reflex  High risk medication use -in anticipation to start on immunosuppressive therapy I will obtain following labs today.  Plan: Hepatitis B core antibody, IgM, Hepatitis B surface antigen, Hepatitis C antibody, QuantiFERON-TB Gold Plus, Serum protein electrophoresis with reflex, IgG, IgA, IgM, Glucose 6 phosphate dehydrogenase.  He had a chest x-ray in October 2017 which was normal.  Fibromyalgia-patient gives history of fibromyalgia syndrome for about 15 years.  He continues to have some generalized pain and muscle pain.  Other fatigue - Plan: COMPLETE  METABOLIC PANEL WITH GFR, Urinalysis, Routine w reflex microscopic, CK, TSH  Fuchs' corneal dystrophy-patient reports he had surgery.  Essential hypertension  History of iron deficiency anemia  History of diverticulitis  Hypercholesteremia  History of multiple allergies - Followed up by Dr. Donneta Romberg  History of sleep apnea - on CPAP   Orders: Orders Placed This Encounter  Procedures  . XR Hand 2 View Right  . XR Hand 2 View Left  . XR KNEE 3 VIEW RIGHT  . XR KNEE 3 VIEW LEFT  . XR Foot 2 Views Right  . XR Foot 2 Views Left  . COMPLETE METABOLIC PANEL WITH GFR  . Urinalysis, Routine w reflex microscopic  . CK  . TSH  . 14-3-3 eta Protein  . HLA-B27 antigen  . Angiotensin converting enzyme  . Uric acid  . Beta-2 glycoprotein antibodies  . Cardiolipin antibodies, IgG, IgM, IgA  . Lupus Anticoagulant Eval w/Reflex  . B. burgdorfi antibodies  . Hepatitis B core antibody, IgM  . Hepatitis B surface antigen  . Hepatitis C antibody  . QuantiFERON-TB Gold Plus  . Serum protein electrophoresis with reflex  . IgG, IgA, IgM  . Glucose 6 phosphate dehydrogenase   Meds ordered this encounter  Medications  . predniSONE (DELTASONE) 5 MG tablet    Sig: Take 4 tablets by mouth daily x7 days, 3 tablets daily x7 days, 2 tablets daily x7 days, 1 tablet daily x7 days.    Dispense:  70 tablet    Refill:  0    Face-to-face time spent with patient was 50 minutes. Greater than 50% of time was spent in counseling and coordination of care.  Follow-Up Instructions: Return for Inflammatory arthritis.   Bo Merino, MD  Note - This record has been created using Editor, commissioning.  Chart creation errors have been sought, but may not always  have been located. Such creation errors do not reflect on  the standard of medical care.

## 2018-08-11 DIAGNOSIS — J3081 Allergic rhinitis due to animal (cat) (dog) hair and dander: Secondary | ICD-10-CM | POA: Diagnosis not present

## 2018-08-11 DIAGNOSIS — J3089 Other allergic rhinitis: Secondary | ICD-10-CM | POA: Diagnosis not present

## 2018-08-11 DIAGNOSIS — J301 Allergic rhinitis due to pollen: Secondary | ICD-10-CM | POA: Diagnosis not present

## 2018-08-20 DIAGNOSIS — J3089 Other allergic rhinitis: Secondary | ICD-10-CM | POA: Diagnosis not present

## 2018-08-20 DIAGNOSIS — J301 Allergic rhinitis due to pollen: Secondary | ICD-10-CM | POA: Diagnosis not present

## 2018-08-20 DIAGNOSIS — J3081 Allergic rhinitis due to animal (cat) (dog) hair and dander: Secondary | ICD-10-CM | POA: Diagnosis not present

## 2018-08-21 ENCOUNTER — Ambulatory Visit (INDEPENDENT_AMBULATORY_CARE_PROVIDER_SITE_OTHER): Payer: Medicare HMO

## 2018-08-21 ENCOUNTER — Ambulatory Visit: Payer: Commercial Managed Care - HMO | Admitting: Rheumatology

## 2018-08-21 ENCOUNTER — Encounter: Payer: Self-pay | Admitting: Rheumatology

## 2018-08-21 VITALS — BP 139/88 | HR 73 | Resp 15 | Ht 71.0 in | Wt 200.8 lb

## 2018-08-21 DIAGNOSIS — M1990 Unspecified osteoarthritis, unspecified site: Secondary | ICD-10-CM

## 2018-08-21 DIAGNOSIS — M79671 Pain in right foot: Secondary | ICD-10-CM

## 2018-08-21 DIAGNOSIS — M79642 Pain in left hand: Secondary | ICD-10-CM | POA: Diagnosis not present

## 2018-08-21 DIAGNOSIS — M79672 Pain in left foot: Secondary | ICD-10-CM | POA: Diagnosis not present

## 2018-08-21 DIAGNOSIS — M79641 Pain in right hand: Secondary | ICD-10-CM

## 2018-08-21 DIAGNOSIS — Z9189 Other specified personal risk factors, not elsewhere classified: Secondary | ICD-10-CM | POA: Insufficient documentation

## 2018-08-21 DIAGNOSIS — R5383 Other fatigue: Secondary | ICD-10-CM | POA: Diagnosis not present

## 2018-08-21 DIAGNOSIS — Z8669 Personal history of other diseases of the nervous system and sense organs: Secondary | ICD-10-CM

## 2018-08-21 DIAGNOSIS — M25562 Pain in left knee: Secondary | ICD-10-CM | POA: Diagnosis not present

## 2018-08-21 DIAGNOSIS — M797 Fibromyalgia: Secondary | ICD-10-CM | POA: Diagnosis not present

## 2018-08-21 DIAGNOSIS — Z8719 Personal history of other diseases of the digestive system: Secondary | ICD-10-CM | POA: Insufficient documentation

## 2018-08-21 DIAGNOSIS — Z79899 Other long term (current) drug therapy: Secondary | ICD-10-CM

## 2018-08-21 DIAGNOSIS — I7300 Raynaud's syndrome without gangrene: Secondary | ICD-10-CM | POA: Diagnosis not present

## 2018-08-21 DIAGNOSIS — Z862 Personal history of diseases of the blood and blood-forming organs and certain disorders involving the immune mechanism: Secondary | ICD-10-CM | POA: Insufficient documentation

## 2018-08-21 DIAGNOSIS — Z9225 Personal history of immunosupression therapy: Secondary | ICD-10-CM | POA: Diagnosis not present

## 2018-08-21 DIAGNOSIS — I10 Essential (primary) hypertension: Secondary | ICD-10-CM

## 2018-08-21 DIAGNOSIS — M25561 Pain in right knee: Secondary | ICD-10-CM

## 2018-08-21 DIAGNOSIS — E7800 Pure hypercholesterolemia, unspecified: Secondary | ICD-10-CM | POA: Insufficient documentation

## 2018-08-21 DIAGNOSIS — H1851 Endothelial corneal dystrophy: Secondary | ICD-10-CM | POA: Diagnosis not present

## 2018-08-21 DIAGNOSIS — Z889 Allergy status to unspecified drugs, medicaments and biological substances status: Secondary | ICD-10-CM | POA: Insufficient documentation

## 2018-08-21 DIAGNOSIS — G8929 Other chronic pain: Secondary | ICD-10-CM

## 2018-08-21 DIAGNOSIS — H18519 Endothelial corneal dystrophy, unspecified eye: Secondary | ICD-10-CM | POA: Insufficient documentation

## 2018-08-21 MED ORDER — PREDNISONE 5 MG PO TABS: ORAL_TABLET | ORAL | 0 refills | Status: DC

## 2018-08-26 LAB — PROTEIN ELECTROPHORESIS, SERUM, WITH REFLEX
Albumin ELP: 3.9 g/dL (ref 3.8–4.8)
Alpha 1: 0.4 g/dL — ABNORMAL HIGH (ref 0.2–0.3)
Alpha 2: 0.8 g/dL (ref 0.5–0.9)
Beta 2: 0.5 g/dL (ref 0.2–0.5)
Beta Globulin: 0.5 g/dL (ref 0.4–0.6)
Gamma Globulin: 1.8 g/dL — ABNORMAL HIGH (ref 0.8–1.7)
Total Protein: 7.9 g/dL (ref 6.1–8.1)

## 2018-08-26 LAB — CK: Total CK: 75 U/L (ref 44–196)

## 2018-08-26 LAB — QUANTIFERON-TB GOLD PLUS
Mitogen-NIL: 4.19 IU/mL
NIL: 0.01 IU/mL
QuantiFERON-TB Gold Plus: NEGATIVE
TB1-NIL: 0 IU/mL
TB2-NIL: 0 IU/mL

## 2018-08-26 LAB — URIC ACID: Uric Acid, Serum: 4.4 mg/dL (ref 4.0–8.0)

## 2018-08-26 LAB — URINALYSIS, ROUTINE W REFLEX MICROSCOPIC
Bilirubin Urine: NEGATIVE
Glucose, UA: NEGATIVE
Hgb urine dipstick: NEGATIVE
Ketones, ur: NEGATIVE
Leukocytes,Ua: NEGATIVE
Nitrite: NEGATIVE
Protein, ur: NEGATIVE
Specific Gravity, Urine: 1.012 (ref 1.001–1.03)
pH: 8 (ref 5.0–8.0)

## 2018-08-26 LAB — COMPLETE METABOLIC PANEL WITH GFR
AG Ratio: 1.1 (calc) (ref 1.0–2.5)
ALT: 14 U/L (ref 9–46)
AST: 14 U/L (ref 10–35)
Albumin: 4.1 g/dL (ref 3.6–5.1)
Alkaline phosphatase (APISO): 93 U/L (ref 35–144)
BUN: 16 mg/dL (ref 7–25)
CO2: 26 mmol/L (ref 20–32)
Calcium: 9.6 mg/dL (ref 8.6–10.3)
Chloride: 96 mmol/L — ABNORMAL LOW (ref 98–110)
Creat: 0.88 mg/dL (ref 0.70–1.18)
GFR, Est African American: 98 mL/min/{1.73_m2} (ref >=60)
GFR, Est Non African American: 85 mL/min/{1.73_m2} (ref >=60)
Globulin: 3.9 g/dL (calc) — ABNORMAL HIGH (ref 1.9–3.7)
Glucose, Bld: 88 mg/dL (ref 65–99)
Potassium: 4.3 mmol/L (ref 3.5–5.3)
Sodium: 130 mmol/L — ABNORMAL LOW (ref 135–146)
Total Bilirubin: 0.4 mg/dL (ref 0.2–1.2)
Total Protein: 8 g/dL (ref 6.1–8.1)

## 2018-08-26 LAB — RFLX DRVVT CONFRIM: DRVVT CONFIRM: NEGATIVE

## 2018-08-26 LAB — LUPUS ANTICOAGULANT EVAL W/ REFLEX
PTT-LA Screen: 58 s — ABNORMAL HIGH (ref <=40)
dRVVT: 46 s — ABNORMAL HIGH (ref <=45)

## 2018-08-26 LAB — 14-3-3 ETA PROTEIN: 14-3-3 eta Protein: <0.2 ng/mL (ref <0.2)

## 2018-08-26 LAB — IGG, IGA, IGM
IgG (Immunoglobin G), Serum: 1832 mg/dL — ABNORMAL HIGH (ref 600–1540)
IgM, Serum: 158 mg/dL (ref 50–300)
Immunoglobulin A: 402 mg/dL — ABNORMAL HIGH (ref 70–320)

## 2018-08-26 LAB — BETA-2 GLYCOPROTEIN ANTIBODIES
Beta-2 Glyco 1 IgA: 18 SAU (ref <=20)
Beta-2 Glyco 1 IgM: >150 SMU — ABNORMAL HIGH (ref <=20)
Beta-2 Glyco I IgG: 12 SGU (ref <=20)

## 2018-08-26 LAB — B. BURGDORFI ANTIBODIES: B burgdorferi Ab IgG+IgM: <0.9 index

## 2018-08-26 LAB — HLA-B27 ANTIGEN: HLA-B27 Antigen: NEGATIVE

## 2018-08-26 LAB — HEPATITIS B CORE ANTIBODY, IGM: Hep B C IgM: NONREACTIVE

## 2018-08-26 LAB — HEPATITIS B SURFACE ANTIGEN: Hepatitis B Surface Ag: NONREACTIVE

## 2018-08-26 LAB — CARDIOLIPIN ANTIBODIES, IGG, IGM, IGA
Anticardiolipin IgA: <11 [APL'U]
Anticardiolipin IgG: <14 [GPL'U]
Anticardiolipin IgM: 21 [MPL'U] — ABNORMAL HIGH

## 2018-08-26 LAB — HEPATITIS C ANTIBODY
Hepatitis C Ab: NONREACTIVE
SIGNAL TO CUT-OFF: 0.05 (ref <1.00)

## 2018-08-26 LAB — TSH: TSH: 1.2 mIU/L (ref 0.40–4.50)

## 2018-08-26 LAB — ANGIOTENSIN CONVERTING ENZYME: Angiotensin-Converting Enzyme: 49 U/L (ref 9–67)

## 2018-08-26 LAB — RFLX HEXAGONAL PHASE CONFIRM: Hexagonal Phase Conf: NEGATIVE

## 2018-08-26 LAB — GLUCOSE 6 PHOSPHATE DEHYDROGENASE: G-6PDH: 20 U/g Hgb (ref 7.0–20.5)

## 2018-08-27 DIAGNOSIS — J301 Allergic rhinitis due to pollen: Secondary | ICD-10-CM | POA: Diagnosis not present

## 2018-08-27 DIAGNOSIS — J3089 Other allergic rhinitis: Secondary | ICD-10-CM | POA: Diagnosis not present

## 2018-08-27 DIAGNOSIS — J3081 Allergic rhinitis due to animal (cat) (dog) hair and dander: Secondary | ICD-10-CM | POA: Diagnosis not present

## 2018-08-27 NOTE — Progress Notes (Signed)
I will discuss results at the follow-up visit.  Sodium is low.  Please forward labs to his PCP.

## 2018-09-01 DIAGNOSIS — J3081 Allergic rhinitis due to animal (cat) (dog) hair and dander: Secondary | ICD-10-CM | POA: Diagnosis not present

## 2018-09-01 DIAGNOSIS — J3089 Other allergic rhinitis: Secondary | ICD-10-CM | POA: Diagnosis not present

## 2018-09-01 DIAGNOSIS — J301 Allergic rhinitis due to pollen: Secondary | ICD-10-CM | POA: Diagnosis not present

## 2018-09-15 DIAGNOSIS — M359 Systemic involvement of connective tissue, unspecified: Secondary | ICD-10-CM | POA: Insufficient documentation

## 2018-09-15 DIAGNOSIS — I7300 Raynaud's syndrome without gangrene: Secondary | ICD-10-CM | POA: Insufficient documentation

## 2018-09-15 NOTE — Progress Notes (Signed)
August 21, 2018 CMP sodium 130, UA negative, CK 75, TSH normal, SPEP negative, IgG elevated, IgA elevated TB Gold negative, hepatitis B-, hepatitis C negative, G6PD normal Lyme titer negative, uric acid 4.4, ACE 49, HLA-B27 negative, 14 3 3  eta negative, lupus anticoagulant negative, beta-2 IgM> 150, anticardiolipin IgM 21 May 08, 2018 CBC normal, ESR 50, ANA negative, RF negative, anti-CCP negative

## 2018-09-16 ENCOUNTER — Telehealth: Payer: Self-pay | Admitting: Rheumatology

## 2018-09-16 ENCOUNTER — Telehealth (INDEPENDENT_AMBULATORY_CARE_PROVIDER_SITE_OTHER): Payer: Medicare HMO | Admitting: Rheumatology

## 2018-09-16 ENCOUNTER — Encounter: Payer: Self-pay | Admitting: Rheumatology

## 2018-09-16 DIAGNOSIS — Z862 Personal history of diseases of the blood and blood-forming organs and certain disorders involving the immune mechanism: Secondary | ICD-10-CM

## 2018-09-16 DIAGNOSIS — I10 Essential (primary) hypertension: Secondary | ICD-10-CM | POA: Diagnosis not present

## 2018-09-16 DIAGNOSIS — M359 Systemic involvement of connective tissue, unspecified: Secondary | ICD-10-CM

## 2018-09-16 DIAGNOSIS — Z8669 Personal history of other diseases of the nervous system and sense organs: Secondary | ICD-10-CM

## 2018-09-16 DIAGNOSIS — Z9189 Other specified personal risk factors, not elsewhere classified: Secondary | ICD-10-CM

## 2018-09-16 DIAGNOSIS — M797 Fibromyalgia: Secondary | ICD-10-CM | POA: Diagnosis not present

## 2018-09-16 DIAGNOSIS — Z8719 Personal history of other diseases of the digestive system: Secondary | ICD-10-CM | POA: Diagnosis not present

## 2018-09-16 DIAGNOSIS — H18519 Endothelial corneal dystrophy, unspecified eye: Secondary | ICD-10-CM

## 2018-09-16 DIAGNOSIS — H1851 Endothelial corneal dystrophy: Secondary | ICD-10-CM

## 2018-09-16 DIAGNOSIS — Z889 Allergy status to unspecified drugs, medicaments and biological substances status: Secondary | ICD-10-CM

## 2018-09-16 DIAGNOSIS — I7300 Raynaud's syndrome without gangrene: Secondary | ICD-10-CM | POA: Diagnosis not present

## 2018-09-16 DIAGNOSIS — E7800 Pure hypercholesterolemia, unspecified: Secondary | ICD-10-CM

## 2018-09-16 DIAGNOSIS — Z79899 Other long term (current) drug therapy: Secondary | ICD-10-CM | POA: Diagnosis not present

## 2018-09-16 MED ORDER — HYDROXYCHLOROQUINE SULFATE 200 MG PO TABS: 200.0000 mg | ORAL_TABLET | Freq: Two times a day (BID) | ORAL | 0 refills | Status: DC

## 2018-09-16 NOTE — Telephone Encounter (Signed)
Spoke with patient who is going to contact the local chain pharmacies close to him to see if they have the PLQ available. Patient will contact the office and we will send the prescription to the one that patient request.

## 2018-09-16 NOTE — Telephone Encounter (Signed)
Last visit: 08/21/18 Next visit: 04/27/19 Labs: 08/21/18   Patient advised prescription sent to the pharmacy and patient advised

## 2018-09-16 NOTE — Progress Notes (Signed)
Virtual Visit via Telephone Note  I connected with Cole Brown on 09/16/18 at 11:45 AM EDT by telephone and verified that I am speaking with the correct person using two identifiers.   I discussed the limitations, risks, security and privacy concerns of performing an evaluation and management service by telephone and the availability of in person appointments. I also discussed with the patient that there may be a patient responsible charge related to this service. The patient expressed understanding and agreed to proceed.  CC: Bilateral wrist joint pain  History of Present Illness: Patient is a 73 year old male with a past medical history of autoimmune disease, [name], and fibromyalgia.  He was started on a prednisone taper starting at 20 mg tapering by 5 mg every 7 days at his initial visit on 08/21/18.  His last dose of prednisone will be tomorrow.  His joint pain and swelling resolved within 3 days after starting the taper.  He reports he is having pain in both wrist joints.  He denies any joint swelling.  He states his symptoms of [name] have been better with warmer weather. His fibromyalgia pain has been manageable.   He is having some discomfort in the left lower extremity.      Review of Systems  Constitutional: Positive for malaise/fatigue. Negative for fever.  HENT:       Reports mouth dryness  Respiratory: Negative for cough, shortness of breath and wheezing.   Cardiovascular: Negative for chest pain and palpitations.  Gastrointestinal: Positive for constipation. Negative for blood in stool and diarrhea.  Genitourinary: Negative for dysuria.  Musculoskeletal: Positive for joint pain. Negative for back pain and myalgias.       + Joint swelling  Skin: Negative for rash.  Neurological: Negative for dizziness and headaches.  Psychiatric/Behavioral: Negative for depression. The patient is not nervous/anxious.    Observations/Objective: Physical Exam  Constitutional: He is  oriented to person, place, and time.  Neurological: He is alert and oriented to person, place, and time.  Psychiatric: Mood, memory, affect and judgment normal.   Patient reports morning stiffness for 0 minutes.   Patient denies nocturnal pain.  Difficulty dressing/grooming: Denies Difficulty climbing stairs: Denies Difficulty getting out of chair: Denies Difficulty using hands for taps, buttons, cutlery, and/or writing: Denies  Lab work from August 21, 2018:  CMP sodium 130, UA negative, CK 75, TSH normal, SPEP negative, IgG elevated, IgA elevated TB Gold negative, hepatitis B-, hepatitis C negative, G6PD normal Lyme titer negative, uric acid 4.4, ACE 49, HLA-B27 negative, 14 3 3  eta negative, lupus anticoagulant negative, beta-2 IgM> 150, anticardiolipin IgM 21 May 08, 2018 CBC normal, ESR 50, ANA negative, RF negative, anti-CCP negative  Assessment and Plan:  Autoimmune disease: ANA negative ENA negative, [name] phenomenon, beta-2 GP 1 IgM positive, anticardiolipin IgM positive, elevated ESR, severe inflammatory arthritis: Lab work obtained on 08/21/18 was reviewed with the patient and all questions were addressed. He will be completing a prednisone taper tomorrow. He was started on Prednisone 20 mg and tapered by 5 mg every 7 days.  He noticed significant improvement in joint pain and joint swelling while being on prednisone.  His symptoms of [name] are improving.  He has no digital ulcerations or signs of gangrene.  He has no other features of autoimmune disease at this time. We discussed the indications, contraindications, and potential side effects of starting on plaquenil.  Due to his history of Fuchs' corneal dystrophy, he would like to discuss starting on  PLQ with his opthamologist.  He will be started on plaquenil 200 mg 1 tablet by mouth twice daily.  We will send in a 90-day supply today. He was recommended to start taking Aspirin 81 mg 1 tablet by mouth daily.  He was advised  to notify us if he develops any side effects.  He will also notify us if he develops any new or worsening symptoms.  He will follow up in 3 months.    Patient was counseled on the purpose, proper use, and adverse effects of hydroxychloroquine including nausea/diarrhea, skin rash, headaches, and sun sensitivity.  Discussed importance of annual eye exams while on hydroxychloroquine to monitor to ocular toxicity and discussed importance of frequent laboratory monitoring.  Provided patient with eye exam form for baseline ophthalmologic exam.  Provided patient with educational materials on hydroxychloroquine and answered all questions.  Patient consented to hydroxychloroquine.  Will upload consent in the media tab.    Dose will be Plaquenil 200 mg twice daily.  We will send consent form to him, which he will complete and send back to our office.   High risk medication use: He will start taking Aspirin 81 mg po daily. Discussed PLQ. He will return for lab work 1 month after starting on PLQ then in 3 months then every 5 months.  We discussed the importance of having a baseline PLQ eye exam.  He will reach out to his ophthalmologist today to determine if they are ok with starting on PLQ.   [name] disease without gangrene: His symptoms of [name] have improved with warmer weather.  He has no digital ulcerations or signs of gangrene. He will notify us if he develops worsening symptoms.  He will start taking Aspirin 81 mg po daily and PLQ 200 mg 1 tablet BID.   Fibromyalgia: His fibromyalgia pain has been manageable recently.  He continues to have chronic fatigue.   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  His recent appointment with his opthalmologist was canceled recently due to the coronavirus pandemic.   Follow Up Instructions: He will follow up in the office in 3 months.   We discussed the indications, contraindications, and  potential side effects of plaquenil.   He will start taking Aspirin 81 mg 1 tablet by mouth daily.  He will return for lab work in 1 month, 3 months, then every 5 months.    I discussed the assessment and treatment plan with the patient. The patient was provided an opportunity to ask questions and all were answered. The patient agreed with the plan and demonstrated an understanding of the instructions.   The patient was advised to call back or seek an in-person evaluation if the symptoms worsen or if the condition fails to improve as anticipated.  I provided 30 minutes of non-face-to-face time during this encounter.  Bo Merino, MD   Scribed by-   Ofilia Neas, PA-C

## 2018-09-16 NOTE — Telephone Encounter (Signed)
Patient left a message on voicemail stating he found Plaquenil at the [facility]. Phone# [phone]

## 2018-09-16 NOTE — Telephone Encounter (Signed)
Patient called stating he spoke with the pharmacist at [facility] and they will not be able to fill his prescription of Plaquenil for at least 2 months.  Patient is requesting a return to let him know if there is another medication that Dr. Estanislado Pandy could recommend.

## 2018-09-16 NOTE — Patient Instructions (Signed)
Cole Brown We placed an order today for your Cole lab work.    Please come back and get your Cole Brown in 1 month after starting plaquenil and then every 3 months.   We have open lab Monday through Friday from 8:30-11:30 AM and 1:30-4:00 PM  at the office of Dr. Bo Merino.   You may experience shorter wait times on Monday and Friday afternoons. The office is located at [address], [place], [place], [place] [address] No appointment is necessary.   Brown are drawn by [facility].  You may receive a bill from [facility] for your lab work.  If you wish to have your Brown drawn at another location, please call the office 24 hours in advance to send orders.  If you have any questions regarding directions or hours of operation,  please call [phone].   Just as a reminder please drink plenty of water prior to coming for your lab work. Thanks!   Hydroxychloroquine tablets What is this medicine? HYDROXYCHLOROQUINE (hye drox ee KLOR oh kwin) is used to treat rheumatoid arthritis and systemic lupus erythematosus. It is also used to treat malaria. This medicine may be used for other purposes; ask your health care provider or pharmacist if you have questions. COMMON BRAND NAME(S): Plaquenil, Quineprox What should I tell my health care provider before I take this medicine? They need to know if you have any of these conditions: -diabetes -eye disease, vision problems -G6PD deficiency -history of blood diseases -history of irregular heartbeat -if you often drink alcohol -kidney disease -liver disease -porphyria -psoriasis -seizures -an unusual or allergic reaction to chloroquine, hydroxychloroquine, other medicines, foods, dyes, or preservatives -pregnant or trying to get pregnant -breast-feeding How should I use this medicine? Take this medicine by mouth with a glass of water. Follow the directions on the prescription label. Avoid taking antacids within 4 hours of taking  this medicine. It is best to separate these medicines by at least 4 hours. Do not cut, crush or chew this medicine. You can take it with or without food. If it upsets your stomach, take it with food. Take your medicine at regular intervals. Do not take your medicine more often than directed. Take all of your medicine as directed even if you think you are better. Do not skip doses or stop your medicine early. Talk to your pediatrician regarding the use of this medicine in children. While this drug may be prescribed for selected conditions, precautions do apply. Overdosage: If you think you have taken too much of this medicine contact a poison control center or emergency room at once. NOTE: This medicine is only for you. Do not share this medicine with others. What if I miss a dose? If you miss a dose, take it as soon as you can. If it is almost time for your next dose, take only that dose. Do not take double or extra doses. What may interact with this medicine? Do not take this medicine with any of the following medications: -cisapride -dofetilide -dronedarone -live virus vaccines -penicillamine -pimozide -thioridazine -ziprasidone This medicine may also interact with the following medications: -ampicillin -antacids -cimetidine -cyclosporine -digoxin -medicines for diabetes, like insulin, glipizide, glyburide -medicines for seizures like carbamazepine, phenobarbital, phenytoin -mefloquine -methotrexate -other medicines that prolong the QT interval (cause an abnormal heart rhythm) -praziquantel This list may not describe all possible interactions. Give your health care provider a list of all the medicines, herbs, non-prescription drugs, or dietary supplements you use. Also tell them if  you smoke, drink alcohol, or use illegal drugs. Some items may interact with your medicine. What should I watch for while using this medicine? Tell your doctor or healthcare professional if your symptoms do  not start to get better or if they get worse. Avoid taking antacids within 4 hours of taking this medicine. It is best to separate these medicines by at least 4 hours. Tell your doctor or health care professional right away if you have any change in your eyesight. Your vision and blood may be tested before and during use of this medicine. This medicine can make you more sensitive to the sun. Keep out of the sun. If you cannot avoid being in the sun, wear protective clothing and use sunscreen. Do not use sun lamps or tanning beds/booths. What side effects may I notice from receiving this medicine? Side effects that you should report to your doctor or health care professional as soon as possible: -allergic reactions like skin rash, itching or hives, swelling of the face, lips, or tongue -changes in vision -decreased hearing or ringing of the ears -redness, blistering, peeling or loosening of the skin, including inside the mouth -seizures -sensitivity to light -signs and symptoms of a dangerous change in heartbeat or heart rhythm like chest pain; dizziness; fast or irregular heartbeat; palpitations; feeling faint or lightheaded, falls; breathing problems -signs and symptoms of liver injury like dark yellow or brown urine; general ill feeling or flu-like symptoms; light-colored stools; loss of appetite; nausea; right upper belly pain; unusually weak or tired; yellowing of the eyes or skin -signs and symptoms of low blood sugar such as feeling anxious; confusion; dizziness; increased hunger; unusually weak or tired; sweating; shakiness; cold; irritable; headache; blurred vision; fast heartbeat; loss of consciousness -uncontrollable head, mouth, neck, arm, or leg movements Side effects that usually do not require medical attention (report to your doctor or health care professional if they continue or are bothersome): -anxious -diarrhea -dizziness -hair loss -headache -irritable -loss of  appetite -nausea, vomiting -stomach pain This list may not describe all possible side effects. Call your doctor for medical advice about side effects. You may report side effects to FDA at 1-800-FDA-1088. Where should I keep my medicine? Keep out of the reach of children. In children, this medicine can cause overdose with small doses. Store at room temperature between 15 and 30 degrees C (59 and 86 degrees F). Protect from moisture and light. Throw away any unused medicine after the expiration date. NOTE: This sheet is a summary. It may not cover all possible information. If you have questions about this medicine, talk to your doctor, pharmacist, or health care provider.  2019 Elsevier/Gold Standard (2016-01-18 14:16:15)

## 2018-09-18 ENCOUNTER — Telehealth: Payer: Self-pay | Admitting: Rheumatology

## 2018-09-18 NOTE — Telephone Encounter (Signed)
Copy of office note sent to patient.

## 2018-09-18 NOTE — Telephone Encounter (Signed)
Called patient to schedule 3 month follow-up.  Patient states his virtual visit went well, but "he is a little unfamiliar with the terms that were used."  Patient is requesting a copy of what was discussed at his virtual appointment be mailed to:  [address] [place], [place] [address]

## 2018-09-20 ENCOUNTER — Other Ambulatory Visit: Payer: Self-pay | Admitting: Cardiology

## 2018-09-23 ENCOUNTER — Other Ambulatory Visit (INDEPENDENT_AMBULATORY_CARE_PROVIDER_SITE_OTHER): Payer: Self-pay | Admitting: *Deleted

## 2018-09-23 ENCOUNTER — Encounter (INDEPENDENT_AMBULATORY_CARE_PROVIDER_SITE_OTHER): Payer: Self-pay | Admitting: *Deleted

## 2018-09-23 DIAGNOSIS — Z8719 Personal history of other diseases of the digestive system: Secondary | ICD-10-CM

## 2018-09-29 DIAGNOSIS — Z8719 Personal history of other diseases of the digestive system: Secondary | ICD-10-CM | POA: Diagnosis not present

## 2018-09-29 LAB — HEMOGLOBIN AND HEMATOCRIT, BLOOD
HCT: 35.9 % — ABNORMAL LOW (ref 38.5–50.0)
Hemoglobin: 11.8 g/dL — ABNORMAL LOW (ref 13.2–17.1)

## 2018-09-30 ENCOUNTER — Telehealth: Payer: Self-pay | Admitting: Rheumatology

## 2018-09-30 NOTE — Telephone Encounter (Signed)
Patient called stating approximately 3-4 days after he began taking Plaquenil at the end of March he started developing a rash on his inner thighs and scrotum.  Patient states he started to eliminate things to find out what was causing the rash and has come to the conclusion it was the Plaquenil.  Patient states he wanted Dr. Estanislado Pandy to know that he has decided to stop taking the medication at this time.  Patient was offered a virtual office visit to discuss his symptoms, but declined.

## 2018-10-08 DIAGNOSIS — L309 Dermatitis, unspecified: Secondary | ICD-10-CM | POA: Diagnosis not present

## 2018-10-22 DIAGNOSIS — Z0100 Encounter for examination of eyes and vision without abnormal findings: Secondary | ICD-10-CM | POA: Diagnosis not present

## 2018-10-22 DIAGNOSIS — H5200 Hypermetropia, unspecified eye: Secondary | ICD-10-CM | POA: Diagnosis not present

## 2018-10-29 ENCOUNTER — Telehealth: Payer: Self-pay | Admitting: Rheumatology

## 2018-10-29 NOTE — Telephone Encounter (Signed)
Patient states e had a reaction to the PLQ immediately after taking the PLQ.  Patient states the rash has gone away for the most part. Patient states he has had a lot of allergic reactions. Patient has been scheduled to discuss treatment options 11/06/18.

## 2018-10-29 NOTE — Telephone Encounter (Signed)
Patient called stating he began taking his Plaquenil on Sunday night and again on Monday morning.  Patient states his rash started to develop almost immediately so he discontinued the medication.  Patient is requesting a return call.

## 2018-10-30 NOTE — Progress Notes (Addendum)
Office Visit Note  Patient: Cole Brown             Date of Birth: Oct 17, 1945           MRN: [id number]             PCP: Redmond School, MD Referring: Redmond School, MD Visit Date: 11/06/2018 Occupation: _0 @  Subjective:  Discuss treatment options for joint pain and joint swelling  History of Present Illness: Cole Brown is a 73 y.o. male with history of autoimmune disease, [name] disease, and fibromyalgia.  He was started on Plaquenil in April 2020 but discontinued due to developing a rash.  He states that the rash was on his scrotum and face.  He states that the rash has resolved since discontinuing Plaquenil.  He states that he no significant improvement on the prednisone taper.  He states that he is having pain in bilateral wrist joints and notices intermittent swelling.  He states he has been experiencing worsening symptoms of [name] but denies any digital ulcerations or signs of gangrene.  He continues have generalized muscle aches muscle tenderness due to fibromyalgia.  He has intermittent oral ulcerations but no nasal ulcerations.  He experiences mouth dryness but no eye dryness.  His level of fatigue has been stable.  He denies any fevers or swollen lymph nodes.   Activities of Daily Living:  Patient reports morning stiffness for 0  minutes.   Patient Denies nocturnal pain.  Difficulty dressing/grooming: Denies Difficulty climbing stairs: Denies Difficulty getting out of chair: Denies Difficulty using hands for taps, buttons, cutlery, and/or writing: Reports  Review of Systems  Constitutional: Positive for fatigue. Negative for night sweats.  HENT: Positive for mouth sores and mouth dryness. Negative for nose dryness.   Eyes: Negative for redness, visual disturbance and dryness.  Respiratory: Negative for cough, hemoptysis, shortness of breath and difficulty breathing.   Cardiovascular: Negative for chest pain, palpitations, hypertension, irregular  heartbeat and swelling in legs/feet.  Gastrointestinal: Negative for blood in stool, constipation and diarrhea.  Endocrine: Negative for increased urination.  Genitourinary: Negative for painful urination.  Musculoskeletal: Positive for arthralgias, joint pain, myalgias, muscle tenderness and myalgias. Negative for joint swelling, muscle weakness and morning stiffness.  Skin: Positive for color change and rash. Negative for hair loss, nodules/bumps, skin tightness, ulcers and sensitivity to sunlight.  Allergic/Immunologic: Negative for susceptible to infections.  Neurological: Negative for dizziness, fainting, memory loss, night sweats and weakness.  Hematological: Negative for swollen glands.  Psychiatric/Behavioral: Negative for depressed mood and sleep disturbance. The patient is not nervous/anxious.     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   Fibromyalgia 08/21/2018   History of multiple allergies 08/21/2018   History of sleep apnea 08/21/2018   Rectal bleed 11/14/2016   Essential hypertension 11/14/2016    Past Medical History:  Diagnosis Date   Diverticula of colon    2016 colonoscopy   Fibromyalgia    Fibromyalgia    History of GI diverticular bleed    Hypercholesteremia    Hypertension     Family History  Problem Relation Age of Onset   Heart disease Mother    Pancreatic cancer Mother    Prostate cancer Father    Healthy Sister  Parkinson's disease Brother    Asthma Sister    Healthy Sister    Past Surgical History:  Procedure Laterality Date   allergy shots  weekly   CHOLECYSTECTOMY     COLONOSCOPY     Dr.Rehman   COLONOSCOPY N/A 01/27/2015   Procedure:  COLONOSCOPY;  Surgeon: Rogene Houston, MD;  Location: [facility];  Service: Endoscopy;  Laterality: N/A;  930   EYE SURGERY Bilateral    partial cornea transplants    GALLBLADDER SURGERY  12/1993   UPPER GASTROINTESTINAL ENDOSCOPY     Social History   Social History Narrative   Not on file    There is no immunization history on file for this patient.   Objective: Vital Signs: BP (!) 147/88 (BP Location: Left Arm, Patient Position: Sitting, Cuff Size: Normal)    Pulse (!) 57    Resp 14    Ht _0  (1.803 m)    Wt 208 lb (94.3 kg)    BMI 29.01 kg/m    Physical Exam Vitals signs and nursing note reviewed.  Constitutional:      Appearance: He is well-developed.  HENT:     Head: Normocephalic and atraumatic.  Eyes:     Conjunctiva/sclera: Conjunctivae normal.     Pupils: Pupils are equal, round, and reactive to light.  Neck:     Musculoskeletal: Normal range of motion and neck supple.  Cardiovascular:     Rate and Rhythm: Normal rate and regular rhythm.     Heart sounds: Normal heart sounds.  Pulmonary:     Effort: Pulmonary effort is normal.     Breath sounds: Normal breath sounds.  Abdominal:     General: Bowel sounds are normal.     Palpations: Abdomen is soft.  Lymphadenopathy:     Cervical: No cervical adenopathy.  Skin:    General: Skin is warm and dry.     Capillary Refill: Capillary refill takes less than 2 seconds.  Neurological:     Mental Status: He is alert and oriented to person, place, and time.  Psychiatric:        Behavior: Behavior normal.      Musculoskeletal Exam: C-spine, thoracic spine, and lumbar spine good ROM.  No midline spinal tenderness or SI joint tenderness. Shoulder joints have good ROM with no discomfort. Synovitis and tenderness over both wrist joints.  He is synovitis and tenderness of the right second and third MCP joints and third PIP joint.  He has left second MCP joint synovitis and second and third PIP joint synovitis.  Hip  joints have good range of motion with no discomfort.  Knee joints have good range of motion with no warmth or effusion.  No tenderness or swelling of ankle joints.  CDAI Exam: CDAI Score: Not documented Patient Global Assessment: Not documented; Provider Global Assessment: Not documented Swollen: 8 ; Tender: 8  Joint Exam      Right  Left  Wrist  Swollen Tender  Swollen Tender  MCP 2  Swollen Tender  Swollen Tender  MCP 3  Swollen Tender     PIP 2     Swollen Tender  PIP 3  Swollen Tender  Swollen Tender     Investigation: No additional findings.  Imaging: No results found.  Recent Labs: Lab Results  Component Value Date   WBC 7.4 06/09/2018   HGB 11.8 (L) 09/29/2018   PLT 317 06/09/2018   NA 130 (L) 08/21/2018   K 4.3 08/21/2018  CL 96 (L) 08/21/2018   CO2 26 08/21/2018   GLUCOSE 88 08/21/2018   BUN 16 08/21/2018   CREATININE 0.88 08/21/2018   BILITOT 0.4 08/21/2018   ALKPHOS 69 11/15/2016   AST 14 08/21/2018   ALT 14 08/21/2018   PROT 8.0 08/21/2018   PROT 7.9 08/21/2018   ALBUMIN 3.5 11/15/2016   CALCIUM 9.6 08/21/2018   GFRAA 98 08/21/2018   QFTBGOLDPLUS NEGATIVE 08/21/2018    Speciality Comments: No specialty comments available.  Procedures:  No procedures performed Allergies: Plaquenil [hydroxychloroquine sulfate] and Lodine [etodolac]   Assessment / Plan:     Visit Diagnoses: Autoimmune disease ([place]) - ANA negative ENA negative, [name] phenomenon, beta-2 GP 1 IgM positive, anticardiolipin IgM positive, elevated ESR, severe inflammatory arthritis: He has active synovitis on exam as described above.  He was started on Plaquenil twice daily in April 2020 but discontinued after developing a rash.  He has been experiencing worsening symptoms of [name] recently.  No digital ulcerations or signs of gangrene were noted.  He has recurrent oral ulcerations but no nasal ulcerations.  He has occasional mouth dryness but no eye dryness.  No facial rashes were  noted.  He does not experience any symptoms of photosensitivity.  His level of fatigue has been stable.  He has not had any recent fevers or swollen lymph nodes.  No shortness of breath or palpitations.  We discussed starting him on methotrexate 6 tablets by mouth once weekly and if labs are stable in 2 weeks he will increase to 8 tablets by mouth once weekly.  We will take folic acid 2 mg by mouth daily.  Indications, contraindications, potential side effects of methotrexate were discussed.  All questions were addressed and consent was obtained.  We discussed the frequency of lab work that he will require.  He was advised to notify us if he cannot tolerate taking methotrexate.  He was advised to notify us if he develops increased joint pain or joint swelling.  He will follow-up in the office in 3 months.  High risk medication use -he is started on Plaquenil 200 mg 1 tablet twice daily in April 2020 but discontinued after 1 week to developing a rash.  Rash has resolved since discontinuing Plaquenil.  He will be starting on methotrexate 6 tablets by mouth once weekly and if labs are stable in 2 weeks he will increase to 8 tablets by mouth once weekly.  He will take folic acid 2 mg by mouth daily.  [name] disease without gangrene: He has been experiencing worsening symptoms of [name] in his fingertips.  No digital ulcerations or signs of gangrene were noted.  He was encouraged to keep his current body temperature warm and wear gloves as needed.  He was advised to notify us develops signs of ulcerations  Fibromyalgia: He continues to have generalized muscle aches and muscle tenderness due to fibromyalgia.  He has chronic fatigue related to insomnia.  He is encouraged to continue stay active and exercise on a regular basis.  Other medical conditions are listed as follows:  Essential hypertension  History of diverticulitis  History of iron deficiency anemia  History of multiple allergies  History  of sleep apnea  Hypercholesteremia  Fuchs' corneal dystrophy   Orders: No orders of the defined types were placed in this encounter.  Meds ordered this encounter  Medications   methotrexate (RHEUMATREX) 2.5 MG tablet    Sig: Take 6 tablets weekly for two weeks then increase to 8 tablets  weekly as tolerated. Caution:Chemotherapy. Protect from light.    Dispense:  28 tablet    Refill:  0   folic acid (FOLVITE) 1 MG tablet    Sig: Take 2 tablets (2 mg total) by mouth daily.    Dispense:  180 tablet    Refill:  3    Face-to-face time spent with patient was 30 minutes. Greater than 50% of time was spent in counseling and coordination of care.  Follow-Up Instructions: Return in about 3 months (around 02/06/2019) for Autoimmune Disease.  Hazel Sams PA-C  I examined and evaluated the patient with Hazel Sams PA.  Patient had intolerance to Plaquenil as he developed a rash.  He was given another prednisone taper recently due to increased joint pain and joint swelling.  We had detailed discussion regarding different treatment options today.  After discussing indications side effects contraindications he was in agreement to proceed with methotrexate.  The plan of care was discussed as noted above.  Bo Merino, MD  Note - This record has been created using Editor, commissioning.  Chart creation errors have been sought, but may not always  have been located. Such creation errors do not reflect on  the standard of medical care.

## 2018-11-05 DIAGNOSIS — L304 Erythema intertrigo: Secondary | ICD-10-CM | POA: Diagnosis not present

## 2018-11-05 DIAGNOSIS — Z681 Body mass index (BMI) 19 or less, adult: Secondary | ICD-10-CM | POA: Diagnosis not present

## 2018-11-05 DIAGNOSIS — I87319 Chronic venous hypertension (idiopathic) with ulcer of unspecified lower extremity: Secondary | ICD-10-CM | POA: Diagnosis not present

## 2018-11-05 DIAGNOSIS — Z0000 Encounter for general adult medical examination without abnormal findings: Secondary | ICD-10-CM | POA: Diagnosis not present

## 2018-11-05 DIAGNOSIS — Z1389 Encounter for screening for other disorder: Secondary | ICD-10-CM | POA: Diagnosis not present

## 2018-11-05 DIAGNOSIS — I7300 Raynaud's syndrome without gangrene: Secondary | ICD-10-CM | POA: Diagnosis not present

## 2018-11-05 DIAGNOSIS — E7849 Other hyperlipidemia: Secondary | ICD-10-CM | POA: Diagnosis not present

## 2018-11-06 ENCOUNTER — Other Ambulatory Visit: Payer: Self-pay

## 2018-11-06 ENCOUNTER — Ambulatory Visit (INDEPENDENT_AMBULATORY_CARE_PROVIDER_SITE_OTHER): Payer: Medicare HMO | Admitting: Rheumatology

## 2018-11-06 ENCOUNTER — Encounter: Payer: Self-pay | Admitting: Rheumatology

## 2018-11-06 VITALS — BP 147/88 | HR 57 | Resp 14 | Ht 71.0 in | Wt 208.0 lb

## 2018-11-06 DIAGNOSIS — I7300 Raynaud's syndrome without gangrene: Secondary | ICD-10-CM

## 2018-11-06 DIAGNOSIS — Z9189 Other specified personal risk factors, not elsewhere classified: Secondary | ICD-10-CM | POA: Diagnosis not present

## 2018-11-06 DIAGNOSIS — E7800 Pure hypercholesterolemia, unspecified: Secondary | ICD-10-CM

## 2018-11-06 DIAGNOSIS — Z8719 Personal history of other diseases of the digestive system: Secondary | ICD-10-CM | POA: Diagnosis not present

## 2018-11-06 DIAGNOSIS — M359 Systemic involvement of connective tissue, unspecified: Secondary | ICD-10-CM

## 2018-11-06 DIAGNOSIS — Z79899 Other long term (current) drug therapy: Secondary | ICD-10-CM

## 2018-11-06 DIAGNOSIS — M797 Fibromyalgia: Secondary | ICD-10-CM | POA: Diagnosis not present

## 2018-11-06 DIAGNOSIS — I10 Essential (primary) hypertension: Secondary | ICD-10-CM

## 2018-11-06 DIAGNOSIS — H1851 Endothelial corneal dystrophy: Secondary | ICD-10-CM

## 2018-11-06 DIAGNOSIS — Z8669 Personal history of other diseases of the nervous system and sense organs: Secondary | ICD-10-CM | POA: Diagnosis not present

## 2018-11-06 DIAGNOSIS — H18519 Endothelial corneal dystrophy, unspecified eye: Secondary | ICD-10-CM

## 2018-11-06 DIAGNOSIS — Z889 Allergy status to unspecified drugs, medicaments and biological substances status: Secondary | ICD-10-CM

## 2018-11-06 DIAGNOSIS — Z862 Personal history of diseases of the blood and blood-forming organs and certain disorders involving the immune mechanism: Secondary | ICD-10-CM | POA: Diagnosis not present

## 2018-11-06 MED ORDER — FOLIC ACID 1 MG PO TABS: 2.0000 mg | ORAL_TABLET | Freq: Every day | ORAL | 3 refills | Status: DC

## 2018-11-06 MED ORDER — METHOTREXATE 2.5 MG PO TABS: ORAL_TABLET | ORAL | 0 refills | Status: DC

## 2018-11-06 NOTE — Progress Notes (Signed)
Pharmacy Note  Subjective: Patient presents today to the [facility] to see Dr. Estanislado Pandy.  Patient seen by the pharmacist for counseling on methotrexate for autoimmune disease. He was taking Plaquenil but discontinued this visit as he developed a rash.  Objective: CBC    Component Value Date/Time   WBC 7.4 06/09/2018 0913   RBC 4.30 06/09/2018 0913   HGB 11.8 (L) 09/29/2018 1021   HGB 12.7 (L) 12/21/2016 1150   HCT 35.9 (L) 09/29/2018 1021   HCT 37.7 12/21/2016 1150   PLT 317 06/09/2018 0913   MCV 82.6 06/09/2018 0913   MCV 85 12/21/2016 1150   MCH 27.7 06/09/2018 0913   MCHC 33.5 06/09/2018 0913   RDW 13.6 06/09/2018 0913   RDW 14.9 12/21/2016 1150   LYMPHSABS 1.3 12/21/2016 1150   MONOABS 980 (H) 11/28/2016 0906   EOSABS 0.1 12/21/2016 1150   BASOSABS 0.0 12/21/2016 1150    CMP     Component Value Date/Time   NA 130 (L) 08/21/2018 1114   K 4.3 08/21/2018 1114   CL 96 (L) 08/21/2018 1114   CO2 26 08/21/2018 1114   GLUCOSE 88 08/21/2018 1114   BUN 16 08/21/2018 1114   CREATININE 0.88 08/21/2018 1114   CALCIUM 9.6 08/21/2018 1114   PROT 8.0 08/21/2018 1114   PROT 7.9 08/21/2018 1114   ALBUMIN 3.5 11/15/2016 0628   AST 14 08/21/2018 1114   ALT 14 08/21/2018 1114   ALKPHOS 69 11/15/2016 0628   BILITOT 0.4 08/21/2018 1114   GFRNONAA 85 08/21/2018 1114   GFRAA 98 08/21/2018 1114    Baseline Immunosuppressant Therapy Labs Quantiferon TB Gold Latest Ref Rng & Units 08/21/2018  Quantiferon TB Gold Plus NEGATIVE NEGATIVE    Hepatitis Latest Ref Rng & Units 08/21/2018  Hep B Surface Ag NON-REACTI NON-REACTIVE  Hep B IgM NON-REACTI NON-REACTIVE  Hep C Ab NON-REACTI NON-REACTIVE  Hep C Ab NON-REACTI NON-REACTIVE    No results found for: HIV  Immunoglobulin Electrophoresis Latest Ref Rng & Units 08/21/2018  IgA  70 - 320 mg/dL 402(H)  IgG 600 - 1,540 mg/dL 1,832(H)  IgM 50 - 300 mg/dL 158    Serum Protein Electrophoresis Latest Ref Rng & Units  08/21/2018  Total Protein 6.1 - 8.1 g/dL 7.9  Albumin 3.8 - 4.8 g/dL 3.9  Alpha-1 0.2 - 0.3 g/dL 0.4(H)  Alpha-2 0.5 - 0.9 g/dL 0.8  Beta Globulin 0.4 - 0.6 g/dL 0.5  Beta 2 0.2 - 0.5 g/dL 0.5  Gamma Globulin 0.8 - 1.7 g/dL 1.8(H)    Lab Results  Component Value Date   G6PDH 20.0 08/21/2018    No results found for: TPMT   Chest-xray:  No active cardiopulmonary  Alcohol use: N/A  Assessment/Plan:   Patient was counseled on the purpose, proper use, and adverse effects of methotrexate including nausea, infection, and signs and symptoms of pneumonitis. Discussed that there is the possibility of an increased risk of malignancy, specifically lymphomas, but it is not well understood if this increased risk is due to the medication or the disease state.  Instructed patient that medication should be held for infection and prior to surgery.  Advised patient to avoid live vaccines. Recommend annual influenza, Pneumovax 23, Prevnar 13, and Shingrix as indicated.   Reviewed instructions with patient to take methotrexate weekly along with folic acid daily.  Discussed the importance of frequent monitoring of kidney and liver function and blood counts, and provided patient with standing lab instructions.  Counseled patient to  avoid NSAIDs and alcohol while on methotrexate.  Provided patient with educational materials on methotrexate and answered all questions.   Patient voiced understanding.  Patient consented to methotrexate use.  Will upload into chart.    Dose of methotrexate will be 6 tablets weekly for two weeks then increase to 8 tablets weekly as tolerated along with folic acid 2 mg daily. Prescription sent to [facility] per patient request.  All questions encouraged and answered.  Instructed patient to call with any further questions or concerns.  Mariella Saa, PharmD, [facility] Clinical Pharmacist  11/06/2018 2:11 PM

## 2018-11-06 NOTE — Patient Instructions (Addendum)
Standing Labs We placed an order today for your standing lab work.    Please come back and get your standing labs in 2 weeks, 4 weeks, 8 weeks, then every 3 months.  We have open lab Monday through Friday from 8:30-11:30 AM and 1:30-4:00 PM  at the office of Dr. Bo Merino.   You may experience shorter wait times on Monday and Friday afternoons. The office is located at [address] [place], [place], [place] [address] No appointment is necessary.   Labs are drawn by [facility].  You may receive a bill from [facility] for your lab work.  If you wish to have your labs drawn at another location, please call the office 24 hours in advance to send orders.  If you have any questions regarding directions or hours of operation,  please call [phone].   Just as a reminder please drink plenty of water prior to coming for your lab work. Thanks!  Vaccines You are taking a medication(s) that can suppress your immune system.  The following immunizations are recommended: . Flu annually . Pneumonia (Pneumovax 23 and Prevnar 13 spaced at least 1 year apart) . Shingrix  Please check with your PCP to make sure you are up to date.  Methotrexate tablets What is this medicine? METHOTREXATE (METH oh TREX ate) is a chemotherapy drug used to treat cancer including breast cancer, leukemia, and lymphoma. This medicine can also be used to treat psoriasis and certain kinds of arthritis. This medicine may be used for other purposes; ask your health care provider or pharmacist if you have questions. COMMON BRAND NAME(S): Rheumatrex, Trexall What should I tell my health care provider before I take this medicine? They need to know if you have any of these conditions: -fluid in the stomach area or lungs -if you often drink alcohol -infection or immune system problems -kidney disease or on hemodialysis -liver disease -low blood counts, like low white cell, platelet, or red cell counts -lung  disease -radiation therapy -stomach ulcers -ulcerative colitis -an unusual or allergic reaction to methotrexate, other medicines, foods, dyes, or preservatives -pregnant or trying to get pregnant -breast-feeding How should I use this medicine? Take this medicine by mouth with a glass of water. Follow the directions on the prescription label. Take your medicine at regular intervals. Do not take it more often than directed. Do not stop taking except on your doctor's advice. Make sure you know why you are taking this medicine and how often you should take it. If this medicine is used for a condition that is not cancer, like arthritis or psoriasis, it should be taken weekly, NOT daily. Taking this medicine more often than directed can cause serious side effects, even death. Talk to your healthcare provider about safe handling and disposal of this medicine. You may need to take special precautions. Talk to your pediatrician regarding the use of this medicine in children. While this drug may be prescribed for selected conditions, precautions do apply. Overdosage: If you think you have taken too much of this medicine contact a poison control center or emergency room at once. NOTE: This medicine is only for you. Do not share this medicine with others. What if I miss a dose? If you miss a dose, talk with your doctor or health care professional. Do not take double or extra doses. What may interact with this medicine? This medicine may interact with the following medication: -acitretin -aspirin and aspirin-like medicines including salicylates -azathioprine -certain antibiotics like penicillins, tetracycline,  and chloramphenicol -cyclosporine -gold -hydroxychloroquine -live virus vaccines -NSAIDs, medicines for pain and inflammation, like ibuprofen or naproxen -other cytotoxic agents -penicillamine -phenylbutazone -phenytoin -probenecid -retinoids such as isotretinoin and tretinoin -steroid  medicines like prednisone or cortisone -sulfonamides like sulfasalazine and trimethoprim/sulfamethoxazole -theophylline This list may not describe all possible interactions. Give your health care provider a list of all the medicines, herbs, non-prescription drugs, or dietary supplements you use. Also tell them if you smoke, drink alcohol, or use illegal drugs. Some items may interact with your medicine. What should I watch for while using this medicine? Avoid alcoholic drinks. This medicine can make you more sensitive to the sun. Keep out of the sun. If you cannot avoid being in the sun, wear protective clothing and use sunscreen. Do not use sun lamps or tanning beds/booths. You may need blood work done while you are taking this medicine. Call your doctor or health care professional for advice if you get a fever, chills or sore throat, or other symptoms of a cold or flu. Do not treat yourself. This drug decreases your body's ability to fight infections. Try to avoid being around people who are sick. This medicine may increase your risk to bruise or bleed. Call your doctor or health care professional if you notice any unusual bleeding. Check with your doctor or health care professional if you get an attack of severe diarrhea, nausea and vomiting, or if you sweat a lot. The loss of too much body fluid can make it dangerous for you to take this medicine. Talk to your doctor about your risk of cancer. You may be more at risk for certain types of cancers if you take this medicine. Both men and women must use effective birth control with this medicine. Do not become pregnant while taking this medicine or until at least 1 normal menstrual cycle has occurred after stopping it. Women should inform their doctor if they wish to become pregnant or think they might be pregnant. Men should not father a child while taking this medicine and for 3 months after stopping it. There is a potential for serious side effects to  an unborn child. Talk to your health care professional or pharmacist for more information. Do not breast-feed an infant while taking this medicine. What side effects may I notice from receiving this medicine? Side effects that you should report to your doctor or health care professional as soon as possible: -allergic reactions like skin rash, itching or hives, swelling of the face, lips, or tongue -breathing problems or shortness of breath -diarrhea -dry, nonproductive cough -low blood counts - this medicine may decrease the number of white blood cells, red blood cells and platelets. You may be at increased risk for infections and bleeding. -mouth sores -redness, blistering, peeling or loosening of the skin, including inside the mouth -signs of infection - fever or chills, cough, sore throat, pain or trouble passing urine -signs and symptoms of bleeding such as bloody or black, tarry stools; red or dark-brown urine; spitting up blood or brown material that looks like coffee grounds; red spots on the skin; unusual bruising or bleeding from the eye, gums, or nose -signs and symptoms of kidney injury like trouble passing urine or change in the amount of urine -signs and symptoms of liver injury like dark yellow or brown urine; general ill feeling or flu-like symptoms; light-colored stools; loss of appetite; nausea; right upper belly pain; unusually weak or tired; yellowing of the eyes or skin Side effects that usually  do not require medical attention (report to your doctor or health care professional if they continue or are bothersome): -dizziness -hair loss -tiredness -upset stomach -vomiting This list may not describe all possible side effects. Call your doctor for medical advice about side effects. You may report side effects to FDA at 1-800-FDA-1088. Where should I keep my medicine? Keep out of the reach of children. Store at room temperature between 20 and 25 degrees C (68 and 77 degrees F).  Protect from light. Throw away any unused medicine after the expiration date. NOTE: This sheet is a summary. It may not cover all possible information. If you have questions about this medicine, talk to your doctor, pharmacist, or health care provider.  2019 Elsevier/Gold Standard (2017-01-24 13:38:43)

## 2018-11-07 ENCOUNTER — Telehealth: Payer: Self-pay | Admitting: Rheumatology

## 2018-11-07 DIAGNOSIS — K219 Gastro-esophageal reflux disease without esophagitis: Secondary | ICD-10-CM | POA: Diagnosis not present

## 2018-11-07 DIAGNOSIS — Z79899 Other long term (current) drug therapy: Secondary | ICD-10-CM | POA: Diagnosis not present

## 2018-11-07 DIAGNOSIS — E782 Mixed hyperlipidemia: Secondary | ICD-10-CM | POA: Diagnosis not present

## 2018-11-07 DIAGNOSIS — E291 Testicular hypofunction: Secondary | ICD-10-CM | POA: Diagnosis not present

## 2018-11-07 DIAGNOSIS — M797 Fibromyalgia: Secondary | ICD-10-CM | POA: Diagnosis not present

## 2018-11-07 DIAGNOSIS — E7849 Other hyperlipidemia: Secondary | ICD-10-CM | POA: Diagnosis not present

## 2018-11-07 NOTE — Telephone Encounter (Signed)
Patient called stating he had an appointment yesterday and has the following questions:  1.  Confirm that he should stop taking Vitamin D.  2.  Should I continue taking a low dose aspirin.  3.  Question on how he takes Atenolol.  Patient requested a return call.

## 2018-11-07 NOTE — Telephone Encounter (Signed)
Patient would like to know if he is to continue to take vitamin D with the MTX. Patient would like to make sure there is no contraindication. Patient states on his after visit summary there was a note a the atenlol about a change. Advised patient that we would not make changes to medications we do not prescribe. Patient states he has found in his after visit summary advising him to continue his Asprin daily.    Per [facility] pharmacist there are nor contraindications for Vitamin D and MTX. Patient advised.

## 2018-11-17 DIAGNOSIS — Z7952 Long term (current) use of systemic steroids: Secondary | ICD-10-CM | POA: Diagnosis not present

## 2018-11-17 DIAGNOSIS — Z961 Presence of intraocular lens: Secondary | ICD-10-CM | POA: Diagnosis not present

## 2018-11-17 DIAGNOSIS — Z9842 Cataract extraction status, left eye: Secondary | ICD-10-CM | POA: Diagnosis not present

## 2018-11-17 DIAGNOSIS — Z9841 Cataract extraction status, right eye: Secondary | ICD-10-CM | POA: Diagnosis not present

## 2018-11-17 DIAGNOSIS — Z947 Corneal transplant status: Secondary | ICD-10-CM | POA: Diagnosis not present

## 2018-11-21 ENCOUNTER — Telehealth: Payer: Self-pay | Admitting: Rheumatology

## 2018-11-21 NOTE — Telephone Encounter (Signed)
Discussed with Dr. Estanislado Pandy.  She would like the patient to follow up with PCP for evaluation of sore throat and localized rash.  Methotrexate usually causes a diffuse generalized rash.  He should hold MTX until he is evaluated by PCP and infection has been ruled out.

## 2018-11-21 NOTE — Telephone Encounter (Signed)
Patient states he has been on the MTX for about 2 weeks. Patient thinks he may be having a reaction to the medication. Patient states he is having a rash on the left ankle red and blotchy goes 6-7 inches up. Patient states he has not noticed it anywhere else and states that it started 11/19/2018. Patient states he takes Xyzal regularly and has taken Benadryl since noticing the rash and has not noticed a difference in the rash. Patient states he is also having a sore throat that has been coming and going since 11/18/18. Please advise.

## 2018-11-21 NOTE — Telephone Encounter (Signed)
Patient taking MTX for 2 full weeks now. Patient experiencing a rash on his lt ankle on the inside. Patient also feels like he is getting a sore throat due to medication. Please call to advise.

## 2018-11-21 NOTE — Telephone Encounter (Signed)
Patient advised discussed with Dr. Estanislado Pandy.  She would like the patient to follow up with PCP for evaluation of sore throat and localized rash.  Methotrexate usually causes a diffuse generalized rash.  He should hold MTX until he is evaluated by PCP and infection has been ruled out. Patient verbalized understanding.

## 2018-11-24 ENCOUNTER — Telehealth: Payer: Self-pay | Admitting: Rheumatology

## 2018-11-24 DIAGNOSIS — E782 Mixed hyperlipidemia: Secondary | ICD-10-CM | POA: Diagnosis not present

## 2018-11-24 DIAGNOSIS — R58 Hemorrhage, not elsewhere classified: Secondary | ICD-10-CM | POA: Diagnosis not present

## 2018-11-24 DIAGNOSIS — M797 Fibromyalgia: Secondary | ICD-10-CM | POA: Diagnosis not present

## 2018-11-24 DIAGNOSIS — E291 Testicular hypofunction: Secondary | ICD-10-CM | POA: Diagnosis not present

## 2018-11-24 DIAGNOSIS — I7300 Raynaud's syndrome without gangrene: Secondary | ICD-10-CM | POA: Diagnosis not present

## 2018-11-24 DIAGNOSIS — E871 Hypo-osmolality and hyponatremia: Secondary | ICD-10-CM | POA: Diagnosis not present

## 2018-11-24 DIAGNOSIS — I10 Essential (primary) hypertension: Secondary | ICD-10-CM | POA: Diagnosis not present

## 2018-11-24 DIAGNOSIS — D649 Anemia, unspecified: Secondary | ICD-10-CM | POA: Diagnosis not present

## 2018-11-24 DIAGNOSIS — Z6828 Body mass index (BMI) 28.0-28.9, adult: Secondary | ICD-10-CM | POA: Diagnosis not present

## 2018-11-24 DIAGNOSIS — K219 Gastro-esophageal reflux disease without esophagitis: Secondary | ICD-10-CM | POA: Diagnosis not present

## 2018-11-24 DIAGNOSIS — E663 Overweight: Secondary | ICD-10-CM | POA: Diagnosis not present

## 2018-11-24 NOTE — Telephone Encounter (Signed)
Patient went to GP, and there was no signs of infection found. Can patient resume MTX? Please call to advise.

## 2018-11-25 NOTE — Telephone Encounter (Signed)
Returned patient call.  Instructed patient to resume MTX as infection was ruled out.  Patient verbalized understanding.    All questions encouraged and answered.  Instructed patient to call with any further questions or concerns.  Mariella Saa, PharmD, [facility] Rheumatology Clinical Pharmacist  11/25/2018 8:36 AM

## 2018-11-28 ENCOUNTER — Other Ambulatory Visit: Payer: Self-pay

## 2018-11-28 DIAGNOSIS — Z79899 Other long term (current) drug therapy: Secondary | ICD-10-CM

## 2018-11-29 LAB — CBC WITH DIFFERENTIAL/PLATELET
Absolute Monocytes: 526 cells/uL (ref 200–950)
Basophils Absolute: 101 cells/uL (ref 0–200)
Basophils Relative: 1.4 %
Eosinophils Absolute: 173 cells/uL (ref 15–500)
Eosinophils Relative: 2.4 %
HCT: 37.8 % — ABNORMAL LOW (ref 38.5–50.0)
Hemoglobin: 12.6 g/dL — ABNORMAL LOW (ref 13.2–17.1)
Lymphs Abs: 1512 cells/uL (ref 850–3900)
MCH: 27.6 pg (ref 27.0–33.0)
MCHC: 33.3 g/dL (ref 32.0–36.0)
MCV: 82.7 fL (ref 80.0–100.0)
MPV: 9.4 fL (ref 7.5–12.5)
Monocytes Relative: 7.3 %
Neutro Abs: 4889 cells/uL (ref 1500–7800)
Neutrophils Relative %: 67.9 %
Platelets: 282 10*3/uL (ref 140–400)
RBC: 4.57 10*6/uL (ref 4.20–5.80)
RDW: 15.8 % — ABNORMAL HIGH (ref 11.0–15.0)
Total Lymphocyte: 21 %
WBC: 7.2 10*3/uL (ref 3.8–10.8)

## 2018-11-29 LAB — COMPLETE METABOLIC PANEL WITH GFR
AG Ratio: 1.2 (calc) (ref 1.0–2.5)
ALT: 11 U/L (ref 9–46)
AST: 12 U/L (ref 10–35)
Albumin: 3.6 g/dL (ref 3.6–5.1)
Alkaline phosphatase (APISO): 84 U/L (ref 35–144)
BUN: 17 mg/dL (ref 7–25)
CO2: 24 mmol/L (ref 20–32)
Calcium: 8.9 mg/dL (ref 8.6–10.3)
Chloride: 97 mmol/L — ABNORMAL LOW (ref 98–110)
Creat: 0.98 mg/dL (ref 0.70–1.18)
GFR, Est African American: 87 mL/min/{1.73_m2} (ref >=60)
GFR, Est Non African American: 75 mL/min/{1.73_m2} (ref >=60)
Globulin: 3.1 g/dL (calc) (ref 1.9–3.7)
Glucose, Bld: 96 mg/dL (ref 65–99)
Potassium: 4.3 mmol/L (ref 3.5–5.3)
Sodium: 130 mmol/L — ABNORMAL LOW (ref 135–146)
Total Bilirubin: 0.6 mg/dL (ref 0.2–1.2)
Total Protein: 6.7 g/dL (ref 6.1–8.1)

## 2018-12-01 NOTE — Progress Notes (Signed)
Sodium and chloride are low.  Rest of CMP WNL.  Anemia is improving.

## 2018-12-03 DIAGNOSIS — R05 Cough: Secondary | ICD-10-CM | POA: Diagnosis not present

## 2018-12-03 DIAGNOSIS — J301 Allergic rhinitis due to pollen: Secondary | ICD-10-CM | POA: Diagnosis not present

## 2018-12-03 DIAGNOSIS — J3089 Other allergic rhinitis: Secondary | ICD-10-CM | POA: Diagnosis not present

## 2018-12-03 DIAGNOSIS — J3081 Allergic rhinitis due to animal (cat) (dog) hair and dander: Secondary | ICD-10-CM | POA: Diagnosis not present

## 2018-12-04 ENCOUNTER — Other Ambulatory Visit: Payer: Self-pay | Admitting: Rheumatology

## 2018-12-04 ENCOUNTER — Ambulatory Visit: Payer: Medicare HMO | Admitting: Cardiology

## 2018-12-04 DIAGNOSIS — M359 Systemic involvement of connective tissue, unspecified: Secondary | ICD-10-CM

## 2018-12-04 NOTE — Telephone Encounter (Signed)
Last Visit: 11/06/18 Next Visit: 02/05/19 Labs: 11/28/18 Sodium and chloride are low. Rest of CMP WNL. Anemia is improving.   Spoke with patient and he has been able to go up to 8 tabs weekly.   Okay to refill per Dr. Estanislado Pandy

## 2018-12-15 DIAGNOSIS — J3081 Allergic rhinitis due to animal (cat) (dog) hair and dander: Secondary | ICD-10-CM | POA: Diagnosis not present

## 2018-12-15 DIAGNOSIS — J301 Allergic rhinitis due to pollen: Secondary | ICD-10-CM | POA: Diagnosis not present

## 2018-12-15 DIAGNOSIS — J3089 Other allergic rhinitis: Secondary | ICD-10-CM | POA: Diagnosis not present

## 2018-12-17 ENCOUNTER — Ambulatory Visit: Payer: Self-pay | Admitting: Rheumatology

## 2018-12-22 ENCOUNTER — Telehealth: Payer: Self-pay | Admitting: Rheumatology

## 2018-12-22 NOTE — Telephone Encounter (Signed)
The rash is most likely unrelated to starting on folic acid.  Please advise patient to be evaluated by dermatologist.   Please offer a prescription for magic mouthwash to help with recurrent oral ulcerations.

## 2018-12-22 NOTE — Telephone Encounter (Signed)
Patient called stating after he began taking Folic Acid he developed a rash in his groin area which is itchy and sometimes sore.  Patient states he also noticed a growth in that area, as well canker sores in his mouth.  Patient states he has been rinsing his mouth with salt water which does seem to help.  Patient scheduled an appointment to see the dermatologist on 01/05/19, but is requesting a return call to discuss whether he should continue taking the medication.

## 2018-12-22 NOTE — Telephone Encounter (Signed)
Attempted to contact the patient and unable to leave message no answer.

## 2018-12-22 NOTE — Telephone Encounter (Signed)
Patient states he has a rash in the groin area and a growth that started about 3 weeks ago. Patient is due to see the dermatologist on 01/05/19 to have the growth evaluated. Patient states the rash is itchy and sore at times. Patient states he has used cortisone cream with no relief and Benadryl cream with relief. Patient states he has been on MTX and folic acid for almost 2 months. Patient states the rash started almost immediately after starting the Folic Acid. Patient states he has a sore in his mouth. Patient is not sure as to how long it has been there. Patient states he has been having reoccurring mouth sores. Patient has been doing a salt water rinse that does seem to help. Please advise.

## 2018-12-23 ENCOUNTER — Other Ambulatory Visit: Payer: Self-pay

## 2018-12-23 DIAGNOSIS — J3089 Other allergic rhinitis: Secondary | ICD-10-CM | POA: Diagnosis not present

## 2018-12-23 DIAGNOSIS — J3081 Allergic rhinitis due to animal (cat) (dog) hair and dander: Secondary | ICD-10-CM | POA: Diagnosis not present

## 2018-12-23 DIAGNOSIS — Z79899 Other long term (current) drug therapy: Secondary | ICD-10-CM

## 2018-12-23 DIAGNOSIS — J301 Allergic rhinitis due to pollen: Secondary | ICD-10-CM | POA: Diagnosis not present

## 2018-12-23 MED ORDER — MAGIC MOUTHWASH: 5.0000 mL | Freq: Four times a day (QID) | ORAL | 0 refills | Status: DC

## 2018-12-23 NOTE — Addendum Note (Signed)
Addended by: Carole Binning on: 12/23/2018 02:37 PM   Modules accepted: Orders

## 2018-12-23 NOTE — Telephone Encounter (Signed)
Spoke with patient as he stopped by the office and advised patient The rash is most likely unrelated to starting on folic acid.  Advised patient to be evaluated by dermatologist.   Patient offered a prescription for magic mouthwash to help with recurrent oral ulcerations and patient accepted. Prescription sent to the pharmacy.

## 2018-12-23 NOTE — Telephone Encounter (Signed)
Attempted to contact the patient and left message with patient's wife for patient to call the office.

## 2018-12-24 LAB — COMPLETE METABOLIC PANEL WITH GFR
AG Ratio: 1.3 (calc) (ref 1.0–2.5)
ALT: 12 U/L (ref 9–46)
AST: 12 U/L (ref 10–35)
Albumin: 3.9 g/dL (ref 3.6–5.1)
Alkaline phosphatase (APISO): 90 U/L (ref 35–144)
BUN: 16 mg/dL (ref 7–25)
CO2: 26 mmol/L (ref 20–32)
Calcium: 9.1 mg/dL (ref 8.6–10.3)
Chloride: 98 mmol/L (ref 98–110)
Creat: 1.08 mg/dL (ref 0.70–1.18)
GFR, Est African American: 77 mL/min/{1.73_m2} (ref >=60)
GFR, Est Non African American: 67 mL/min/{1.73_m2} (ref >=60)
Globulin: 3.1 g/dL (calc) (ref 1.9–3.7)
Glucose, Bld: 95 mg/dL (ref 65–99)
Potassium: 4.2 mmol/L (ref 3.5–5.3)
Sodium: 131 mmol/L — ABNORMAL LOW (ref 135–146)
Total Bilirubin: 0.5 mg/dL (ref 0.2–1.2)
Total Protein: 7 g/dL (ref 6.1–8.1)

## 2018-12-24 LAB — CBC WITH DIFFERENTIAL/PLATELET
Absolute Monocytes: 628 cells/uL (ref 200–950)
Basophils Absolute: 48 cells/uL (ref 0–200)
Basophils Relative: 0.7 %
Eosinophils Absolute: 159 cells/uL (ref 15–500)
Eosinophils Relative: 2.3 %
HCT: 36.7 % — ABNORMAL LOW (ref 38.5–50.0)
Hemoglobin: 12.3 g/dL — ABNORMAL LOW (ref 13.2–17.1)
Lymphs Abs: 1235 cells/uL (ref 850–3900)
MCH: 28.5 pg (ref 27.0–33.0)
MCHC: 33.5 g/dL (ref 32.0–36.0)
MCV: 85 fL (ref 80.0–100.0)
MPV: 9.3 fL (ref 7.5–12.5)
Monocytes Relative: 9.1 %
Neutro Abs: 4830 cells/uL (ref 1500–7800)
Neutrophils Relative %: 70 %
Platelets: 252 10*3/uL (ref 140–400)
RBC: 4.32 10*6/uL (ref 4.20–5.80)
RDW: 16.1 % — ABNORMAL HIGH (ref 11.0–15.0)
Total Lymphocyte: 17.9 %
WBC: 6.9 10*3/uL (ref 3.8–10.8)

## 2018-12-24 NOTE — Progress Notes (Signed)
Labs are stable.

## 2018-12-25 DIAGNOSIS — L304 Erythema intertrigo: Secondary | ICD-10-CM | POA: Diagnosis not present

## 2018-12-25 DIAGNOSIS — J3081 Allergic rhinitis due to animal (cat) (dog) hair and dander: Secondary | ICD-10-CM | POA: Diagnosis not present

## 2018-12-25 DIAGNOSIS — J3089 Other allergic rhinitis: Secondary | ICD-10-CM | POA: Diagnosis not present

## 2018-12-25 DIAGNOSIS — L820 Inflamed seborrheic keratosis: Secondary | ICD-10-CM | POA: Diagnosis not present

## 2018-12-25 DIAGNOSIS — J301 Allergic rhinitis due to pollen: Secondary | ICD-10-CM | POA: Diagnosis not present

## 2018-12-29 DIAGNOSIS — J3081 Allergic rhinitis due to animal (cat) (dog) hair and dander: Secondary | ICD-10-CM | POA: Diagnosis not present

## 2018-12-29 DIAGNOSIS — J301 Allergic rhinitis due to pollen: Secondary | ICD-10-CM | POA: Diagnosis not present

## 2018-12-29 DIAGNOSIS — J3089 Other allergic rhinitis: Secondary | ICD-10-CM | POA: Diagnosis not present

## 2019-01-05 DIAGNOSIS — J3081 Allergic rhinitis due to animal (cat) (dog) hair and dander: Secondary | ICD-10-CM | POA: Diagnosis not present

## 2019-01-05 DIAGNOSIS — D649 Anemia, unspecified: Secondary | ICD-10-CM | POA: Diagnosis not present

## 2019-01-05 DIAGNOSIS — R7309 Other abnormal glucose: Secondary | ICD-10-CM | POA: Diagnosis not present

## 2019-01-05 DIAGNOSIS — J3089 Other allergic rhinitis: Secondary | ICD-10-CM | POA: Diagnosis not present

## 2019-01-05 DIAGNOSIS — E871 Hypo-osmolality and hyponatremia: Secondary | ICD-10-CM | POA: Diagnosis not present

## 2019-01-05 DIAGNOSIS — E748 Other specified disorders of carbohydrate metabolism: Secondary | ICD-10-CM | POA: Diagnosis not present

## 2019-01-05 DIAGNOSIS — J301 Allergic rhinitis due to pollen: Secondary | ICD-10-CM | POA: Diagnosis not present

## 2019-01-08 DIAGNOSIS — J3089 Other allergic rhinitis: Secondary | ICD-10-CM | POA: Diagnosis not present

## 2019-01-08 DIAGNOSIS — J301 Allergic rhinitis due to pollen: Secondary | ICD-10-CM | POA: Diagnosis not present

## 2019-01-08 DIAGNOSIS — J3081 Allergic rhinitis due to animal (cat) (dog) hair and dander: Secondary | ICD-10-CM | POA: Diagnosis not present

## 2019-01-13 DIAGNOSIS — J3089 Other allergic rhinitis: Secondary | ICD-10-CM | POA: Diagnosis not present

## 2019-01-13 DIAGNOSIS — J3081 Allergic rhinitis due to animal (cat) (dog) hair and dander: Secondary | ICD-10-CM | POA: Diagnosis not present

## 2019-01-13 DIAGNOSIS — J301 Allergic rhinitis due to pollen: Secondary | ICD-10-CM | POA: Diagnosis not present

## 2019-01-19 DIAGNOSIS — J3089 Other allergic rhinitis: Secondary | ICD-10-CM | POA: Diagnosis not present

## 2019-01-19 DIAGNOSIS — J3081 Allergic rhinitis due to animal (cat) (dog) hair and dander: Secondary | ICD-10-CM | POA: Diagnosis not present

## 2019-01-19 DIAGNOSIS — J301 Allergic rhinitis due to pollen: Secondary | ICD-10-CM | POA: Diagnosis not present

## 2019-01-22 NOTE — Progress Notes (Signed)
Office Visit Note  Patient: Cole Brown             Date of Birth: 04/27/1946           MRN: [id number]             PCP: Redmond School, MD Referring: Redmond School, MD Visit Date: 02/05/2019 Occupation: _0 @  Subjective:  Medication monitoring.   History of Present Illness: RAS KOLLMAN is a 73 y.o. male with history of seronegative inflammatory arthritis.  He states he has been doing very well on methotrexate.  He has been on methotrexate for almost 3 months now.  He noted immediate improvement on methotrexate and continues to do well.  He states he has occasional discomfort in his wrist joints and hands.  He denies any joint swelling.  None of the other joints are painful.  Activities of Daily Living:  Patient reports morning stiffness for 0 minutes.   Patient Reports nocturnal pain.  Difficulty dressing/grooming: Denies Difficulty climbing stairs: Denies Difficulty getting out of chair: Denies Difficulty using hands for taps, buttons, cutlery, and/or writing: Reports  Review of Systems  Constitutional: Negative for fatigue.  HENT: Positive for mouth dryness. Negative for mouth sores and nose dryness.   Eyes: Negative for pain, itching and dryness.  Respiratory: Negative for shortness of breath, wheezing and difficulty breathing.   Cardiovascular: Negative for chest pain, palpitations and swelling in legs/feet.  Gastrointestinal: Negative for abdominal pain, blood in stool, constipation and diarrhea.  Endocrine: Negative for increased urination.  Genitourinary: Negative for difficulty urinating and painful urination.  Musculoskeletal: Positive for arthralgias and joint pain. Negative for joint swelling and morning stiffness.  Skin: Negative for rash and redness.  Allergic/Immunologic: Negative for susceptible to infections.  Neurological: Negative for dizziness, light-headedness, headaches, memory loss and weakness.  Hematological: Negative for  bruising/bleeding tendency.  Psychiatric/Behavioral: Negative for confusion and sleep disturbance.    PMFS History:  Patient Active Problem List   Diagnosis Date Noted  . Autoimmune disease ([place]) 09/15/2018  . 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 Hypercholesteremia 08/21/2018  . History of diverticulitis 08/21/2018  . History of iron deficiency anemia 08/21/2018  . 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 Fibromyalgia 08/21/2018  . History of multiple allergies 08/21/2018  . History of sleep apnea 08/21/2018  . Rectal bleed 11/14/2016  . Essential hypertension 11/14/2016    Past Medical History:  Diagnosis Date  . Diverticula of colon    2016 colonoscopy  . Fibromyalgia   . Fibromyalgia   . History of GI diverticular bleed   . Hypercholesteremia   . Hypertension     Family History  Problem Relation Age of Onset  . Heart disease Mother   . Pancreatic cancer Mother   . Prostate cancer Father   . Healthy Sister   . Parkinson's disease Brother   . Asthma Sister   . Healthy Sister    Past Surgical History:  Procedure Laterality Date  . allergy shots  weekly  . CHOLECYSTECTOMY    . COLONOSCOPY     Dr.Rehman  . COLONOSCOPY N/A 01/27/2015   Procedure: COLONOSCOPY;  Surgeon: Rogene Houston, MD;  Location[facility];  Service: Endoscopy;  Laterality: N/A;  930  . EYE SURGERY Bilateral    partial cornea transplants   . GALLBLADDER SURGERY  12/1993  . UPPER GASTROINTESTINAL ENDOSCOPY     Social History   Social History Narrative  . Not on file    There is  no immunization history on file for this patient.   Objective: Vital Signs: BP 134/80 (BP Location: Left Arm, Patient Position: Sitting, Cuff Size: Normal)   Pulse (!) 56   Resp 15   Ht _0  (1.803 m)   Wt 201 lb 6.4 oz (91.4 kg)   BMI 28.09 kg/m    Physical Exam Vitals signs and nursing note reviewed.  Constitutional:      Appearance: He is well-developed.  HENT:     Head: Normocephalic  and atraumatic.  Eyes:     Conjunctiva/sclera: Conjunctivae normal.     Pupils: Pupils are equal, round, and reactive to light.  Neck:     Musculoskeletal: Normal range of motion and neck supple.  Cardiovascular:     Rate and Rhythm: Normal rate and regular rhythm.     Heart sounds: Normal heart sounds.  Pulmonary:     Effort: Pulmonary effort is normal.     Breath sounds: Normal breath sounds.  Abdominal:     General: Bowel sounds are normal.     Palpations: Abdomen is soft.  Skin:    General: Skin is warm and dry.     Capillary Refill: Capillary refill takes less than 2 seconds.  Neurological:     Mental Status: He is alert and oriented to person, place, and time.  Psychiatric:        Behavior: Behavior normal.      Musculoskeletal Exam: C-spine thoracic and lumbar spine with good range of motion.  Shoulder joints elbow joints wrist joints with good range of motion.  He has some synovial thickening over MCP joints and the wrist joints.  Hip joints, knee joints, ankles and MTPs with good range of motion with no synovitis.  CDAI Exam: CDAI Score: 0.4  Patient Global: 2 mm; Provider Global: 2 mm Swollen: 0 ; Tender: 0  Joint Exam   No joint exam has been documented for this visit   There is currently no information documented on the homunculus. Go to the Rheumatology activity and complete the homunculus joint exam.  Investigation: No additional findings.  Imaging: No results found.  Recent Labs: Lab Results  Component Value Date   WBC 6.9 12/23/2018   HGB 12.3 (L) 12/23/2018   PLT 252 12/23/2018   NA 131 (L) 12/23/2018   K 4.2 12/23/2018   CL 98 12/23/2018   CO2 26 12/23/2018   GLUCOSE 95 12/23/2018   BUN 16 12/23/2018   CREATININE 1.08 12/23/2018   BILITOT 0.5 12/23/2018   ALKPHOS 69 11/15/2016   AST 12 12/23/2018   ALT 12 12/23/2018   PROT 7.0 12/23/2018   ALBUMIN 3.5 11/15/2016   CALCIUM 9.1 12/23/2018   GFRAA 77 12/23/2018   QFTBGOLDPLUS NEGATIVE  08/21/2018    Speciality Comments: No specialty comments available.  Procedures:  No procedures performed Allergies: Plaquenil [hydroxychloroquine sulfate] and Lodine [etodolac]   Assessment / Plan:     Visit Diagnoses: Autoimmune disease ([place]) - ANA negative ENA negative, [name] phenomenon, beta-2 GP 1 IgM positive, anticardiolipin IgM positive, elevated ESR, severe inflammatory arthritis.  He is done extremely well on methotrexate.  He has some arthralgias but no synovitis was noted.  High risk medication use - MTX 8 tablets po once weekly, folic acid 2 mg po daily.  D/c PLQ-rash.  His labs have been stable.  We will continue to monitor labs every 3 months.  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  Fibromyalgia-he continues to have some generalized pain from fibromyalgia which  is manageable.  Essential hypertension-his blood pressure is fairly well controlled.  Other medical problems are listed as follows:  History of diverticulitis  History of iron deficiency anemia  History of multiple allergies  History of sleep apnea  Hypercholesteremia  Fuchs' corneal dystrophy  Orders: No orders of the defined types were placed in this encounter.  No orders of the defined types were placed in this encounter.   Follow-Up Instructions: Return in about 5 months (around 07/08/2019) for Rheumatoid arthritis.   Bo Merino, MD  Note - This record has been created using Editor, commissioning.  Chart creation errors have been sought, but may not always  have been located. Such creation errors do not reflect on  the standard of medical care.

## 2019-01-23 DIAGNOSIS — G4733 Obstructive sleep apnea (adult) (pediatric): Secondary | ICD-10-CM | POA: Diagnosis not present

## 2019-01-26 DIAGNOSIS — J301 Allergic rhinitis due to pollen: Secondary | ICD-10-CM | POA: Diagnosis not present

## 2019-01-26 DIAGNOSIS — J3081 Allergic rhinitis due to animal (cat) (dog) hair and dander: Secondary | ICD-10-CM | POA: Diagnosis not present

## 2019-01-26 DIAGNOSIS — J3089 Other allergic rhinitis: Secondary | ICD-10-CM | POA: Diagnosis not present

## 2019-01-29 DIAGNOSIS — J301 Allergic rhinitis due to pollen: Secondary | ICD-10-CM | POA: Diagnosis not present

## 2019-01-29 DIAGNOSIS — J3081 Allergic rhinitis due to animal (cat) (dog) hair and dander: Secondary | ICD-10-CM | POA: Diagnosis not present

## 2019-01-29 DIAGNOSIS — J3089 Other allergic rhinitis: Secondary | ICD-10-CM | POA: Diagnosis not present

## 2019-01-30 ENCOUNTER — Ambulatory Visit (INDEPENDENT_AMBULATORY_CARE_PROVIDER_SITE_OTHER): Payer: Medicare HMO | Admitting: Cardiology

## 2019-01-30 ENCOUNTER — Encounter: Payer: Self-pay | Admitting: Cardiology

## 2019-01-30 ENCOUNTER — Other Ambulatory Visit: Payer: Self-pay

## 2019-01-30 VITALS — BP 151/86 | HR 58 | Temp 98.0°F | Ht 71.0 in | Wt 203.0 lb

## 2019-01-30 DIAGNOSIS — R002 Palpitations: Secondary | ICD-10-CM

## 2019-01-30 DIAGNOSIS — R0789 Other chest pain: Secondary | ICD-10-CM

## 2019-01-30 DIAGNOSIS — I10 Essential (primary) hypertension: Secondary | ICD-10-CM | POA: Diagnosis not present

## 2019-01-30 MED ORDER — AMLODIPINE BESYLATE 5 MG PO TABS: 5.0000 mg | ORAL_TABLET | Freq: Every day | ORAL | 3 refills | Status: DC

## 2019-01-30 NOTE — Patient Instructions (Addendum)
Medication Instructions:   Your physician has recommended you make the following change in your medication:   Start amlodipine 5 mg by mouth daily  Continue all other medications the same  Labwork:  NONE  Testing/Procedures:   NONE  Follow-Up:  Your physician recommends that you schedule a follow-up appointment in: 1 year. You will receive a reminder letter in the mail in about 10 months reminding you to call and schedule your appointment. If you don't receive this letter, please contact our office.  Any Other Special Instructions Will Be Listed Below (If Applicable).  Please contact our office after your family doctor's office appointment with your blood pressure reading from that visit.   If you need a refill on your cardiac medications before your next appointment, please call your pharmacy.

## 2019-01-30 NOTE — Progress Notes (Signed)
Clinical Summary Cole Brown is a 73 y.o.male seen today for follow up of the following medical problems.  1. Palpitations - prior symptoms seemed to be related to severe allergies, resolved with resolution of his allergy symptoms.  - no recent palpitations.   -no recent palpitations. Compliant with meds  2. Chest pain - chest tightness episode about 3 weeks ago. Chest tightness midchest, felt heavy. Felt lightheaded, confused. No palpitations at the time. Tightness lasted about 20 minutes. Went away on its own.  - has had other episodes of tightness since that time.  - denies any DOE. Sedentary lifestyle. - history of fibromyalgia per his report.  CAD risk factors: HTN, mother MI at 82 12/2016 echo: LVEF 60-65%, abnormal diastoilc function,   - denies any chest pain   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     4. HTN - compliant with meds - from chart review consistently elevated at other provider appointments    Past Medical History:  Diagnosis Date  . Diverticula of colon    2016 colonoscopy  . Fibromyalgia   . Fibromyalgia   . History of GI diverticular bleed   . Hypercholesteremia   . Hypertension      Allergies  Allergen Reactions  . Plaquenil [Hydroxychloroquine Sulfate]     rash  . Lodine [Etodolac] Swelling and Rash     Current Outpatient Medications  Medication Sig Dispense Refill  . atenolol (TENORMIN) 25 MG tablet TAKE (1) TABLET BY MOUTH ONCE DAILY. 90 tablet 3  . Azelastine-Fluticasone (DYMISTA) 137-50 MCG/ACT SUSP Place into the nose 2 (two) times daily.     Marland Kitchen CHERRY PO Take by mouth.    . diphenhydrAMINE (BENADRYL) 25 MG tablet Take 25 mg by mouth as needed for allergies.     . Ferrous Sulfate (SLOW FE PO) Take 1 tablet by mouth daily.    Marland Kitchen FLUoxetine (PROZAC) 10 MG tablet Take 10 mg by mouth daily.    . fluticasone (FLONASE ALLERGY RELIEF) 50 MCG/ACT nasal spray Place 2  sprays into both nostrils daily as needed for allergies or rhinitis.     . folic acid (FOLVITE) 1 MG tablet Take 2 tablets (2 mg total) by mouth daily. 180 tablet 3  . Ginger, Zingiber officinalis, (GINGER PO) Take by mouth daily.    Marland Kitchen levocetirizine (XYZAL) 5 MG tablet Take 5 mg by mouth every evening.     Marland Kitchen losartan (COZAAR) 50 MG tablet Take 100 mg by mouth every morning.     . magic mouthwash SOLN Take 5 mLs by mouth 4 (four) times daily. 240 mL 0  . methotrexate 2.5 MG tablet Take 8 tablets (20 mg total) by mouth once a week. 32 tablet 2  . montelukast (SINGULAIR) 10 MG tablet Take 10 mg by mouth at bedtime.    . Omega-3 Fatty Acids (FISH OIL) 1000 MG CAPS Take 1 capsule by mouth 2 (two) times daily.    . prednisoLONE acetate (PRED FORTE) 1 % ophthalmic suspension Place 1 drop into the left eye daily.    . ranitidine (ZANTAC) 75 MG tablet Take 75 mg by mouth daily as needed for heartburn.     . triazolam (HALCION) 0.25 MG tablet Take 0.125 mg by mouth at bedtime.      No current facility-administered medications for this visit.      Past Surgical History:  Procedure Laterality Date  . allergy shots  weekly  . CHOLECYSTECTOMY    .  COLONOSCOPY     Dr.Rehman  . COLONOSCOPY N/A 01/27/2015   Procedure: COLONOSCOPY;  Surgeon: Rogene Houston, MD;  Location[facility];  Service: Endoscopy;  Laterality: N/A;  930  . EYE SURGERY Bilateral    partial cornea transplants   . GALLBLADDER SURGERY  12/1993  . UPPER GASTROINTESTINAL ENDOSCOPY       Allergies  Allergen Reactions  . Plaquenil [Hydroxychloroquine Sulfate]     rash  . Lodine [Etodolac] Swelling and Rash      Family History  Problem Relation Age of Onset  . Heart disease Mother   . Pancreatic cancer Mother   . Prostate cancer Father   . Healthy Sister   . Parkinson's disease Brother   . Asthma Sister   . Healthy Sister      Social History Cole Brown reports that he has never smoked. He has never used  smokeless tobacco. Cole Brown reports current alcohol use.   Review of Systems CONSTITUTIONAL: No weight loss, fever, chills, weakness or fatigue.  HEENT: Eyes: No visual loss, blurred vision, double vision or yellow sclerae.No hearing loss, sneezing, congestion, runny nose or sore throat.  SKIN: No rash or itching.  CARDIOVASCULAR: per hpi RESPIRATORY: No shortness of breath, cough or sputum.  GASTROINTESTINAL: No anorexia, nausea, vomiting or diarrhea. No abdominal pain or blood.  GENITOURINARY: No burning on urination, no polyuria NEUROLOGICAL: No headache, dizziness, syncope, paralysis, ataxia, numbness or tingling in the extremities. No change in bowel or bladder control.  MUSCULOSKELETAL: No muscle, back pain, joint pain or stiffness.  LYMPHATICS: No enlarged nodes. No history of splenectomy.  PSYCHIATRIC: No history of depression or anxiety.  ENDOCRINOLOGIC: No reports of sweating, cold or heat intolerance. No polyuria or polydipsia.  Marland Kitchen   Physical Examination Today's Vitals   01/30/19 1337  BP: (!) 151/86  Pulse: (!) 58  Temp: 98 F (36.7 C)  Weight: 203 lb (92.1 kg)  Height: 5\' 11"  (1.803 m)   Body mass index is 28.31 kg/m.  Gen: resting comfortably, no acute distress HEENT: no scleral icterus, pupils equal round and reactive, no palptable cervical adenopathy,  CV: RRR, no mr/g, no jvd Resp: Clear to auscultation bilaterally GI: abdomen is soft, non-tender, non-distended, normal bowel sounds, no hepatosplenomegaly MSK: extremities are warm, no edema.  Skin: warm, no rash Neuro:  no focal deficits Psych: appropriate affect   Diagnostic Studies  12/2016 echo Study Conclusions  - Left ventricle: The cavity size was normal. Systolic function was normal. The estimated ejection fraction was in the range of 60% to 65%. There was an increased relative contribution of atrial contraction to ventricular filling. Mild focal basal septal hypertrophy. - Aorta:  Very mild ascending aortic dilatation. Maximal diameter 3.8 cm. - Mitral valve: There was trivial regurgitation. - Tricuspid valve: There was mild regurgitation. - Pulmonary arteries: PA peak pressure: 36 mm Hg (S). - Systemic veins: IVC is dilated with normal respiratory variation. Estimated RAP 8 mmHg.   Assessment and Plan   1. Palpitations/Chest pain -prior symptoms seemed to correlate with severe allergies at the time, no recurrence - no recent symptoms, continue to monitor.   2.HTN - above goal, start norvasc 5mg  daily - he will update [place] with his bp next week    F/u 1 ywear    Arnoldo Lenis, M.D.

## 2019-02-02 DIAGNOSIS — J3089 Other allergic rhinitis: Secondary | ICD-10-CM | POA: Diagnosis not present

## 2019-02-02 DIAGNOSIS — J3081 Allergic rhinitis due to animal (cat) (dog) hair and dander: Secondary | ICD-10-CM | POA: Diagnosis not present

## 2019-02-02 DIAGNOSIS — J301 Allergic rhinitis due to pollen: Secondary | ICD-10-CM | POA: Diagnosis not present

## 2019-02-03 DIAGNOSIS — F419 Anxiety disorder, unspecified: Secondary | ICD-10-CM | POA: Diagnosis not present

## 2019-02-05 ENCOUNTER — Ambulatory Visit: Payer: Medicare HMO | Admitting: Rheumatology

## 2019-02-05 ENCOUNTER — Encounter: Payer: Self-pay | Admitting: Rheumatology

## 2019-02-05 ENCOUNTER — Other Ambulatory Visit: Payer: Self-pay

## 2019-02-05 VITALS — BP 134/80 | HR 56 | Resp 15 | Ht 71.0 in | Wt 201.4 lb

## 2019-02-05 DIAGNOSIS — Z9189 Other specified personal risk factors, not elsewhere classified: Secondary | ICD-10-CM | POA: Diagnosis not present

## 2019-02-05 DIAGNOSIS — M359 Systemic involvement of connective tissue, unspecified: Secondary | ICD-10-CM

## 2019-02-05 DIAGNOSIS — I10 Essential (primary) hypertension: Secondary | ICD-10-CM

## 2019-02-05 DIAGNOSIS — Z889 Allergy status to unspecified drugs, medicaments and biological substances status: Secondary | ICD-10-CM

## 2019-02-05 DIAGNOSIS — J301 Allergic rhinitis due to pollen: Secondary | ICD-10-CM | POA: Diagnosis not present

## 2019-02-05 DIAGNOSIS — E7800 Pure hypercholesterolemia, unspecified: Secondary | ICD-10-CM

## 2019-02-05 DIAGNOSIS — I7300 Raynaud's syndrome without gangrene: Secondary | ICD-10-CM

## 2019-02-05 DIAGNOSIS — Z8669 Personal history of other diseases of the nervous system and sense organs: Secondary | ICD-10-CM

## 2019-02-05 DIAGNOSIS — H18519 Endothelial corneal dystrophy, unspecified eye: Secondary | ICD-10-CM

## 2019-02-05 DIAGNOSIS — M797 Fibromyalgia: Secondary | ICD-10-CM

## 2019-02-05 DIAGNOSIS — Z8719 Personal history of other diseases of the digestive system: Secondary | ICD-10-CM | POA: Diagnosis not present

## 2019-02-05 DIAGNOSIS — Z79899 Other long term (current) drug therapy: Secondary | ICD-10-CM | POA: Diagnosis not present

## 2019-02-05 DIAGNOSIS — Z862 Personal history of diseases of the blood and blood-forming organs and certain disorders involving the immune mechanism: Secondary | ICD-10-CM

## 2019-02-05 DIAGNOSIS — H1851 Endothelial corneal dystrophy: Secondary | ICD-10-CM

## 2019-02-05 DIAGNOSIS — J3089 Other allergic rhinitis: Secondary | ICD-10-CM | POA: Diagnosis not present

## 2019-02-05 DIAGNOSIS — J3081 Allergic rhinitis due to animal (cat) (dog) hair and dander: Secondary | ICD-10-CM | POA: Diagnosis not present

## 2019-02-05 NOTE — Patient Instructions (Signed)
Standing Labs We placed an order today for your standing lab work.    Please come back and get your standing labs in October and every three months  We have open lab daily Monday through Thursday from 8:30-12:30 PM and 1:30-4:30 PM and Friday from 8:30-12:30 PM and 1:30 -4:00 PM at the office of Dr. Kuuipo Anzaldo.   You may experience shorter wait times on Monday and Friday afternoons. The office is located at [address], [address], [place], [place] [address] No appointment is necessary.   Labs are drawn by [facility].  You may receive a bill from [facility] for your lab work.  If you wish to have your labs drawn at another location, please call the office 24 hours in advance to send orders.  If you have any questions regarding directions or hours of operation,  please call [phone].   Just as a reminder please drink plenty of water prior to coming for your lab work. Thanks!  

## 2019-02-10 DIAGNOSIS — J3081 Allergic rhinitis due to animal (cat) (dog) hair and dander: Secondary | ICD-10-CM | POA: Diagnosis not present

## 2019-02-10 DIAGNOSIS — J3089 Other allergic rhinitis: Secondary | ICD-10-CM | POA: Diagnosis not present

## 2019-02-10 DIAGNOSIS — J301 Allergic rhinitis due to pollen: Secondary | ICD-10-CM | POA: Diagnosis not present

## 2019-02-11 ENCOUNTER — Telehealth: Payer: Self-pay | Admitting: Cardiology

## 2019-02-11 NOTE — Telephone Encounter (Signed)
Patient informed. 

## 2019-02-11 NOTE — Telephone Encounter (Signed)
Patient called to report a BP reading from 02-05-2019   130/81 P 56  He states that his family has a slow pulse.

## 2019-02-11 NOTE — Telephone Encounter (Signed)
He is on atenolol so heart rate in the 50 bpm range is not concerning.  It was 58 bpm at his office visit with Dr. Harl Bowie.

## 2019-02-11 NOTE — Telephone Encounter (Signed)
BP requested at last visit. Will be sent to provider for review.

## 2019-02-16 DIAGNOSIS — J3089 Other allergic rhinitis: Secondary | ICD-10-CM | POA: Diagnosis not present

## 2019-02-16 DIAGNOSIS — M797 Fibromyalgia: Secondary | ICD-10-CM | POA: Diagnosis not present

## 2019-02-16 DIAGNOSIS — E782 Mixed hyperlipidemia: Secondary | ICD-10-CM | POA: Diagnosis not present

## 2019-02-16 DIAGNOSIS — J301 Allergic rhinitis due to pollen: Secondary | ICD-10-CM | POA: Diagnosis not present

## 2019-02-16 DIAGNOSIS — I10 Essential (primary) hypertension: Secondary | ICD-10-CM | POA: Diagnosis not present

## 2019-02-16 DIAGNOSIS — K589 Irritable bowel syndrome without diarrhea: Secondary | ICD-10-CM | POA: Diagnosis not present

## 2019-02-16 DIAGNOSIS — J3081 Allergic rhinitis due to animal (cat) (dog) hair and dander: Secondary | ICD-10-CM | POA: Diagnosis not present

## 2019-02-24 DIAGNOSIS — J3081 Allergic rhinitis due to animal (cat) (dog) hair and dander: Secondary | ICD-10-CM | POA: Diagnosis not present

## 2019-02-24 DIAGNOSIS — J3089 Other allergic rhinitis: Secondary | ICD-10-CM | POA: Diagnosis not present

## 2019-02-24 DIAGNOSIS — J301 Allergic rhinitis due to pollen: Secondary | ICD-10-CM | POA: Diagnosis not present

## 2019-02-27 DIAGNOSIS — J3089 Other allergic rhinitis: Secondary | ICD-10-CM | POA: Diagnosis not present

## 2019-02-27 DIAGNOSIS — R05 Cough: Secondary | ICD-10-CM | POA: Diagnosis not present

## 2019-02-27 DIAGNOSIS — J3081 Allergic rhinitis due to animal (cat) (dog) hair and dander: Secondary | ICD-10-CM | POA: Diagnosis not present

## 2019-02-27 DIAGNOSIS — J301 Allergic rhinitis due to pollen: Secondary | ICD-10-CM | POA: Diagnosis not present

## 2019-03-03 DIAGNOSIS — J3089 Other allergic rhinitis: Secondary | ICD-10-CM | POA: Diagnosis not present

## 2019-03-03 DIAGNOSIS — J3081 Allergic rhinitis due to animal (cat) (dog) hair and dander: Secondary | ICD-10-CM | POA: Diagnosis not present

## 2019-03-03 DIAGNOSIS — J301 Allergic rhinitis due to pollen: Secondary | ICD-10-CM | POA: Diagnosis not present

## 2019-03-05 ENCOUNTER — Other Ambulatory Visit: Payer: Self-pay | Admitting: Rheumatology

## 2019-03-05 DIAGNOSIS — M359 Systemic involvement of connective tissue, unspecified: Secondary | ICD-10-CM

## 2019-03-05 NOTE — Telephone Encounter (Signed)
Last Visit: 02/05/19 Next Visit: 07/08/18 Labs: 12/23/18 Stable   Okay to refill per Dr. Estanislado Pandy

## 2019-03-10 DIAGNOSIS — J3089 Other allergic rhinitis: Secondary | ICD-10-CM | POA: Diagnosis not present

## 2019-03-10 DIAGNOSIS — J3081 Allergic rhinitis due to animal (cat) (dog) hair and dander: Secondary | ICD-10-CM | POA: Diagnosis not present

## 2019-03-10 DIAGNOSIS — J301 Allergic rhinitis due to pollen: Secondary | ICD-10-CM | POA: Diagnosis not present

## 2019-03-17 DIAGNOSIS — J3081 Allergic rhinitis due to animal (cat) (dog) hair and dander: Secondary | ICD-10-CM | POA: Diagnosis not present

## 2019-03-17 DIAGNOSIS — J3089 Other allergic rhinitis: Secondary | ICD-10-CM | POA: Diagnosis not present

## 2019-03-17 DIAGNOSIS — J301 Allergic rhinitis due to pollen: Secondary | ICD-10-CM | POA: Diagnosis not present

## 2019-03-23 DIAGNOSIS — J3089 Other allergic rhinitis: Secondary | ICD-10-CM | POA: Diagnosis not present

## 2019-03-23 DIAGNOSIS — Z23 Encounter for immunization: Secondary | ICD-10-CM | POA: Diagnosis not present

## 2019-03-23 DIAGNOSIS — J301 Allergic rhinitis due to pollen: Secondary | ICD-10-CM | POA: Diagnosis not present

## 2019-03-23 DIAGNOSIS — J3081 Allergic rhinitis due to animal (cat) (dog) hair and dander: Secondary | ICD-10-CM | POA: Diagnosis not present

## 2019-03-31 DIAGNOSIS — J301 Allergic rhinitis due to pollen: Secondary | ICD-10-CM | POA: Diagnosis not present

## 2019-03-31 DIAGNOSIS — J3089 Other allergic rhinitis: Secondary | ICD-10-CM | POA: Diagnosis not present

## 2019-03-31 DIAGNOSIS — J3081 Allergic rhinitis due to animal (cat) (dog) hair and dander: Secondary | ICD-10-CM | POA: Diagnosis not present

## 2019-04-06 ENCOUNTER — Encounter (INDEPENDENT_AMBULATORY_CARE_PROVIDER_SITE_OTHER): Payer: Self-pay | Admitting: *Deleted

## 2019-04-06 DIAGNOSIS — J3081 Allergic rhinitis due to animal (cat) (dog) hair and dander: Secondary | ICD-10-CM | POA: Diagnosis not present

## 2019-04-06 DIAGNOSIS — J3089 Other allergic rhinitis: Secondary | ICD-10-CM | POA: Diagnosis not present

## 2019-04-06 DIAGNOSIS — J301 Allergic rhinitis due to pollen: Secondary | ICD-10-CM | POA: Diagnosis not present

## 2019-04-14 DIAGNOSIS — J301 Allergic rhinitis due to pollen: Secondary | ICD-10-CM | POA: Diagnosis not present

## 2019-04-14 DIAGNOSIS — J3089 Other allergic rhinitis: Secondary | ICD-10-CM | POA: Diagnosis not present

## 2019-04-14 DIAGNOSIS — J3081 Allergic rhinitis due to animal (cat) (dog) hair and dander: Secondary | ICD-10-CM | POA: Diagnosis not present

## 2019-04-17 ENCOUNTER — Ambulatory Visit
Admission: EM | Admit: 2019-04-17 | Discharge: 2019-04-17 | Disposition: A | Discharge status: Home / Self Care | Payer: Medicare HMO | Attending: Emergency Medicine | Admitting: Emergency Medicine

## 2019-04-17 ENCOUNTER — Other Ambulatory Visit: Payer: Self-pay

## 2019-04-17 DIAGNOSIS — B029 Zoster without complications: Secondary | ICD-10-CM | POA: Diagnosis not present

## 2019-04-17 MED ORDER — PREDNISONE 10 MG (21) PO TBPK: ORAL_TABLET | Freq: Every day | ORAL | 0 refills | Status: DC

## 2019-04-17 MED ORDER — VALACYCLOVIR HCL 1 G PO TABS: 1000.0000 mg | ORAL_TABLET | Freq: Three times a day (TID) | ORAL | 0 refills | Status: AC

## 2019-04-17 NOTE — ED Triage Notes (Signed)
Pt presents with rash on eyebrow with facial pain around right eye

## 2019-04-17 NOTE — Discharge Instructions (Addendum)
Rest and use ice/heat as needed for symptomatic relief Prescribed valacyclovir 1000mg 3x/day for 10 days Prescribed prednisone taper for inflammation and pain Use OTC medications such as ibuprofen/ tylenol.  Follow up with PCP in 7-10 days if rash is still present Follow up with PCP if symptoms of burning, stinging, tingling or numbness occur after rash resolves, you may need additional treatment Return here or go to ER if you have any new or worsening symptoms (such as eye involvement, severe pain, or signs of secondary infection such as fever, chills, nausea, vomiting, discharge, redness or warmth over site of rash) 

## 2019-04-17 NOTE — ED Provider Notes (Signed)
[facility]   QN:[id number] 04/17/19 Arrival Time: 1207  CC: Rash  SUBJECTIVE:  Cole Brown is a 73 y.o. male who presents with a rash above RT eyebrow x 2 days.  Reports increased sensitivity/ tenderness over RT side of forehead and top of head for about a week.  Denies precipitating event or trauma.  Localizes the rash to over/on right eyebrow.  Describes it as mildly painful, itching, and burning in character.  Has tried OTC antibacterial and steroid creams without relief.  Symptoms are made worse to the touch.  Denies similar symptoms in the past.   Denies fever, chills, eye pain, vision changes, blurred vision, painful eye movements, nausea, vomiting, erythema, swelling, discharge, SOB, chest pain, abdominal pain, changes in bowel or bladder function.    ROS: As per HPI.  All other pertinent ROS negative.     Past Medical History:  Diagnosis Date  . Diverticula of colon    2016 colonoscopy  . Fibromyalgia   . Fibromyalgia   . History of GI diverticular bleed   . Hypercholesteremia   . Hypertension    Past Surgical History:  Procedure Laterality Date  . allergy shots  weekly  . CHOLECYSTECTOMY    . COLONOSCOPY     Dr.Rehman  . COLONOSCOPY N/A 01/27/2015   Procedure: COLONOSCOPY;  Surgeon: Rogene Houston, MD;  [facility]: [facility];  Service: Endoscopy;  Laterality: N/A;  930  . EYE SURGERY Bilateral    partial cornea transplants   . GALLBLADDER SURGERY  12/1993  . UPPER GASTROINTESTINAL ENDOSCOPY     Allergies  Allergen Reactions  . Plaquenil [Hydroxychloroquine Sulfate]     rash  . Lodine [Etodolac] Swelling and Rash   No current facility-administered medications on file prior to encounter.    Current Outpatient Medications on File Prior to Encounter  Medication Sig Dispense Refill  . amLODipine (NORVASC) 5 MG tablet Take 1 tablet (5 mg total) by mouth daily. 90 tablet 3  . atenolol (TENORMIN) 25 MG tablet TAKE (1) TABLET BY MOUTH ONCE DAILY. 90  tablet 3  . Azelastine-Fluticasone (DYMISTA) 137-50 MCG/ACT SUSP Place into the nose 2 (two) times daily.     . diphenhydrAMINE (BENADRYL) 25 MG tablet Take 25 mg by mouth as needed for allergies.     . Ferrous Sulfate (SLOW FE PO) Take 1 tablet by mouth daily.    Marland Kitchen FLUoxetine (PROZAC) 10 MG tablet Take 10 mg by mouth daily.    . fluticasone (FLONASE ALLERGY RELIEF) 50 MCG/ACT nasal spray Place 2 sprays into both nostrils daily as needed for allergies or rhinitis.     . folic acid (FOLVITE) 1 MG tablet Take 2 tablets (2 mg total) by mouth daily. 180 tablet 3  . levocetirizine (XYZAL) 5 MG tablet Take 5 mg by mouth every evening.     Marland Kitchen losartan (COZAAR) 50 MG tablet Take 100 mg by mouth every morning.     . magic mouthwash SOLN Take 5 mLs by mouth 4 (four) times daily. (Patient not taking: Reported on 02/05/2019) 240 mL 0  . methotrexate (RHEUMATREX) 2.5 MG tablet TAKE 8 TABLETS BY MOUTH ONCE WEEKLY. 96 tablet 0  . montelukast (SINGULAIR) 10 MG tablet Take 10 mg by mouth at bedtime.    . Omega-3 Fatty Acids (FISH OIL) 1000 MG CAPS Take 1 capsule by mouth 2 (two) times daily.    . prednisoLONE acetate (PRED FORTE) 1 % ophthalmic suspension Place 1 drop into the left eye daily.    Marland Kitchen  ranitidine (ZANTAC) 75 MG tablet Take 75 mg by mouth daily as needed for heartburn.     . triazolam (HALCION) 0.25 MG tablet Take 0.125 mg by mouth at bedtime.      Social History   Socioeconomic History  . Marital status: Married    Spouse name: Not on file  . Number of children: Not on file  . Years of education: Not on file  . Highest education level: Not on file  Occupational History  . Not on file  Social Needs  . Financial resource strain: Not on file  . Food insecurity    Worry: Not on file    Inability: Not on file  . Transportation needs    Medical: Not on file    Non-medical: Not on file  Tobacco Use  . Smoking status: Never Smoker  . Smokeless tobacco: Never Used  Substance and Sexual Activity   . Alcohol use: Yes    Alcohol/week: 0.0 standard drinks    Comment: occas  . Drug use: No  . Sexual activity: Not on file  Lifestyle  . Physical activity    Days per week: Not on file    Minutes per session: Not on file  . Stress: Not on file  Relationships  . Social Herbalist on phone: Not on file    Gets together: Not on file    Attends religious service: Not on file    Active member of club or organization: Not on file    Attends meetings of clubs or organizations: Not on file    Relationship status: Not on file  . Intimate partner violence    Fear of current or ex partner: Not on file    Emotionally abused: Not on file    Physically abused: Not on file    Forced sexual activity: Not on file  Other Topics Concern  . Not on file  Social History Narrative  . Not on file   Family History  Problem Relation Age of Onset  . Heart disease Mother   . Pancreatic cancer Mother   . Prostate cancer Father   . Healthy Sister   . Parkinson's disease Brother   . Asthma Sister   . Healthy Sister     OBJECTIVE: Vitals:   04/17/19 1218  BP: 123/76  Pulse: (!) 55  Resp: 18  Temp: 97.9 F (36.6 C)  SpO2: 97%    General appearance: alert; no distress Head: NCAT; no obvious vesicles in scalp ENT: PERRL, EOMI grossly (declines fluorescein eye exam); EACs clear, TMs pearly gray; nares patent, no rhinorrhea; oropharynx clear, tonsils not enlarged or erythematous, uvula midline; no other obvious vesicles on ENT exam Lungs: clear to auscultation bilaterally; normal respiratory effort Heart: regular rate and rhythm.   Extremities: no edema Skin: warm and dry; Crops of erythematous vesicles over V1 dermatome on RT side; no obvious bleeding or discharge Psychological: alert and cooperative; normal mood and affect  ASSESSMENT & PLAN:  1. Herpes zoster without complication     Meds ordered this encounter  Medications  . valACYclovir (VALTREX) 1000 MG tablet    Sig:  Take 1 tablet (1,000 mg total) by mouth 3 (three) times daily for 10 days.    Dispense:  30 tablet    Refill:  0    Order Specific Question:   Supervising Provider    Answer:   Raylene Everts [id number]  . predniSONE (STERAPRED UNI-PAK 21 TAB) 10 MG (21) TBPK  tablet    Sig: Take by mouth daily. Take 6 tabs by mouth daily  for 2 days, then 5 tabs for 2 days, then 4 tabs for 2 days, then 3 tabs for 2 days, 2 tabs for 2 days, then 1 tab by mouth daily for 2 days    Dispense:  42 tablet    Refill:  0    Order Specific Question:   Supervising Provider    Answer:   Raylene Everts [id number]   Rest and use ice/heat as needed for symptomatic relief Prescribed valacyclovir 1000mg  3x/day for 10 days Prescribed prednisone taper for inflammation and pain Use OTC medications such as ibuprofen/ tylenol.  Follow up with PCP in 7-10 days if rash is still present Follow up with PCP if symptoms of burning, stinging, tingling or numbness occur after rash resolves, you may need additional treatment Return here or go to ER if you have any new or worsening symptoms (such as eye involvement, severe pain, or signs of secondary infection such as fever, chills, nausea, vomiting, discharge, redness or warmth over site of rash)    Reviewed expectations re: course of current medical issues. Questions answered. Outlined signs and symptoms indicating need for more acute intervention. Patient verbalized understanding. After Visit Summary given.   Lestine Box, PA-C 04/17/19 1250

## 2019-04-21 ENCOUNTER — Telehealth: Payer: Self-pay | Admitting: Rheumatology

## 2019-04-21 NOTE — Telephone Encounter (Signed)
There is no interaction with methotrexate.  Although patient should hold methotrexate until the shingles is better.  Once the shingles lesions try obtain he can restart methotrexate.  As methotrexate can delay the recovery from shingles.

## 2019-04-21 NOTE — Telephone Encounter (Signed)
Patient called stating he was diagnosed with Shingles on 04/17/19 and prescribed Valtrex and Prednisone.  Patient is requesting a return call to make sure there is no interaction with his Methotrexate.

## 2019-04-21 NOTE — Telephone Encounter (Signed)
Patient advised there is no interaction with methotrexate.  Although patient should hold methotrexate until the shingles is better.  Once the shingles lesions try obtain he can restart methotrexate.  As methotrexate can delay the recovery from shingles.

## 2019-04-22 DIAGNOSIS — E663 Overweight: Secondary | ICD-10-CM | POA: Diagnosis not present

## 2019-04-22 DIAGNOSIS — Z6827 Body mass index (BMI) 27.0-27.9, adult: Secondary | ICD-10-CM | POA: Diagnosis not present

## 2019-04-22 DIAGNOSIS — B029 Zoster without complications: Secondary | ICD-10-CM | POA: Diagnosis not present

## 2019-04-27 ENCOUNTER — Ambulatory Visit (INDEPENDENT_AMBULATORY_CARE_PROVIDER_SITE_OTHER): Payer: Medicare HMO | Admitting: Nurse Practitioner

## 2019-05-01 ENCOUNTER — Ambulatory Visit
Admission: EM | Admit: 2019-05-01 | Discharge: 2019-05-01 | Disposition: A | Discharge status: Home / Self Care | Payer: Medicare HMO | Attending: Emergency Medicine | Admitting: Emergency Medicine

## 2019-05-01 ENCOUNTER — Other Ambulatory Visit: Payer: Self-pay

## 2019-05-01 DIAGNOSIS — K146 Glossodynia: Secondary | ICD-10-CM

## 2019-05-01 NOTE — ED Provider Notes (Signed)
[facility]    CSN: [id number] Arrival date & time: 05/01/19  1102      History   Chief Complaint Chief Complaint  Patient presents with  . raw tongue    HPI Cole Brown is a 73 y.o. male.   Pt presents to UC w/ c/o tongue feeling raw x1 week. Pt states he was seen here for shingles 2 weeks ago, started on a medication and started having raw tongue over a week ago.  He stated he completed the medication. The symptoms are getting worse and hasn't use any medications. Nothing make it worse or better and is unaware of any foods that may cause the reaction  The history is provided by the patient. No language interpreter was used.    Past Medical History:  Diagnosis Date  . Diverticula of colon    2016 colonoscopy  . Fibromyalgia   . Fibromyalgia   . History of GI diverticular bleed   . Hypercholesteremia   . Hypertension     Patient Active Problem List   Diagnosis Date Noted  . Autoimmune disease ([place]) 09/15/2018  . 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 Hypercholesteremia 08/21/2018  . History of diverticulitis 08/21/2018  . History of iron deficiency anemia 08/21/2018  . 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 Fibromyalgia 08/21/2018  . History of multiple allergies 08/21/2018  . History of sleep apnea 08/21/2018  . Rectal bleed 11/14/2016  . Essential hypertension 11/14/2016    Past Surgical History:  Procedure Laterality Date  . allergy shots  weekly  . CHOLECYSTECTOMY    . COLONOSCOPY     Dr.Rehman  . COLONOSCOPY N/A 01/27/2015   Procedure: COLONOSCOPY;  Surgeon: Rogene Houston, MD;  Location: [facility];  Service: Endoscopy;  Laterality: N/A;  930  . EYE SURGERY Bilateral    partial cornea transplants   . GALLBLADDER SURGERY  12/1993  . UPPER GASTROINTESTINAL ENDOSCOPY         Home Medications    Prior to Admission medications   Medication Sig Start Date End Date Taking? Authorizing Provider  amLODipine (NORVASC) 5  MG tablet Take 1 tablet (5 mg total) by mouth daily. 01/30/19 04/30/19  Arnoldo Lenis, MD  atenolol (TENORMIN) 25 MG tablet TAKE (1) TABLET BY MOUTH ONCE DAILY. 09/22/18   Arnoldo Lenis, MD  Azelastine-Fluticasone [facility]) 137-50 MCG/ACT SUSP Place into the nose 2 (two) times daily.     [provider]  diphenhydrAMINE (BENADRYL) 25 MG tablet Take 25 mg by mouth as needed for allergies.     [provider]  Ferrous Sulfate (SLOW FE PO) Take 1 tablet by mouth daily.    [provider]  FLUoxetine (PROZAC) 10 MG tablet Take 10 mg by mouth daily.    [provider]  fluticasone (FLONASE ALLERGY RELIEF) 50 MCG/ACT nasal spray Place 2 sprays into both nostrils daily as needed for allergies or rhinitis.     [provider]  folic acid (FOLVITE) 1 MG tablet Take 2 tablets (2 mg total) by mouth daily. 11/06/18   Bo Merino, MD  levocetirizine (XYZAL) 5 MG tablet Take 5 mg by mouth every evening.     [provider]  losartan (COZAAR) 50 MG tablet Take 100 mg by mouth every morning.     [provider]  magic mouthwash SOLN Take 5 mLs by mouth 4 (four) times daily. Patient not taking: Reported on 02/05/2019 12/23/18   Ofilia Neas, PA-C  methotrexate (  RHEUMATREX) 2.5 MG tablet TAKE 8 TABLETS BY MOUTH ONCE WEEKLY. 03/05/19   Bo Merino, MD  montelukast (SINGULAIR) 10 MG tablet Take 10 mg by mouth at bedtime.    [provider]  Omega-3 Fatty Acids (FISH OIL) 1000 MG CAPS Take 1 capsule by mouth 2 (two) times daily.    [provider]  prednisoLONE acetate (PRED FORTE) 1 % ophthalmic suspension Place 1 drop into the left eye daily.    [provider]  predniSONE (STERAPRED UNI-PAK 21 TAB) 10 MG (21) TBPK tablet Take by mouth daily. Take 6 tabs by mouth daily  for 2 days, then 5 tabs for 2 days, then 4 tabs for 2 days, then 3 tabs for 2 days, 2 tabs for 2 days, then 1 tab by mouth daily for 2 days  04/17/19   Stacey Drain, [place], PA-C  ranitidine (ZANTAC) 75 MG tablet Take 75 mg by mouth daily as needed for heartburn.     [provider]  triazolam (HALCION) 0.25 MG tablet Take 0.125 mg by mouth at bedtime.     [provider]    Family History Family History  Problem Relation Age of Onset  . Heart disease Mother   . Pancreatic cancer Mother   . Prostate cancer Father   . Healthy Sister   . Parkinson's disease Brother   . Asthma Sister   . Healthy Sister     Social History Social History   Tobacco Use  . Smoking status: Never Smoker  . Smokeless tobacco: Never Used  Substance Use Topics  . Alcohol use: Yes    Alcohol/week: 0.0 standard drinks    Comment: occas  . Drug use: No     Allergies   Plaquenil [hydroxychloroquine sulfate] and Lodine [etodolac]   Review of Systems Review of Systems  Constitutional: Negative for activity change, appetite change, chills, fatigue and fever.  HENT: Negative for sore throat, trouble swallowing and voice change.   Respiratory: Negative for cough, shortness of breath and wheezing.   Cardiovascular: Negative for chest pain and leg swelling.  Gastrointestinal: Negative for abdominal pain, constipation, diarrhea, nausea and vomiting.  Allergic/Immunologic: Negative for food allergies.  ROS:All other are negatives   Physical Exam Triage Vital Signs ED Triage Vitals  Enc Vitals Group     BP 05/01/19 1130 127/74     Pulse Rate 05/01/19 1130 62     Resp 05/01/19 1130 16     Temp --      Temp src --      SpO2 05/01/19 1130 98 %     Weight --      Height --      Head Circumference --      Peak Flow --      Pain Score 05/01/19 1134 5     Pain Loc --      Pain Edu? --      Excl. in [place]? --    No data found.  Updated Vital Signs BP 127/74 (BP Location: Right Arm)   Pulse 62   Resp 16   SpO2 98%   Visual Acuity Right Eye Distance:   Left Eye Distance:   Bilateral Distance:    Right Eye Near:   Left  Eye Near:    Bilateral Near:     Physical Exam Vitals signs and nursing note reviewed.  Constitutional:      General: He is not in acute distress.    Appearance: Normal appearance. He is normal  weight. He is not ill-appearing or toxic-appearing.  HENT:     Head:     Salivary Glands: Right salivary gland is not diffusely enlarged. Left salivary gland is not diffusely enlarged.     Mouth/Throat:     Mouth: Mucous membranes are moist.     Tongue: No lesions. Tongue does not deviate from midline.     Palate: No mass and lesions.     Pharynx: Oropharynx is clear. Uvula midline. No oropharyngeal exudate, posterior oropharyngeal erythema or uvula swelling.     Tonsils: No tonsillar exudate or tonsillar abscesses.  Cardiovascular:     Rate and Rhythm: Normal rate and regular rhythm.     Pulses: Normal pulses.     Heart sounds: Normal heart sounds. No murmur. No gallop.   Pulmonary:     Effort: Pulmonary effort is normal. No respiratory distress.     Breath sounds: Normal breath sounds. No wheezing.  Chest:     Chest wall: No tenderness.  Abdominal:     General: Abdomen is flat. Bowel sounds are normal.  Neurological:     Mental Status: He is alert.      UC Treatments / Results  Labs (all labs ordered are listed, but only abnormal results are displayed) Labs Reviewed - No data to display  EKG   Radiology No results found.  Procedures Procedures (including critical care time)  Medications Ordered in UC Medications - No data to display  Initial Impression / Assessment and Plan / UC Course  I have reviewed the triage vital signs and the nursing notes.  Pertinent labs & imaging results that were available during my care of the patient were reviewed by me and considered in my medical decision making (see chart for details).   Patient is in stable condition and in no acute distress. Tongue tenderness may be due to an allergic reaction and patient was advised to continue his  allergic medations and to follow up with his PCP and dental. Final Clinical Impressions(s) / UC Diagnoses   Final diagnoses:  Painful tongue     Discharge Instructions     Rest push fluids Return to be evaluated if symptom are getting worse Continue to take your levocetirizine as prescribed for your allergy Return sooner or go to the ED if you have any new or worsening symptoms such as difficulty breathing, shortness of breath, chest pain, nausea, vomiting, throat tightness or swelling, tongue swelling or tingling, worsening lip or facial swelling, abdominal pain, changes in bowel or bladder habits, no improvement despite medications, etc...    ED Prescriptions    None     PDMP not reviewed this encounter.   Emerson Monte, [place] 05/01/19 1216

## 2019-05-01 NOTE — ED Triage Notes (Signed)
Pt presents to UC w/ c/o tongue feeling raw x1 week. Pt states he was seen here for shingles 2 weeks ago, started on a medication and started having raw tongue over a week ago.

## 2019-05-01 NOTE — Discharge Instructions (Signed)
Rest push fluids Return to be evaluated if symptom are getting worse Continue to take your levocetirizine as prescribed for your allergy Return sooner or go to the ED if you have any new or worsening symptoms such as difficulty breathing, shortness of breath, chest pain, nausea, vomiting, throat tightness or swelling, tongue swelling or tingling, worsening lip or facial swelling, abdominal pain, changes in bowel or bladder habits, no improvement despite medications, etc..Marland Kitchen

## 2019-05-08 DIAGNOSIS — E291 Testicular hypofunction: Secondary | ICD-10-CM | POA: Diagnosis not present

## 2019-05-25 DIAGNOSIS — Z961 Presence of intraocular lens: Secondary | ICD-10-CM | POA: Diagnosis not present

## 2019-05-25 DIAGNOSIS — Z9889 Other specified postprocedural states: Secondary | ICD-10-CM | POA: Diagnosis not present

## 2019-05-25 DIAGNOSIS — Z9842 Cataract extraction status, left eye: Secondary | ICD-10-CM | POA: Diagnosis not present

## 2019-05-25 DIAGNOSIS — Z9841 Cataract extraction status, right eye: Secondary | ICD-10-CM | POA: Diagnosis not present

## 2019-05-25 DIAGNOSIS — G4733 Obstructive sleep apnea (adult) (pediatric): Secondary | ICD-10-CM | POA: Diagnosis not present

## 2019-05-26 ENCOUNTER — Other Ambulatory Visit: Payer: Self-pay | Admitting: Rheumatology

## 2019-05-26 DIAGNOSIS — Z79899 Other long term (current) drug therapy: Secondary | ICD-10-CM

## 2019-05-26 DIAGNOSIS — M359 Systemic involvement of connective tissue, unspecified: Secondary | ICD-10-CM

## 2019-05-26 NOTE — Telephone Encounter (Signed)
Last Visit: 02/05/2019 Next Visit: 07/09/2019 Labs: 12/23/2018 stable   Advised patient he is due to update labs, patient verbalized understanding and [facility] [place] [facility]. Orders have been released.   Okay to refill 30 day supply, per Dr. Estanislado Pandy.

## 2019-05-27 LAB — COMPLETE METABOLIC PANEL WITH GFR
AG Ratio: 1.4 (calc) (ref 1.0–2.5)
ALT: 16 U/L (ref 9–46)
AST: 16 U/L (ref 10–35)
Albumin: 3.8 g/dL (ref 3.6–5.1)
Alkaline phosphatase (APISO): 95 U/L (ref 35–144)
BUN: 17 mg/dL (ref 7–25)
CO2: 26 mmol/L (ref 20–32)
Calcium: 9.4 mg/dL (ref 8.6–10.3)
Chloride: 99 mmol/L (ref 98–110)
Creat: 0.93 mg/dL (ref 0.70–1.18)
GFR, Est African American: 93 mL/min/{1.73_m2} (ref >=60)
GFR, Est Non African American: 80 mL/min/{1.73_m2} (ref >=60)
Globulin: 2.8 g/dL (calc) (ref 1.9–3.7)
Glucose, Bld: 102 mg/dL (ref 65–139)
Potassium: 4.1 mmol/L (ref 3.5–5.3)
Sodium: 134 mmol/L — ABNORMAL LOW (ref 135–146)
Total Bilirubin: 0.5 mg/dL (ref 0.2–1.2)
Total Protein: 6.6 g/dL (ref 6.1–8.1)

## 2019-05-27 LAB — CBC WITH DIFFERENTIAL/PLATELET
Absolute Monocytes: 591 cells/uL (ref 200–950)
Basophils Absolute: 58 cells/uL (ref 0–200)
Basophils Relative: 0.8 %
Eosinophils Absolute: 102 cells/uL (ref 15–500)
Eosinophils Relative: 1.4 %
HCT: 37.4 % — ABNORMAL LOW (ref 38.5–50.0)
Hemoglobin: 12.8 g/dL — ABNORMAL LOW (ref 13.2–17.1)
Lymphs Abs: 1482 cells/uL (ref 850–3900)
MCH: 31.5 pg (ref 27.0–33.0)
MCHC: 34.2 g/dL (ref 32.0–36.0)
MCV: 92.1 fL (ref 80.0–100.0)
MPV: 9.4 fL (ref 7.5–12.5)
Monocytes Relative: 8.1 %
Neutro Abs: 5066 cells/uL (ref 1500–7800)
Neutrophils Relative %: 69.4 %
Platelets: 249 10*3/uL (ref 140–400)
RBC: 4.06 10*6/uL — ABNORMAL LOW (ref 4.20–5.80)
RDW: 15.3 % — ABNORMAL HIGH (ref 11.0–15.0)
Total Lymphocyte: 20.3 %
WBC: 7.3 10*3/uL (ref 3.8–10.8)

## 2019-05-27 NOTE — Telephone Encounter (Signed)
CBC and CMP are stable.

## 2019-06-05 DIAGNOSIS — R05 Cough: Secondary | ICD-10-CM | POA: Diagnosis not present

## 2019-06-05 DIAGNOSIS — J3089 Other allergic rhinitis: Secondary | ICD-10-CM | POA: Diagnosis not present

## 2019-06-05 DIAGNOSIS — J301 Allergic rhinitis due to pollen: Secondary | ICD-10-CM | POA: Diagnosis not present

## 2019-06-05 DIAGNOSIS — J3081 Allergic rhinitis due to animal (cat) (dog) hair and dander: Secondary | ICD-10-CM | POA: Diagnosis not present

## 2019-06-10 DIAGNOSIS — J3081 Allergic rhinitis due to animal (cat) (dog) hair and dander: Secondary | ICD-10-CM | POA: Diagnosis not present

## 2019-06-10 DIAGNOSIS — J3089 Other allergic rhinitis: Secondary | ICD-10-CM | POA: Diagnosis not present

## 2019-06-10 DIAGNOSIS — J301 Allergic rhinitis due to pollen: Secondary | ICD-10-CM | POA: Diagnosis not present

## 2019-06-15 DIAGNOSIS — J3081 Allergic rhinitis due to animal (cat) (dog) hair and dander: Secondary | ICD-10-CM | POA: Diagnosis not present

## 2019-06-15 DIAGNOSIS — J301 Allergic rhinitis due to pollen: Secondary | ICD-10-CM | POA: Diagnosis not present

## 2019-06-15 DIAGNOSIS — J3089 Other allergic rhinitis: Secondary | ICD-10-CM | POA: Diagnosis not present

## 2019-06-18 DIAGNOSIS — I10 Essential (primary) hypertension: Secondary | ICD-10-CM | POA: Diagnosis not present

## 2019-06-18 DIAGNOSIS — E7849 Other hyperlipidemia: Secondary | ICD-10-CM | POA: Diagnosis not present

## 2019-06-22 DIAGNOSIS — J3081 Allergic rhinitis due to animal (cat) (dog) hair and dander: Secondary | ICD-10-CM | POA: Diagnosis not present

## 2019-06-22 DIAGNOSIS — J3089 Other allergic rhinitis: Secondary | ICD-10-CM | POA: Diagnosis not present

## 2019-06-22 DIAGNOSIS — J301 Allergic rhinitis due to pollen: Secondary | ICD-10-CM | POA: Diagnosis not present

## 2019-06-25 DIAGNOSIS — J3081 Allergic rhinitis due to animal (cat) (dog) hair and dander: Secondary | ICD-10-CM | POA: Diagnosis not present

## 2019-06-25 DIAGNOSIS — J3089 Other allergic rhinitis: Secondary | ICD-10-CM | POA: Diagnosis not present

## 2019-06-29 ENCOUNTER — Other Ambulatory Visit: Payer: Self-pay | Admitting: Rheumatology

## 2019-06-29 DIAGNOSIS — J3089 Other allergic rhinitis: Secondary | ICD-10-CM | POA: Diagnosis not present

## 2019-06-29 DIAGNOSIS — J301 Allergic rhinitis due to pollen: Secondary | ICD-10-CM | POA: Diagnosis not present

## 2019-06-29 DIAGNOSIS — M359 Systemic involvement of connective tissue, unspecified: Secondary | ICD-10-CM

## 2019-06-29 DIAGNOSIS — J3081 Allergic rhinitis due to animal (cat) (dog) hair and dander: Secondary | ICD-10-CM | POA: Diagnosis not present

## 2019-06-29 NOTE — Telephone Encounter (Signed)
Last Visit: 02/05/19 Next Visit: 07/08/18 Labs: 05/26/19 stable  Okay to refill per Dr. Estanislado Pandy

## 2019-07-06 ENCOUNTER — Other Ambulatory Visit: Payer: Self-pay

## 2019-07-06 ENCOUNTER — Ambulatory Visit (INDEPENDENT_AMBULATORY_CARE_PROVIDER_SITE_OTHER): Payer: Medicare HMO | Admitting: Internal Medicine

## 2019-07-06 ENCOUNTER — Encounter (INDEPENDENT_AMBULATORY_CARE_PROVIDER_SITE_OTHER): Payer: Self-pay | Admitting: Internal Medicine

## 2019-07-06 DIAGNOSIS — R1030 Lower abdominal pain, unspecified: Secondary | ICD-10-CM | POA: Diagnosis not present

## 2019-07-06 MED ORDER — DICYCLOMINE HCL 10 MG PO CAPS: 10.0000 mg | ORAL_CAPSULE | Freq: Every day | ORAL | 1 refills | Status: DC

## 2019-07-06 NOTE — Patient Instructions (Signed)
Please call office with progress report in 2 to 3 weeks. Keep symptom diary as discussed until then. He can stop dicyclomine if you experience any side effects.

## 2019-07-06 NOTE — Progress Notes (Signed)
Virtual Visit via Telephone Note  I connected with Cole Brown on 07/09/19 at 10:30 AM EST by telephone and verified that I am speaking with the correct person using two identifiers.  Location: Patient: Home  Provider: Clinic  This service was conducted via virtual visit.  The patient was located at home. I was located in my office.  Consent was obtained prior to the virtual visit and is aware of possible charges through their insurance for this visit.  The patient is an established patient.  Dr. Estanislado Pandy, MD conducted the virtual visit and Hazel Sams, PA-C acted as scribe during the service.  Office staff helped with scheduling follow up visits after the service was conducted.     I discussed the limitations, risks, security and privacy concerns of performing an evaluation and management service by telephone and the availability of in person appointments. I also discussed with the patient that there may be a patient responsible charge related to this service. The patient expressed understanding and agreed to proceed.  CC: Medication monitoring  History of Present Illness: Cole Brown is a 74 y.o. male with history of autoimmune disease and fibromyalgia.  He is taking methotrexate 8 tablets by mouth once weekly and folic acid 2 mg po daily. He takes aspirin 81 mg po daily. He denies any signs or symptoms of a flare.  He denies any joint swelling.  He states his fibromyalgia pain has been manageable. He has chronic, mild sicca symptoms.  He denies any oral or nasal ulcerations.  He has not had any recent rashes or photosensitivity.  He denies any enlarged lymph nodes.  Review of Systems  Constitutional: Negative for fever and malaise/fatigue.  HENT: Negative for ear pain.        +Dry mouth  Eyes: Negative for photophobia, pain, discharge and redness.       +Dry eyes  Respiratory: Negative for cough, shortness of breath and wheezing.   Cardiovascular: Negative for chest pain and  palpitations.  Gastrointestinal: Negative for blood in stool, constipation and diarrhea.  Genitourinary: Negative for dysuria and urgency.  Musculoskeletal: Positive for joint pain. Negative for back pain, myalgias and neck pain.  Skin: Negative for rash.       Denies photosensitivity   Neurological: Negative for dizziness, weakness and headaches.  Endo/Heme/Allergies: Bruises/bleeds easily.  Psychiatric/Behavioral: Negative for depression. The patient is not nervous/anxious and does not have insomnia.      Observations/Objective:  Physical Exam  Constitutional: He is oriented to person, place, and time.  Neurological: He is alert and oriented to person, place, and time.  Psychiatric: Mood, memory, affect and judgment normal.     Patient reports morning stiffness for 0  minutes.   Patient reports nocturnal pain.  Difficulty dressing/grooming: Denies Difficulty climbing stairs: Denies Difficulty getting out of chair: Denies Difficulty using hands for taps, buttons, cutlery, and/or writing: Denies  Assessment and Plan: Visit Diagnoses: Autoimmune disease ([place]) - ANA negative ENA negative, [name] phenomenon, beta-2 GP 1 IgM positive, anticardiolipin IgM positive, elevated ESR, severe inflammatory arthritis: He has not had any signs or symptoms of a flare recently.  He is clinically doing well on Methotrexate 8 tablets by mouth once weekly and folic acid 2 mg po daily.  He has no joint pain or inflammation at this time.  He has not had any recent rashes, photosensitivity, oral or nasal ulcerations, enlarged lymph nodes.  He has mild, chronic sicca symptoms and intermittent symptoms of [name].  He continues  to take aspirin 81 mg po daily.  He will continue taking MTX as prescribed.  We will obtain Beta-2, LA, and cardiolipin antibodies with his upcoming lab work in March and obtain [facility] labs at his follow up visit in 3-4 months.   High risk medication use - MTX 8 tablets po once  weekly, folic acid 2 mg po daily.  D/c PLQ-rash.  CBC and CMP were drawn on 05/26/19.  He will be due to update lab work in March and every 3 months.    [name] disease without gangrene: He has intermittent symptoms of [name].  No digital ulcerations or signs of gangrene.  He continues to take aspirin 81 mg po daily.    Fibromyalgia: His generalized muscle aches and muscle tenderness have been manageable recently.   Other medical problems are listed as follows:  Essential hypertension  History of diverticulitis  History of iron deficiency anemia  History of multiple allergies  History of sleep apnea  Hypercholesteremia  Fuchs' corneal dystrophy  Follow Up Instructions: He will follow up in 3-4 months   I discussed the assessment and treatment plan with the patient. The patient was provided an opportunity to ask questions and all were answered. The patient agreed with the plan and demonstrated an understanding of the instructions.   The patient was advised to call back or seek an in-person evaluation if the symptoms worsen or if the condition fails to improve as anticipated.  I provided 15 minutes of non-face-to-face time during this encounter.  Bo Merino, MD   Scribed by-  Hazel Sams, PA-C

## 2019-07-06 NOTE — Progress Notes (Signed)
Presenting complaint;  Lower abdominal pain.  Database and subjective:  Patient is 74 year old Caucasian male who has a history of diverticular bleed back in June 2018 requiring brief hospitalization.  He did not require transfusion or endoscopic intervention.  Last colonoscopy was in August 2017 revealing multiple diverticula at sigmoid colon and external hemorrhoids. He was last seen in the office in November 2019 and was doing well.  Today patient presents with lower abdominal pain.  He states he has had lower abdominal pain off and on for a few years but it has been sporadic and very mild.  However he has noted change in the last 3 months.  He has this pain at least once a week if not more often.  Pain starts in right lower quadrant and radiates to the left and involves lower half of the abdomen.  Pain is not sharp or crampy.  It is dull aching pain which may last for as long as 6 hours but never all day long.  He may notice some relief with defecation.  This pain is not associated with fever chills nausea or vomiting.  He feels his bowels are sluggish.  He is taking magnesium tablets once a week.  He denies melena or rectal bleeding.  His appetite is good and his weight has been stable.  He walks for 20 minutes or more every day weather permitting. He states he stopped iron in October 2020 because he felt was making him constipated. He was diagnosed with rheumatoid arthritis about a year ago and he is under care of Dr. Ceasar Lund and has been on methotrexate.  He does not feel that he is having any side effects. He had blood work about 5 weeks ago.  Current Medications: Outpatient Encounter Medications as of 07/06/2019  Medication Sig  . amLODipine (NORVASC) 5 MG tablet Take 1 tablet (5 mg total) by mouth daily.  Marland Kitchen atenolol (TENORMIN) 25 MG tablet TAKE (1) TABLET BY MOUTH ONCE DAILY.  Marland Kitchen Azelastine-Fluticasone (DYMISTA) 137-50 MCG/ACT SUSP Place into the nose 2 (two) times daily.   .  diphenhydrAMINE (BENADRYL) 25 MG tablet Take 25 mg by mouth as needed for allergies.   . Ferrous Sulfate (SLOW FE PO) Take 1 tablet by mouth daily.  Marland Kitchen FLUoxetine (PROZAC) 10 MG tablet Take 10 mg by mouth daily.  . folic acid (FOLVITE) 1 MG tablet Take 2 tablets (2 mg total) by mouth daily.  Marland Kitchen levocetirizine (XYZAL) 5 MG tablet Take 5 mg by mouth every evening.   Marland Kitchen losartan (COZAAR) 50 MG tablet Take 100 mg by mouth every morning.   . methotrexate (RHEUMATREX) 2.5 MG tablet TAKE 8 TABLETS BY MOUTH ONCE WEEKLY.  . montelukast (SINGULAIR) 10 MG tablet Take 10 mg by mouth at bedtime.  . Omega-3 Fatty Acids (FISH OIL) 1000 MG CAPS Take 1 capsule by mouth 2 (two) times daily.  . triazolam (HALCION) 0.25 MG tablet Take 0.125 mg by mouth at bedtime.   . [DISCONTINUED] fluticasone (FLONASE ALLERGY RELIEF) 50 MCG/ACT nasal spray Place 2 sprays into both nostrils daily as needed for allergies or rhinitis.   . [DISCONTINUED] magic mouthwash SOLN Take 5 mLs by mouth 4 (four) times daily. (Patient not taking: Reported on 02/05/2019)  . [DISCONTINUED] prednisoLONE acetate (PRED FORTE) 1 % ophthalmic suspension Place 1 drop into the left eye daily.  . [DISCONTINUED] predniSONE (STERAPRED UNI-PAK 21 TAB) 10 MG (21) TBPK tablet Take by mouth daily. Take 6 tabs by mouth daily  for 2 days, then  5 tabs for 2 days, then 4 tabs for 2 days, then 3 tabs for 2 days, 2 tabs for 2 days, then 1 tab by mouth daily for 2 days (Patient not taking: Reported on 07/06/2019)  . [DISCONTINUED] ranitidine (ZANTAC) 75 MG tablet Take 75 mg by mouth daily as needed for heartburn.    No facility-administered encounter medications on file as of 07/06/2019.   Past Medical History:  Diagnosis Date  . Diverticula of colon    2016 colonoscopy  . Fibromyalgia   . Fibromyalgia   . History of GI diverticular bleed   . Hypercholesteremia   . Hypertension        Chronic insomnia.  Past Surgical History:  Procedure Laterality Date  .  allergy shots  weekly  . CHOLECYSTECTOMY    . COLONOSCOPY     Dr.Buena Boehm  . COLONOSCOPY N/A 01/27/2015   Procedure: COLONOSCOPY;  Surgeon: Cole Houston, MD;  Location: [facility];  Service: Endoscopy;  Laterality: N/A;  930  . EYE SURGERY Bilateral    partial cornea transplants   . GALLBLADDER SURGERY in 5697 complicated by bleeding requiring transfusion and laparotomy.  12/1993  . UPPER GASTROINTESTINAL ENDOSCOPY      Objective: Blood pressure (!) 157/81, pulse (!) 53, temperature 97.6 F (36.4 C), temperature source Temporal, height 5' 11"  (1.803 m), weight 199 lb 6.4 oz (90.4 kg). Patient is alert and in no acute distress. Patient is wearing facial mask. Conjunctiva is pink. Sclera is nonicteric Oropharyngeal mucosa is normal. No neck masses or thyromegaly noted. Cardiac exam with regular rhythm normal S1 and S2. No murmur or gallop noted. Lungs are clear to auscultation. Abdomen is full.  He has small umbilical hernia which is completely reducible.  No cough impulse noted in inguinal areas.  Bowel sounds are normal.  No bruit noted.  On palpation abdomen is soft and nontender with organomegaly or masses. No LE edema or clubbing noted.  Labs/studies Results:   CBC Latest Ref Rng & Units 05/26/2019 12/23/2018 11/28/2018  WBC 3.8 - 10.8 Thousand/uL 7.3 6.9 7.2  Hemoglobin 13.2 - 17.1 g/dL 12.8(L) 12.3(L) 12.6(L)  Hematocrit 38.5 - 50.0 % 37.4(L) 36.7(L) 37.8(L)  Platelets 140 - 400 Thousand/uL 249 252 282    CMP Latest Ref Rng & Units 05/26/2019 12/23/2018 11/28/2018  Glucose 65 - 139 mg/dL 102 95 96  BUN 7 - 25 mg/dL 17 16 17   Creatinine 0.70 - 1.18 mg/dL 0.93 1.08 0.98  Sodium 135 - 146 mmol/L 134(L) 131(L) 130(L)  Potassium 3.5 - 5.3 mmol/L 4.1 4.2 4.3  Chloride 98 - 110 mmol/L 99 98 97(L)  CO2 20 - 32 mmol/L 26 26 24   Calcium 8.6 - 10.3 mg/dL 9.4 9.1 8.9  Total Protein 6.1 - 8.1 g/dL 6.6 7.0 6.7  Total Bilirubin 0.2 - 1.2 mg/dL 0.5 0.5 0.6  Alkaline Phos 38 - 126 U/L - -  -  AST 10 - 35 U/L 16 12 12   ALT 9 - 46 U/L 16 12 11     Hepatic Function Latest Ref Rng & Units 05/26/2019 12/23/2018 11/28/2018  Total Protein 6.1 - 8.1 g/dL 6.6 7.0 6.7  Albumin 3.5 - 5.0 g/dL - - -  AST 10 - 35 U/L 16 12 12   ALT 9 - 46 U/L 16 12 11   Alk Phosphatase 38 - 126 U/L - - -  Total Bilirubin 0.2 - 1.2 mg/dL 0.5 0.5 0.6    Abdominal pelvic CT from 01/29/2013 reviewed Study performed for right  lower quadrant abdominal pain Small fat-containing right inguinal hernia also containing of appendiceal tip.  Assessment:  #1.  Lower abdominal pain originating in right lower quadrant.  While he has had mild pain off and on for few years pain has become more frequent and more noticeable over the last 3 months.  He has no symptoms other than mild constipation.  He has history of fibromyalgia but he does not have abdominal tenderness on exam.  Doubt that this is referred pain from his back.  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  If he does not respond to low-dose antispasmodic would consider repeating CT. He is up-to-date on CRC screening.  Plan:  Patient advised to keep symptom diary for the next 2 to 3 weeks. We will start dicyclomine 10 mg by mouth daily before breakfast.  Patient informed of potential side effects.  If he has constipation other side effects he can stop the medication and let us know. Patient will call office with progress report in 2 to 3 weeks. Will consider abdominal pelvic CT if he does not respond and spasmodic. Office visit on as-needed basis.

## 2019-07-07 DIAGNOSIS — J301 Allergic rhinitis due to pollen: Secondary | ICD-10-CM | POA: Diagnosis not present

## 2019-07-07 DIAGNOSIS — J3081 Allergic rhinitis due to animal (cat) (dog) hair and dander: Secondary | ICD-10-CM | POA: Diagnosis not present

## 2019-07-07 DIAGNOSIS — J3089 Other allergic rhinitis: Secondary | ICD-10-CM | POA: Diagnosis not present

## 2019-07-09 ENCOUNTER — Other Ambulatory Visit: Payer: Self-pay

## 2019-07-09 ENCOUNTER — Telehealth (INDEPENDENT_AMBULATORY_CARE_PROVIDER_SITE_OTHER): Payer: Medicare HMO | Admitting: Rheumatology

## 2019-07-09 ENCOUNTER — Encounter: Payer: Self-pay | Admitting: Rheumatology

## 2019-07-09 DIAGNOSIS — Z862 Personal history of diseases of the blood and blood-forming organs and certain disorders involving the immune mechanism: Secondary | ICD-10-CM

## 2019-07-09 DIAGNOSIS — Z8669 Personal history of other diseases of the nervous system and sense organs: Secondary | ICD-10-CM

## 2019-07-09 DIAGNOSIS — H18519 Endothelial corneal dystrophy, unspecified eye: Secondary | ICD-10-CM

## 2019-07-09 DIAGNOSIS — Z9189 Other specified personal risk factors, not elsewhere classified: Secondary | ICD-10-CM

## 2019-07-09 DIAGNOSIS — Z79899 Other long term (current) drug therapy: Secondary | ICD-10-CM

## 2019-07-09 DIAGNOSIS — Z8719 Personal history of other diseases of the digestive system: Secondary | ICD-10-CM | POA: Diagnosis not present

## 2019-07-09 DIAGNOSIS — I10 Essential (primary) hypertension: Secondary | ICD-10-CM | POA: Diagnosis not present

## 2019-07-09 DIAGNOSIS — M797 Fibromyalgia: Secondary | ICD-10-CM | POA: Diagnosis not present

## 2019-07-09 DIAGNOSIS — Z889 Allergy status to unspecified drugs, medicaments and biological substances status: Secondary | ICD-10-CM

## 2019-07-09 DIAGNOSIS — M359 Systemic involvement of connective tissue, unspecified: Secondary | ICD-10-CM | POA: Diagnosis not present

## 2019-07-09 DIAGNOSIS — E7800 Pure hypercholesterolemia, unspecified: Secondary | ICD-10-CM

## 2019-07-09 DIAGNOSIS — I7300 Raynaud's syndrome without gangrene: Secondary | ICD-10-CM | POA: Diagnosis not present

## 2019-07-13 DIAGNOSIS — J301 Allergic rhinitis due to pollen: Secondary | ICD-10-CM | POA: Diagnosis not present

## 2019-07-13 DIAGNOSIS — J3089 Other allergic rhinitis: Secondary | ICD-10-CM | POA: Diagnosis not present

## 2019-07-13 DIAGNOSIS — J3081 Allergic rhinitis due to animal (cat) (dog) hair and dander: Secondary | ICD-10-CM | POA: Diagnosis not present

## 2019-07-20 ENCOUNTER — Telehealth (INDEPENDENT_AMBULATORY_CARE_PROVIDER_SITE_OTHER): Payer: Self-pay | Admitting: Internal Medicine

## 2019-07-20 DIAGNOSIS — J301 Allergic rhinitis due to pollen: Secondary | ICD-10-CM | POA: Diagnosis not present

## 2019-07-20 DIAGNOSIS — J3089 Other allergic rhinitis: Secondary | ICD-10-CM | POA: Diagnosis not present

## 2019-07-20 DIAGNOSIS — J3081 Allergic rhinitis due to animal (cat) (dog) hair and dander: Secondary | ICD-10-CM | POA: Diagnosis not present

## 2019-07-20 NOTE — Telephone Encounter (Signed)
Patient left voice mail message stating the dicyclomine has helped and he is feeling much better

## 2019-07-20 NOTE — Telephone Encounter (Signed)
Dr.Rehman was made aware. 

## 2019-07-23 DIAGNOSIS — J301 Allergic rhinitis due to pollen: Secondary | ICD-10-CM | POA: Diagnosis not present

## 2019-07-24 DIAGNOSIS — M797 Fibromyalgia: Secondary | ICD-10-CM | POA: Diagnosis not present

## 2019-07-24 DIAGNOSIS — K589 Irritable bowel syndrome without diarrhea: Secondary | ICD-10-CM | POA: Diagnosis not present

## 2019-07-24 DIAGNOSIS — E663 Overweight: Secondary | ICD-10-CM | POA: Diagnosis not present

## 2019-07-24 DIAGNOSIS — E291 Testicular hypofunction: Secondary | ICD-10-CM | POA: Diagnosis not present

## 2019-07-24 DIAGNOSIS — Z6828 Body mass index (BMI) 28.0-28.9, adult: Secondary | ICD-10-CM | POA: Diagnosis not present

## 2019-07-24 DIAGNOSIS — Z0000 Encounter for general adult medical examination without abnormal findings: Secondary | ICD-10-CM | POA: Diagnosis not present

## 2019-07-24 DIAGNOSIS — I10 Essential (primary) hypertension: Secondary | ICD-10-CM | POA: Diagnosis not present

## 2019-07-24 DIAGNOSIS — J309 Allergic rhinitis, unspecified: Secondary | ICD-10-CM | POA: Diagnosis not present

## 2019-07-24 DIAGNOSIS — K219 Gastro-esophageal reflux disease without esophagitis: Secondary | ICD-10-CM | POA: Diagnosis not present

## 2019-07-27 DIAGNOSIS — J3089 Other allergic rhinitis: Secondary | ICD-10-CM | POA: Diagnosis not present

## 2019-07-27 DIAGNOSIS — J301 Allergic rhinitis due to pollen: Secondary | ICD-10-CM | POA: Diagnosis not present

## 2019-07-27 DIAGNOSIS — J3081 Allergic rhinitis due to animal (cat) (dog) hair and dander: Secondary | ICD-10-CM | POA: Diagnosis not present

## 2019-07-28 DIAGNOSIS — Z6828 Body mass index (BMI) 28.0-28.9, adult: Secondary | ICD-10-CM | POA: Diagnosis not present

## 2019-07-28 DIAGNOSIS — Z0000 Encounter for general adult medical examination without abnormal findings: Secondary | ICD-10-CM | POA: Diagnosis not present

## 2019-07-28 DIAGNOSIS — E663 Overweight: Secondary | ICD-10-CM | POA: Diagnosis not present

## 2019-07-28 DIAGNOSIS — Z1389 Encounter for screening for other disorder: Secondary | ICD-10-CM | POA: Diagnosis not present

## 2019-08-03 DIAGNOSIS — J3089 Other allergic rhinitis: Secondary | ICD-10-CM | POA: Diagnosis not present

## 2019-08-03 DIAGNOSIS — J3081 Allergic rhinitis due to animal (cat) (dog) hair and dander: Secondary | ICD-10-CM | POA: Diagnosis not present

## 2019-08-10 DIAGNOSIS — J3081 Allergic rhinitis due to animal (cat) (dog) hair and dander: Secondary | ICD-10-CM | POA: Diagnosis not present

## 2019-08-10 DIAGNOSIS — J3089 Other allergic rhinitis: Secondary | ICD-10-CM | POA: Diagnosis not present

## 2019-08-10 DIAGNOSIS — J301 Allergic rhinitis due to pollen: Secondary | ICD-10-CM | POA: Diagnosis not present

## 2019-08-16 DIAGNOSIS — I10 Essential (primary) hypertension: Secondary | ICD-10-CM | POA: Diagnosis not present

## 2019-08-16 DIAGNOSIS — E7849 Other hyperlipidemia: Secondary | ICD-10-CM | POA: Diagnosis not present

## 2019-08-17 DIAGNOSIS — J3089 Other allergic rhinitis: Secondary | ICD-10-CM | POA: Diagnosis not present

## 2019-08-17 DIAGNOSIS — J3081 Allergic rhinitis due to animal (cat) (dog) hair and dander: Secondary | ICD-10-CM | POA: Diagnosis not present

## 2019-08-17 DIAGNOSIS — J301 Allergic rhinitis due to pollen: Secondary | ICD-10-CM | POA: Diagnosis not present

## 2019-08-24 ENCOUNTER — Other Ambulatory Visit: Payer: Self-pay | Admitting: *Deleted

## 2019-08-24 DIAGNOSIS — J3089 Other allergic rhinitis: Secondary | ICD-10-CM | POA: Diagnosis not present

## 2019-08-24 DIAGNOSIS — J301 Allergic rhinitis due to pollen: Secondary | ICD-10-CM | POA: Diagnosis not present

## 2019-08-24 DIAGNOSIS — G4733 Obstructive sleep apnea (adult) (pediatric): Secondary | ICD-10-CM | POA: Diagnosis not present

## 2019-08-24 DIAGNOSIS — Z79899 Other long term (current) drug therapy: Secondary | ICD-10-CM

## 2019-08-24 DIAGNOSIS — M359 Systemic involvement of connective tissue, unspecified: Secondary | ICD-10-CM

## 2019-08-24 DIAGNOSIS — J3081 Allergic rhinitis due to animal (cat) (dog) hair and dander: Secondary | ICD-10-CM | POA: Diagnosis not present

## 2019-08-25 NOTE — Progress Notes (Signed)
Anticardiolipin IgM antibody is within the indeterminate range but has trended down. No changes recommended at this time.

## 2019-08-25 NOTE — Progress Notes (Signed)
CBC and CMP stable.

## 2019-08-26 DIAGNOSIS — Z0100 Encounter for examination of eyes and vision without abnormal findings: Secondary | ICD-10-CM | POA: Diagnosis not present

## 2019-08-26 DIAGNOSIS — H5200 Hypermetropia, unspecified eye: Secondary | ICD-10-CM | POA: Diagnosis not present

## 2019-08-27 LAB — COMPLETE METABOLIC PANEL WITH GFR
AG Ratio: 1.4 (calc) (ref 1.0–2.5)
ALT: 16 U/L (ref 9–46)
AST: 16 U/L (ref 10–35)
Albumin: 4 g/dL (ref 3.6–5.1)
Alkaline phosphatase (APISO): 89 U/L (ref 35–144)
BUN: 18 mg/dL (ref 7–25)
CO2: 24 mmol/L (ref 20–32)
Calcium: 9.1 mg/dL (ref 8.6–10.3)
Chloride: 101 mmol/L (ref 98–110)
Creat: 0.83 mg/dL (ref 0.70–1.18)
GFR, Est African American: 100 mL/min/{1.73_m2} (ref >=60)
GFR, Est Non African American: 86 mL/min/{1.73_m2} (ref >=60)
Globulin: 2.9 g/dL (calc) (ref 1.9–3.7)
Glucose, Bld: 100 mg/dL — ABNORMAL HIGH (ref 65–99)
Potassium: 3.9 mmol/L (ref 3.5–5.3)
Sodium: 133 mmol/L — ABNORMAL LOW (ref 135–146)
Total Bilirubin: 0.6 mg/dL (ref 0.2–1.2)
Total Protein: 6.9 g/dL (ref 6.1–8.1)

## 2019-08-27 LAB — LUPUS ANTICOAGULANT EVAL W/ REFLEX
PTT-LA Screen: 50 s — ABNORMAL HIGH (ref <=40)
dRVVT: 44 s (ref <=45)

## 2019-08-27 LAB — CBC WITH DIFFERENTIAL/PLATELET
Absolute Monocytes: 462 cells/uL (ref 200–950)
Basophils Absolute: 50 cells/uL (ref 0–200)
Basophils Relative: 0.7 %
Eosinophils Absolute: 199 cells/uL (ref 15–500)
Eosinophils Relative: 2.8 %
HCT: 38.3 % — ABNORMAL LOW (ref 38.5–50.0)
Hemoglobin: 13.3 g/dL (ref 13.2–17.1)
Lymphs Abs: 1200 cells/uL (ref 850–3900)
MCH: 31.2 pg (ref 27.0–33.0)
MCHC: 34.7 g/dL (ref 32.0–36.0)
MCV: 89.9 fL (ref 80.0–100.0)
MPV: 9.4 fL (ref 7.5–12.5)
Monocytes Relative: 6.5 %
Neutro Abs: 5190 cells/uL (ref 1500–7800)
Neutrophils Relative %: 73.1 %
Platelets: 244 10*3/uL (ref 140–400)
RBC: 4.26 10*6/uL (ref 4.20–5.80)
RDW: 14.2 % (ref 11.0–15.0)
Total Lymphocyte: 16.9 %
WBC: 7.1 10*3/uL (ref 3.8–10.8)

## 2019-08-27 LAB — CARDIOLIPIN ANTIBODIES, IGG, IGM, IGA
Anticardiolipin IgA: <11 [APL'U]
Anticardiolipin IgG: <14 [GPL'U]
Anticardiolipin IgM: 18 [MPL'U] — ABNORMAL HIGH

## 2019-08-27 LAB — RFLX HEXAGONAL PHASE CONFIRM: Hexagonal Phase Conf: NEGATIVE

## 2019-08-27 LAB — BETA-2 GLYCOPROTEIN ANTIBODIES
Beta-2 Glyco 1 IgA: <9 SAU (ref <=20)
Beta-2 Glyco 1 IgM: >150 SMU — ABNORMAL HIGH (ref <=20)
Beta-2 Glyco I IgG: 12 SGU (ref <=20)

## 2019-08-27 NOTE — Progress Notes (Signed)
Lupus anticoagulant not detected.  Beta-2 IgM elevated but stable.

## 2019-09-01 DIAGNOSIS — J301 Allergic rhinitis due to pollen: Secondary | ICD-10-CM | POA: Diagnosis not present

## 2019-09-01 DIAGNOSIS — J3089 Other allergic rhinitis: Secondary | ICD-10-CM | POA: Diagnosis not present

## 2019-09-01 DIAGNOSIS — J3081 Allergic rhinitis due to animal (cat) (dog) hair and dander: Secondary | ICD-10-CM | POA: Diagnosis not present

## 2019-09-08 DIAGNOSIS — J301 Allergic rhinitis due to pollen: Secondary | ICD-10-CM | POA: Diagnosis not present

## 2019-09-08 DIAGNOSIS — J3089 Other allergic rhinitis: Secondary | ICD-10-CM | POA: Diagnosis not present

## 2019-09-08 DIAGNOSIS — J3081 Allergic rhinitis due to animal (cat) (dog) hair and dander: Secondary | ICD-10-CM | POA: Diagnosis not present

## 2019-09-16 DIAGNOSIS — J3089 Other allergic rhinitis: Secondary | ICD-10-CM | POA: Diagnosis not present

## 2019-09-16 DIAGNOSIS — J3081 Allergic rhinitis due to animal (cat) (dog) hair and dander: Secondary | ICD-10-CM | POA: Diagnosis not present

## 2019-09-16 DIAGNOSIS — J301 Allergic rhinitis due to pollen: Secondary | ICD-10-CM | POA: Diagnosis not present

## 2019-09-21 ENCOUNTER — Other Ambulatory Visit: Payer: Self-pay | Admitting: Rheumatology

## 2019-09-21 DIAGNOSIS — M359 Systemic involvement of connective tissue, unspecified: Secondary | ICD-10-CM

## 2019-09-21 NOTE — Telephone Encounter (Signed)
Last Visit: 07/09/19 Next Visit: 10/20/19 Labs: 08/24/19 CBC and CMP stable  Current Dose per office note on 07/09/19:  MTX 8 tablets po once weekly  Okay to refill per Dr. Estanislado Pandy

## 2019-10-07 DIAGNOSIS — J3089 Other allergic rhinitis: Secondary | ICD-10-CM | POA: Diagnosis not present

## 2019-10-07 DIAGNOSIS — J301 Allergic rhinitis due to pollen: Secondary | ICD-10-CM | POA: Diagnosis not present

## 2019-10-07 DIAGNOSIS — J3081 Allergic rhinitis due to animal (cat) (dog) hair and dander: Secondary | ICD-10-CM | POA: Diagnosis not present

## 2019-10-14 DIAGNOSIS — J3089 Other allergic rhinitis: Secondary | ICD-10-CM | POA: Diagnosis not present

## 2019-10-14 DIAGNOSIS — J301 Allergic rhinitis due to pollen: Secondary | ICD-10-CM | POA: Diagnosis not present

## 2019-10-14 DIAGNOSIS — J3081 Allergic rhinitis due to animal (cat) (dog) hair and dander: Secondary | ICD-10-CM | POA: Diagnosis not present

## 2019-10-15 NOTE — Progress Notes (Signed)
Office Visit Note  Patient: Cole Brown             Date of Birth: 04/23/1946           MRN: [id number]             PCP: Redmond School, MD Referring: Redmond School, MD Visit Date: 10/20/2019 Occupation: _0 @  Subjective:  [name]   History of Present Illness: Cole Brown is a 74 y.o. male with history of autoimmune disease and fibromyalgia.  Patient is taking methotrexate 8 tablets by mouth once daily and folic acid 2 mg a mouth daily.  He denies missing any doses of methotrexate or folic acid recently.  He is tolerating methotrexate without any side effects.  He continues take aspirin 81 mg by mouth daily.  He states that he is having frequent symptoms of [name] this past winter but his symptoms have been less frequent with warmer weather.  He denies any digital ulcerations or signs of gangrene.  He denies any recent rashes.  He denies any oral or nasal ulcerations.  He continues to have chronic sicca symptoms.  He denies any increased joint pain or joint swelling. He states he entered a clinical study about 1 month ago and is either receiving atorvastatin 40 mg daily or placebo.  He states the study is to last 5 years.    Activities of Daily Living:  Patient reports morning stiffness for 0 none.   Patient Reports nocturnal pain.  Difficulty dressing/grooming: Denies Difficulty climbing stairs: Denies Difficulty getting out of chair: Denies Difficulty using hands for taps, buttons, cutlery, and/or writing: Denies  Review of Systems  Constitutional: Positive for fatigue. Negative for night sweats.  HENT: Positive for mouth dryness. Negative for mouth sores and nose dryness.   Eyes: Positive for dryness. Negative for redness.  Respiratory: Negative for cough, hemoptysis, shortness of breath and difficulty breathing.   Cardiovascular: Positive for swelling in legs/feet. Negative for chest pain, palpitations, hypertension and irregular heartbeat.  Gastrointestinal:  Negative for constipation and diarrhea.  Endocrine: Negative for excessive thirst and increased urination.  Genitourinary: Negative for difficulty urinating and painful urination.  Musculoskeletal: Positive for arthralgias, joint pain and muscle tenderness. Negative for joint swelling, myalgias, muscle weakness, morning stiffness and myalgias.  Skin: Positive for rash. Negative for color change, hair loss, nodules/bumps, skin tightness, ulcers and sensitivity to sunlight.  Allergic/Immunologic: Negative for susceptible to infections.  Neurological: Positive for numbness. Negative for dizziness, fainting, memory loss, night sweats and weakness.  Hematological: Positive for bruising/bleeding tendency. Negative for swollen glands.  Psychiatric/Behavioral: Positive for sleep disturbance. Negative for depressed mood. The patient is not nervous/anxious.     PMFS History:  Patient Active Problem List   Diagnosis Date Noted  . Lower abdominal pain 07/06/2019  . Autoimmune disease ([place]) 09/15/2018  . 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 Hypercholesteremia 08/21/2018  . History of diverticulitis 08/21/2018  . History of iron deficiency anemia 08/21/2018  . 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 Fibromyalgia 08/21/2018  . History of multiple allergies 08/21/2018  . History of sleep apnea 08/21/2018  . Rectal bleed 11/14/2016  . Essential hypertension 11/14/2016    Past Medical History:  Diagnosis Date  . Diverticula of colon    2016 colonoscopy  . Fibromyalgia   . Fibromyalgia   . History of GI diverticular bleed   . Hypercholesteremia   . Hypertension     Family History  Problem Relation Age of Onset  . Heart  disease Mother   . Pancreatic cancer Mother   . Prostate cancer Father   . Healthy Sister   . Parkinson's disease Brother   . Asthma Sister   . Healthy Sister    Past Surgical History:  Procedure Laterality Date  . allergy shots  weekly  . CHOLECYSTECTOMY      . COLONOSCOPY     Dr.Rehman  . COLONOSCOPY N/A 01/27/2015   Procedure: COLONOSCOPY;  Surgeon: Rogene Houston, MD;  Location: [facility];  Service: Endoscopy;  Laterality: N/A;  930  . EYE SURGERY Bilateral    partial cornea transplants   . GALLBLADDER SURGERY  12/1993  . UPPER GASTROINTESTINAL ENDOSCOPY     Social History   Social History Narrative  . Not on file    There is no immunization history on file for this patient.   Objective: Vital Signs: BP (!) 109/59 (BP Location: Left Arm, Patient Position: Sitting, Cuff Size: Normal)   Pulse (!) 53   Resp 18   Ht _0  (1.803 m)   Wt 200 lb 12.8 oz (91.1 kg)   BMI 28.01 kg/m    Physical Exam Vitals and nursing note reviewed.  Constitutional:      Appearance: He is well-developed.  HENT:     Head: Normocephalic and atraumatic.  Eyes:     Conjunctiva/sclera: Conjunctivae normal.     Pupils: Pupils are equal, round, and reactive to light.  Pulmonary:     Effort: Pulmonary effort is normal.  Abdominal:     General: Bowel sounds are normal.     Palpations: Abdomen is soft.  Musculoskeletal:     Cervical back: Normal range of motion and neck supple.  Skin:    General: Skin is warm and dry.     Capillary Refill: Capillary refill takes less than 2 seconds.  Neurological:     Mental Status: He is alert and oriented to person, place, and time.  Psychiatric:        Behavior: Behavior normal.      Musculoskeletal Exam: C-spine, thoracic spine, and lumbar spine good ROM.  No midline spinal tenderness.  No SI joint tenderness.  Shoulder joints, elbow joints, wrist joints, MCPs, PIPs, and DIPs good ROM with no synovitis.  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  Hip joints good ROM with no discomfort. Good ROM of both knee joints. Left knee swelling but no warmth noted.  No warmth or effusion of right knee joint.  Ankle joints good ROM with no tenderness or inflammation.   CDAI Exam: CDAI Score: -- Patient  Global: --; Provider Global: -- Swollen: 1 ; Tender: 0  Joint Exam 10/20/2019      Right  Left  Knee     Swollen      Investigation: No additional findings.  Imaging: No results found.  Recent Labs: Lab Results  Component Value Date   WBC 7.1 08/24/2019   HGB 13.3 08/24/2019   PLT 244 08/24/2019   NA 133 (L) 08/24/2019   K 3.9 08/24/2019   CL 101 08/24/2019   CO2 24 08/24/2019   GLUCOSE 100 (H) 08/24/2019   BUN 18 08/24/2019   CREATININE 0.83 08/24/2019   BILITOT 0.6 08/24/2019   ALKPHOS 69 11/15/2016   AST 16 08/24/2019   ALT 16 08/24/2019   PROT 6.9 08/24/2019   ALBUMIN 3.5 11/15/2016   CALCIUM 9.1 08/24/2019   GFRAA 100 08/24/2019   QFTBGOLDPLUS NEGATIVE 08/21/2018    Speciality Comments: No specialty  comments available.  Procedures:  No procedures performed Allergies: Plaquenil [hydroxychloroquine sulfate] and Lodine [etodolac]   Assessment / Plan:     Visit Diagnoses: Autoimmune disease ([place]) - ANA negative ENA negative, [name] phenomenon, beta-2 GP 1 IgM positive, anticardiolipin IgM positive, elevated ESR, severe inflammatory arthritis: He has not had any signs or symptoms of a flare recently.  He is clinically doing well on MTX 8 tablets po once weekly and folic acid 2 mg po daily.  He is tolerating MTX without any side effects and has noticed significant clinical improvement while taking MTX.  He has no synovitis on exam today.  He continues to have intermittent symptoms of raynaud's.  No digital ulcerations or signs of gangrene noted.  He has good capillary refill on exam.  He takes aspirin 81 mg 1 tablet daily.  He has not had any recent rashes, photosensitivity, oral or nasal ulcerations, chest pain, or shortness of breath.  He has chronic sicca symptoms which have been tolerable. He will continue taking MTX 8 tablets po once weekly, folic acid 2 mg po daily, and aspirin 81 mg po daily.  He was advised to notify us if he develops any new or worsening  symptoms.  He will follow up in 5 months.   High risk medication use -  MTX 8 tablets po once weekly, folic acid 2 mg po daily.  D/c PLQ-rash. CBC and CMP were drawn on 08/24/19.  He will return for lab work in June and every 3 months. Standing orders for CBC and CMP are in place.   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  He has no digital ulcerations or signs of gangrene on exam.  Good capillary refill on exam. He will continue taking aspirin 81 mg po daily and amlodipine 10 mg po daily.  He was advised to notify us if he develops any new or worsening symptoms.   Fibromyalgia: He has intermittent myalgias and muscle tenderness due to underlying fibromyalgia.  He has chronic fatigue secondary to insomnia.    Other medical conditions are listed as follows:   Essential hypertension  History of diverticulitis  History of multiple allergies  History of iron deficiency anemia  History of sleep apnea  Fuchs' corneal dystrophy  Hypercholesteremia  Orders: No orders of the defined types were placed in this encounter.  No orders of the defined types were placed in this encounter.     Follow-Up Instructions: Return in about 5 months (around 03/21/2020) for Autoimmune Disease.   Ofilia Neas, PA-C  Note - This record has been created using Dragon software.  Chart creation errors have been sought, but may not always  have been located. Such creation errors do not reflect on  the standard of medical care.

## 2019-10-20 ENCOUNTER — Encounter: Payer: Self-pay | Admitting: Physician Assistant

## 2019-10-20 ENCOUNTER — Ambulatory Visit: Payer: Medicare HMO | Admitting: Physician Assistant

## 2019-10-20 ENCOUNTER — Other Ambulatory Visit: Payer: Self-pay

## 2019-10-20 VITALS — BP 109/59 | HR 53 | Resp 18 | Ht 71.0 in | Wt 200.8 lb

## 2019-10-20 DIAGNOSIS — E7800 Pure hypercholesterolemia, unspecified: Secondary | ICD-10-CM

## 2019-10-20 DIAGNOSIS — M797 Fibromyalgia: Secondary | ICD-10-CM

## 2019-10-20 DIAGNOSIS — M359 Systemic involvement of connective tissue, unspecified: Secondary | ICD-10-CM

## 2019-10-20 DIAGNOSIS — Z9189 Other specified personal risk factors, not elsewhere classified: Secondary | ICD-10-CM

## 2019-10-20 DIAGNOSIS — I10 Essential (primary) hypertension: Secondary | ICD-10-CM

## 2019-10-20 DIAGNOSIS — I7300 Raynaud's syndrome without gangrene: Secondary | ICD-10-CM | POA: Diagnosis not present

## 2019-10-20 DIAGNOSIS — J301 Allergic rhinitis due to pollen: Secondary | ICD-10-CM | POA: Diagnosis not present

## 2019-10-20 DIAGNOSIS — Z889 Allergy status to unspecified drugs, medicaments and biological substances status: Secondary | ICD-10-CM

## 2019-10-20 DIAGNOSIS — Z79899 Other long term (current) drug therapy: Secondary | ICD-10-CM | POA: Diagnosis not present

## 2019-10-20 DIAGNOSIS — Z8719 Personal history of other diseases of the digestive system: Secondary | ICD-10-CM

## 2019-10-20 DIAGNOSIS — Z8669 Personal history of other diseases of the nervous system and sense organs: Secondary | ICD-10-CM | POA: Diagnosis not present

## 2019-10-20 DIAGNOSIS — J3089 Other allergic rhinitis: Secondary | ICD-10-CM | POA: Diagnosis not present

## 2019-10-20 DIAGNOSIS — J3081 Allergic rhinitis due to animal (cat) (dog) hair and dander: Secondary | ICD-10-CM | POA: Diagnosis not present

## 2019-10-20 DIAGNOSIS — Z862 Personal history of diseases of the blood and blood-forming organs and certain disorders involving the immune mechanism: Secondary | ICD-10-CM | POA: Diagnosis not present

## 2019-10-20 DIAGNOSIS — H18519 Endothelial corneal dystrophy, unspecified eye: Secondary | ICD-10-CM

## 2019-10-20 NOTE — Patient Instructions (Signed)
Standing Labs We placed an order today for your standing lab work.    Please come back and get your standing labs in June and every 3 months   We have open lab daily Monday through Thursday from 8:30-12:30 PM and 1:30-4:30 PM and Friday from 8:30-12:30 PM and 1:30-4:00 PM at the office of Dr. Shaili Deveshwar.   You may experience shorter wait times on Monday and Friday afternoons. The office is located at [address], [address], [place], [place] [address] No appointment is necessary.   Labs are drawn by [facility].  You may receive a bill from [facility] for your lab work.  If you wish to have your labs drawn at another location, please call the office 24 hours in advance to send orders.  If you have any questions regarding directions or hours of operation,  please call [phone].   Just as a reminder please drink plenty of water prior to coming for your lab work. Thanks!   

## 2019-10-28 DIAGNOSIS — J301 Allergic rhinitis due to pollen: Secondary | ICD-10-CM | POA: Diagnosis not present

## 2019-10-28 DIAGNOSIS — J3081 Allergic rhinitis due to animal (cat) (dog) hair and dander: Secondary | ICD-10-CM | POA: Diagnosis not present

## 2019-10-28 DIAGNOSIS — J3089 Other allergic rhinitis: Secondary | ICD-10-CM | POA: Diagnosis not present

## 2019-11-04 DIAGNOSIS — J3081 Allergic rhinitis due to animal (cat) (dog) hair and dander: Secondary | ICD-10-CM | POA: Diagnosis not present

## 2019-11-04 DIAGNOSIS — J3089 Other allergic rhinitis: Secondary | ICD-10-CM | POA: Diagnosis not present

## 2019-11-04 DIAGNOSIS — J301 Allergic rhinitis due to pollen: Secondary | ICD-10-CM | POA: Diagnosis not present

## 2019-11-18 DIAGNOSIS — J3081 Allergic rhinitis due to animal (cat) (dog) hair and dander: Secondary | ICD-10-CM | POA: Diagnosis not present

## 2019-11-18 DIAGNOSIS — J301 Allergic rhinitis due to pollen: Secondary | ICD-10-CM | POA: Diagnosis not present

## 2019-11-18 DIAGNOSIS — J3089 Other allergic rhinitis: Secondary | ICD-10-CM | POA: Diagnosis not present

## 2019-11-25 DIAGNOSIS — G4733 Obstructive sleep apnea (adult) (pediatric): Secondary | ICD-10-CM | POA: Diagnosis not present

## 2019-12-04 DIAGNOSIS — J3089 Other allergic rhinitis: Secondary | ICD-10-CM | POA: Diagnosis not present

## 2019-12-04 DIAGNOSIS — J3081 Allergic rhinitis due to animal (cat) (dog) hair and dander: Secondary | ICD-10-CM | POA: Diagnosis not present

## 2019-12-04 DIAGNOSIS — J301 Allergic rhinitis due to pollen: Secondary | ICD-10-CM | POA: Diagnosis not present

## 2019-12-14 DIAGNOSIS — J3081 Allergic rhinitis due to animal (cat) (dog) hair and dander: Secondary | ICD-10-CM | POA: Diagnosis not present

## 2019-12-14 DIAGNOSIS — J3089 Other allergic rhinitis: Secondary | ICD-10-CM | POA: Diagnosis not present

## 2019-12-14 DIAGNOSIS — J301 Allergic rhinitis due to pollen: Secondary | ICD-10-CM | POA: Diagnosis not present

## 2019-12-18 ENCOUNTER — Other Ambulatory Visit: Payer: Self-pay

## 2019-12-18 ENCOUNTER — Other Ambulatory Visit: Payer: Self-pay | Admitting: Rheumatology

## 2019-12-18 DIAGNOSIS — Z79899 Other long term (current) drug therapy: Secondary | ICD-10-CM

## 2019-12-18 DIAGNOSIS — M359 Systemic involvement of connective tissue, unspecified: Secondary | ICD-10-CM

## 2019-12-18 LAB — CBC WITH DIFFERENTIAL/PLATELET
Absolute Monocytes: 583 cells/uL (ref 200–950)
Basophils Absolute: 47 cells/uL (ref 0–200)
Basophils Relative: 0.5 %
Eosinophils Absolute: 254 cells/uL (ref 15–500)
Eosinophils Relative: 2.7 %
HCT: 34.9 % — ABNORMAL LOW (ref 38.5–50.0)
Hemoglobin: 12.1 g/dL — ABNORMAL LOW (ref 13.2–17.1)
Lymphs Abs: 1636 cells/uL (ref 850–3900)
MCH: 31.2 pg (ref 27.0–33.0)
MCHC: 34.7 g/dL (ref 32.0–36.0)
MCV: 89.9 fL (ref 80.0–100.0)
MPV: 9.7 fL (ref 7.5–12.5)
Monocytes Relative: 6.2 %
Neutro Abs: 6881 cells/uL (ref 1500–7800)
Neutrophils Relative %: 73.2 %
Platelets: 242 10*3/uL (ref 140–400)
RBC: 3.88 10*6/uL — ABNORMAL LOW (ref 4.20–5.80)
RDW: 14.3 % (ref 11.0–15.0)
Total Lymphocyte: 17.4 %
WBC: 9.4 10*3/uL (ref 3.8–10.8)

## 2019-12-18 LAB — COMPLETE METABOLIC PANEL WITH GFR
AG Ratio: 1.5 (calc) (ref 1.0–2.5)
ALT: 15 U/L (ref 9–46)
AST: 17 U/L (ref 10–35)
Albumin: 3.9 g/dL (ref 3.6–5.1)
Alkaline phosphatase (APISO): 113 U/L (ref 35–144)
BUN: 13 mg/dL (ref 7–25)
CO2: 25 mmol/L (ref 20–32)
Calcium: 9.1 mg/dL (ref 8.6–10.3)
Chloride: 98 mmol/L (ref 98–110)
Creat: 0.95 mg/dL (ref 0.70–1.18)
GFR, Est African American: 90 mL/min/{1.73_m2} (ref >=60)
GFR, Est Non African American: 77 mL/min/{1.73_m2} (ref >=60)
Globulin: 2.6 g/dL (calc) (ref 1.9–3.7)
Glucose, Bld: 92 mg/dL (ref 65–99)
Potassium: 4.3 mmol/L (ref 3.5–5.3)
Sodium: 129 mmol/L — ABNORMAL LOW (ref 135–146)
Total Bilirubin: 0.5 mg/dL (ref 0.2–1.2)
Total Protein: 6.5 g/dL (ref 6.1–8.1)

## 2019-12-18 NOTE — Telephone Encounter (Addendum)
Last Visit: 10/20/2019 Next visit: 03/22/2020 Labs: 08/24/2019 CBC and CMP stable  Current Dose per office note on 10/20/2019: MTX 8 tablets po once weekly, folic acid 2 mg po daily  Patient advised he is due to update labs. Patient will update on 12/22/2019. Patient may try to come today.   Okay to refill Folic Acid and MTX?

## 2019-12-22 DIAGNOSIS — J3081 Allergic rhinitis due to animal (cat) (dog) hair and dander: Secondary | ICD-10-CM | POA: Diagnosis not present

## 2019-12-22 DIAGNOSIS — J3089 Other allergic rhinitis: Secondary | ICD-10-CM | POA: Diagnosis not present

## 2019-12-22 DIAGNOSIS — J301 Allergic rhinitis due to pollen: Secondary | ICD-10-CM | POA: Diagnosis not present

## 2019-12-22 NOTE — Progress Notes (Signed)
Sodium is low and trending down.  Please notify the patient and forward labs to PCP.   RBC count, hgb, and hct are low but stable.

## 2020-01-05 DIAGNOSIS — R05 Cough: Secondary | ICD-10-CM | POA: Diagnosis not present

## 2020-01-05 DIAGNOSIS — J301 Allergic rhinitis due to pollen: Secondary | ICD-10-CM | POA: Diagnosis not present

## 2020-01-05 DIAGNOSIS — J3081 Allergic rhinitis due to animal (cat) (dog) hair and dander: Secondary | ICD-10-CM | POA: Diagnosis not present

## 2020-01-05 DIAGNOSIS — J3089 Other allergic rhinitis: Secondary | ICD-10-CM | POA: Diagnosis not present

## 2020-01-12 DIAGNOSIS — J301 Allergic rhinitis due to pollen: Secondary | ICD-10-CM | POA: Diagnosis not present

## 2020-01-12 DIAGNOSIS — J3081 Allergic rhinitis due to animal (cat) (dog) hair and dander: Secondary | ICD-10-CM | POA: Diagnosis not present

## 2020-01-12 DIAGNOSIS — J3089 Other allergic rhinitis: Secondary | ICD-10-CM | POA: Diagnosis not present

## 2020-01-14 DIAGNOSIS — J3081 Allergic rhinitis due to animal (cat) (dog) hair and dander: Secondary | ICD-10-CM | POA: Diagnosis not present

## 2020-01-14 DIAGNOSIS — J3089 Other allergic rhinitis: Secondary | ICD-10-CM | POA: Diagnosis not present

## 2020-01-15 DIAGNOSIS — E7849 Other hyperlipidemia: Secondary | ICD-10-CM | POA: Diagnosis not present

## 2020-01-15 DIAGNOSIS — I10 Essential (primary) hypertension: Secondary | ICD-10-CM | POA: Diagnosis not present

## 2020-01-18 DIAGNOSIS — J301 Allergic rhinitis due to pollen: Secondary | ICD-10-CM | POA: Diagnosis not present

## 2020-01-20 DIAGNOSIS — J301 Allergic rhinitis due to pollen: Secondary | ICD-10-CM | POA: Diagnosis not present

## 2020-01-20 DIAGNOSIS — J3081 Allergic rhinitis due to animal (cat) (dog) hair and dander: Secondary | ICD-10-CM | POA: Diagnosis not present

## 2020-01-20 DIAGNOSIS — J3089 Other allergic rhinitis: Secondary | ICD-10-CM | POA: Diagnosis not present

## 2020-01-25 DIAGNOSIS — G4709 Other insomnia: Secondary | ICD-10-CM | POA: Diagnosis not present

## 2020-01-25 DIAGNOSIS — I10 Essential (primary) hypertension: Secondary | ICD-10-CM | POA: Diagnosis not present

## 2020-01-25 DIAGNOSIS — E663 Overweight: Secondary | ICD-10-CM | POA: Diagnosis not present

## 2020-01-25 DIAGNOSIS — Z6827 Body mass index (BMI) 27.0-27.9, adult: Secondary | ICD-10-CM | POA: Diagnosis not present

## 2020-01-29 DIAGNOSIS — J3081 Allergic rhinitis due to animal (cat) (dog) hair and dander: Secondary | ICD-10-CM | POA: Diagnosis not present

## 2020-01-29 DIAGNOSIS — J3089 Other allergic rhinitis: Secondary | ICD-10-CM | POA: Diagnosis not present

## 2020-01-29 DIAGNOSIS — J301 Allergic rhinitis due to pollen: Secondary | ICD-10-CM | POA: Diagnosis not present

## 2020-02-01 ENCOUNTER — Other Ambulatory Visit: Payer: Self-pay | Admitting: Physician Assistant

## 2020-02-01 DIAGNOSIS — M359 Systemic involvement of connective tissue, unspecified: Secondary | ICD-10-CM

## 2020-02-01 DIAGNOSIS — J301 Allergic rhinitis due to pollen: Secondary | ICD-10-CM | POA: Diagnosis not present

## 2020-02-01 DIAGNOSIS — J3089 Other allergic rhinitis: Secondary | ICD-10-CM | POA: Diagnosis not present

## 2020-02-01 DIAGNOSIS — J3081 Allergic rhinitis due to animal (cat) (dog) hair and dander: Secondary | ICD-10-CM | POA: Diagnosis not present

## 2020-02-01 NOTE — Telephone Encounter (Signed)
Last Visit: 10/20/2019 Next visit: 03/22/2020 Labs: 12/18/2019 Sodium is low and trending down. RBC count, hgb, and hct are low but stable.   Current Dose per office note on 10/20/2019: MTX 8 tablets po once weekly  Okay to refill MTX?

## 2020-02-03 DIAGNOSIS — J3081 Allergic rhinitis due to animal (cat) (dog) hair and dander: Secondary | ICD-10-CM | POA: Diagnosis not present

## 2020-02-03 DIAGNOSIS — J3089 Other allergic rhinitis: Secondary | ICD-10-CM | POA: Diagnosis not present

## 2020-02-03 DIAGNOSIS — J301 Allergic rhinitis due to pollen: Secondary | ICD-10-CM | POA: Diagnosis not present

## 2020-02-08 DIAGNOSIS — J3081 Allergic rhinitis due to animal (cat) (dog) hair and dander: Secondary | ICD-10-CM | POA: Diagnosis not present

## 2020-02-08 DIAGNOSIS — J3089 Other allergic rhinitis: Secondary | ICD-10-CM | POA: Diagnosis not present

## 2020-02-08 DIAGNOSIS — J301 Allergic rhinitis due to pollen: Secondary | ICD-10-CM | POA: Diagnosis not present

## 2020-02-12 DIAGNOSIS — J301 Allergic rhinitis due to pollen: Secondary | ICD-10-CM | POA: Diagnosis not present

## 2020-02-12 DIAGNOSIS — J3081 Allergic rhinitis due to animal (cat) (dog) hair and dander: Secondary | ICD-10-CM | POA: Diagnosis not present

## 2020-02-12 DIAGNOSIS — J3089 Other allergic rhinitis: Secondary | ICD-10-CM | POA: Diagnosis not present

## 2020-02-16 DIAGNOSIS — I10 Essential (primary) hypertension: Secondary | ICD-10-CM | POA: Diagnosis not present

## 2020-02-16 DIAGNOSIS — F419 Anxiety disorder, unspecified: Secondary | ICD-10-CM | POA: Diagnosis not present

## 2020-02-16 DIAGNOSIS — E7849 Other hyperlipidemia: Secondary | ICD-10-CM | POA: Diagnosis not present

## 2020-02-18 DIAGNOSIS — J3081 Allergic rhinitis due to animal (cat) (dog) hair and dander: Secondary | ICD-10-CM | POA: Diagnosis not present

## 2020-02-18 DIAGNOSIS — J301 Allergic rhinitis due to pollen: Secondary | ICD-10-CM | POA: Diagnosis not present

## 2020-02-18 DIAGNOSIS — J3089 Other allergic rhinitis: Secondary | ICD-10-CM | POA: Diagnosis not present

## 2020-02-23 DIAGNOSIS — J3089 Other allergic rhinitis: Secondary | ICD-10-CM | POA: Diagnosis not present

## 2020-02-23 DIAGNOSIS — G4733 Obstructive sleep apnea (adult) (pediatric): Secondary | ICD-10-CM | POA: Diagnosis not present

## 2020-02-23 DIAGNOSIS — J3081 Allergic rhinitis due to animal (cat) (dog) hair and dander: Secondary | ICD-10-CM | POA: Diagnosis not present

## 2020-02-23 DIAGNOSIS — J301 Allergic rhinitis due to pollen: Secondary | ICD-10-CM | POA: Diagnosis not present

## 2020-03-03 DIAGNOSIS — J301 Allergic rhinitis due to pollen: Secondary | ICD-10-CM | POA: Diagnosis not present

## 2020-03-03 DIAGNOSIS — J3081 Allergic rhinitis due to animal (cat) (dog) hair and dander: Secondary | ICD-10-CM | POA: Diagnosis not present

## 2020-03-03 DIAGNOSIS — J3089 Other allergic rhinitis: Secondary | ICD-10-CM | POA: Diagnosis not present

## 2020-03-07 DIAGNOSIS — J3089 Other allergic rhinitis: Secondary | ICD-10-CM | POA: Diagnosis not present

## 2020-03-07 DIAGNOSIS — J301 Allergic rhinitis due to pollen: Secondary | ICD-10-CM | POA: Diagnosis not present

## 2020-03-07 DIAGNOSIS — J3081 Allergic rhinitis due to animal (cat) (dog) hair and dander: Secondary | ICD-10-CM | POA: Diagnosis not present

## 2020-03-08 NOTE — Progress Notes (Signed)
Office Visit Note  Patient: Cole Brown             Date of Birth: 1946/02/08           MRN: [id number]             PCP: Redmond School, MD Referring: Redmond School, MD Visit Date: 03/22/2020 Occupation: _0 @  Subjective:  Medication monitoring   History of Present Illness: Cole Brown is a 74 y.o. male with history of autoimmune disease and fibromyalgia.  He is taking Methotrexate 8 tablets by mouth once weekly and folic acid 2 mg po daily.  He is tolerating MTX without any side effects. He denies any signs or symptoms of a flare.  He experiences intermittent symptoms of [name] but denies any digital ulcerations or signs of gangrene.  He denies any recent rashes but does have persistent photosensitivity.  He tries to avoid direct sun exposure.  He has chronic sicca symptoms and has occasional oral ulcers.  He denies any swollen lymph nodes.  He denies any joint pain, joint swelling, joint stiffness at this time He has not had any recent infections.  He has received both COVID-19 vaccinations and plans on receiving the third dose.   Activities of Daily Living:  Patient reports morning stiffness for 0  minutes.   Patient Denies nocturnal pain.  Difficulty dressing/grooming: Denies Difficulty climbing stairs: Denies Difficulty getting out of chair: Denies Difficulty using hands for taps, buttons, cutlery, and/or writing: Denies  Review of Systems  Constitutional: Negative for fatigue and night sweats.  HENT: Positive for mouth dryness. Negative for mouth sores and nose dryness.   Eyes: Positive for redness and itching. Negative for dryness.  Respiratory: Negative for shortness of breath and difficulty breathing.   Cardiovascular: Negative for chest pain, palpitations, hypertension, irregular heartbeat and swelling in legs/feet.  Gastrointestinal: Negative for blood in stool, constipation and diarrhea.  Endocrine: Negative for increased urination.  Genitourinary:  Negative for painful urination.  Musculoskeletal: Positive for muscle tenderness. Negative for arthralgias, joint pain, joint swelling, myalgias, muscle weakness, morning stiffness and myalgias.  Skin: Negative for color change, rash, hair loss, nodules/bumps, skin tightness, ulcers and sensitivity to sunlight.  Allergic/Immunologic: Negative for susceptible to infections.  Neurological: Negative for dizziness, fainting, memory loss, night sweats and weakness.  Hematological: Negative for swollen glands.  Psychiatric/Behavioral: Negative for depressed mood, confusion and sleep disturbance. The patient is not nervous/anxious.     PMFS History:  Patient Active Problem List   Diagnosis Date Noted  . Lower abdominal pain 07/06/2019  . Autoimmune disease ([place]) 09/15/2018  . 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 Hypercholesteremia 08/21/2018  . History of diverticulitis 08/21/2018  . History of iron deficiency anemia 08/21/2018  . 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 Fibromyalgia 08/21/2018  . History of multiple allergies 08/21/2018  . History of sleep apnea 08/21/2018  . Rectal bleed 11/14/2016  . Essential hypertension 11/14/2016    Past Medical History:  Diagnosis Date  . Diverticula of colon    2016 colonoscopy  . Fibromyalgia   . Fibromyalgia   . History of GI diverticular bleed   . Hypercholesteremia   . Hypertension     Family History  Problem Relation Age of Onset  . Heart disease Mother   . Pancreatic cancer Mother   . Prostate cancer Father   . Healthy Sister   . Parkinson's disease Brother   . Asthma Sister   . Healthy Sister    Past  Surgical History:  Procedure Laterality Date  . allergy shots  weekly  . CHOLECYSTECTOMY    . COLONOSCOPY     Dr.Rehman  . COLONOSCOPY N/A 01/27/2015   Procedure: COLONOSCOPY;  Surgeon: Rogene Houston, MD;  Location: [facility];  Service: Endoscopy;  Laterality: N/A;  930  . EYE SURGERY Bilateral    partial  cornea transplants   . GALLBLADDER SURGERY  12/1993  . UPPER GASTROINTESTINAL ENDOSCOPY     Social History   Social History Narrative  . Not on file   Immunization History  Administered Date(s) Administered  . PFIZER SARS-COV-2 Vaccination 08/05/2019, 08/26/2019     Objective: Vital Signs: BP 118/72 (BP Location: Left Arm, Patient Position: Sitting, Cuff Size: Normal)   Pulse (!) 49   Resp 16   Ht _0  (1.803 m)   Wt 197 lb 3.2 oz (89.4 kg)   BMI 27.50 kg/m    Physical Exam Vitals and nursing note reviewed.  Constitutional:      Appearance: He is well-developed.  HENT:     Head: Normocephalic and atraumatic.  Eyes:     Conjunctiva/sclera: Conjunctivae normal.     Pupils: Pupils are equal, round, and reactive to light.  Pulmonary:     Effort: Pulmonary effort is normal.  Abdominal:     Palpations: Abdomen is soft.  Musculoskeletal:     Cervical back: Normal range of motion and neck supple.  Skin:    General: Skin is warm and dry.     Capillary Refill: Capillary refill takes less than 2 seconds.  Neurological:     Mental Status: He is alert and oriented to person, place, and time.  Psychiatric:        Behavior: Behavior normal.      Musculoskeletal Exam:  C-spine, thoracic spine, and lumbar spine good ROM.  Shoulder joints, elbow joints, wrist joints, MCPs, PIPs, and DIPs good ROM with no synovitis.  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  Hip joints, knee joints, and ankle joints good ROM with no discomfort.  No warmth or effusion of knee joints.  No tenderness or swelling of ankle joints.     CDAI Exam: CDAI Score: -- Patient Global: --; Provider Global: -- Swollen: --; Tender: -- Joint Exam 03/22/2020   No joint exam has been documented for this visit   There is currently no information documented on the homunculus. Go to the Rheumatology activity and complete the homunculus joint exam.  Investigation: No additional  findings.  Imaging: No results found.  Recent Labs: Lab Results  Component Value Date   WBC 9.4 12/18/2019   HGB 12.1 (L) 12/18/2019   PLT 242 12/18/2019   NA 129 (L) 12/18/2019   K 4.3 12/18/2019   CL 98 12/18/2019   CO2 25 12/18/2019   GLUCOSE 92 12/18/2019   BUN 13 12/18/2019   CREATININE 0.95 12/18/2019   BILITOT 0.5 12/18/2019   ALKPHOS 69 11/15/2016   AST 17 12/18/2019   ALT 15 12/18/2019   PROT 6.5 12/18/2019   ALBUMIN 3.5 11/15/2016   CALCIUM 9.1 12/18/2019   GFRAA 90 12/18/2019   QFTBGOLDPLUS NEGATIVE 08/21/2018    Speciality Comments: No specialty comments available.  Procedures:  No procedures performed Allergies: Plaquenil [hydroxychloroquine sulfate] and Lodine [etodolac]   Assessment / Plan:     Visit Diagnoses: Autoimmune disease ([place]) - ANA negative ENA negative, [name] phenomenon, beta-2 GP 1 IgM positive, anticardiolipin IgM positive, elevated ESR, severe inflammatory  arthritis: He has not had any signs or symptoms of a flare recently.  He is clinically doing well on methotrexate 8 tablets by mouth once weekly and folic acid 2 mg by mouth daily.  He is tolerating methotrexate without any side effects.  He has no joint pain and no synovitis was noted on examination today.  He continues to experience intermittent symptoms of [name] but no digital ulcerations or signs of gangrene were noted on exam.  He has chronic sicca symptoms and intermittent oral ulcerations.  He has not noticed any enlarged lymph nodes recently.  He is not experiencing any shortness of breath, chest pain, or palpitations at this time.  He has not had any recent rashes but has ongoing photosensitivity and tries to avoid direct sun exposure.  Lab work from 08/24/2019 and 12/18/19 was reviewed with the patient today in the office.  He is due to update lab work today so the following lab orders were released.  He was advised to notify us if he develops any new or worsening symptoms.  He will  continue taking methotrexate and folic acid as prescribed.  Refill of folic acid was sent to the pharmacy.  He will follow-up in the office in 5 months.- Plan: CBC with Differential/Platelet, COMPLETE METABOLIC PANEL WITH GFR, folic acid (FOLVITE) 1 MG tablet, CBC with Differential/Platelet, COMPLETE METABOLIC PANEL WITH GFR, Cardiolipin antibodies, IgG, IgM, IgA, Beta-2 glycoprotein antibodies  High risk medication use - MTX 8 tablets po once weekly, folic acid 2 mg po daily.  D/c PLQ-rash.  CBC and CMP were drawn on 7-21.  He is due to update lab work today.  Orders for CBC and CMP released.  He will be due for his next labs in December and every 3 months to monitor for drug toxicity.  Standing orders for CBC and CMP are in place.- Plan: CBC with Differential/Platelet, COMPLETE METABOLIC PANEL WITH GFR, CBC with Differential/Platelet, COMPLETE METABOLIC PANEL WITH GFR He has not had any recent infections.  He has received both COVID-19 vaccinations and plans on receiving his third dose.  He was advised to avoid taking Tylenol and NSAIDs 24 hours prior to the third dose.  He was also advised to hold methotrexate 1 week after receiving a third dose.  He was advised to notify us or his PCP if he develops a COVID-19 infection in order to receive the antibody infusion.  He was encouraged to continue to wear a mask and social distance. He voiced understanding.  [name] disease without gangrene: He has intermittent symptoms of [name].  No digital stations or signs of gangrene were noted on exam.  Good capillary refill noted.  He has no skin tightness or thickening on examination today.  We discussed the importance of avoiding triggers.  Fibromyalgia: His fibromyalgia has been tolerable recently.  He experiences intermittent myalgias and muscle tenderness.  He perform stretching exercises on a daily basis.  Other medical conditions are listed as follows:   History of diverticulitis  Essential  hypertension  History of iron deficiency anemia  History of multiple allergies  Fuchs' corneal dystrophy  History of sleep apnea  Hypercholesteremia    Orders: Orders Placed This Encounter  Procedures  . CBC with Differential/Platelet  . COMPLETE METABOLIC PANEL WITH GFR  . CBC with Differential/Platelet  . COMPLETE METABOLIC PANEL WITH GFR  . Cardiolipin antibodies, IgG, IgM, IgA  . Beta-2 glycoprotein antibodies   Meds ordered this encounter  Medications  . folic acid (FOLVITE) 1 MG  tablet    Sig: Take 2 tablets (2 mg total) by mouth daily.    Dispense:  180 tablet    Refill:  3      Follow-Up Instructions: Return in about 5 months (around 08/20/2020) for Autoimmune Disease, Fibromyalgia.   Ofilia Neas, PA-C  Note - This record has been created using Dragon software.  Chart creation errors have been sought, but may not always  have been located. Such creation errors do not reflect on  the standard of medical care.

## 2020-03-17 DIAGNOSIS — J301 Allergic rhinitis due to pollen: Secondary | ICD-10-CM | POA: Diagnosis not present

## 2020-03-17 DIAGNOSIS — J3089 Other allergic rhinitis: Secondary | ICD-10-CM | POA: Diagnosis not present

## 2020-03-17 DIAGNOSIS — J3081 Allergic rhinitis due to animal (cat) (dog) hair and dander: Secondary | ICD-10-CM | POA: Diagnosis not present

## 2020-03-22 ENCOUNTER — Encounter: Payer: Self-pay | Admitting: Physician Assistant

## 2020-03-22 ENCOUNTER — Other Ambulatory Visit: Payer: Self-pay

## 2020-03-22 ENCOUNTER — Ambulatory Visit (INDEPENDENT_AMBULATORY_CARE_PROVIDER_SITE_OTHER): Payer: Medicare HMO | Admitting: Physician Assistant

## 2020-03-22 VITALS — BP 118/72 | HR 49 | Resp 16 | Ht 71.0 in | Wt 197.2 lb

## 2020-03-22 DIAGNOSIS — I7300 Raynaud's syndrome without gangrene: Secondary | ICD-10-CM

## 2020-03-22 DIAGNOSIS — E7800 Pure hypercholesterolemia, unspecified: Secondary | ICD-10-CM

## 2020-03-22 DIAGNOSIS — M797 Fibromyalgia: Secondary | ICD-10-CM | POA: Diagnosis not present

## 2020-03-22 DIAGNOSIS — H18519 Endothelial corneal dystrophy, unspecified eye: Secondary | ICD-10-CM

## 2020-03-22 DIAGNOSIS — Z8719 Personal history of other diseases of the digestive system: Secondary | ICD-10-CM | POA: Diagnosis not present

## 2020-03-22 DIAGNOSIS — M359 Systemic involvement of connective tissue, unspecified: Secondary | ICD-10-CM | POA: Diagnosis not present

## 2020-03-22 DIAGNOSIS — Z9189 Other specified personal risk factors, not elsewhere classified: Secondary | ICD-10-CM

## 2020-03-22 DIAGNOSIS — Z862 Personal history of diseases of the blood and blood-forming organs and certain disorders involving the immune mechanism: Secondary | ICD-10-CM

## 2020-03-22 DIAGNOSIS — J301 Allergic rhinitis due to pollen: Secondary | ICD-10-CM | POA: Diagnosis not present

## 2020-03-22 DIAGNOSIS — J3089 Other allergic rhinitis: Secondary | ICD-10-CM | POA: Diagnosis not present

## 2020-03-22 DIAGNOSIS — J3081 Allergic rhinitis due to animal (cat) (dog) hair and dander: Secondary | ICD-10-CM | POA: Diagnosis not present

## 2020-03-22 DIAGNOSIS — Z79899 Other long term (current) drug therapy: Secondary | ICD-10-CM

## 2020-03-22 DIAGNOSIS — I10 Essential (primary) hypertension: Secondary | ICD-10-CM

## 2020-03-22 DIAGNOSIS — Z8669 Personal history of other diseases of the nervous system and sense organs: Secondary | ICD-10-CM

## 2020-03-22 DIAGNOSIS — Z889 Allergy status to unspecified drugs, medicaments and biological substances status: Secondary | ICD-10-CM

## 2020-03-22 MED ORDER — FOLIC ACID 1 MG PO TABS: 2.0000 mg | ORAL_TABLET | Freq: Every day | ORAL | 3 refills | Status: DC

## 2020-03-22 NOTE — Patient Instructions (Addendum)
COVID-19 vaccine recommendations:   COVID-19 vaccine is recommended for everyone (unless you are allergic to a vaccine component), even if you are on a medication that suppresses your immune system.   If you are on Methotrexate, Cellcept (mycophenolate), Rinvoq, Xeljanz, and Olumiant- hold the medication for 1 week after each vaccine. Hold Methotrexate for 2 weeks after the single dose COVID-19 vaccine.   If you are on Orencia subcutaneous injection - hold medication one week prior to and one week after the first COVID-19 vaccine dose (only).   If you are on Orencia IV infusions- time vaccination administration so that the first COVID-19 vaccination will occur four weeks after the infusion and postpone the subsequent infusion by one week.   If you are on Cyclophosphamide or Rituxan infusions please contact your doctor prior to receiving the COVID-19 vaccine.   Do not take Tylenol or any anti-inflammatory medications (NSAIDs) 24 hours prior to the COVID-19 vaccination.   There is no direct evidence about the efficacy of the COVID-19 vaccine in individuals who are on medications that suppress the immune system.   Even if you are fully vaccinated, and you are on any medications that suppress your immune system, please continue to wear a mask, maintain at least six feet social distance and practice hand hygiene.   If you develop a COVID-19 infection, please contact your PCP or our office to determine if you need antibody infusion.  The booster vaccine is now available for immunocompromised patients. It is advised that if you had Pfizer vaccine you should get Pfizer booster.  If you had a Moderna vaccine then you should get a Moderna booster. [name] and [name] does not have a booster vaccine at this time.  Please see the following web sites for updated information.    [email or web address]://[email or web address]-Vaccination-Patient-Resources.[email or web address]  [email or web address]  Standing Labs We placed an order today for your standing lab work.   Please have your standing labs drawn in January and every 3 months   If possible, please have your labs drawn 2 weeks prior to your appointment so that the provider can discuss your results at your appointment.  We have open lab daily Monday through Thursday from 8:30-12:30 PM and 1:30-4:30 PM and Friday from 8:30-12:30 PM and 1:30-4:00 PM at the office of Dr. Shaili Deveshwar, [facility].   Please be advised, patients with office appointments requiring lab work will take precedents over walk-in lab work.  If possible, please come for your lab work on Monday and Friday afternoons, as you may experience shorter wait times. The office is located at [address], [address], [place], [place] [address] No appointment is necessary.   Labs are drawn by [facility]. Please bring your co-pay at the time of your lab draw.  You may receive a bill from [facility] for your lab work.  If you wish to have your labs drawn at another location, please call the office 24 hours in advance to send orders.  If you have any questions regarding directions or hours of operation,  please call [phone].   As a reminder, please drink plenty of water prior to coming for your lab work. Thanks!   

## 2020-03-23 LAB — COMPLETE METABOLIC PANEL WITH GFR
AG Ratio: 1.6 (calc) (ref 1.0–2.5)
ALT: 16 U/L (ref 9–46)
AST: 17 U/L (ref 10–35)
Albumin: 4 g/dL (ref 3.6–5.1)
Alkaline phosphatase (APISO): 116 U/L (ref 35–144)
BUN: 20 mg/dL (ref 7–25)
CO2: 25 mmol/L (ref 20–32)
Calcium: 9.1 mg/dL (ref 8.6–10.3)
Chloride: 100 mmol/L (ref 98–110)
Creat: 0.96 mg/dL (ref 0.70–1.18)
GFR, Est African American: 89 mL/min/{1.73_m2} (ref >=60)
GFR, Est Non African American: 76 mL/min/{1.73_m2} (ref >=60)
Globulin: 2.5 g/dL (calc) (ref 1.9–3.7)
Glucose, Bld: 103 mg/dL — ABNORMAL HIGH (ref 65–99)
Potassium: 4.2 mmol/L (ref 3.5–5.3)
Sodium: 133 mmol/L — ABNORMAL LOW (ref 135–146)
Total Bilirubin: 0.7 mg/dL (ref 0.2–1.2)
Total Protein: 6.5 g/dL (ref 6.1–8.1)

## 2020-03-23 LAB — BETA-2 GLYCOPROTEIN ANTIBODIES
Beta-2 Glyco 1 IgA: 10.9 U/mL
Beta-2 Glyco 1 IgM: >112 U/mL — ABNORMAL HIGH
Beta-2 Glyco I IgG: 12 U/mL

## 2020-03-23 LAB — CBC WITH DIFFERENTIAL/PLATELET
Absolute Monocytes: 531 cells/uL (ref 200–950)
Basophils Absolute: 59 cells/uL (ref 0–200)
Basophils Relative: 1 %
Eosinophils Absolute: 183 cells/uL (ref 15–500)
Eosinophils Relative: 3.1 %
HCT: 37.2 % — ABNORMAL LOW (ref 38.5–50.0)
Hemoglobin: 12.4 g/dL — ABNORMAL LOW (ref 13.2–17.1)
Lymphs Abs: 938 cells/uL (ref 850–3900)
MCH: 30.4 pg (ref 27.0–33.0)
MCHC: 33.3 g/dL (ref 32.0–36.0)
MCV: 91.2 fL (ref 80.0–100.0)
MPV: 9.4 fL (ref 7.5–12.5)
Monocytes Relative: 9 %
Neutro Abs: 4189 cells/uL (ref 1500–7800)
Neutrophils Relative %: 71 %
Platelets: 229 10*3/uL (ref 140–400)
RBC: 4.08 10*6/uL — ABNORMAL LOW (ref 4.20–5.80)
RDW: 14.4 % (ref 11.0–15.0)
Total Lymphocyte: 15.9 %
WBC: 5.9 10*3/uL (ref 3.8–10.8)

## 2020-03-23 LAB — CARDIOLIPIN ANTIBODIES, IGG, IGM, IGA
Anticardiolipin IgA: 11.9 APL-U/mL
Anticardiolipin IgG: 16 GPL-U/mL
Anticardiolipin IgM: >112 MPL-U/mL — ABNORMAL HIGH

## 2020-03-23 NOTE — Progress Notes (Signed)
RBC count, hgb, and hct are low but improving.  Rest of CBC WNL.  Sodium is borderline low but improving.  Rest of CMP WNL.

## 2020-03-24 ENCOUNTER — Telehealth: Payer: Self-pay | Admitting: *Deleted

## 2020-03-24 DIAGNOSIS — M359 Systemic involvement of connective tissue, unspecified: Secondary | ICD-10-CM

## 2020-03-24 DIAGNOSIS — R760 Raised antibody titer: Secondary | ICD-10-CM

## 2020-03-24 NOTE — Progress Notes (Signed)
Reviewed lab work with Dr. Estanislado Pandy.   Beta-2 glycoprotein and cardiolipin IgM is positive and titers have trended up considerably.   Dr. Estanislado Pandy would like the patient to start taking aspirin 325 mg daily with food.  Please refer the patient to hematology for further evaluation and recommendations.

## 2020-03-24 NOTE — Telephone Encounter (Signed)
-----   Message from Ofilia Neas, PA-C sent at 03/24/2020 12:35 PM EDT ----- Reviewed lab work with Dr. Estanislado Pandy.   Beta-2 glycoprotein and cardiolipin IgM is positive and titers have trended up considerably.   Dr. Estanislado Pandy would like the patient to start taking aspirin 325 mg daily with food.  Please refer the patient to hematology for further evaluation and recommendations.

## 2020-03-29 DIAGNOSIS — J301 Allergic rhinitis due to pollen: Secondary | ICD-10-CM | POA: Diagnosis not present

## 2020-03-29 DIAGNOSIS — J3089 Other allergic rhinitis: Secondary | ICD-10-CM | POA: Diagnosis not present

## 2020-03-29 DIAGNOSIS — J3081 Allergic rhinitis due to animal (cat) (dog) hair and dander: Secondary | ICD-10-CM | POA: Diagnosis not present

## 2020-04-04 DIAGNOSIS — J3081 Allergic rhinitis due to animal (cat) (dog) hair and dander: Secondary | ICD-10-CM | POA: Diagnosis not present

## 2020-04-04 DIAGNOSIS — J3089 Other allergic rhinitis: Secondary | ICD-10-CM | POA: Diagnosis not present

## 2020-04-04 DIAGNOSIS — J301 Allergic rhinitis due to pollen: Secondary | ICD-10-CM | POA: Diagnosis not present

## 2020-04-12 DIAGNOSIS — J3081 Allergic rhinitis due to animal (cat) (dog) hair and dander: Secondary | ICD-10-CM | POA: Diagnosis not present

## 2020-04-12 DIAGNOSIS — J3089 Other allergic rhinitis: Secondary | ICD-10-CM | POA: Diagnosis not present

## 2020-04-12 DIAGNOSIS — J301 Allergic rhinitis due to pollen: Secondary | ICD-10-CM | POA: Diagnosis not present

## 2020-04-16 DIAGNOSIS — E7849 Other hyperlipidemia: Secondary | ICD-10-CM | POA: Diagnosis not present

## 2020-04-16 DIAGNOSIS — I10 Essential (primary) hypertension: Secondary | ICD-10-CM | POA: Diagnosis not present

## 2020-04-18 ENCOUNTER — Other Ambulatory Visit: Payer: Self-pay

## 2020-04-18 ENCOUNTER — Inpatient Hospital Stay (HOSPITAL_COMMUNITY): Payer: Medicare HMO | Attending: Hematology | Admitting: Hematology

## 2020-04-18 VITALS — BP 144/84 | HR 56 | Temp 97.0°F | Resp 19 | Ht 71.0 in | Wt 196.0 lb

## 2020-04-18 DIAGNOSIS — R5383 Other fatigue: Secondary | ICD-10-CM | POA: Diagnosis not present

## 2020-04-18 DIAGNOSIS — I7300 Raynaud's syndrome without gangrene: Secondary | ICD-10-CM | POA: Insufficient documentation

## 2020-04-18 DIAGNOSIS — M797 Fibromyalgia: Secondary | ICD-10-CM | POA: Insufficient documentation

## 2020-04-18 DIAGNOSIS — Z79899 Other long term (current) drug therapy: Secondary | ICD-10-CM | POA: Diagnosis not present

## 2020-04-18 DIAGNOSIS — Z9049 Acquired absence of other specified parts of digestive tract: Secondary | ICD-10-CM | POA: Insufficient documentation

## 2020-04-18 DIAGNOSIS — E7800 Pure hypercholesterolemia, unspecified: Secondary | ICD-10-CM | POA: Insufficient documentation

## 2020-04-18 DIAGNOSIS — M359 Systemic involvement of connective tissue, unspecified: Secondary | ICD-10-CM | POA: Diagnosis not present

## 2020-04-18 DIAGNOSIS — R238 Other skin changes: Secondary | ICD-10-CM | POA: Insufficient documentation

## 2020-04-18 DIAGNOSIS — Z7982 Long term (current) use of aspirin: Secondary | ICD-10-CM

## 2020-04-18 DIAGNOSIS — R760 Raised antibody titer: Secondary | ICD-10-CM | POA: Diagnosis not present

## 2020-04-18 DIAGNOSIS — Z8042 Family history of malignant neoplasm of prostate: Secondary | ICD-10-CM | POA: Insufficient documentation

## 2020-04-18 DIAGNOSIS — I10 Essential (primary) hypertension: Secondary | ICD-10-CM | POA: Diagnosis not present

## 2020-04-18 DIAGNOSIS — Z800 Family history of malignant neoplasm of digestive organs: Secondary | ICD-10-CM | POA: Diagnosis not present

## 2020-04-18 DIAGNOSIS — M1990 Unspecified osteoarthritis, unspecified site: Secondary | ICD-10-CM | POA: Insufficient documentation

## 2020-04-18 NOTE — Progress Notes (Signed)
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 male is here because of evaluation of autoimmune disease and anti-cardiolipin antibodies positivity, at the request of Hazel Sams, [place] from [facility].  Today he reports feeling well. He sees Dr. 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 He also complains of aches all over his body due to fibromyalgia and occasional dandruff. He also reports changes in the skin color of his fingers whenever it gets cold. He takes ASA 325 since 10/05 and denies MI's or CVA's. He denies unexplained weight loss recently, F/C, rashes, ankle swelling or changes in overall health. His energy levels are low and he complains of not having the drive to do anything. He had a blood transfusion after his cholecystectomy in 1995.  He denies presence of lupus, DVT's, or recurrent miscarriages in his family. His father had prostate cancer; his mother had pancreatic cancer; several paternal aunts had some kind of cancer. He lives at home with his wife. He used to work in [employer]. He is a non-smoker.   MEDICAL HISTORY:  Past Medical History:  Diagnosis Date  . Diverticula of colon    2016 colonoscopy  . Fibromyalgia   . Fibromyalgia   . History of GI diverticular bleed   . Hypercholesteremia   . Hypertension     SURGICAL HISTORY: Past Surgical History:  Procedure Laterality Date  . allergy shots  weekly  . CHOLECYSTECTOMY    . COLONOSCOPY     Dr.Rehman  . COLONOSCOPY N/A  01/27/2015   Procedure: COLONOSCOPY;  Surgeon: Rogene Houston, MD;  Location: [facility];  Service: Endoscopy;  Laterality: N/A;  930  . EYE SURGERY Bilateral    partial cornea transplants   . GALLBLADDER SURGERY  12/1993  . UPPER GASTROINTESTINAL ENDOSCOPY      SOCIAL HISTORY: Social History   Socioeconomic History  . Marital status: Married    Spouse name: Not on file  . Number of children: Not on file  . Years of education: Not on file  . Highest education level: Not on file  Occupational History  . Not on file  Tobacco Use  . Smoking status: Never Smoker  . Smokeless tobacco: Never Used  Vaping Use  . Vaping Use: Never used  Substance and Sexual Activity  . Alcohol use: Yes    Alcohol/week: 0.0 standard drinks    Comment: occas  . Drug use: No  . Sexual activity: Not on file  Other Topics Concern  . Not on file  Social History Narrative  . Not on file   Social Determinants of Health   Financial Resource Strain:   . Difficulty of Paying Living Expenses: Not on file  Food Insecurity:   . Worried About Charity fundraiser in the Last Year: Not on file  . Ran Out of Food in the Last Year: Not on file  Transportation Needs:   . Lack of Transportation (Medical): Not on file  . Lack of Transportation (Non-Medical): Not on file  Physical Activity:   . Days of  Exercise per Week: Not on file  . Minutes of Exercise per Session: Not on file  Stress:   . Feeling of Stress : Not on file  Social Connections:   . Frequency of Communication with Friends and Family: Not on file  . Frequency of Social Gatherings with Friends and Family: Not on file  . Attends Religious Services: Not on file  . Active Member of Clubs or Organizations: Not on file  . Attends Archivist Meetings: Not on file  . Marital Status: Not on file  Intimate Partner Violence:   . Fear of Current or Ex-Partner: Not on file  . Emotionally Abused: Not on file  . Physically Abused: Not on  file  . Sexually Abused: Not on file    FAMILY HISTORY: Family History  Problem Relation Age of Onset  . Heart disease Mother   . Pancreatic cancer Mother   . Prostate cancer Father   . Healthy Sister   . Parkinson's disease Brother   . Asthma Sister   . Healthy Sister     ALLERGIES:  is allergic to plaquenil [hydroxychloroquine sulfate] and lodine [etodolac].  MEDICATIONS:  Current Outpatient Medications  Medication Sig Dispense Refill  . atenolol (TENORMIN) 25 MG tablet TAKE (1) TABLET BY MOUTH ONCE DAILY. 90 tablet 3  . azelastine (ASTELIN) 0.1 % nasal spray Place 1 spray into both nostrils 2 (two) times daily.    . Azelastine-Fluticasone (DYMISTA) 137-50 MCG/ACT SUSP Place into the nose 2 (two) times daily.     Marland Kitchen dicyclomine (BENTYL) 10 MG capsule Take 1 capsule (10 mg total) by mouth daily before breakfast. 30 capsule 1  . diphenhydrAMINE (BENADRYL) 25 MG tablet Take 25 mg by mouth as needed for allergies.     Marland Kitchen FLUoxetine (PROZAC) 10 MG tablet Take 10 mg by mouth daily.    . folic acid (FOLVITE) 1 MG tablet Take 2 tablets (2 mg total) by mouth daily. 180 tablet 3  . hydrOXYzine (ATARAX/VISTARIL) 25 MG tablet Take 25 mg by mouth every 8 (eight) hours as needed.    . Investigational - Study Medication Study name: atorvastatin 40mg  or placebo.    . levocetirizine (XYZAL) 5 MG tablet Take 5 mg by mouth every evening.     Marland Kitchen losartan (COZAAR) 50 MG tablet Take 100 mg by mouth every morning.     . methotrexate (RHEUMATREX) 2.5 MG tablet TAKE 8 TABLETS BY MOUTH ONCE WEEKLY. 96 tablet 0  . montelukast (SINGULAIR) 10 MG tablet Take 10 mg by mouth at bedtime.    . Omega-3 Fatty Acids (FISH OIL) 1000 MG CAPS Take 1 capsule by mouth 2 (two) times daily.    . Testosterone (ANDROGEL TD) Place onto the skin daily.    . triazolam (HALCION) 0.25 MG tablet Take 0.25 mg by mouth at bedtime.     Marland Kitchen amLODipine (NORVASC) 5 MG tablet Take 1 tablet (5 mg total) by mouth daily. 90 tablet 3  .  EPINEPHrine 0.3 mg/0.3 mL IJ SOAJ injection INJECT INTO THIGH ASONEEDED FOR ALLERGIC REACTION. (Patient not taking: Reported on 04/18/2020)     No current facility-administered medications for this visit.    REVIEW OF SYSTEMS:   Review of Systems  Constitutional: Positive for appetite change (80%) and fatigue (50%). Negative for chills, fever and unexpected weight change.  Cardiovascular: Negative for leg swelling.  Genitourinary: Positive for frequency.   Musculoskeletal: Positive for myalgias (4/10 achy all over d/t fibromyalgia).  Skin: Negative for rash.  Neurological:  Changes in color of fingers in cold  All other systems reviewed and are negative.    PHYSICAL EXAMINATION: ECOG PERFORMANCE STATUS: 1 - Symptomatic but completely ambulatory  Vitals:   04/18/20 1331  BP: (!) 144/84  Pulse: (!) 56  Resp: 19  Temp: (!) 97 F (36.1 C)  SpO2: 98%   Filed Weights   04/18/20 1331  Weight: 196 lb (88.9 kg)   Physical Exam Vitals reviewed.  Constitutional:      Appearance: Normal appearance.  Cardiovascular:     Rate and Rhythm: Normal rate and regular rhythm.     Pulses: Normal pulses.     Heart sounds: Normal heart sounds.  Pulmonary:     Effort: Pulmonary effort is normal.     Breath sounds: Normal breath sounds.  Abdominal:     Palpations: Abdomen is soft. There is no hepatomegaly, splenomegaly or mass.     Tenderness: There is no abdominal tenderness.     Hernia: No hernia is present.  Musculoskeletal:     Right lower leg: No edema.     Left lower leg: No edema.  Lymphadenopathy:     Upper Body:     Right upper body: No supraclavicular, axillary or pectoral adenopathy.     Left upper body: No supraclavicular, axillary or pectoral adenopathy.  Skin:    Findings: Rash (erythematous rash on medial ankles bilat) present.  Neurological:     General: No focal deficit present.     Mental Status: He is alert and oriented to person, place, and time.   Psychiatric:        Mood and Affect: Mood normal.        Behavior: Behavior normal.      LABORATORY DATA:  I have reviewed the data as listed Recent Results (from the past 2160 hour(s))  CBC with Differential/Platelet     Status: Abnormal   Collection Time: 03/22/20  9:46 AM  Result Value Ref Range   WBC 5.9 3.8 - 10.8 Thousand/uL   RBC 4.08 (L) 4.20 - 5.80 Million/uL   Hemoglobin 12.4 (L) 13.2 - 17.1 g/dL   HCT 37.2 (L) 38 - 50 %   MCV 91.2 80.0 - 100.0 fL   MCH 30.4 27.0 - 33.0 pg   MCHC 33.3 32.0 - 36.0 g/dL   RDW 14.4 11.0 - 15.0 %   Platelets 229 140 - 400 Thousand/uL   MPV 9.4 7.5 - 12.5 fL   Neutro Abs 4,189 1,500 - 7,800 cells/uL   Lymphs Abs 938 850 - 3,900 cells/uL   Absolute Monocytes 531 200 - 950 cells/uL   Eosinophils Absolute 183 15.0 - 500.0 cells/uL   Basophils Absolute 59 0.0 - 200.0 cells/uL   Neutrophils Relative % 71 %   Total Lymphocyte 15.9 %   Monocytes Relative 9.0 %   Eosinophils Relative 3.1 %   Basophils Relative 1.0 %  COMPLETE METABOLIC PANEL WITH GFR     Status: Abnormal   Collection Time: 03/22/20  9:46 AM  Result Value Ref Range   Glucose, Bld 103 (H) 65 - 99 mg/dL    Comment: .            Fasting reference interval . For someone without known diabetes, a glucose value between 100 and 125 mg/dL is consistent with prediabetes and should be confirmed with a follow-up test. .    BUN 20 7 - 25 mg/dL   Creat 0.96 0.70 - 1.18 mg/dL    Comment: For patients >  2 years of age, the reference limit for Creatinine is approximately 13% higher for people identified as African-American. .    GFR, Est Non African American 76 > OR = 60 mL/min/1.5m2   GFR, Est African American 89 > OR = 60 mL/min/1.74m2   BUN/Creatinine Ratio NOT APPLICABLE 6 - 22 (calc)   Sodium 133 (L) 135 - 146 mmol/L   Potassium 4.2 3.5 - 5.3 mmol/L   Chloride 100 98 - 110 mmol/L   CO2 25 20 - 32 mmol/L   Calcium 9.1 8.6 - 10.3 mg/dL   Total Protein 6.5 6.1 - 8.1 g/dL    Albumin 4.0 3.6 - 5.1 g/dL   Globulin 2.5 1.9 - 3.7 g/dL (calc)   AG Ratio 1.6 1.0 - 2.5 (calc)   Total Bilirubin 0.7 0.2 - 1.2 mg/dL   Alkaline phosphatase (APISO) 116 35 - 144 U/L   AST 17 10 - 35 U/L   ALT 16 9 - 46 U/L  Cardiolipin antibodies, IgG, IgM, IgA     Status: Abnormal   Collection Time: 03/22/20  9:46 AM  Result Value Ref Range   Anticardiolipin IgA 11.9 APL-U/mL    Comment: Value          Interpretation -----          -------------- < 20.0         Antibody not detected > or = 20.0    Antibody detected .    Anticardiolipin IgG 16.0 GPL-U/mL    Comment: Value          Interpretation -----          -------------- < 20.0         Antibody not detected > or = 20.0    Antibody detected .    Anticardiolipin IgM >112.0 (H) MPL-U/mL    Comment: Value          Interpretation -----          -------------- < 20.0         Antibody not detected > or = 20.0    Antibody detected . Marland Kitchen The antiphospholipid antibody syndrome (APS) is a  clinical-pathologic correlation that includes a  clinical event (e.g. arterial or venous thrombosis,  pregnancy morbidity) and persistent positive antiphospholipid antibodies (IgM or IgG  ACA >40 MPL/GPL-U/mL, IgM or IgG anti-b2GPI  antibodies or a lupus anticoagulant). International  consensus guidelines for APS suggest waiting at  least 12 weeks before retesting to confirm antibody  persistence.  The [employer]  Collaborating Clinics immunological classification  criteria for systemic lupus erythematosus (SLE)  include testing for isotype IgA, which has yet to be incorporated into APS criteria. Low level  antiphospholipid antibodies may sometimes be  detected in the setting of infection, drug therapy  or aging. . For additional information, please refer to htt p://education.quest[facility].com/faq/FAQ109 (This link is being provided for informational/ educational purposes only.)   Beta-2 glycoprotein antibodies      Status: Abnormal   Collection Time: 03/22/20  9:46 AM  Result Value Ref Range   Beta-2 Glyco I IgG 12.0 U/mL    Comment: Value          Interpretation -----          -------------- < 20.0         Antibody not detected > or = 20.0    Antibody detected .    Beta-2 Glyco 1 IgM >112.0 (H) U/mL    Comment: Value  Interpretation -----          -------------- < 20.0         Antibody not detected > or = 20.0    Antibody detected .    Beta-2 Glyco 1 IgA 10.9 U/mL    Comment: Value          Interpretation -----          -------------- < 20.0         Antibody not detected > or = 20.0    Antibody detected . Marland Kitchen The antiphospholipid antibody syndrome (APS) is a  clinical-pathologic correlation that includes a  clinical event (e.g. arterial or venous thrombosis,  pregnancy morbidity) and persistent positive antiphospholipid antibodies (IgM or IgG  ACA >40 MPL/GPL-U/mL, IgM or IgG anti-b2GPI  antibodies or a lupus anticoagulant). International  consensus guidelines for APS suggest waiting at  least 12 weeks before retesting to confirm antibody  persistence.  The [employer]  Collaborating Clinics immunological classification  criteria for systemic lupus erythematosus (SLE)  include testing for isotype IgA, which has yet to be incorporated into APS criteria. Low level  antiphospholipid antibodies may sometimes be  detected in the setting of infection, drug therapy  or aging. . For additional information, please refer to htt p://education.quest[facility].com/faq/FAQ109 (This link is being provided for informational/ educational purposes only.) . The antiphospholipid antibody syndrome (APS) is a  clinical-pathologic correlation that includes a  clinical event (e.g. arterial or venous thrombosis,  pregnancy morbidity) and persistent positive antiphospholipid antibodies (IgM or IgG  ACA >40 MPL/GPL-U/mL, IgM or IgG anti-b2GPI  antibodies or a lupus  anticoagulant). International  consensus guidelines for APS suggest waiting at  least 12 weeks before retesting to confirm antibody  persistence.  The [employer]  Collaborating Clinics immunological classification  criteria for systemic lupus erythematosus (SLE)  include testing for isotype IgA, which has yet to be incorporated into APS criteria. Low level  antiphospholipid antibodies may sometimes be  detected in the setting of infection, drug therapy  or aging. . For additional information, please refer to http://educatio n.questdiagn[facility].com/faq/FAQ109 (This link is being provided for informational/ educational purposes only.) . The antiphospholipid antibody syndrome (APS) is a  clinical-pathologic correlation that includes a  clinical event (e.g. arterial or venous thrombosis,  pregnancy morbidity) and persistent positive antiphospholipid antibodies (IgM or IgG  ACA >40 MPL/GPL-U/mL, IgM or IgG anti-b2GPI  antibodies or a lupus anticoagulant). International  consensus guidelines for APS suggest waiting at  least 12 weeks before retesting to confirm antibody  persistence.  The [employer]up[employer]  Collaborating Clinics immunological classification  criteria for systemic lupus erythematosus (SLE)  include testing for isotype IgA, which has yet to be incorporated into APS criteria. Low level  antiphospholipid antibodies may sometimes be  detected in the setting of infection, drug therapy  or aging. . For additional information, please refer to http://education.[facility].com/faq/FAQ109 (This link is being provided for informational/ educational purposes only.)     RADIOGRAPHIC STUDIES: I have personally reviewed the radiological images as listed and agreed with the findings in the report.  ASSESSMENT:  1.  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  He has intermittent [name] phenomena. -Currently being treated with methotrexate 8 tablets/week under the direction of Dr. Estanislado Pandy.  Methotrexate has helped with joint pains. -Patient had positive antibeta-2 glycoprotein 1 IgM antibody (greater than 112) twice in March and July of this year.  He also has anticardiolipin antibody IgM positive (greater than 112) on 2 different occasions at least 3 months apart.  Lupus anticoagulant was negative twice. -He does not have any history of arterial or venous thrombosis.  No history of strokes. -No evidence of renal involvement or lung involvement.  Echocardiogram in 2018 did not show any major valvular involvement. -He has mild erythematous maculopapular rash about the medial ankles.  2.  Social/family history: -He used to work in [employer] prior to retirement.  Lives at home with his wife.  Non-smoker. -No family history of lupus anticoagulant or miscarriages or DVTs.  Father had prostate cancer and mother had pancreatic cancer.  Several paternal aunts had cancer, unknown type to the patient.   PLAN:  1.  Positive antiphospholipid antibody (aPL) without clinical criteria for antiphospholipid syndrome: -We have discussed various clinical features of antiphospholipid syndrome including arterial and venous thrombosis, lung, kidney and hepatic involvement. -Based on several studies, patients with positive antiphospholipid antibody and other connective tissue disorders are at increased risk for thrombosis.  Educated patient about high risk of thrombosis and seek immediate medical attention for any leg swellings, pain in the legs, pleuritic chest pain and shortness of breath. -Primary prophylaxis with low-dose aspirin is a reasonable alternative in this patient with high risk aPL profile (persistently high aPL titers, double aPL positivity). -We will reevaluate him in 6 months with repeat titers.   Derek Jack, MD 04/18/20 2:40 PM  [facility] [phone]   I, Milinda Antis, am acting as a scribe for Dr. Sanda Linger.  I, Derek Jack MD, have reviewed the above documentation for accuracy and completeness, and I agree with the above.

## 2020-04-18 NOTE — Patient Instructions (Signed)
[place] at [facility] Discharge Instructions  You were seen and examined today by Dr. Delton Coombes. Dr. Delton Coombes is a hematologist, meaning he specializes in blood disorders. Dr. Delton Coombes discussed your past medical history, family history of blood disorders and cancer, and the events that led to you being here today.  Blood work done by your rheumatologist revealed Antiphospholipid antibody positive, which is a blood condition that predisposes you to easily forming blood clots, which can occur in either the veins or arteries. Should you develop a blood clot, you will be on a lifelong blood thinner, but this is not necessary right now. Continue to take methotrexate and aspirin as prescribed.  Essentially this means that you have a blood disorder that is not causing you any issues right now. We will monitor lab work and act if necessary.  You will return to the clinic in 6 months with lab work.   Thank you for choosing [place] [facility] to provide your oncology and hematology care.  To afford each patient quality time with our provider, please arrive at least 15 minutes before your scheduled appointment time.   If you have a lab appointment with the [facility] please come in thru the Main Entrance and check in at the main information desk.  You need to re-schedule your appointment should you arrive 10 or more minutes late.  We strive to give you quality time with our providers, and arriving late affects you and other patients whose appointments are after yours.  Also, if you no show three or more times for appointments you may be dismissed from the clinic at the providers discretion.     Again, thank you for choosing [facility].  Our hope is that these requests will decrease the amount of time that you wait before being seen by our physicians.       _____________________________________________________________  Should you  have questions after your visit to [facility], please contact our office at [phone] and follow the prompts.  Our office hours are 8:00 a.m. and 4:30 p.m. Monday - Friday.  Please note that voicemails left after 4:00 p.m. may not be returned until the following business day.  We are closed weekends and major holidays.  You do have access to a nurse 24-7, just call the main number to the clinic [phone] and do not press any options, hold on the line and a nurse will answer the phone.    For prescription refill requests, have your pharmacy contact our office and allow 72 hours.    Due to Covid, you will need to wear a mask upon entering the hospital. If you do not have a mask, a mask will be given to you at the Main Entrance upon arrival. For doctor visits, patients may have 1 support person age 85 or older with them. For treatment visits, patients can not have anyone with them due to social distancing guidelines and our immunocompromised population.

## 2020-04-21 DIAGNOSIS — B355 Tinea imbricata: Secondary | ICD-10-CM | POA: Diagnosis not present

## 2020-04-21 DIAGNOSIS — E663 Overweight: Secondary | ICD-10-CM | POA: Diagnosis not present

## 2020-04-21 DIAGNOSIS — Z6827 Body mass index (BMI) 27.0-27.9, adult: Secondary | ICD-10-CM | POA: Diagnosis not present

## 2020-04-22 DIAGNOSIS — J3081 Allergic rhinitis due to animal (cat) (dog) hair and dander: Secondary | ICD-10-CM | POA: Diagnosis not present

## 2020-04-22 DIAGNOSIS — J3089 Other allergic rhinitis: Secondary | ICD-10-CM | POA: Diagnosis not present

## 2020-04-22 DIAGNOSIS — J301 Allergic rhinitis due to pollen: Secondary | ICD-10-CM | POA: Diagnosis not present

## 2020-04-25 ENCOUNTER — Other Ambulatory Visit: Payer: Self-pay | Admitting: Physician Assistant

## 2020-04-25 DIAGNOSIS — M359 Systemic involvement of connective tissue, unspecified: Secondary | ICD-10-CM

## 2020-04-25 NOTE — Telephone Encounter (Signed)
Last Visit: 03/22/2020 Next Visit: 08/25/2020 Labs: 03/22/2020 RBC count, hgb, and hct are low but improving. Rest of CBC WNL. Sodium is borderline low but improving. Rest of CMP WNL  Current Dose per office note 03/22/2020: MTX 8 tablets po once weekly DX: Autoimmune disease   Okay to refill MTX?

## 2020-04-26 DIAGNOSIS — J301 Allergic rhinitis due to pollen: Secondary | ICD-10-CM | POA: Diagnosis not present

## 2020-04-26 DIAGNOSIS — J3089 Other allergic rhinitis: Secondary | ICD-10-CM | POA: Diagnosis not present

## 2020-04-26 DIAGNOSIS — J3081 Allergic rhinitis due to animal (cat) (dog) hair and dander: Secondary | ICD-10-CM | POA: Diagnosis not present

## 2020-05-02 DIAGNOSIS — J3081 Allergic rhinitis due to animal (cat) (dog) hair and dander: Secondary | ICD-10-CM | POA: Diagnosis not present

## 2020-05-02 DIAGNOSIS — J3089 Other allergic rhinitis: Secondary | ICD-10-CM | POA: Diagnosis not present

## 2020-05-02 DIAGNOSIS — J301 Allergic rhinitis due to pollen: Secondary | ICD-10-CM | POA: Diagnosis not present

## 2020-05-11 DIAGNOSIS — J3089 Other allergic rhinitis: Secondary | ICD-10-CM | POA: Diagnosis not present

## 2020-05-11 DIAGNOSIS — J3081 Allergic rhinitis due to animal (cat) (dog) hair and dander: Secondary | ICD-10-CM | POA: Diagnosis not present

## 2020-05-11 DIAGNOSIS — J301 Allergic rhinitis due to pollen: Secondary | ICD-10-CM | POA: Diagnosis not present

## 2020-05-17 DIAGNOSIS — E7849 Other hyperlipidemia: Secondary | ICD-10-CM | POA: Diagnosis not present

## 2020-05-17 DIAGNOSIS — I10 Essential (primary) hypertension: Secondary | ICD-10-CM | POA: Diagnosis not present

## 2020-05-18 DIAGNOSIS — J301 Allergic rhinitis due to pollen: Secondary | ICD-10-CM | POA: Diagnosis not present

## 2020-05-18 DIAGNOSIS — J3089 Other allergic rhinitis: Secondary | ICD-10-CM | POA: Diagnosis not present

## 2020-05-18 DIAGNOSIS — J3081 Allergic rhinitis due to animal (cat) (dog) hair and dander: Secondary | ICD-10-CM | POA: Diagnosis not present

## 2020-05-23 DIAGNOSIS — G4733 Obstructive sleep apnea (adult) (pediatric): Secondary | ICD-10-CM | POA: Diagnosis not present

## 2020-05-26 DIAGNOSIS — J3089 Other allergic rhinitis: Secondary | ICD-10-CM | POA: Diagnosis not present

## 2020-05-26 DIAGNOSIS — J3081 Allergic rhinitis due to animal (cat) (dog) hair and dander: Secondary | ICD-10-CM | POA: Diagnosis not present

## 2020-05-26 DIAGNOSIS — J301 Allergic rhinitis due to pollen: Secondary | ICD-10-CM | POA: Diagnosis not present

## 2020-05-31 DIAGNOSIS — J3081 Allergic rhinitis due to animal (cat) (dog) hair and dander: Secondary | ICD-10-CM | POA: Diagnosis not present

## 2020-05-31 DIAGNOSIS — J301 Allergic rhinitis due to pollen: Secondary | ICD-10-CM | POA: Diagnosis not present

## 2020-05-31 DIAGNOSIS — J3089 Other allergic rhinitis: Secondary | ICD-10-CM | POA: Diagnosis not present

## 2020-06-06 DIAGNOSIS — J3081 Allergic rhinitis due to animal (cat) (dog) hair and dander: Secondary | ICD-10-CM | POA: Diagnosis not present

## 2020-06-06 DIAGNOSIS — J301 Allergic rhinitis due to pollen: Secondary | ICD-10-CM | POA: Diagnosis not present

## 2020-06-06 DIAGNOSIS — J3089 Other allergic rhinitis: Secondary | ICD-10-CM | POA: Diagnosis not present

## 2020-06-17 DIAGNOSIS — I10 Essential (primary) hypertension: Secondary | ICD-10-CM | POA: Diagnosis not present

## 2020-06-17 DIAGNOSIS — E7849 Other hyperlipidemia: Secondary | ICD-10-CM | POA: Diagnosis not present

## 2020-06-21 DIAGNOSIS — J301 Allergic rhinitis due to pollen: Secondary | ICD-10-CM | POA: Diagnosis not present

## 2020-06-21 DIAGNOSIS — J3081 Allergic rhinitis due to animal (cat) (dog) hair and dander: Secondary | ICD-10-CM | POA: Diagnosis not present

## 2020-06-21 DIAGNOSIS — J3089 Other allergic rhinitis: Secondary | ICD-10-CM | POA: Diagnosis not present

## 2020-06-27 ENCOUNTER — Encounter: Payer: Self-pay | Admitting: Dermatology

## 2020-06-27 ENCOUNTER — Ambulatory Visit: Payer: Medicare HMO | Admitting: Dermatology

## 2020-06-27 ENCOUNTER — Other Ambulatory Visit: Payer: Self-pay

## 2020-06-27 DIAGNOSIS — J3081 Allergic rhinitis due to animal (cat) (dog) hair and dander: Secondary | ICD-10-CM | POA: Diagnosis not present

## 2020-06-27 DIAGNOSIS — L299 Pruritus, unspecified: Secondary | ICD-10-CM

## 2020-06-27 DIAGNOSIS — J301 Allergic rhinitis due to pollen: Secondary | ICD-10-CM | POA: Diagnosis not present

## 2020-06-27 DIAGNOSIS — L2089 Other atopic dermatitis: Secondary | ICD-10-CM

## 2020-06-27 DIAGNOSIS — J3089 Other allergic rhinitis: Secondary | ICD-10-CM | POA: Diagnosis not present

## 2020-06-27 DIAGNOSIS — Z1283 Encounter for screening for malignant neoplasm of skin: Secondary | ICD-10-CM

## 2020-06-27 MED ORDER — DOXEPIN HCL 5 % EX CREA: 1.0000 "application " | TOPICAL_CREAM | Freq: Every day | CUTANEOUS | 2 refills | Status: DC

## 2020-06-27 MED ORDER — CLOBETASOL PROPIONATE 0.05 % EX SOLN: 1.0000 "application " | Freq: Two times a day (BID) | CUTANEOUS | 0 refills | Status: DC

## 2020-06-27 NOTE — Patient Instructions (Signed)
Hydrocortisone

## 2020-07-05 ENCOUNTER — Ambulatory Visit: Payer: Medicare HMO | Admitting: Family Medicine

## 2020-07-05 DIAGNOSIS — J301 Allergic rhinitis due to pollen: Secondary | ICD-10-CM | POA: Diagnosis not present

## 2020-07-05 DIAGNOSIS — J3081 Allergic rhinitis due to animal (cat) (dog) hair and dander: Secondary | ICD-10-CM | POA: Diagnosis not present

## 2020-07-05 DIAGNOSIS — J3089 Other allergic rhinitis: Secondary | ICD-10-CM | POA: Diagnosis not present

## 2020-07-11 DIAGNOSIS — J301 Allergic rhinitis due to pollen: Secondary | ICD-10-CM | POA: Diagnosis not present

## 2020-07-11 DIAGNOSIS — J3089 Other allergic rhinitis: Secondary | ICD-10-CM | POA: Diagnosis not present

## 2020-07-11 DIAGNOSIS — J3081 Allergic rhinitis due to animal (cat) (dog) hair and dander: Secondary | ICD-10-CM | POA: Diagnosis not present

## 2020-07-14 NOTE — Progress Notes (Signed)
Cardiology Office Note  Date: 07/15/2020   ID: Cole Brown, DOB 12-21-1945, MRN [id number]  PCP:  Redmond School, MD  Cardiologist:  Carlyle Dolly, MD Electrophysiologist:  None   Chief Complaint: Cardiac follow-up  History of Present Illness: Cole Brown is a 75 y.o. male with a history of hypertension, palpitation, chest pain, hyperlipidemia, autoimmune arthritis, fibromyalgia.  Last encounter with Dr. Harl Bowie 01/30/2019.  He experienced a chest tightness episodes about 3 weeks prior to the visit.  Felt associated lightheadedness and confusion.  Denied any palpitations.  Stated tightness last about 20 minutes.  Had other episodes of tightness since that time.  Last echocardiogram 2018 EF of 6065%, abnormal diastolic function.  Denies any chest pain.  Following with rheumatology for autoimmune arthritis with [name] and fibromyalgia.  He was compliant with his antihypertensive medications.  From chart review blood pressure was elevated at other provider appointments.  Norvasc 5 mg daily was started.  Palpitations and chest pain seem to correlate with severe allergies at the time with no recurrence.  No recent symptoms.  He is here today for 1 year follow-up.  He denies any recent acute illnesses or hospitalizations.  He denies any anginal or exertional symptoms, palpitations or arrhythmias, orthostatic symptoms, CVA or TIA-like symptoms, PND, orthopnea, bleeding, claudication-like symptoms, DVT or PE-like symptoms or lower extremity edema.  He states he is not not taking his losartan anymore.  He continues his amlodipine 5 mg and atenolol 25 mg daily.  States he has not seen his PCP in a while.    Past Medical History:  Diagnosis Date  . Diverticula of colon    2016 colonoscopy  . Fibromyalgia   . Fibromyalgia   . History of GI diverticular bleed   . Hypercholesteremia   . Hypertension     Past Surgical History:  Procedure Laterality Date  . allergy shots  weekly  .  CHOLECYSTECTOMY    . COLONOSCOPY     Dr.Rehman  . COLONOSCOPY N/A 01/27/2015   Procedure: COLONOSCOPY;  Surgeon: Rogene Houston, MD;  [facility]: [facility];  Service: Endoscopy;  Laterality: N/A;  930  . EYE SURGERY Bilateral    partial cornea transplants   . GALLBLADDER SURGERY  12/1993  . UPPER GASTROINTESTINAL ENDOSCOPY      Current Outpatient Medications  Medication Sig Dispense Refill  . amLODipine (NORVASC) 5 MG tablet Take 5 mg by mouth daily.    Marland Kitchen atenolol (TENORMIN) 25 MG tablet TAKE (1) TABLET BY MOUTH ONCE DAILY. 90 tablet 3  . azelastine (ASTELIN) 0.1 % nasal spray Place 1 spray into both nostrils 2 (two) times daily.    . Azelastine-Fluticasone 137-50 MCG/ACT SUSP Place into the nose 2 (two) times daily.     . clobetasol (TEMOVATE) 0.05 % external solution Apply 1 application topically 2 (two) times daily. 50 mL 0  . dicyclomine (BENTYL) 10 MG capsule Take 1 capsule (10 mg total) by mouth daily before breakfast. 30 capsule 1  . diphenhydrAMINE (BENADRYL) 25 MG tablet Take 25 mg by mouth as needed for allergies.     . Doxepin HCl (ZONALON) 5 % CREA Apply 1 application topically daily. 45 g 2  . EPINEPHrine 0.3 mg/0.3 mL IJ SOAJ injection INJECT INTO THIGH ASONEEDED FOR ALLERGIC REACTION.    Marland Kitchen FLUoxetine (PROZAC) 10 MG tablet Take 10 mg by mouth daily.    . folic acid (FOLVITE) 1 MG tablet Take 2 tablets (2 mg total) by mouth daily. 180 tablet  3  . hydrOXYzine (ATARAX/VISTARIL) 25 MG tablet Take 25 mg by mouth every 8 (eight) hours as needed.    . Investigational - Study Medication Study name: atorvastatin 40mg  or placebo.    . levocetirizine (XYZAL) 5 MG tablet Take 5 mg by mouth every evening.     Marland Kitchen losartan (COZAAR) 50 MG tablet Take 100 mg by mouth every morning.    . methotrexate (RHEUMATREX) 2.5 MG tablet TAKE 8 TABLETS BY MOUTH ONCE WEEKLY. 96 tablet 0  . montelukast (SINGULAIR) 10 MG tablet Take 10 mg by mouth at bedtime.    . Omega-3 Fatty Acids (FISH OIL) 1000 MG  CAPS Take 1 capsule by mouth 2 (two) times daily.    . Testosterone (ANDROGEL TD) Place onto the skin daily.    . triazolam (HALCION) 0.25 MG tablet Take 0.25 mg by mouth at bedtime.      No current facility-administered medications for this visit.   Allergies:  Plaquenil [hydroxychloroquine sulfate] and Lodine [etodolac]   Social History: The patient  reports that he has never smoked. He has never used smokeless tobacco. He reports current alcohol use. He reports that he does not use drugs.   Family History: The patient's family history includes Asthma in his sister; Healthy in his sister and sister; Heart disease in his mother; Pancreatic cancer in his mother; Parkinson's disease in his brother; Prostate cancer in his father.   ROS:  Please see the history of present illness. Otherwise, complete review of systems is positive for none.  All other systems are reviewed and negative.   Physical Exam: VS:  BP 126/78   Pulse (!) 52   Ht 5\' 11"  (1.803 m)   Wt 197 lb 9.6 oz (89.6 kg)   SpO2 96%   BMI 27.56 kg/m , BMI Body mass index is 27.56 kg/m.  Wt Readings from Last 3 Encounters:  07/15/20 197 lb 9.6 oz (89.6 kg)  04/18/20 196 lb (88.9 kg)  03/22/20 197 lb 3.2 oz (89.4 kg)    General: Patient appears comfortable at rest. Neck: Supple, no elevated JVP or carotid bruits, no thyromegaly. Lungs: Clear to auscultation, nonlabored breathing at rest. Cardiac: Regular rate and rhythm, no S3 or significant systolic murmur, no pericardial rub. Extremities: No pitting edema, distal pulses 2+. Skin: Warm and dry. Musculoskeletal: No kyphosis. Neuropsychiatric: Alert and oriented x3, affect grossly appropriate.  ECG:  An ECG dated July 15, 2020 was personally reviewed today and demonstrated:  Marked sinus bradycardia with a rate of 48.  Recent Labwork: 03/22/2020: ALT 16; AST 17; BUN 20; Creat 0.96; Hemoglobin 12.4; Platelets 229; Potassium 4.2; Sodium 133  No results found for: CHOL,  TRIG, HDL, CHOLHDL, VLDL, LDLCALC, LDLDIRECT  Other Studies Reviewed Today:  12/2016 echo Study Conclusions  - Left ventricle: The cavity size was normal. Systolic function was normal. The estimated ejection fraction was in the range of 60% to 65%. There was an increased relative contribution of atrial contraction to ventricular filling. Mild focal basal septal hypertrophy. - Aorta: Very mild ascending aortic dilatation. Maximal diameter 3.8 cm. - Mitral valve: There was trivial regurgitation. - Tricuspid valve: There was mild regurgitation. - Pulmonary arteries: PA peak pressure: 36 mm Hg (S). - Systemic veins: IVC is dilated with normal respiratory variation. Estimated RAP 8 mmHg.  Assessment and Plan:  1. Palpitations   2. Other chest pain   3. Essential hypertension    1. Palpitations Denies any recent palpitations in the interim since last visit.  EKG today shows marked sinus bradycardia with a rate of 48.  He continues atenolol 25 mg p.o. daily.  2. Other chest pain Denies any recent anginal or exertional symptoms.  We will continue to monitor  3. Essential hypertension Blood pressure well controlled on current therapy.  Today's blood pressure 126/78.  Continue amlodipine 5 mg daily.  Continue atenolol 25 mg daily.  Patient states in the interim since last visit he has stopped his losartan.   Medication Adjustments/Labs and Tests Ordered: Current medicines are reviewed at length with the patient today.  Concerns regarding medicines are outlined above.   Disposition: Follow-up with Dr. Harl Bowie or [facility] 1 year  Signed, Levell July, NP 07/15/2020 9:36 AM    [facility] [place], [place], [place] [address] Phone: [phone]; Fax: [phone]

## 2020-07-15 ENCOUNTER — Encounter: Payer: Self-pay | Admitting: Family Medicine

## 2020-07-15 ENCOUNTER — Other Ambulatory Visit: Payer: Self-pay

## 2020-07-15 ENCOUNTER — Ambulatory Visit: Payer: Medicare HMO | Admitting: Family Medicine

## 2020-07-15 VITALS — BP 126/78 | HR 52 | Ht 71.0 in | Wt 197.6 lb

## 2020-07-15 DIAGNOSIS — I10 Essential (primary) hypertension: Secondary | ICD-10-CM | POA: Diagnosis not present

## 2020-07-15 DIAGNOSIS — R002 Palpitations: Secondary | ICD-10-CM | POA: Diagnosis not present

## 2020-07-15 DIAGNOSIS — R0789 Other chest pain: Secondary | ICD-10-CM

## 2020-07-15 NOTE — Patient Instructions (Signed)

## 2020-07-16 DIAGNOSIS — I10 Essential (primary) hypertension: Secondary | ICD-10-CM | POA: Diagnosis not present

## 2020-07-16 DIAGNOSIS — E7849 Other hyperlipidemia: Secondary | ICD-10-CM | POA: Diagnosis not present

## 2020-07-19 DIAGNOSIS — J3081 Allergic rhinitis due to animal (cat) (dog) hair and dander: Secondary | ICD-10-CM | POA: Diagnosis not present

## 2020-07-19 DIAGNOSIS — J3089 Other allergic rhinitis: Secondary | ICD-10-CM | POA: Diagnosis not present

## 2020-07-19 DIAGNOSIS — J301 Allergic rhinitis due to pollen: Secondary | ICD-10-CM | POA: Diagnosis not present

## 2020-07-27 DIAGNOSIS — J301 Allergic rhinitis due to pollen: Secondary | ICD-10-CM | POA: Diagnosis not present

## 2020-07-27 DIAGNOSIS — J3089 Other allergic rhinitis: Secondary | ICD-10-CM | POA: Diagnosis not present

## 2020-07-27 DIAGNOSIS — J3081 Allergic rhinitis due to animal (cat) (dog) hair and dander: Secondary | ICD-10-CM | POA: Diagnosis not present

## 2020-08-01 ENCOUNTER — Other Ambulatory Visit: Payer: Self-pay | Admitting: Physician Assistant

## 2020-08-01 DIAGNOSIS — J3089 Other allergic rhinitis: Secondary | ICD-10-CM | POA: Diagnosis not present

## 2020-08-01 DIAGNOSIS — J3081 Allergic rhinitis due to animal (cat) (dog) hair and dander: Secondary | ICD-10-CM | POA: Diagnosis not present

## 2020-08-01 DIAGNOSIS — M359 Systemic involvement of connective tissue, unspecified: Secondary | ICD-10-CM

## 2020-08-01 DIAGNOSIS — J301 Allergic rhinitis due to pollen: Secondary | ICD-10-CM | POA: Diagnosis not present

## 2020-08-01 NOTE — Telephone Encounter (Signed)
Last Visit: 03/22/2020 Next Visit: 08/25/2020 Labs: 03/22/2020 RBC count, hgb, and hct are low but improving. Rest of CBC WNL. Sodium is borderline low but improving. Rest of CMP WNL  Current Dose per office note 03/22/2020: MTX 8 tablets po once weekly DX: Autoimmune disease  Last Fill: 04/25/2020  Patient advised he due to update labs.   Okay to refill MTX?

## 2020-08-04 DIAGNOSIS — E663 Overweight: Secondary | ICD-10-CM | POA: Diagnosis not present

## 2020-08-04 DIAGNOSIS — E782 Mixed hyperlipidemia: Secondary | ICD-10-CM | POA: Diagnosis not present

## 2020-08-04 DIAGNOSIS — K589 Irritable bowel syndrome without diarrhea: Secondary | ICD-10-CM | POA: Diagnosis not present

## 2020-08-04 DIAGNOSIS — Z6827 Body mass index (BMI) 27.0-27.9, adult: Secondary | ICD-10-CM | POA: Diagnosis not present

## 2020-08-04 DIAGNOSIS — Z0001 Encounter for general adult medical examination with abnormal findings: Secondary | ICD-10-CM | POA: Diagnosis not present

## 2020-08-04 DIAGNOSIS — Z1389 Encounter for screening for other disorder: Secondary | ICD-10-CM | POA: Diagnosis not present

## 2020-08-04 DIAGNOSIS — I10 Essential (primary) hypertension: Secondary | ICD-10-CM | POA: Diagnosis not present

## 2020-08-04 DIAGNOSIS — M797 Fibromyalgia: Secondary | ICD-10-CM | POA: Diagnosis not present

## 2020-08-04 DIAGNOSIS — M359 Systemic involvement of connective tissue, unspecified: Secondary | ICD-10-CM | POA: Diagnosis not present

## 2020-08-04 DIAGNOSIS — E291 Testicular hypofunction: Secondary | ICD-10-CM | POA: Diagnosis not present

## 2020-08-04 DIAGNOSIS — J309 Allergic rhinitis, unspecified: Secondary | ICD-10-CM | POA: Diagnosis not present

## 2020-08-04 DIAGNOSIS — K219 Gastro-esophageal reflux disease without esophagitis: Secondary | ICD-10-CM | POA: Diagnosis not present

## 2020-08-08 DIAGNOSIS — J3089 Other allergic rhinitis: Secondary | ICD-10-CM | POA: Diagnosis not present

## 2020-08-08 DIAGNOSIS — J3081 Allergic rhinitis due to animal (cat) (dog) hair and dander: Secondary | ICD-10-CM | POA: Diagnosis not present

## 2020-08-08 DIAGNOSIS — J301 Allergic rhinitis due to pollen: Secondary | ICD-10-CM | POA: Diagnosis not present

## 2020-08-11 NOTE — Progress Notes (Signed)
Office Visit Note  Patient: Cole Brown             Date of Birth: [birth year]           MRN: [id number]             PCP: Redmond School, MD Referring: Redmond School, MD Visit Date: 08/25/2020 Occupation: _0 @  Subjective:  No chief complaint on file.   History of Present Illness: Cole Brown is a 75 y.o. male with a history of autoimmune disease and fibromyalgia syndrome.  He states he has not had joint swelling since he has been taking methotrexate.  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  He states he notices some fullness in his right hand at times.  Today he denies any joint swelling.  He states his fibromyalgia symptoms are fairly well controlled.  He continues to have some [name] symptoms which are manageable with amlodipine.  Activities of Daily Living:  Patient reports morning stiffness for 0 minutes.   Patient Reports nocturnal pain.  Difficulty dressing/grooming: Denies Difficulty climbing stairs: Denies Difficulty getting out of chair: Denies Difficulty using hands for taps, buttons, cutlery, and/or writing: Denies  Review of Systems  Constitutional: Negative for fatigue.  HENT: Negative for mouth sores, mouth dryness and nose dryness.   Eyes: Positive for itching and dryness. Negative for pain.       Allergy medication  Respiratory: Negative for shortness of breath and difficulty breathing.   Cardiovascular: Negative for chest pain and palpitations.  Gastrointestinal: Negative for blood in stool, constipation and diarrhea.  Endocrine: Negative for increased urination.  Genitourinary: Negative for difficulty urinating.  Musculoskeletal: Positive for myalgias and myalgias. Negative for arthralgias, joint pain, joint swelling, morning stiffness and muscle tenderness.  Skin: Negative for color change, rash and redness.  Allergic/Immunologic: Negative for susceptible to infections.  Neurological: Positive for parasthesias.  Negative for dizziness, numbness, headaches, memory loss and weakness.  Hematological: Negative for bruising/bleeding tendency.  Psychiatric/Behavioral: Negative for confusion.    PMFS History:  Patient Active Problem List   Diagnosis Date Noted  . 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 Lower abdominal pain 07/06/2019  . Autoimmune disease ([place]) 09/15/2018  . 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 Hypercholesteremia 08/21/2018  . History of diverticulitis 08/21/2018  . History of iron deficiency anemia 08/21/2018  . 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 Fibromyalgia 08/21/2018  . History of multiple allergies 08/21/2018  . History of sleep apnea 08/21/2018  . Rectal bleed 11/14/2016  . Essential hypertension 11/14/2016    Past Medical History:  Diagnosis Date  . Diverticula of colon    2016 colonoscopy  . Fibromyalgia   . Fibromyalgia   . History of GI diverticular bleed   . Hypercholesteremia   . Hypertension     Family History  Problem Relation Age of Onset  . Heart disease Mother   . Pancreatic cancer Mother   . Prostate cancer Father   . Healthy Sister   . Parkinson's disease Brother   . Asthma Sister   . Healthy Sister    Past Surgical History:  Procedure Laterality Date  . allergy shots  weekly  . CHOLECYSTECTOMY    . COLONOSCOPY     Dr.Rehman  . COLONOSCOPY N/A 01/27/2015   Procedure: COLONOSCOPY;  Surgeon: Rogene Houston, MD;  Location: [facility];  Service: Endoscopy;  Laterality: N/A;  930  . EYE SURGERY Bilateral    partial cornea  transplants   . GALLBLADDER SURGERY  12/1993  . UPPER GASTROINTESTINAL ENDOSCOPY     Social History   Social History Narrative  . Not on file   Immunization History  Administered Date(s) Administered  . Influenza-Unspecified 04/16/2020  . PFIZER(Purple Top)SARS-COV-2 Vaccination 08/05/2019, 08/26/2019  . Zoster Recombinat (Shingrix) 01/03/2018, 06/12/2018     Objective: Vital Signs: BP  131/79 (BP Location: Left Arm, Patient Position: Sitting, Cuff Size: Normal)   Pulse (!) 49   Resp 15   Ht 5' 11" (1.803 m)   Wt 198 lb 9.6 oz (90.1 kg)   BMI 27.70 kg/m    Physical Exam Vitals and nursing note reviewed.  Constitutional:      Appearance: He is well-developed.  HENT:     Head: Normocephalic and atraumatic.  Eyes:     Conjunctiva/sclera: Conjunctivae normal.     Pupils: Pupils are equal, round, and reactive to light.  Cardiovascular:     Rate and Rhythm: Normal rate and regular rhythm.     Heart sounds: Normal heart sounds.  Pulmonary:     Effort: Pulmonary effort is normal.     Breath sounds: Normal breath sounds.  Abdominal:     General: Bowel sounds are normal.     Palpations: Abdomen is soft.  Musculoskeletal:     Cervical back: Normal range of motion and neck supple.  Skin:    General: Skin is warm and dry.     Capillary Refill: Capillary refill takes less than 2 seconds.  Neurological:     Mental Status: He is alert and oriented to person, place, and time.  Psychiatric:        Behavior: Behavior normal.      Musculoskeletal Exam: C-spine was in good range of motion.  Shoulder joints, elbow joints, wrist joints, MCPs PIPs and DIPs with good range of motion with no synovitis.  Hip joints, knee joints, ankles, MTPs and PIPs with good range of motion with no synovitis.  CDAI Exam: CDAI Score: - Patient Global: -; Provider Global: - Swollen: -; Tender: - Joint Exam 08/25/2020   No joint exam has been documented for this visit   There is currently no information documented on the homunculus. Go to the Rheumatology activity and complete the homunculus joint exam.  Investigation: No additional findings.  Imaging: No results found.  Recent Labs: Lab Results  Component Value Date   WBC 6.7 08/23/2020   HGB 12.3 (L) 08/23/2020   PLT 234 08/23/2020   NA 134 08/23/2020   K 4.7 08/23/2020   CL 99 08/23/2020   CO2 20 08/23/2020   GLUCOSE 123 (H)  08/23/2020   BUN 18 08/23/2020   CREATININE 0.98 08/23/2020   BILITOT 0.5 08/23/2020   ALKPHOS 122 (H) 08/23/2020   AST 16 08/23/2020   ALT 16 08/23/2020   PROT 6.4 08/23/2020   ALBUMIN 3.9 08/23/2020   CALCIUM 9.0 08/23/2020   GFRAA 89 03/22/2020   QFTBGOLDPLUS NEGATIVE 08/21/2018    Speciality Comments: No specialty comments available.  Procedures:  No procedures performed Allergies: Plaquenil [hydroxychloroquine sulfate] and Lodine [etodolac]   Assessment / Plan:     Visit Diagnoses: Autoimmune disease (HCC) - ANA negative ENA negative, [name] phenomenon, beta-2 GP 1 IgM positive, anticardiolipin IgM positive, elevated ESR, severe inflammatory arthritis: Patient had no synovitis on my examination.  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  High risk medication use -   MTX 8 tablets po once weekly, folic acid 2 mg po daily.  His labs from December 2021 were stable except for mild anemia.  He has mild elevation of alkaline phosphatase.  We will continue to monitor that.  D/c PLQ-rash.  Increased risk of infection with methotrexate was discussed.  I advised him to stop methotrexate in case he develops an infection and then resume the medications once the infection resolves.    [name] disease without gangrene-his renal symptoms have improved on amlodipine.  He had anticardiolipin Ig M+ and beta-2 GP 1 IgM positive in high titers.  He has been taking aspirin 325 mg for prophylaxis.  Fibromyalgia-he continues to have some pain and discomfort.  History of diverticulitis  History of iron deficiency anemia  Essential hypertension-his blood pressure is controlled.  Fuchs' corneal dystrophy, unspecified laterality  History of multiple allergies  History of sleep apnea  Hypercholesteremia-increased risk of heart disease with autoimmune disease was discussed.  Dietary modifications and  exercise was discussed.  Educated but COVID-19 virus infection-He has had initial 2 doses of COVID-19 vaccination.  Getting a booster was discussed.  Also a fourth dose was discussed 6 months after the booster.  Use of mask, social distancing and hand hygiene was discussed.  Orders: No orders of the defined types were placed in this encounter.  No orders of the defined types were placed in this encounter.     Follow-Up Instructions: Return in about 5 months (around 01/25/2021) for Autoimmune disease.   Bo Merino, MD  Note - This record has been created using Editor, commissioning.  Chart creation errors have been sought, but may not always  have been located. Such creation errors do not reflect on  the standard of medical care.

## 2020-08-16 DIAGNOSIS — J301 Allergic rhinitis due to pollen: Secondary | ICD-10-CM | POA: Diagnosis not present

## 2020-08-16 DIAGNOSIS — J3089 Other allergic rhinitis: Secondary | ICD-10-CM | POA: Diagnosis not present

## 2020-08-16 DIAGNOSIS — J3081 Allergic rhinitis due to animal (cat) (dog) hair and dander: Secondary | ICD-10-CM | POA: Diagnosis not present

## 2020-08-22 DIAGNOSIS — G4733 Obstructive sleep apnea (adult) (pediatric): Secondary | ICD-10-CM | POA: Diagnosis not present

## 2020-08-23 ENCOUNTER — Other Ambulatory Visit: Payer: Self-pay | Admitting: *Deleted

## 2020-08-23 ENCOUNTER — Telehealth: Payer: Self-pay

## 2020-08-23 DIAGNOSIS — Z79899 Other long term (current) drug therapy: Secondary | ICD-10-CM | POA: Diagnosis not present

## 2020-08-23 DIAGNOSIS — R760 Raised antibody titer: Secondary | ICD-10-CM | POA: Diagnosis not present

## 2020-08-23 DIAGNOSIS — I7300 Raynaud's syndrome without gangrene: Secondary | ICD-10-CM

## 2020-08-23 DIAGNOSIS — M359 Systemic involvement of connective tissue, unspecified: Secondary | ICD-10-CM | POA: Diagnosis not present

## 2020-08-23 NOTE — Telephone Encounter (Signed)
Patient called requesting his labwork orders be sent to [facility] in [place].  Patient will be going this afternoon.

## 2020-08-23 NOTE — Telephone Encounter (Signed)
Labs ordered and released.

## 2020-08-25 ENCOUNTER — Encounter: Payer: Self-pay | Admitting: Rheumatology

## 2020-08-25 ENCOUNTER — Ambulatory Visit: Payer: Medicare HMO | Admitting: Rheumatology

## 2020-08-25 ENCOUNTER — Other Ambulatory Visit: Payer: Self-pay

## 2020-08-25 VITALS — BP 131/79 | HR 49 | Resp 15 | Ht 71.0 in | Wt 198.6 lb

## 2020-08-25 DIAGNOSIS — J3089 Other allergic rhinitis: Secondary | ICD-10-CM | POA: Diagnosis not present

## 2020-08-25 DIAGNOSIS — M359 Systemic involvement of connective tissue, unspecified: Secondary | ICD-10-CM

## 2020-08-25 DIAGNOSIS — M797 Fibromyalgia: Secondary | ICD-10-CM

## 2020-08-25 DIAGNOSIS — Z7189 Other specified counseling: Secondary | ICD-10-CM

## 2020-08-25 DIAGNOSIS — Z79899 Other long term (current) drug therapy: Secondary | ICD-10-CM | POA: Diagnosis not present

## 2020-08-25 DIAGNOSIS — H18519 Endothelial corneal dystrophy, unspecified eye: Secondary | ICD-10-CM | POA: Diagnosis not present

## 2020-08-25 DIAGNOSIS — Z9189 Other specified personal risk factors, not elsewhere classified: Secondary | ICD-10-CM | POA: Diagnosis not present

## 2020-08-25 DIAGNOSIS — I10 Essential (primary) hypertension: Secondary | ICD-10-CM | POA: Diagnosis not present

## 2020-08-25 DIAGNOSIS — Z8669 Personal history of other diseases of the nervous system and sense organs: Secondary | ICD-10-CM

## 2020-08-25 DIAGNOSIS — Z8719 Personal history of other diseases of the digestive system: Secondary | ICD-10-CM

## 2020-08-25 DIAGNOSIS — Z862 Personal history of diseases of the blood and blood-forming organs and certain disorders involving the immune mechanism: Secondary | ICD-10-CM | POA: Diagnosis not present

## 2020-08-25 DIAGNOSIS — I7300 Raynaud's syndrome without gangrene: Secondary | ICD-10-CM

## 2020-08-25 DIAGNOSIS — J3081 Allergic rhinitis due to animal (cat) (dog) hair and dander: Secondary | ICD-10-CM | POA: Diagnosis not present

## 2020-08-25 DIAGNOSIS — E7800 Pure hypercholesterolemia, unspecified: Secondary | ICD-10-CM

## 2020-08-25 DIAGNOSIS — Z889 Allergy status to unspecified drugs, medicaments and biological substances status: Secondary | ICD-10-CM

## 2020-08-25 DIAGNOSIS — J301 Allergic rhinitis due to pollen: Secondary | ICD-10-CM | POA: Diagnosis not present

## 2020-08-25 NOTE — Patient Instructions (Signed)
Standing Labs We placed an order today for your standing lab work.   Please have your standing labs drawn in June and every 3 months  If possible, please have your labs drawn 2 weeks prior to your appointment so that the provider can discuss your results at your appointment.  We have open lab daily Monday through Thursday from 1:30-4:30 PM and Friday from 1:30-4:00 PM at the office of Dr. Bo Merino, [facility].   Please be advised, all patients with office appointments requiring lab work will take precedents over walk-in lab work.  If possible, please come for your lab work on Monday and Friday afternoons, as you may experience shorter wait times. The office is located at [address] [place], [place], [place] [address] No appointment is necessary.   Labs are drawn by [facility]. Please bring your co-pay at the time of your lab draw.  You may receive a bill from [facility] for your lab work.  If you wish to have your labs drawn at another location, please call the office 24 hours in advance to send orders.  If you have any questions regarding directions or hours of operation,  please call [phone].   As a reminder, please drink plenty of water prior to coming for your lab work. Thanks!   COVID-19 vaccine recommendations:   COVID-19 vaccine is recommended for everyone (unless you are allergic to a vaccine component), even if you are on a medication that suppresses your immune system.   If you are on Methotrexate, Cellcept (mycophenolate), Rinvoq, Morrie Sheldon, and Olumiant- hold the medication for 1 week after each vaccine. Hold Methotrexate for 2 weeks after the single dose COVID-19 vaccine.   The recommendations are that the individuals on immunosuppressive therapy should receive first 3 COVID-19 vaccine doses 1 month apart and then a fourth dose (booster) 6 months after the third dose.  Do not take Tylenol or any anti-inflammatory medications (NSAIDs) 24 hours prior to  the COVID-19 vaccination.   There is no direct evidence about the efficacy of the COVID-19 vaccine in individuals who are on medications that suppress the immune system.   Even if you are fully vaccinated, and you are on any medications that suppress your immune system, please continue to wear a mask, maintain at least six feet social distance and practice hand hygiene.   If you develop a COVID-19 infection, please contact your PCP or our office to determine if you need monoclonal antibody infusion.  The booster vaccine is now available for immunocompromised patients.   Please see the following web sites for updated information.   [email or web address]

## 2020-08-26 LAB — CMP14+EGFR
ALT: 16 IU/L (ref 0–44)
AST: 16 IU/L (ref 0–40)
Albumin/Globulin Ratio: 1.6 (ref 1.2–2.2)
Albumin: 3.9 g/dL (ref 3.7–4.7)
Alkaline Phosphatase: 122 IU/L — ABNORMAL HIGH (ref 44–121)
BUN/Creatinine Ratio: 18 (ref 10–24)
BUN: 18 mg/dL (ref 8–27)
Bilirubin Total: 0.5 mg/dL (ref 0.0–1.2)
CO2: 20 mmol/L (ref 20–29)
Calcium: 9 mg/dL (ref 8.6–10.2)
Chloride: 99 mmol/L (ref 96–106)
Creatinine, Ser: 0.98 mg/dL (ref 0.76–1.27)
Globulin, Total: 2.5 g/dL (ref 1.5–4.5)
Glucose: 123 mg/dL — ABNORMAL HIGH (ref 65–99)
Potassium: 4.7 mmol/L (ref 3.5–5.2)
Sodium: 134 mmol/L (ref 134–144)
Total Protein: 6.4 g/dL (ref 6.0–8.5)
eGFR: 80 mL/min/{1.73_m2} (ref >59)

## 2020-08-26 LAB — CBC WITH DIFFERENTIAL/PLATELET
Basophils Absolute: 0.1 10*3/uL (ref 0.0–0.2)
Basos: 1 %
EOS (ABSOLUTE): 0.2 10*3/uL (ref 0.0–0.4)
Eos: 3 %
Hematocrit: 37.2 % — ABNORMAL LOW (ref 37.5–51.0)
Hemoglobin: 12.3 g/dL — ABNORMAL LOW (ref 13.0–17.7)
Immature Grans (Abs): 0 10*3/uL (ref 0.0–0.1)
Immature Granulocytes: 0 %
Lymphocytes Absolute: 1.4 10*3/uL (ref 0.7–3.1)
Lymphs: 21 %
MCH: 29.3 pg (ref 26.6–33.0)
MCHC: 33.1 g/dL (ref 31.5–35.7)
MCV: 89 fL (ref 79–97)
Monocytes Absolute: 0.6 10*3/uL (ref 0.1–0.9)
Monocytes: 9 %
Neutrophils Absolute: 4.5 10*3/uL (ref 1.4–7.0)
Neutrophils: 66 %
Platelets: 234 10*3/uL (ref 150–450)
RBC: 4.2 x10E6/uL (ref 4.14–5.80)
RDW: 14.9 % (ref 11.6–15.4)
WBC: 6.7 10*3/uL (ref 3.4–10.8)

## 2020-08-26 LAB — BETA-2-GLYCOPROTEIN I ABS, IGG/M/A
Beta-2 Glyco 1 IgA: 14 GPI IgA units (ref 0–25)
Beta-2 Glyco 1 IgM: >150 GPI IgM units — ABNORMAL HIGH (ref 0–32)
Beta-2 Glyco I IgG: 21 GPI IgG units — ABNORMAL HIGH (ref 0–20)

## 2020-08-26 LAB — CARDIOLIPIN ANTIBODIES, IGG, IGM, IGA
Anticardiolipin IgA: <9 APL U/mL (ref 0–11)
Anticardiolipin IgG: 9 GPL U/mL (ref 0–14)
Anticardiolipin IgM: 24 MPL U/mL — ABNORMAL HIGH (ref 0–12)

## 2020-08-26 NOTE — Progress Notes (Signed)
Labs for your review.

## 2020-08-26 NOTE — Progress Notes (Signed)
Anticardiolipin IgM antibody is improved.  Beta-2 GP 1 IgM is a still elevated.  His antibodies increase the risk of clotting,  recommend continue aspirin.  Glucose is mildly elevated probably not fasting.  Hemoglobin is low and stable.

## 2020-08-31 DIAGNOSIS — J301 Allergic rhinitis due to pollen: Secondary | ICD-10-CM | POA: Diagnosis not present

## 2020-08-31 DIAGNOSIS — J3081 Allergic rhinitis due to animal (cat) (dog) hair and dander: Secondary | ICD-10-CM | POA: Diagnosis not present

## 2020-08-31 DIAGNOSIS — J3089 Other allergic rhinitis: Secondary | ICD-10-CM | POA: Diagnosis not present

## 2020-09-07 DIAGNOSIS — J3089 Other allergic rhinitis: Secondary | ICD-10-CM | POA: Diagnosis not present

## 2020-09-07 DIAGNOSIS — J3081 Allergic rhinitis due to animal (cat) (dog) hair and dander: Secondary | ICD-10-CM | POA: Diagnosis not present

## 2020-09-07 DIAGNOSIS — J301 Allergic rhinitis due to pollen: Secondary | ICD-10-CM | POA: Diagnosis not present

## 2020-09-12 ENCOUNTER — Other Ambulatory Visit: Payer: Self-pay | Admitting: Physician Assistant

## 2020-09-12 DIAGNOSIS — M359 Systemic involvement of connective tissue, unspecified: Secondary | ICD-10-CM

## 2020-09-12 NOTE — Telephone Encounter (Signed)
Next Visit: message sent to front desk to schedule appt,   Return in about 5 months (around 01/25/2021) for Autoimmune disease.   Last Visit: 08/25/2020  Last Fill: 08/02/2020 # 33  DX: Autoimmune disease   Current Dose per office note 08/25/2020, MTX 8 tablets po once weekly  Labs: 08/23/2020, Anticardiolipin IgM antibody is improved. Beta-2 GP 1 IgM is a still elevated. His antibodies increase the risk of clotting, recommend continue aspirin. Glucose is mildly elevated probably not fasting. Hemoglobin is low and stable.  Okay to refill MTX?

## 2020-09-13 DIAGNOSIS — J3081 Allergic rhinitis due to animal (cat) (dog) hair and dander: Secondary | ICD-10-CM | POA: Diagnosis not present

## 2020-09-13 DIAGNOSIS — J3089 Other allergic rhinitis: Secondary | ICD-10-CM | POA: Diagnosis not present

## 2020-09-13 DIAGNOSIS — J301 Allergic rhinitis due to pollen: Secondary | ICD-10-CM | POA: Diagnosis not present

## 2020-09-14 DIAGNOSIS — E7849 Other hyperlipidemia: Secondary | ICD-10-CM | POA: Diagnosis not present

## 2020-09-14 DIAGNOSIS — I10 Essential (primary) hypertension: Secondary | ICD-10-CM | POA: Diagnosis not present

## 2020-09-16 DIAGNOSIS — H353131 Nonexudative age-related macular degeneration, bilateral, early dry stage: Secondary | ICD-10-CM | POA: Diagnosis not present

## 2020-09-20 DIAGNOSIS — J3081 Allergic rhinitis due to animal (cat) (dog) hair and dander: Secondary | ICD-10-CM | POA: Diagnosis not present

## 2020-09-20 DIAGNOSIS — J301 Allergic rhinitis due to pollen: Secondary | ICD-10-CM | POA: Diagnosis not present

## 2020-09-20 DIAGNOSIS — J3089 Other allergic rhinitis: Secondary | ICD-10-CM | POA: Diagnosis not present

## 2020-09-26 ENCOUNTER — Ambulatory Visit: Payer: Medicare HMO | Admitting: Dermatology

## 2020-09-26 ENCOUNTER — Other Ambulatory Visit: Payer: Self-pay

## 2020-09-26 ENCOUNTER — Encounter: Payer: Self-pay | Admitting: Dermatology

## 2020-09-26 DIAGNOSIS — L309 Dermatitis, unspecified: Secondary | ICD-10-CM

## 2020-09-26 DIAGNOSIS — L739 Follicular disorder, unspecified: Secondary | ICD-10-CM | POA: Diagnosis not present

## 2020-09-26 DIAGNOSIS — J3089 Other allergic rhinitis: Secondary | ICD-10-CM | POA: Diagnosis not present

## 2020-09-26 DIAGNOSIS — J3081 Allergic rhinitis due to animal (cat) (dog) hair and dander: Secondary | ICD-10-CM | POA: Diagnosis not present

## 2020-09-26 DIAGNOSIS — J301 Allergic rhinitis due to pollen: Secondary | ICD-10-CM | POA: Diagnosis not present

## 2020-09-26 NOTE — Patient Instructions (Signed)
Plan B-Over the counter clobetasol, apply to needed areas. Plan C-Over the counter benzoyl peroxide cream to needed areas.

## 2020-10-03 DIAGNOSIS — J301 Allergic rhinitis due to pollen: Secondary | ICD-10-CM | POA: Diagnosis not present

## 2020-10-03 DIAGNOSIS — J3089 Other allergic rhinitis: Secondary | ICD-10-CM | POA: Diagnosis not present

## 2020-10-03 DIAGNOSIS — J3081 Allergic rhinitis due to animal (cat) (dog) hair and dander: Secondary | ICD-10-CM | POA: Diagnosis not present

## 2020-10-06 ENCOUNTER — Encounter: Payer: Self-pay | Admitting: Dermatology

## 2020-10-06 NOTE — Progress Notes (Signed)
   Follow-Up Visit   Subjective  Cole Brown is a 75 y.o. male who presents for the following: Follow-up (Patient here to follow up on dtopic dermatitis. Per patient it comes and goes, clobetasol some what works. Flares up and gets better on scalp and ears. ).  Eczema Location: Recently mainly head and neck. Duration:  Quality:  Associated Signs/Symptoms: Modifying Factors:  Severity:  Timing: Context:   Objective  Well appearing patient in no apparent distress; mood and affect are within normal limits. Objective  Scalp: Relatively minor micropustules and crusts on scalp.  A consider using the clobetasol for several days on active lesions.  He may also try an over-the-counter benzoyl peroxide wash a couple of times a week on areas prone to get the spots to see if this will minimize new lesions .  Objective  Left [place]ix: Overall there has been some objective improvement in the visible eczematous dermatitis as well as historical improvement in the itching.  I discussed the newly available Adbry but I really do not think Mr. Marxen currently requires systemic biologic therapy.    All skin waist up examined.   Assessment & Plan    Folliculitis Scalp  Eczema, unspecified type Left Superior Helix  May continue to maintain skin hydration and use the topical cortisones as needed for flares.  Told to avoid using clobetasol on face and body folds and ideally to use it less than every day.  To contact me if his eczema significantly flares.      I, Lavonna Monarch, MD, have reviewed all documentation for this visit.  The documentation on 10/06/20 for the exam, diagnosis, procedures, and orders are all accurate and complete.

## 2020-10-10 DIAGNOSIS — J3089 Other allergic rhinitis: Secondary | ICD-10-CM | POA: Diagnosis not present

## 2020-10-10 DIAGNOSIS — J301 Allergic rhinitis due to pollen: Secondary | ICD-10-CM | POA: Diagnosis not present

## 2020-10-10 DIAGNOSIS — J3081 Allergic rhinitis due to animal (cat) (dog) hair and dander: Secondary | ICD-10-CM | POA: Diagnosis not present

## 2020-10-17 ENCOUNTER — Inpatient Hospital Stay (HOSPITAL_COMMUNITY): Payer: Medicare HMO | Attending: Hematology

## 2020-10-17 ENCOUNTER — Other Ambulatory Visit: Payer: Self-pay

## 2020-10-17 DIAGNOSIS — I7300 Raynaud's syndrome without gangrene: Secondary | ICD-10-CM | POA: Diagnosis not present

## 2020-10-17 DIAGNOSIS — R760 Raised antibody titer: Secondary | ICD-10-CM | POA: Diagnosis not present

## 2020-10-17 DIAGNOSIS — M1380 Other specified arthritis, unspecified site: Secondary | ICD-10-CM | POA: Insufficient documentation

## 2020-10-17 DIAGNOSIS — M2550 Pain in unspecified joint: Secondary | ICD-10-CM | POA: Diagnosis not present

## 2020-10-17 DIAGNOSIS — Z7982 Long term (current) use of aspirin: Secondary | ICD-10-CM | POA: Insufficient documentation

## 2020-10-17 DIAGNOSIS — Z79899 Other long term (current) drug therapy: Secondary | ICD-10-CM | POA: Insufficient documentation

## 2020-10-17 LAB — D-DIMER, QUANTITATIVE: D-Dimer, Quant: 0.53 ug/mL-FEU — ABNORMAL HIGH (ref 0.00–0.50)

## 2020-10-18 LAB — LUPUS ANTICOAGULANT PANEL
DRVVT: 51.8 s — ABNORMAL HIGH (ref 0.0–47.0)
PTT Lupus Anticoagulant: 46.2 s (ref 0.0–51.9)

## 2020-10-18 LAB — DRVVT MIX: dRVVT Mix: 38.6 s (ref 0.0–40.4)

## 2020-10-19 LAB — BETA-2-GLYCOPROTEIN I ABS, IGG/M/A
Beta-2 Glyco I IgG: 18 GPI IgG units (ref 0–20)
Beta-2-Glycoprotein I IgA: 14 GPI IgA units (ref 0–25)
Beta-2-Glycoprotein I IgM: >150 GPI IgM units — ABNORMAL HIGH (ref 0–32)

## 2020-10-19 LAB — CARDIOLIPIN ANTIBODIES, IGG, IGM, IGA
Anticardiolipin IgA: <9 APL U/mL (ref 0–11)
Anticardiolipin IgG: <9 GPL U/mL (ref 0–14)
Anticardiolipin IgM: 13 MPL U/mL — ABNORMAL HIGH (ref 0–12)

## 2020-10-24 ENCOUNTER — Inpatient Hospital Stay (HOSPITAL_BASED_OUTPATIENT_CLINIC_OR_DEPARTMENT_OTHER): Payer: Medicare HMO | Admitting: Hematology

## 2020-10-24 ENCOUNTER — Other Ambulatory Visit: Payer: Self-pay

## 2020-10-24 VITALS — BP 127/75 | HR 54 | Temp 97.6°F | Resp 16 | Wt 199.0 lb

## 2020-10-24 DIAGNOSIS — R760 Raised antibody titer: Secondary | ICD-10-CM

## 2020-10-24 DIAGNOSIS — M359 Systemic involvement of connective tissue, unspecified: Secondary | ICD-10-CM | POA: Diagnosis not present

## 2020-10-24 DIAGNOSIS — I7300 Raynaud's syndrome without gangrene: Secondary | ICD-10-CM | POA: Diagnosis not present

## 2020-10-24 DIAGNOSIS — M1380 Other specified arthritis, unspecified site: Secondary | ICD-10-CM | POA: Diagnosis not present

## 2020-10-24 DIAGNOSIS — M2550 Pain in unspecified joint: Secondary | ICD-10-CM | POA: Diagnosis not present

## 2020-10-24 DIAGNOSIS — Z7982 Long term (current) use of aspirin: Secondary | ICD-10-CM | POA: Diagnosis not present

## 2020-10-24 DIAGNOSIS — Z79899 Other long term (current) drug therapy: Secondary | ICD-10-CM | POA: Diagnosis not present

## 2020-10-24 NOTE — Progress Notes (Signed)
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 male, returns for routine follow-up for his autoimmune disease and anti-cardiolipin antibodies positivity. Cole Brown was last seen on 04/18/2020.   Today he reports feeling well. He is taking asiprin 325 mg. And tolerating it well. Denies recent infections, F/C, or night sweats. He reports redness in face when outdoors which he attributes to allergies.   He reports that his sister is also on blood thinners for unknown condition.  REVIEW OF SYSTEMS:  Review of Systems  Constitutional: Positive for fatigue (50%). Negative for appetite change, chills, diaphoresis and fever.  All other systems reviewed and are negative.   PAST MEDICAL/SURGICAL HISTORY:  Past Medical History:  Diagnosis Date  . Diverticula of colon    2016 colonoscopy  . Fibromyalgia   . Fibromyalgia   . History of GI diverticular bleed   . Hypercholesteremia   . Hypertension    Past Surgical History:  Procedure Laterality Date  . allergy shots  weekly  . CHOLECYSTECTOMY    . COLONOSCOPY     Dr.Rehman  . COLONOSCOPY N/A 01/27/2015   Procedure: COLONOSCOPY;  Surgeon: Rogene Houston, MD;  Location: [facility];  Service: Endoscopy;  Laterality: N/A;  930  . EYE SURGERY Bilateral    partial cornea transplants   . GALLBLADDER SURGERY  12/1993  . UPPER GASTROINTESTINAL ENDOSCOPY      SOCIAL HISTORY:  Social History   Socioeconomic History  . Marital status: Married    Spouse name: Not on file  . Number of children: Not on file  . Years of education: Not on file  . Highest education level: Not on  file  Occupational History  . Not on file  Tobacco Use  . Smoking status: Never Smoker  . Smokeless tobacco: Never Used  Vaping Use  . Vaping Use: Never used  Substance and Sexual Activity  . Alcohol use: Yes    Alcohol/week: 0.0 standard drinks    Comment: occas  . Drug use: No  . Sexual activity: Not on file  Other Topics Concern  . Not on file  Social History Narrative  . Not on file   Social Determinants of Health   Financial Resource Strain: Not on file  Food Insecurity: Not on file  Transportation Needs: Not on file  Physical Activity: Not on file  Stress: Not on file  Social Connections: Not on file  Intimate Partner Violence: Not on file    FAMILY HISTORY:  Family History  Problem Relation Age of Onset  . Heart disease Mother   . Pancreatic cancer Mother   . Prostate cancer Father   . Healthy Sister   . Parkinson's disease Brother   . Asthma Sister   . Healthy Sister     CURRENT MEDICATIONS:  Current Outpatient Medications  Medication Sig Dispense Refill  . amLODipine (NORVASC) 5 MG tablet Take 5 mg by mouth daily.    Marland Kitchen aspirin 325 MG EC tablet Take 325 mg by mouth daily.    Marland Kitchen atenolol (TENORMIN) 25 MG tablet TAKE (1) TABLET BY MOUTH ONCE DAILY. 90 tablet 3  . azelastine (ASTELIN) 0.1 % nasal spray  Place 1 spray into both nostrils 2 (two) times daily.    . Azelastine-Fluticasone 137-50 MCG/ACT SUSP Place into the nose 2 (two) times daily.     . Cholecalciferol (VITAMIN D) 50 MCG (2000 UT) CAPS Take 2,000 Units by mouth daily.    . clobetasol (TEMOVATE) 0.05 % external solution Apply 1 application topically 2 (two) times daily. 50 mL 0  . dicyclomine (BENTYL) 10 MG capsule Take 1 capsule (10 mg total) by mouth daily before breakfast. 30 capsule 1  . diphenhydrAMINE (BENADRYL) 25 MG tablet Take 25 mg by mouth as needed for allergies.     . Doxepin HCl (ZONALON) 5 % CREA Apply 1 application topically daily. 45 g 2  . EPINEPHrine 0.3 mg/0.3 mL IJ SOAJ  injection INJECT INTO THIGH ASONEEDED FOR ALLERGIC REACTION.    Marland Kitchen FLUoxetine (PROZAC) 10 MG tablet Take 10 mg by mouth daily.    . fluticasone (FLONASE) 50 MCG/ACT nasal spray SMARTSIG:1-2 Spray(s) Both Nares Daily    . folic acid (FOLVITE) 1 MG tablet Take 2 tablets (2 mg total) by mouth daily. 180 tablet 3  . hydrOXYzine (ATARAX/VISTARIL) 25 MG tablet Take 25 mg by mouth every 8 (eight) hours as needed.    . Investigational - Study Medication Study name: atorvastatin 40mg  or placebo.    . levocetirizine (XYZAL) 5 MG tablet Take 5 mg by mouth every evening.     . methotrexate 2.5 MG tablet TAKE 8 TABLETS BY MOUTH ONCE WEEKLY. 96 tablet 0  . montelukast (SINGULAIR) 10 MG tablet Take 10 mg by mouth at bedtime.    . Omega-3 Fatty Acids (FISH OIL) 1000 MG CAPS Take 1 capsule by mouth 2 (two) times daily.    . Testosterone (ANDROGEL TD) Place onto the skin daily.    . triazolam (HALCION) 0.25 MG tablet Take 0.25 mg by mouth at bedtime.      No current facility-administered medications for this visit.    ALLERGIES:  Allergies  Allergen Reactions  . Plaquenil [Hydroxychloroquine Sulfate]     rash  . Lodine [Etodolac] Swelling and Rash    PHYSICAL EXAM:  Performance status (ECOG): 1 - Symptomatic but completely ambulatory  Vitals:   10/24/20 1435  BP: 127/75  Pulse: (!) 54  Resp: 16  Temp: 97.6 F (36.4 C)  SpO2: 100%   Wt Readings from Last 3 Encounters:  10/24/20 199 lb (90.3 kg)  08/25/20 198 lb 9.6 oz (90.1 kg)  07/15/20 197 lb 9.6 oz (89.6 kg)   Physical Exam Vitals reviewed.  Constitutional:      Appearance: Normal appearance.  Cardiovascular:     Rate and Rhythm: Normal rate and regular rhythm.     Pulses: Normal pulses.     Heart sounds: Normal heart sounds.  Pulmonary:     Effort: Pulmonary effort is normal.     Breath sounds: Normal breath sounds.  Abdominal:     Palpations: Abdomen is soft. There is no hepatomegaly, splenomegaly or mass.     Tenderness: There  is no abdominal tenderness.  Neurological:     General: No focal deficit present.     Mental Status: He is alert and oriented to person, place, and time.  Psychiatric:        Mood and Affect: Mood normal.        Behavior: Behavior normal.     LABORATORY DATA:  I have reviewed the labs as listed.  CBC Latest Ref Rng & Units 08/23/2020 03/22/2020 12/18/2019  WBC  3.4 - 10.8 x10E3/uL 6.7 5.9 9.4  Hemoglobin 13.0 - 17.7 g/dL 12.3(L) 12.4(L) 12.1(L)  Hematocrit 37.5 - 51.0 % 37.2(L) 37.2(L) 34.9(L)  Platelets 150 - 450 x10E3/uL 234 229 242   CMP Latest Ref Rng & Units 08/23/2020 03/22/2020 12/18/2019  Glucose 65 - 99 mg/dL 123(H) 103(H) 92  BUN 8 - 27 mg/dL 18 20 13   Creatinine 0.76 - 1.27 mg/dL 0.98 0.96 0.95  Sodium 134 - 144 mmol/L 134 133(L) 129(L)  Potassium 3.5 - 5.2 mmol/L 4.7 4.2 4.3  Chloride 96 - 106 mmol/L 99 100 98  CO2 20 - 29 mmol/L 20 25 25   Calcium 8.6 - 10.2 mg/dL 9.0 9.1 9.1  Total Protein 6.0 - 8.5 g/dL 6.4 6.5 6.5  Total Bilirubin 0.0 - 1.2 mg/dL 0.5 0.7 0.5  Alkaline Phos 44 - 121 IU/L 122(H) - -  AST 0 - 40 IU/L 16 17 17   ALT 0 - 44 IU/L 16 16 15       Component Value Date/Time   RBC 4.20 08/23/2020 1415   RBC 4.08 (L) 03/22/2020 0946   MCV 89 08/23/2020 1415   MCH 29.3 08/23/2020 1415   MCH 30.4 03/22/2020 0946   MCHC 33.1 08/23/2020 1415   MCHC 33.3 03/22/2020 0946   RDW 14.9 08/23/2020 1415   LYMPHSABS 1.4 08/23/2020 1415   MONOABS 980 (H) 11/28/2016 0906   EOSABS 0.2 08/23/2020 1415   BASOSABS 0.1 08/23/2020 1415    DIAGNOSTIC IMAGING:  I have independently reviewed the scans and discussed with the patient. No results found.   ASSESSMENT:  1.  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  He has intermittent [name] phenomena. -Currently being treated with methotrexate 8 tablets/week under the direction of Dr. Estanislado Pandy.  Methotrexate  has helped with joint pains. -Patient had positive antibeta-2 glycoprotein 1 IgM antibody (greater than 112) twice in March and July of this year.  He also has anticardiolipin antibody IgM positive (greater than 112) on 2 different occasions at least 3 months apart.  Lupus anticoagulant was negative twice. -He does not have any history of arterial or venous thrombosis.  No history of strokes. -No evidence of renal involvement or lung involvement.  Echocardiogram in 2018 did not show any major valvular involvement. -He has mild erythematous maculopapular rash about the medial ankles.  2.  Social/family history: -He used to work in [employer] prior to retirement.  Lives at home with his wife.  Non-smoker. -No family history of lupus anticoagulant or miscarriages or DVTs.  Father had prostate cancer and mother had pancreatic cancer.  Several paternal aunts had cancer, unknown type to the patient.   PLAN:  1.  Positive antiphospholipid antibody (aPL) without clinical criteria for antiphospholipid syndrome: -We reviewed labs from 10/17/2020.  Lupus anticoagulant was negative. - Anti-Beta  2 glycoprotein 1 IgM antibody was more than 150 on many occasions. - Anticardiolipin IgM antibody has improved to 13. - Patients with positive antiphospholipid antibody and other connective tissue disorders or increased risk for thrombosis based on several studies.  I have educated him about high risk of thrombosis and seek immediate medical attention for any leg swelling, pain in the legs, pleuritic chest pain or shortness of breath. - Continue aspirin 325 mg daily for primary prophylaxis. - His sister is reportedly on blood thinners, but he does not know the reason. -I will see him back in 6 months for follow-up.   Orders placed this  encounter:  No orders of the defined types were placed in this encounter.    Cole Jack, MD [place] [phone]   I, Milinda Antis, am acting  as a scribe for Dr. Sanda Linger.  I, Cole Jack MD, have reviewed the above documentation for accuracy and completeness, and I agree with the above.

## 2020-10-24 NOTE — Patient Instructions (Signed)
[place] at [facility] Discharge Instructions  You were seen today by Dr. Delton Coombes. He went over your recent results. Your diagnosis is positive anti-phospholipid antibodies. Dr. Delton Coombes will see you back in 6 months for labs and follow up.   Thank you for choosing [place] at [facility] to provide your oncology and hematology care.  To afford each patient quality time with our provider, please arrive at least 15 minutes before your scheduled appointment time.   If you have a lab appointment with the [place] please come in thru the Main Entrance and check in at the main information desk  You need to re-schedule your appointment should you arrive 10 or more minutes late.  We strive to give you quality time with our providers, and arriving late affects you and other patients whose appointments are after yours.  Also, if you no show three or more times for appointments you may be dismissed from the clinic at the providers discretion.     Again, thank you for choosing [facility].  Our hope is that these requests will decrease the amount of time that you wait before being seen by our physicians.       _____________________________________________________________  Should you have questions after your visit to [facility], please contact our office at [phone] between the hours of 8:00 a.m. and 4:30 p.m.  Voicemails left after 4:00 p.m. will not be returned until the following business day.  For prescription refill requests, have your pharmacy contact our office and allow 72 hours.    Cancer Center Support Programs:   > Cancer Support Group  2nd Tuesday of the month 1pm-2pm, Journey Room

## 2020-10-27 DIAGNOSIS — G4733 Obstructive sleep apnea (adult) (pediatric): Secondary | ICD-10-CM | POA: Diagnosis not present

## 2020-11-01 DIAGNOSIS — J3089 Other allergic rhinitis: Secondary | ICD-10-CM | POA: Diagnosis not present

## 2020-11-01 DIAGNOSIS — J3081 Allergic rhinitis due to animal (cat) (dog) hair and dander: Secondary | ICD-10-CM | POA: Diagnosis not present

## 2020-11-01 DIAGNOSIS — J301 Allergic rhinitis due to pollen: Secondary | ICD-10-CM | POA: Diagnosis not present

## 2020-11-07 DIAGNOSIS — J3089 Other allergic rhinitis: Secondary | ICD-10-CM | POA: Diagnosis not present

## 2020-11-07 DIAGNOSIS — J301 Allergic rhinitis due to pollen: Secondary | ICD-10-CM | POA: Diagnosis not present

## 2020-11-07 DIAGNOSIS — J3081 Allergic rhinitis due to animal (cat) (dog) hair and dander: Secondary | ICD-10-CM | POA: Diagnosis not present

## 2020-11-15 DIAGNOSIS — E7849 Other hyperlipidemia: Secondary | ICD-10-CM | POA: Diagnosis not present

## 2020-11-15 DIAGNOSIS — J3089 Other allergic rhinitis: Secondary | ICD-10-CM | POA: Diagnosis not present

## 2020-11-15 DIAGNOSIS — J3081 Allergic rhinitis due to animal (cat) (dog) hair and dander: Secondary | ICD-10-CM | POA: Diagnosis not present

## 2020-11-15 DIAGNOSIS — I10 Essential (primary) hypertension: Secondary | ICD-10-CM | POA: Diagnosis not present

## 2020-11-15 DIAGNOSIS — J301 Allergic rhinitis due to pollen: Secondary | ICD-10-CM | POA: Diagnosis not present

## 2020-11-26 DIAGNOSIS — J3081 Allergic rhinitis due to animal (cat) (dog) hair and dander: Secondary | ICD-10-CM | POA: Diagnosis not present

## 2020-11-29 DIAGNOSIS — J3089 Other allergic rhinitis: Secondary | ICD-10-CM | POA: Diagnosis not present

## 2020-11-29 DIAGNOSIS — J301 Allergic rhinitis due to pollen: Secondary | ICD-10-CM | POA: Diagnosis not present

## 2020-11-29 DIAGNOSIS — J3081 Allergic rhinitis due to animal (cat) (dog) hair and dander: Secondary | ICD-10-CM | POA: Diagnosis not present

## 2020-11-30 DIAGNOSIS — Z961 Presence of intraocular lens: Secondary | ICD-10-CM | POA: Diagnosis not present

## 2020-11-30 DIAGNOSIS — H26493 Other secondary cataract, bilateral: Secondary | ICD-10-CM | POA: Diagnosis not present

## 2020-11-30 DIAGNOSIS — Z9889 Other specified postprocedural states: Secondary | ICD-10-CM | POA: Diagnosis not present

## 2020-11-30 DIAGNOSIS — Z947 Corneal transplant status: Secondary | ICD-10-CM | POA: Diagnosis not present

## 2020-12-05 DIAGNOSIS — J301 Allergic rhinitis due to pollen: Secondary | ICD-10-CM | POA: Diagnosis not present

## 2020-12-05 DIAGNOSIS — J3081 Allergic rhinitis due to animal (cat) (dog) hair and dander: Secondary | ICD-10-CM | POA: Diagnosis not present

## 2020-12-05 DIAGNOSIS — J3089 Other allergic rhinitis: Secondary | ICD-10-CM | POA: Diagnosis not present

## 2020-12-12 DIAGNOSIS — J3089 Other allergic rhinitis: Secondary | ICD-10-CM | POA: Diagnosis not present

## 2020-12-12 DIAGNOSIS — J3081 Allergic rhinitis due to animal (cat) (dog) hair and dander: Secondary | ICD-10-CM | POA: Diagnosis not present

## 2020-12-12 DIAGNOSIS — J301 Allergic rhinitis due to pollen: Secondary | ICD-10-CM | POA: Diagnosis not present

## 2020-12-15 DIAGNOSIS — I10 Essential (primary) hypertension: Secondary | ICD-10-CM | POA: Diagnosis not present

## 2020-12-15 DIAGNOSIS — E7849 Other hyperlipidemia: Secondary | ICD-10-CM | POA: Diagnosis not present

## 2020-12-20 DIAGNOSIS — J3089 Other allergic rhinitis: Secondary | ICD-10-CM | POA: Diagnosis not present

## 2020-12-20 DIAGNOSIS — J3081 Allergic rhinitis due to animal (cat) (dog) hair and dander: Secondary | ICD-10-CM | POA: Diagnosis not present

## 2020-12-20 DIAGNOSIS — J301 Allergic rhinitis due to pollen: Secondary | ICD-10-CM | POA: Diagnosis not present

## 2020-12-21 DIAGNOSIS — J3089 Other allergic rhinitis: Secondary | ICD-10-CM | POA: Diagnosis not present

## 2020-12-21 DIAGNOSIS — J3081 Allergic rhinitis due to animal (cat) (dog) hair and dander: Secondary | ICD-10-CM | POA: Diagnosis not present

## 2020-12-26 ENCOUNTER — Other Ambulatory Visit: Payer: Self-pay | Admitting: Physician Assistant

## 2020-12-26 DIAGNOSIS — M359 Systemic involvement of connective tissue, unspecified: Secondary | ICD-10-CM

## 2020-12-26 NOTE — Telephone Encounter (Signed)
Next Visit: Return in about 5 months (around 01/25/2021) for Autoimmune disease.    Last Visit: 08/25/2020   Last Fill: 09/12/2020   DX: Autoimmune disease    Current Dose per office note 08/25/2020, MTX 8 tablets po once weekly   Labs: 08/23/2020, Anticardiolipin IgM antibody is improved.  Beta-2 GP 1 IgM is a still elevated.  His antibodies increase the risk of clotting,  recommend continue aspirin.  Glucose is mildly elevated probably not fasting.  Hemoglobin is low and stable.  Left message to advise patient he is due to update labs and to schedule a follow up visit.   OKay to refill MTX?

## 2020-12-27 DIAGNOSIS — J3081 Allergic rhinitis due to animal (cat) (dog) hair and dander: Secondary | ICD-10-CM | POA: Diagnosis not present

## 2020-12-27 DIAGNOSIS — J3089 Other allergic rhinitis: Secondary | ICD-10-CM | POA: Diagnosis not present

## 2020-12-27 DIAGNOSIS — J301 Allergic rhinitis due to pollen: Secondary | ICD-10-CM | POA: Diagnosis not present

## 2021-01-02 DIAGNOSIS — H43813 Vitreous degeneration, bilateral: Secondary | ICD-10-CM | POA: Diagnosis not present

## 2021-01-02 DIAGNOSIS — H01002 Unspecified blepharitis right lower eyelid: Secondary | ICD-10-CM | POA: Diagnosis not present

## 2021-01-02 DIAGNOSIS — H01001 Unspecified blepharitis right upper eyelid: Secondary | ICD-10-CM | POA: Diagnosis not present

## 2021-01-02 DIAGNOSIS — H353122 Nonexudative age-related macular degeneration, left eye, intermediate dry stage: Secondary | ICD-10-CM | POA: Diagnosis not present

## 2021-01-02 DIAGNOSIS — H353211 Exudative age-related macular degeneration, right eye, with active choroidal neovascularization: Secondary | ICD-10-CM | POA: Diagnosis not present

## 2021-01-02 DIAGNOSIS — H26493 Other secondary cataract, bilateral: Secondary | ICD-10-CM | POA: Diagnosis not present

## 2021-01-03 DIAGNOSIS — J3081 Allergic rhinitis due to animal (cat) (dog) hair and dander: Secondary | ICD-10-CM | POA: Diagnosis not present

## 2021-01-03 DIAGNOSIS — J301 Allergic rhinitis due to pollen: Secondary | ICD-10-CM | POA: Diagnosis not present

## 2021-01-03 DIAGNOSIS — L299 Pruritus, unspecified: Secondary | ICD-10-CM | POA: Diagnosis not present

## 2021-01-03 DIAGNOSIS — J3089 Other allergic rhinitis: Secondary | ICD-10-CM | POA: Diagnosis not present

## 2021-01-11 DIAGNOSIS — J301 Allergic rhinitis due to pollen: Secondary | ICD-10-CM | POA: Diagnosis not present

## 2021-01-11 DIAGNOSIS — J3081 Allergic rhinitis due to animal (cat) (dog) hair and dander: Secondary | ICD-10-CM | POA: Diagnosis not present

## 2021-01-11 DIAGNOSIS — J3089 Other allergic rhinitis: Secondary | ICD-10-CM | POA: Diagnosis not present

## 2021-01-12 DIAGNOSIS — G4733 Obstructive sleep apnea (adult) (pediatric): Secondary | ICD-10-CM | POA: Diagnosis not present

## 2021-01-12 NOTE — Progress Notes (Signed)
Office Visit Note  Patient: Cole Brown             Date of Birth: 01-17-1946           MRN: [id number]             PCP: Redmond School, MD Referring: Redmond School, MD Visit Date: 01/25/2021 Occupation: @GUAROCC @  Subjective:  Medication monitoring   History of Present Illness: Cole Brown is a 75 y.o. male with history of autoimmune disease and fibromyalgia.  Patient is taking methotrexate 8 tablets by mouth once weekly and folic acid 2 mg by mouth daily.  He continues to tolerate methotrexate without any side effects and has not missed any doses recently.  Patient reports that she continues to take aspirin 325 mg daily.  He denies any history of blood clots.  He continues to follow-up with a hematologist on a regular basis.  He has not noticed any excessive bruising while taking aspirin. Patient reports that he has occasional pain in his right shoulder and right hip.  He also has occasional myalgias and muscle tenderness due to fibromyalgia.  At nights he has difficulty falling asleep but has been taking triazolam at bedtime.   He denies any joint swelling.  He denies any recent rashes or photosensitivity.  He has not had any symptoms of [name] recently.  He denies any oral ulcerations or nasal ulcerations.  He continues to have persistent dry mouth.  He denies any swollen lymph nodes.  He has not had any shortness of breath, chest pain, or palpitations.  He denies any other new concerns at this time. He denies any recent infections.  Activities of Daily Living:  Patient reports morning stiffness for 0 minutes.   Patient Reports nocturnal pain.  Difficulty dressing/grooming: Denies Difficulty climbing stairs: Denies Difficulty getting out of chair: Denies Difficulty using hands for taps, buttons, cutlery, and/or writing: Denies  Review of Systems  Constitutional:  Negative for fatigue.  HENT:  Positive for mouth dryness. Negative for mouth sores and nose dryness.   Eyes:   Negative for pain, itching and dryness.  Respiratory:  Negative for shortness of breath and difficulty breathing.   Cardiovascular:  Negative for chest pain and palpitations.  Gastrointestinal:  Negative for blood in stool, constipation and diarrhea.  Endocrine: Negative for increased urination.  Genitourinary:  Negative for difficulty urinating.  Musculoskeletal:  Positive for joint pain and joint pain. Negative for joint swelling, myalgias, morning stiffness, muscle tenderness and myalgias.  Skin:  Negative for color change, rash and redness.  Allergic/Immunologic: Negative for susceptible to infections.  Neurological:  Negative for dizziness, numbness, headaches, memory loss and weakness.  Hematological:  Positive for bruising/bleeding tendency.  Psychiatric/Behavioral:  Negative for confusion.    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   Fibromyalgia 08/21/2018   History of multiple allergies 08/21/2018   History of sleep apnea 08/21/2018   Rectal bleed 11/14/2016   Essential hypertension 11/14/2016    Past Medical History:  Diagnosis Date   Diverticula of colon    2016 colonoscopy   Fibromyalgia    Fibromyalgia    History of GI diverticular bleed  Hypercholesteremia    Hypertension     Family History  Problem Relation Age of Onset   Heart disease Mother    Pancreatic cancer Mother    Prostate cancer Father    Healthy Sister    Parkinson's disease Brother    Asthma Sister    Healthy Sister    Past Surgical History:  Procedure Laterality Date   allergy shots  weekly   CHOLECYSTECTOMY     COLONOSCOPY     Dr.Rehman    COLONOSCOPY N/A 01/27/2015   Procedure: COLONOSCOPY;  Surgeon: Rogene Houston, MD;  Location: [facility];  Service: Endoscopy;  Laterality: N/A;  930   EYE SURGERY Bilateral    partial cornea transplants    GALLBLADDER SURGERY  12/1993   UPPER GASTROINTESTINAL ENDOSCOPY     Social History   Social History Narrative   Not on file   Immunization History  Administered Date(s) Administered   Influenza-Unspecified 04/16/2020   PFIZER(Purple Top)SARS-COV-2 Vaccination 08/05/2019, 08/26/2019   Zoster Recombinat (Shingrix) 01/03/2018, 06/12/2018     Objective: Vital Signs: BP 133/81 (BP Location: Left Arm, Patient Position: Sitting, Cuff Size: Normal)   Pulse (!) 50   Resp 15   Ht 5' 11"  (1.803 m)   Wt 204 lb 12.8 oz (92.9 kg)   BMI 28.56 kg/m    Physical Exam Vitals and nursing note reviewed.  Constitutional:      Appearance: He is well-developed.  HENT:     Head: Normocephalic and atraumatic.  Eyes:     Conjunctiva/sclera: Conjunctivae normal.     Pupils: Pupils are equal, round, and reactive to light.  Pulmonary:     Effort: Pulmonary effort is normal.  Abdominal:     Palpations: Abdomen is soft.  Musculoskeletal:     Cervical back: Normal range of motion and neck supple.  Skin:    General: Skin is warm and dry.     Capillary Refill: Capillary refill takes less than 2 seconds.  Neurological:     Mental Status: He is alert and oriented to person, place, and time.  Psychiatric:        Behavior: Behavior normal.     Musculoskeletal Exam: C-spine, thoracic spine, and lumbar spine good ROM. No midline spinal tenderness or SI joint tenderness.  Shoulder joints, elbow joints, wrist joints, MCPs, PIPs, and DIPs have good ROM with no discomfort.  Complete fist formation bilaterally.  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  Hip joints, knee joints, and ankle joints good ROM with no discomfort.  No tenderness over trochanteric bursa.  Small effusions in both  knees.  No tenderness or joint swelling of ankles.   CDAI Exam: CDAI Score: -- Patient Global: --; Provider Global: -- Swollen: --; Tender: -- Joint Exam 01/25/2021   No joint exam has been documented for this visit   There is currently no information documented on the homunculus. Go to the Rheumatology activity and complete the homunculus joint exam.  Investigation: No additional findings.  Imaging: No results found.  Recent Labs: Lab Results  Component Value Date   WBC 6.7 08/23/2020   HGB 12.3 (L) 08/23/2020   PLT 234 08/23/2020   NA 134 08/23/2020   K 4.7 08/23/2020   CL 99 08/23/2020   CO2 20 08/23/2020   GLUCOSE 123 (H) 08/23/2020   BUN 18 08/23/2020   CREATININE 0.98 08/23/2020   BILITOT 0.5 08/23/2020   ALKPHOS 122 (H) 08/23/2020   AST 16 08/23/2020  ALT 16 08/23/2020   PROT 6.4 08/23/2020   ALBUMIN 3.9 08/23/2020   CALCIUM 9.0 08/23/2020   GFRAA 89 03/22/2020   QFTBGOLDPLUS NEGATIVE 08/21/2018    Speciality Comments: No specialty comments available.  Procedures:  No procedures performed Allergies: Plaquenil [hydroxychloroquine sulfate] and Lodine [etodolac]    Assessment / Plan:     Visit Diagnoses: Autoimmune disease ([place]) - ANA+ in 2018, ENA negative, [name] phenomenon, beta-2 GP 1 IgM positive, anticardiolipin IgM positive, elevated ESR, severe inflammatory arthritis: He has not had any signs or symptoms of a flare recently.  He has clinically been doing well taking methotrexate 8 tablets by mouth once weekly and folic acid 2 mg by mouth daily.  He experiences occasional discomfort in his right shoulder and right hip but has not been experiencing any nocturnal pain.  No synovitis was noted on examination today.  He has not had any recent rashes or photosensitivity.  He has not had any symptoms of [name] recently.  No digital ulcerations or signs of gangrene were noted.  He has not had any oral or nasal ulcerations.  No sicca symptoms.  He has not  had any recent fevers or cervical lymphadenopathy.  His energy level has been stable overall.  He has not had any shortness of breath, pleuritic chest pain, or palpitations.   He continues to follow-up every 6 months with his hematologist, Dr. Debe Coder, and he has frequent lab monitoring. Most recent lab work on 10/17/20 was reviewed today in the office: anticardiolipin IgM 13 (improved), lupus anticoagulant not detected, and Beta-2 glycoprotein I IgM >150. He has no history of arterial or venous thrombosis.  Echocardiogram in 2018 did not show any major valvular involvement. He remains on aspirin 325 mg daily as primary prophylaxis. He has not developed any new or worsening symptoms. ANA will be rechecked today (positive in 2018, ENA negative).   He will remain on MTX and folic acid as prescribed.  Advised to notify us if he has symptoms of a flare.  He will follow up in 5 months.- Plan: ANA  High risk medication use -Methotrexate 8 tablets by mouth once weekly, folic acid 2 mg by mouth daily - Plan: CBC with Differential/Platelet, COMPLETE METABOLIC PANEL WITH GFR CBC and CMP drawn on 08/23/2020.  He is due to update lab work today.  Orders for CBC and CMP were released.  His next lab work will be due in November and every 3 months to monitor for drug toxicity.  Standing orders for CBC and CMP remain in place. He has not had any recent infections.  Discussed the importance of holding methotrexate if he develops signs or symptoms of an infection and to resume once the infection has completely cleared.  [name] disease without gangrene - He had anticardiolipin Ig M+ and beta-2 GP 1 IgM positive in high titers.  He has been taking aspirin 325 mg for prophylaxis.  Fibromyalgia: He experiences intermittent myalgias and muscle tenderness due to fibromyalgia.  Overall his symptoms have been tolerable.  He has difficulty falling asleep at night and has been taking triazolam at bedtime.  Discussed the importance  of regular exercise and good sleep hygiene.   Other medical conditions are listed as follows:   History of diverticulitis  History of iron deficiency anemia: Followed by hematology.  Fuchs' corneal dystrophy, unspecified laterality  Essential hypertension: Blood pressure is 133/81 today in the office.  History of sleep apnea  Hypercholesteremia  History of multiple allergies  Orders: Orders  Placed This Encounter  Procedures   CBC with Differential/Platelet   COMPLETE METABOLIC PANEL WITH GFR   ANA   No orders of the defined types were placed in this encounter.    Follow-Up Instructions: Return in about 5 months (around 06/27/2021) for Autoimmune Disease, Fibromyalgia.   Ofilia Neas, PA-C  Note - This record has been created using Dragon software.  Chart creation errors have been sought, but may not always  have been located. Such creation errors do not reflect on  the standard of medical care.

## 2021-01-13 DIAGNOSIS — H353211 Exudative age-related macular degeneration, right eye, with active choroidal neovascularization: Secondary | ICD-10-CM | POA: Diagnosis not present

## 2021-01-17 DIAGNOSIS — J3089 Other allergic rhinitis: Secondary | ICD-10-CM | POA: Diagnosis not present

## 2021-01-17 DIAGNOSIS — J301 Allergic rhinitis due to pollen: Secondary | ICD-10-CM | POA: Diagnosis not present

## 2021-01-17 DIAGNOSIS — J3081 Allergic rhinitis due to animal (cat) (dog) hair and dander: Secondary | ICD-10-CM | POA: Diagnosis not present

## 2021-01-20 NOTE — Progress Notes (Signed)
   Follow-Up Visit   Subjective  Cole Brown is a 75 y.o. male who presents for the following: Annual Exam (Scalp raised crust, left ear tender, groin rash & feet red).  General skin examination with several issues relating to rash and itch to focus on. Location:  Duration:  Quality:  Associated Signs/Symptoms: Modifying Factors:  Severity:  Timing: Context:   Objective  Well appearing patient in no apparent distress; mood and affect are within normal limits. Scalp Mild inflammation with moderate pruritus.  This may be a combination of dermatitis plus cutaneous dysesthesia.  Pubic Itching without any visible inflammation or rash or tinea.  This most likely represents a form of neurogenic itch or cutaneous dysesthesia.  Chest - Medial [facility]) The nonpalpable micro petechial rash on lower extremities would fit the clinical pattern for so-called golfers or exercise vasculitis.  Review of systems negative.  If this does not clear in 3 to 4 weeks he will return for possible biopsy.  We will decide on freezing versus biopsy on the crust on his left ear at the same time.           A full examination was performed including scalp, head, eyes, ears, nose, lips, neck, chest, axillae, abdomen, back, buttocks, bilateral upper extremities, bilateral lower extremities, hands, feet, fingers, toes, fingernails, and toenails. All findings within normal limits unless otherwise noted below.   Assessment & Plan    Other atopic dermatitis Scalp  He will try topical clobetasol daily for 3 to 4 weeks and contact us via phone with a status update.  Related Medications clobetasol (TEMOVATE) 0.05 % external solution Apply 1 application topically 2 (two) times daily.  Pruritus Pubic  If out-of-pocket cost is not too high he will try topical doxepin (generic Zonalon).  Warned of a small risk of feeling tired.  He may apply this 1-3 times daily.  Related Medications Doxepin HCl  (ZONALON) 5 % CREA Apply 1 application topically daily.  Screening exam for skin cancer Chest - Medial [facility])      I, Lavonna Monarch, MD, have reviewed all documentation for this visit.  The documentation on 01/20/21 for the exam, diagnosis, procedures, and orders are all accurate and complete.

## 2021-01-25 ENCOUNTER — Other Ambulatory Visit: Payer: Self-pay

## 2021-01-25 ENCOUNTER — Ambulatory Visit: Payer: Medicare HMO | Admitting: Physician Assistant

## 2021-01-25 ENCOUNTER — Encounter: Payer: Self-pay | Admitting: Physician Assistant

## 2021-01-25 VITALS — BP 133/81 | HR 50 | Resp 15 | Ht 71.0 in | Wt 204.8 lb

## 2021-01-25 DIAGNOSIS — Z8669 Personal history of other diseases of the nervous system and sense organs: Secondary | ICD-10-CM | POA: Diagnosis not present

## 2021-01-25 DIAGNOSIS — M359 Systemic involvement of connective tissue, unspecified: Secondary | ICD-10-CM

## 2021-01-25 DIAGNOSIS — Z8719 Personal history of other diseases of the digestive system: Secondary | ICD-10-CM | POA: Diagnosis not present

## 2021-01-25 DIAGNOSIS — I7300 Raynaud's syndrome without gangrene: Secondary | ICD-10-CM | POA: Diagnosis not present

## 2021-01-25 DIAGNOSIS — Z889 Allergy status to unspecified drugs, medicaments and biological substances status: Secondary | ICD-10-CM

## 2021-01-25 DIAGNOSIS — M797 Fibromyalgia: Secondary | ICD-10-CM | POA: Diagnosis not present

## 2021-01-25 DIAGNOSIS — I10 Essential (primary) hypertension: Secondary | ICD-10-CM | POA: Diagnosis not present

## 2021-01-25 DIAGNOSIS — Z79899 Other long term (current) drug therapy: Secondary | ICD-10-CM | POA: Diagnosis not present

## 2021-01-25 DIAGNOSIS — E7800 Pure hypercholesterolemia, unspecified: Secondary | ICD-10-CM

## 2021-01-25 DIAGNOSIS — Z862 Personal history of diseases of the blood and blood-forming organs and certain disorders involving the immune mechanism: Secondary | ICD-10-CM

## 2021-01-25 DIAGNOSIS — Z9189 Other specified personal risk factors, not elsewhere classified: Secondary | ICD-10-CM

## 2021-01-25 DIAGNOSIS — J3089 Other allergic rhinitis: Secondary | ICD-10-CM | POA: Diagnosis not present

## 2021-01-25 DIAGNOSIS — H18519 Endothelial corneal dystrophy, unspecified eye: Secondary | ICD-10-CM

## 2021-01-25 DIAGNOSIS — J3081 Allergic rhinitis due to animal (cat) (dog) hair and dander: Secondary | ICD-10-CM | POA: Diagnosis not present

## 2021-01-25 DIAGNOSIS — J301 Allergic rhinitis due to pollen: Secondary | ICD-10-CM | POA: Diagnosis not present

## 2021-01-25 NOTE — Patient Instructions (Signed)

## 2021-01-26 NOTE — Progress Notes (Signed)
Glucose is 108.  Sodium is borderline low.  Rest of CMP WNL.  Rbc count, hgb, and hct continue to trend down. Please notify the patient and forward lab work to PCP as requested.

## 2021-01-27 LAB — COMPLETE METABOLIC PANEL WITH GFR
AG Ratio: 1.4 (calc) (ref 1.0–2.5)
ALT: 14 U/L (ref 9–46)
AST: 16 U/L (ref 10–35)
Albumin: 3.8 g/dL (ref 3.6–5.1)
Alkaline phosphatase (APISO): 104 U/L (ref 35–144)
BUN: 19 mg/dL (ref 7–25)
CO2: 27 mmol/L (ref 20–32)
Calcium: 8.9 mg/dL (ref 8.6–10.3)
Chloride: 100 mmol/L (ref 98–110)
Creat: 1.04 mg/dL (ref 0.70–1.28)
Globulin: 2.7 g/dL (calc) (ref 1.9–3.7)
Glucose, Bld: 108 mg/dL — ABNORMAL HIGH (ref 65–99)
Potassium: 4.9 mmol/L (ref 3.5–5.3)
Sodium: 134 mmol/L — ABNORMAL LOW (ref 135–146)
Total Bilirubin: 0.6 mg/dL (ref 0.2–1.2)
Total Protein: 6.5 g/dL (ref 6.1–8.1)
eGFR: 74 mL/min/{1.73_m2} (ref >=60)

## 2021-01-27 LAB — CBC WITH DIFFERENTIAL/PLATELET
Absolute Monocytes: 568 cells/uL (ref 200–950)
Basophils Absolute: 78 cells/uL (ref 0–200)
Basophils Relative: 1.1 %
Eosinophils Absolute: 256 cells/uL (ref 15–500)
Eosinophils Relative: 3.6 %
HCT: 35.2 % — ABNORMAL LOW (ref 38.5–50.0)
Hemoglobin: 11.5 g/dL — ABNORMAL LOW (ref 13.2–17.1)
Lymphs Abs: 1044 cells/uL (ref 850–3900)
MCH: 28.8 pg (ref 27.0–33.0)
MCHC: 32.7 g/dL (ref 32.0–36.0)
MCV: 88.2 fL (ref 80.0–100.0)
MPV: 9.6 fL (ref 7.5–12.5)
Monocytes Relative: 8 %
Neutro Abs: 5155 cells/uL (ref 1500–7800)
Neutrophils Relative %: 72.6 %
Platelets: 247 10*3/uL (ref 140–400)
RBC: 3.99 10*6/uL — ABNORMAL LOW (ref 4.20–5.80)
RDW: 14.9 % (ref 11.0–15.0)
Total Lymphocyte: 14.7 %
WBC: 7.1 10*3/uL (ref 3.8–10.8)

## 2021-01-27 LAB — ANTI-NUCLEAR AB-TITER (ANA TITER): ANA Titer 1: 1:640 {titer} — ABNORMAL HIGH

## 2021-01-27 LAB — ANA: Anti Nuclear Antibody (ANA): POSITIVE — AB

## 2021-02-01 DIAGNOSIS — J301 Allergic rhinitis due to pollen: Secondary | ICD-10-CM | POA: Diagnosis not present

## 2021-02-01 DIAGNOSIS — J3089 Other allergic rhinitis: Secondary | ICD-10-CM | POA: Diagnosis not present

## 2021-02-01 DIAGNOSIS — J3081 Allergic rhinitis due to animal (cat) (dog) hair and dander: Secondary | ICD-10-CM | POA: Diagnosis not present

## 2021-02-03 NOTE — Progress Notes (Signed)
Hemoglobin is low.  Glucose is mildly elevated probably not a fasting sample.  ANA is positive. No change in treatment advised.

## 2021-02-06 DIAGNOSIS — J3089 Other allergic rhinitis: Secondary | ICD-10-CM | POA: Diagnosis not present

## 2021-02-06 DIAGNOSIS — J301 Allergic rhinitis due to pollen: Secondary | ICD-10-CM | POA: Diagnosis not present

## 2021-02-06 DIAGNOSIS — J3081 Allergic rhinitis due to animal (cat) (dog) hair and dander: Secondary | ICD-10-CM | POA: Diagnosis not present

## 2021-02-10 DIAGNOSIS — H353211 Exudative age-related macular degeneration, right eye, with active choroidal neovascularization: Secondary | ICD-10-CM | POA: Diagnosis not present

## 2021-02-10 DIAGNOSIS — H353122 Nonexudative age-related macular degeneration, left eye, intermediate dry stage: Secondary | ICD-10-CM | POA: Diagnosis not present

## 2021-02-28 ENCOUNTER — Other Ambulatory Visit: Payer: Self-pay | Admitting: Physician Assistant

## 2021-02-28 DIAGNOSIS — J3081 Allergic rhinitis due to animal (cat) (dog) hair and dander: Secondary | ICD-10-CM | POA: Diagnosis not present

## 2021-02-28 DIAGNOSIS — J301 Allergic rhinitis due to pollen: Secondary | ICD-10-CM | POA: Diagnosis not present

## 2021-02-28 DIAGNOSIS — M359 Systemic involvement of connective tissue, unspecified: Secondary | ICD-10-CM

## 2021-02-28 DIAGNOSIS — J3089 Other allergic rhinitis: Secondary | ICD-10-CM | POA: Diagnosis not present

## 2021-02-28 NOTE — Telephone Encounter (Signed)
Please schedule patient for a follow up visit. Patient due January 2023. Thanks!

## 2021-02-28 NOTE — Telephone Encounter (Signed)
I LMOM for patient to call to schedule a follow up appointment  with Dr. Estanislado Pandy for around 06/27/2021.

## 2021-02-28 NOTE — Telephone Encounter (Signed)
Next Visit: Due January 2023. Message sent to the front to schedule patient   Last Visit: 01/25/2021  Last Fill: 12/26/2020 (30 day supply)  DX: Autoimmune disease   Current Dose per office note 01/25/2021: Methotrexate 8 tablets by mouth once weekly  Labs: 01/25/2021 Glucose is 108.  Sodium is borderline low.  Rest of CMP WNL.  Rbc count, hgb, and hct continue to trend down.   Okay to refill MTX?

## 2021-03-02 DIAGNOSIS — Z6828 Body mass index (BMI) 28.0-28.9, adult: Secondary | ICD-10-CM | POA: Diagnosis not present

## 2021-03-02 DIAGNOSIS — E663 Overweight: Secondary | ICD-10-CM | POA: Diagnosis not present

## 2021-03-02 DIAGNOSIS — J309 Allergic rhinitis, unspecified: Secondary | ICD-10-CM | POA: Diagnosis not present

## 2021-03-02 DIAGNOSIS — Z23 Encounter for immunization: Secondary | ICD-10-CM | POA: Diagnosis not present

## 2021-03-02 DIAGNOSIS — K649 Unspecified hemorrhoids: Secondary | ICD-10-CM | POA: Diagnosis not present

## 2021-03-06 DIAGNOSIS — J3081 Allergic rhinitis due to animal (cat) (dog) hair and dander: Secondary | ICD-10-CM | POA: Diagnosis not present

## 2021-03-06 DIAGNOSIS — J301 Allergic rhinitis due to pollen: Secondary | ICD-10-CM | POA: Diagnosis not present

## 2021-03-06 DIAGNOSIS — J3089 Other allergic rhinitis: Secondary | ICD-10-CM | POA: Diagnosis not present

## 2021-03-10 DIAGNOSIS — H353122 Nonexudative age-related macular degeneration, left eye, intermediate dry stage: Secondary | ICD-10-CM | POA: Diagnosis not present

## 2021-03-10 DIAGNOSIS — H353211 Exudative age-related macular degeneration, right eye, with active choroidal neovascularization: Secondary | ICD-10-CM | POA: Diagnosis not present

## 2021-03-13 DIAGNOSIS — J301 Allergic rhinitis due to pollen: Secondary | ICD-10-CM | POA: Diagnosis not present

## 2021-03-13 DIAGNOSIS — J3081 Allergic rhinitis due to animal (cat) (dog) hair and dander: Secondary | ICD-10-CM | POA: Diagnosis not present

## 2021-03-13 DIAGNOSIS — J3089 Other allergic rhinitis: Secondary | ICD-10-CM | POA: Diagnosis not present

## 2021-03-20 DIAGNOSIS — J3089 Other allergic rhinitis: Secondary | ICD-10-CM | POA: Diagnosis not present

## 2021-03-20 DIAGNOSIS — J3081 Allergic rhinitis due to animal (cat) (dog) hair and dander: Secondary | ICD-10-CM | POA: Diagnosis not present

## 2021-03-20 DIAGNOSIS — J301 Allergic rhinitis due to pollen: Secondary | ICD-10-CM | POA: Diagnosis not present

## 2021-03-27 DIAGNOSIS — J3081 Allergic rhinitis due to animal (cat) (dog) hair and dander: Secondary | ICD-10-CM | POA: Diagnosis not present

## 2021-03-27 DIAGNOSIS — J3089 Other allergic rhinitis: Secondary | ICD-10-CM | POA: Diagnosis not present

## 2021-03-27 DIAGNOSIS — J301 Allergic rhinitis due to pollen: Secondary | ICD-10-CM | POA: Diagnosis not present

## 2021-03-30 ENCOUNTER — Encounter (INDEPENDENT_AMBULATORY_CARE_PROVIDER_SITE_OTHER): Payer: Self-pay | Admitting: Gastroenterology

## 2021-03-30 ENCOUNTER — Other Ambulatory Visit: Payer: Self-pay

## 2021-03-30 ENCOUNTER — Ambulatory Visit (INDEPENDENT_AMBULATORY_CARE_PROVIDER_SITE_OTHER): Payer: Medicare HMO | Admitting: Gastroenterology

## 2021-03-30 VITALS — BP 135/75 | HR 68 | Temp 97.6°F | Ht 71.0 in | Wt 197.6 lb

## 2021-03-30 DIAGNOSIS — K649 Unspecified hemorrhoids: Secondary | ICD-10-CM | POA: Insufficient documentation

## 2021-03-30 DIAGNOSIS — R197 Diarrhea, unspecified: Secondary | ICD-10-CM

## 2021-03-30 MED ORDER — HYDROCORTISONE (PERIANAL) 2.5 % EX CREA: 1.0000 "application " | TOPICAL_CREAM | Freq: Two times a day (BID) | CUTANEOUS | 1 refills | Status: DC

## 2021-03-30 NOTE — Progress Notes (Signed)
Referring Provider: Redmond School, MD Primary Care Physician:  Redmond School, MD Primary GI Physician: Rehman  Chief Complaint  Patient presents with   Diarrhea    Pt states he was taking aleve and having diarrhea. Started taking tylenol and better now. Has a hemorroid that bleeds some.    HPI:   Cole Brown is a 75 y.o. male with past medical history of fibromyalgia, diverticular bleed (2018, no blood transfusion or endoscopic review), high cholesterol and HTN.   Patient presenting today for diarrhea.  Patient states about 2 months ago he had been using aleve for generalized pain, states he was taking approx 12 tablets per day for a few months and started to develop diarrhea. He is unable to pinpoint how many episodes of diarrhea he was having or how long the symptoms lasted. He reports that he stopped taking the aleve and started taking tylenol, diarrhea subsided thereafter. He denies any rectal bleeding or melena at the time. He reports some possible mild abdominal discomfort during that time but this has also subsided. He denies nausea or vomiting. No weight loss or issues with his appetite.  He does endorse some irritation/discomfort from a hemorrhoid. He Has 1-2 BMs per day without difficulty. Will occasionally have a little smear of blood when wiping. He is using otc preparation H with some relief, however, would like to try something a little stronger if possible.  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 He is not taking any other NSAIDs at this time.  Last Colonoscopy:august 2016 with multiple diverticula at sigmoid colon and external hemorrhoids Last Endoscopy:n/a  Past Medical History:  Diagnosis Date   Diverticula of colon    2016 colonoscopy   Fibromyalgia    Fibromyalgia    History of GI diverticular bleed    Hypercholesteremia    Hypertension     Past  Surgical History:  Procedure Laterality Date   allergy shots  weekly   CHOLECYSTECTOMY     COLONOSCOPY     Dr.Rehman   COLONOSCOPY N/A 01/27/2015   Procedure: COLONOSCOPY;  Surgeon: Rogene Houston, MD;  Location: [facility];  Service: Endoscopy;  Laterality: N/A;  930   EYE SURGERY Bilateral    partial cornea transplants    GALLBLADDER SURGERY  12/1993   UPPER GASTROINTESTINAL ENDOSCOPY      Current Outpatient Medications  Medication Sig Dispense Refill   amLODipine (NORVASC) 5 MG tablet Take 5 mg by mouth daily.     aspirin 325 MG EC tablet Take 325 mg by mouth daily.     atenolol (TENORMIN) 25 MG tablet TAKE (1) TABLET BY MOUTH ONCE DAILY. 90 tablet 3   azelastine (ASTELIN) 0.1 % nasal spray Place 1 spray into both nostrils 2 (two) times daily.     Cholecalciferol (VITAMIN D) 50 MCG (2000 UT) CAPS Take 2,000 Units by mouth daily.     clobetasol (TEMOVATE) 0.05 % external solution Apply 1 application topically 2 (two) times daily. (Patient taking differently: Apply 1 application topically as needed.) 50 mL 0   diphenhydrAMINE (BENADRYL) 25 MG tablet Take 25 mg by mouth as needed for allergies.      Doxepin HCl (ZONALON) 5 % CREA Apply 1 application topically daily. 45 g 2   EPINEPHrine 0.3 mg/0.3 mL IJ SOAJ injection INJECT INTO THIGH ASONEEDED FOR ALLERGIC REACTION.     famotidine (PEPCID) 20  MG tablet Take 20 mg by mouth daily.     FLUoxetine (PROZAC) 10 MG tablet Take 10 mg by mouth daily.     fluticasone (FLONASE) 50 MCG/ACT nasal spray SMARTSIG:1-2 Spray(s) Both Nares Daily     folic acid (FOLVITE) 1 MG tablet Take 2 tablets (2 mg total) by mouth daily. 180 tablet 3   hydrOXYzine (ATARAX/VISTARIL) 25 MG tablet Take 25 mg by mouth every 8 (eight) hours as needed.     Investigational - Study Medication Study name: atorvastatin 40mg  or placebo.     levocetirizine (XYZAL) 5 MG tablet Take 5 mg by mouth every evening.      methotrexate 2.5 MG tablet TAKE 8 TABLETS BY MOUTH ONCE  WEEKLY. 96 tablet 0   montelukast (SINGULAIR) 10 MG tablet Take 10 mg by mouth at bedtime.     Multiple Vitamins-Minerals (ICAPS AREDS 2 PO) Take by mouth.     naproxen sodium (ALEVE) 220 MG tablet Take 220 mg by mouth daily as needed.     Omega-3 Fatty Acids (FISH OIL) 1000 MG CAPS Take 1 capsule by mouth 2 (two) times daily.     Testosterone (ANDROGEL TD) Place onto the skin daily.     triazolam (HALCION) 0.25 MG tablet Take 0.25 mg by mouth at bedtime.      dicyclomine (BENTYL) 10 MG capsule Take 1 capsule (10 mg total) by mouth daily before breakfast. (Patient not taking: Reported on 03/30/2021) 30 capsule 1   No current facility-administered medications for this visit.    Allergies as of 03/30/2021 - Review Complete 03/30/2021  Allergen Reaction Noted   Plaquenil [hydroxychloroquine sulfate]  11/06/2018   Lodine [etodolac] Swelling and Rash 01/29/2013    Family History  Problem Relation Age of Onset   Heart disease Mother    Pancreatic cancer Mother    Prostate cancer Father    Healthy Sister    Parkinson's disease Brother    Asthma Sister    Healthy Sister     Social History   Socioeconomic History   Marital status: Married    Spouse name: Not on file   Number of children: Not on file   Years of education: Not on file   Highest education level: Not on file  Occupational History   Not on file  Tobacco Use   Smoking status: Never   Smokeless tobacco: Never  Vaping Use   Vaping Use: Never used  Substance and Sexual Activity   Alcohol use: Yes    Alcohol/week: 0.0 standard drinks    Comment: occas   Drug use: No   Sexual activity: Not on file  Other Topics Concern   Not on file  Social History Narrative   Not on file   Social Determinants of Health   Financial Resource Strain: Not on file  Food Insecurity: Not on file  Transportation Needs: Not on file  Physical Activity: Not on file  Stress: Not on file  Social Connections: Not on file   Review of  Systems: Gen: Denies fever, chills, anorexia. Denies fatigue, weakness, weight loss.  CV: Denies chest pain, palpitations, syncope, peripheral edema, and claudication. Resp: Denies dyspnea at rest, cough, wheezing, coughing up blood, and pleurisy. GI: Denies melena, hematochezia, nausea, vomiting, diarrhea, constipation, dysphagia, odyonophagia, early satiety or weight loss. +hemorrhoidal irritation Derm: Denies rash, itching, dry skin Psych: Denies depression, anxiety, memory loss, confusion. No homicidal or suicidal ideation.  Heme: Denies bruising, bleeding, and enlarged lymph nodes.  Physical Exam: BP 135/75 (  BP Location: Right Arm, Patient Position: Sitting, Cuff Size: Large)   Pulse 68   Temp 97.6 F (36.4 C) (Oral)   Ht 5\' 11"  (1.803 m)   Wt 197 lb 9.6 oz (89.6 kg)   BMI 27.56 kg/m  General:   Alert and oriented. No distress noted. Pleasant and cooperative.  Head:  Normocephalic and atraumatic. Eyes:  Conjuctiva clear without scleral icterus. Mouth:  Oral mucosa pink and moist. Good dentition. No lesions. Heart: Normal rate and rhythm, s1 and s2 heart sounds present.  Lungs: Clear lung sounds in all lobes. Respirations equal and unlabored. Abdomen:  +BS, soft, non-tender and non-distended. No rebound or guarding. No HSM or masses noted. Derm: No palmar erythema or jaundice Msk:  Symmetrical without gross deformities. Normal posture. Extremities:  Without edema. Neurologic:  Alert and  oriented x4 Psych:  Alert and cooperative. Normal mood and affect.  Invalid input(s): 6 MONTHS   ASSESSMENT: Cole Brown is a 75 y.o. male presenting today for diarrhea after heavy use of aleve a couple of months ago.   Patient presents for an acute course of diarrhea that occurred about 2 months ago that he feels was secondary to heavy aleve use as he was taking approximately 12 tablets per day over the course of a few months. He developed some non bloody diarrhea and some mild abdominal  pain. He felt symptoms were likely related to the aleve use and he stopped aleve and began taking tylenol. He reports that his symptoms resolved thereafter, has not had any other episodes of diarrhea or abdominal pain since. He is currently on 325mg  asa at the recommendation of his Rheumatologist, I advised him to avoid all other NSAIDs besides the ASA at this time. He is aware he should let us know if he develops recurrent diarrhea, melena, or hematochezia.  Currently he denies  melena, hematochezia, nausea, vomiting, diarrhea, constipation, dysphagia, odyonophagia, early satiety or weight loss.   He does experience some occasional discomfort/irritation from a hemorrhoid he has, with occasional smear of blood on toilet paper when wiping, he is using preparation H with some relief, however, still having some irritation. I have sent anusol cream BID for him to use. He will let me know if this does not improve his symptoms.  PLAN:  Avoid other NSAIDs besides your aspirin 2. Anusol cream BID for hemorrhoid 3. Patient to make [place] aware of any melena or hematochezia   Follow Up: As needed  Montey Ebel L. Alver Sorrow, MSN, APRN, AGNP-C Adult-Gerontology Nurse Practitioner [facility]

## 2021-03-30 NOTE — Patient Instructions (Signed)
Please avoid NSAIDs such as advil, ibuprofen, aleve, naproxen, good powder. You should continue your 325mg  aspirin at the direction of your Rheumatologist. Let us know if you have any rectal bleeding or black stools.  I have seen anusol cream for you to use twice a day for your hemorrhoids, please let me know if this is too expensive or does not help.  We will plan to see you as needed.

## 2021-04-03 DIAGNOSIS — J3081 Allergic rhinitis due to animal (cat) (dog) hair and dander: Secondary | ICD-10-CM | POA: Diagnosis not present

## 2021-04-03 DIAGNOSIS — J301 Allergic rhinitis due to pollen: Secondary | ICD-10-CM | POA: Diagnosis not present

## 2021-04-03 DIAGNOSIS — J3089 Other allergic rhinitis: Secondary | ICD-10-CM | POA: Diagnosis not present

## 2021-04-07 DIAGNOSIS — H26493 Other secondary cataract, bilateral: Secondary | ICD-10-CM | POA: Diagnosis not present

## 2021-04-07 DIAGNOSIS — H353211 Exudative age-related macular degeneration, right eye, with active choroidal neovascularization: Secondary | ICD-10-CM | POA: Diagnosis not present

## 2021-04-07 DIAGNOSIS — H43813 Vitreous degeneration, bilateral: Secondary | ICD-10-CM | POA: Diagnosis not present

## 2021-04-07 DIAGNOSIS — H353122 Nonexudative age-related macular degeneration, left eye, intermediate dry stage: Secondary | ICD-10-CM | POA: Diagnosis not present

## 2021-04-10 DIAGNOSIS — J3089 Other allergic rhinitis: Secondary | ICD-10-CM | POA: Diagnosis not present

## 2021-04-10 DIAGNOSIS — J3081 Allergic rhinitis due to animal (cat) (dog) hair and dander: Secondary | ICD-10-CM | POA: Diagnosis not present

## 2021-04-10 DIAGNOSIS — J301 Allergic rhinitis due to pollen: Secondary | ICD-10-CM | POA: Diagnosis not present

## 2021-04-12 DIAGNOSIS — G4733 Obstructive sleep apnea (adult) (pediatric): Secondary | ICD-10-CM | POA: Diagnosis not present

## 2021-04-17 DIAGNOSIS — J3089 Other allergic rhinitis: Secondary | ICD-10-CM | POA: Diagnosis not present

## 2021-04-17 DIAGNOSIS — J301 Allergic rhinitis due to pollen: Secondary | ICD-10-CM | POA: Diagnosis not present

## 2021-04-17 DIAGNOSIS — J3081 Allergic rhinitis due to animal (cat) (dog) hair and dander: Secondary | ICD-10-CM | POA: Diagnosis not present

## 2021-04-18 DIAGNOSIS — Z0100 Encounter for examination of eyes and vision without abnormal findings: Secondary | ICD-10-CM | POA: Diagnosis not present

## 2021-04-18 DIAGNOSIS — H26493 Other secondary cataract, bilateral: Secondary | ICD-10-CM | POA: Diagnosis not present

## 2021-04-18 DIAGNOSIS — H524 Presbyopia: Secondary | ICD-10-CM | POA: Diagnosis not present

## 2021-04-26 ENCOUNTER — Other Ambulatory Visit: Payer: Self-pay

## 2021-04-26 ENCOUNTER — Inpatient Hospital Stay (HOSPITAL_COMMUNITY): Payer: Medicare HMO | Attending: Hematology

## 2021-04-26 DIAGNOSIS — R7989 Other specified abnormal findings of blood chemistry: Secondary | ICD-10-CM | POA: Diagnosis not present

## 2021-04-26 DIAGNOSIS — I7300 Raynaud's syndrome without gangrene: Secondary | ICD-10-CM | POA: Diagnosis not present

## 2021-04-26 DIAGNOSIS — Z79899 Other long term (current) drug therapy: Secondary | ICD-10-CM | POA: Insufficient documentation

## 2021-04-26 DIAGNOSIS — J3089 Other allergic rhinitis: Secondary | ICD-10-CM | POA: Diagnosis not present

## 2021-04-26 DIAGNOSIS — M1380 Other specified arthritis, unspecified site: Secondary | ICD-10-CM | POA: Insufficient documentation

## 2021-04-26 DIAGNOSIS — J301 Allergic rhinitis due to pollen: Secondary | ICD-10-CM | POA: Diagnosis not present

## 2021-04-26 DIAGNOSIS — M797 Fibromyalgia: Secondary | ICD-10-CM | POA: Insufficient documentation

## 2021-04-26 DIAGNOSIS — J3081 Allergic rhinitis due to animal (cat) (dog) hair and dander: Secondary | ICD-10-CM | POA: Diagnosis not present

## 2021-04-26 DIAGNOSIS — R21 Rash and other nonspecific skin eruption: Secondary | ICD-10-CM | POA: Insufficient documentation

## 2021-04-26 DIAGNOSIS — R760 Raised antibody titer: Secondary | ICD-10-CM | POA: Insufficient documentation

## 2021-04-26 DIAGNOSIS — M359 Systemic involvement of connective tissue, unspecified: Secondary | ICD-10-CM

## 2021-04-26 LAB — COMPREHENSIVE METABOLIC PANEL
ALT: 19 U/L (ref 0–44)
AST: 19 U/L (ref 15–41)
Albumin: 3.6 g/dL (ref 3.5–5.0)
Alkaline Phosphatase: 112 U/L (ref 38–126)
Anion gap: 6 (ref 5–15)
BUN: 17 mg/dL (ref 8–23)
CO2: 26 mmol/L (ref 22–32)
Calcium: 8.6 mg/dL — ABNORMAL LOW (ref 8.9–10.3)
Chloride: 99 mmol/L (ref 98–111)
Creatinine, Ser: 0.92 mg/dL (ref 0.61–1.24)
GFR, Estimated: >60 mL/min (ref >60)
Glucose, Bld: 118 mg/dL — ABNORMAL HIGH (ref 70–99)
Potassium: 3.9 mmol/L (ref 3.5–5.1)
Sodium: 131 mmol/L — ABNORMAL LOW (ref 135–145)
Total Bilirubin: 0.8 mg/dL (ref 0.3–1.2)
Total Protein: 6.8 g/dL (ref 6.5–8.1)

## 2021-04-26 LAB — CBC WITH DIFFERENTIAL/PLATELET
Abs Immature Granulocytes: 0.02 10*3/uL (ref 0.00–0.07)
Basophils Absolute: 0.1 10*3/uL (ref 0.0–0.1)
Basophils Relative: 1 %
Eosinophils Absolute: 0.3 10*3/uL (ref 0.0–0.5)
Eosinophils Relative: 4 %
HCT: 34.8 % — ABNORMAL LOW (ref 39.0–52.0)
Hemoglobin: 11.7 g/dL — ABNORMAL LOW (ref 13.0–17.0)
Immature Granulocytes: 0 %
Lymphocytes Relative: 19 %
Lymphs Abs: 1.5 10*3/uL (ref 0.7–4.0)
MCH: 29.6 pg (ref 26.0–34.0)
MCHC: 33.6 g/dL (ref 30.0–36.0)
MCV: 88.1 fL (ref 80.0–100.0)
Monocytes Absolute: 0.3 10*3/uL (ref 0.1–1.0)
Monocytes Relative: 4 %
Neutro Abs: 5.5 10*3/uL (ref 1.7–7.7)
Neutrophils Relative %: 72 %
Platelets: 264 10*3/uL (ref 150–400)
RBC: 3.95 MIL/uL — ABNORMAL LOW (ref 4.22–5.81)
RDW: 16.2 % — ABNORMAL HIGH (ref 11.5–15.5)
WBC: 7.7 10*3/uL (ref 4.0–10.5)
nRBC: 0 % (ref 0.0–0.2)

## 2021-04-26 LAB — D-DIMER, QUANTITATIVE: D-Dimer, Quant: 0.8 ug/mL-FEU — ABNORMAL HIGH (ref 0.00–0.50)

## 2021-04-27 DIAGNOSIS — H353211 Exudative age-related macular degeneration, right eye, with active choroidal neovascularization: Secondary | ICD-10-CM | POA: Diagnosis not present

## 2021-04-27 DIAGNOSIS — H26493 Other secondary cataract, bilateral: Secondary | ICD-10-CM | POA: Diagnosis not present

## 2021-04-27 DIAGNOSIS — H353122 Nonexudative age-related macular degeneration, left eye, intermediate dry stage: Secondary | ICD-10-CM | POA: Diagnosis not present

## 2021-05-01 DIAGNOSIS — J3089 Other allergic rhinitis: Secondary | ICD-10-CM | POA: Diagnosis not present

## 2021-05-01 DIAGNOSIS — J3081 Allergic rhinitis due to animal (cat) (dog) hair and dander: Secondary | ICD-10-CM | POA: Diagnosis not present

## 2021-05-01 DIAGNOSIS — J301 Allergic rhinitis due to pollen: Secondary | ICD-10-CM | POA: Diagnosis not present

## 2021-05-01 NOTE — Progress Notes (Signed)
Cole Brown, Cole Brown   CLINIC:  Medical Oncology/Hematology  PCP:  Redmond School, [place] / [place] [address]  [phone]  REASON FOR VISIT:  Follow-up for autoimmune disease and anti-cardiolipin antibodies positivity  PRIOR THERAPY: none  CURRENT THERAPY: methotrexate 8 tablets/week  INTERVAL HISTORY:  [name], a 75 y.o. male, returns for routine follow-up for his autoimmune disease and anti-cardiolipin antibodies positivity. Cole Brown was last seen on 10/24/2020.  Today he reports feeling good. His weight is stable. The generalized joint pains from his arthritis is stable. He denies any current bleeding or easy bruising, black stools, rash, SOB, and leg and arm swelling.   REVIEW OF SYSTEMS:  Review of Systems  Constitutional:  Negative for appetite change, fatigue (80%) and unexpected weight change (stable).  HENT:   Negative for nosebleeds.   Respiratory:  Negative for shortness of breath.   Cardiovascular:  Positive for chest pain. Negative for leg swelling.  Gastrointestinal:  Negative for blood in stool.  Genitourinary:  Negative for hematuria.   Musculoskeletal:  Positive for myalgias (4/10 fibromyalgia). Negative for arthralgias (stable).  Skin:  Negative for rash.  Neurological:  Positive for headaches.  Hematological:  Does not bruise/bleed easily.  All other systems reviewed and are negative.  PAST MEDICAL/SURGICAL HISTORY:  Past Medical History:  Diagnosis Date   Diverticula of colon    2016 colonoscopy   Fibromyalgia    Fibromyalgia    History of GI diverticular bleed    Hypercholesteremia    Hypertension    Past Surgical History:  Procedure Laterality Date   allergy shots  weekly   CHOLECYSTECTOMY     COLONOSCOPY     Dr.Rehman   COLONOSCOPY N/A 01/27/2015   Rehman: multiple diverticula at sigmoid colon,ext hemorrhoids   EYE SURGERY Bilateral    partial cornea  transplants    GALLBLADDER SURGERY  12/1993   UPPER GASTROINTESTINAL ENDOSCOPY      SOCIAL HISTORY:  Social History   Socioeconomic History   Marital status: Married    Spouse name: Not on file   Number of children: Not on file   Years of education: Not on file   Highest education level: Not on file  Occupational History   Not on file  Tobacco Use   Smoking status: Never   Smokeless tobacco: Never  Vaping Use   Vaping Use: Never used  Substance and Sexual Activity   Alcohol use: Yes    Alcohol/week: 0.0 standard drinks    Comment: occas   Drug use: No   Sexual activity: Not on file  Other Topics Concern   Not on file  Social History Narrative   Not on file   Social Determinants of Health   Financial Resource Strain: Not on file  Food Insecurity: Not on file  Transportation Needs: Not on file  Physical Activity: Not on file  Stress: Not on file  Social Connections: Not on file  Intimate Partner Violence: Not on file    FAMILY HISTORY:  Family History  Problem Relation Age of Onset   Heart disease Mother    Pancreatic cancer Mother    Prostate cancer Father    Healthy Sister    Parkinson's disease Brother    Asthma Sister    Healthy Sister     CURRENT MEDICATIONS:  Current Outpatient Medications  Medication Sig Dispense Refill   amLODipine (NORVASC) 5 MG tablet Take 5 mg by mouth  daily.     aspirin 325 MG EC tablet Take 325 mg by mouth daily.     atenolol (TENORMIN) 25 MG tablet TAKE (1) TABLET BY MOUTH ONCE DAILY. 90 tablet 3   azelastine (ASTELIN) 0.1 % nasal spray Place 1 spray into both nostrils 2 (two) times daily.     Cholecalciferol (VITAMIN D) 50 MCG (2000 UT) CAPS Take 2,000 Units by mouth daily.     clobetasol (TEMOVATE) 0.05 % external solution Apply 1 application topically 2 (two) times daily. (Patient taking differently: Apply 1 application topically as needed.) 50 mL 0   dicyclomine (BENTYL) 10 MG capsule Take 1 capsule (10 mg total) by  mouth daily before breakfast. (Patient not taking: Reported on 03/30/2021) 30 capsule 1   diphenhydrAMINE (BENADRYL) 25 MG tablet Take 25 mg by mouth as needed for allergies.      Doxepin HCl (ZONALON) 5 % CREA Apply 1 application topically daily. 45 g 2   EPINEPHrine 0.3 mg/0.3 mL IJ SOAJ injection INJECT INTO THIGH ASONEEDED FOR ALLERGIC REACTION.     famotidine (PEPCID) 20 MG tablet Take 20 mg by mouth daily.     FLUoxetine (PROZAC) 10 MG tablet Take 10 mg by mouth daily.     fluticasone (FLONASE) 50 MCG/ACT nasal spray SMARTSIG:1-2 Spray(s) Both Nares Daily     folic acid (FOLVITE) 1 MG tablet Take 2 tablets (2 mg total) by mouth daily. 180 tablet 3   hydrocortisone (ANUSOL-HC) 2.5 % rectal cream Place 1 application rectally 2 (two) times daily. 30 g 1   hydrOXYzine (ATARAX/VISTARIL) 25 MG tablet Take 25 mg by mouth every 8 (eight) hours as needed.     Investigational - Study Medication Study name: atorvastatin 40mg  or placebo.     levocetirizine (XYZAL) 5 MG tablet Take 5 mg by mouth every evening.      methotrexate 2.5 MG tablet TAKE 8 TABLETS BY MOUTH ONCE WEEKLY. 96 tablet 0   montelukast (SINGULAIR) 10 MG tablet Take 10 mg by mouth at bedtime.     Multiple Vitamins-Minerals (ICAPS AREDS 2 PO) Take by mouth.     naproxen sodium (ALEVE) 220 MG tablet Take 220 mg by mouth daily as needed.     Omega-3 Fatty Acids (FISH OIL) 1000 MG CAPS Take 1 capsule by mouth 2 (two) times daily.     Testosterone (ANDROGEL TD) Place onto the skin daily.     triazolam (HALCION) 0.25 MG tablet Take 0.25 mg by mouth at bedtime.      No current facility-administered medications for this visit.    ALLERGIES:  Allergies  Allergen Reactions   Plaquenil [Hydroxychloroquine Sulfate]     rash   Lodine [Etodolac] Swelling and Rash    PHYSICAL EXAM:  Performance status (ECOG): 1 - Symptomatic but completely ambulatory  There were no vitals filed for this visit. Wt Readings from Last 3 Encounters:   03/30/21 197 lb 9.6 oz (89.6 kg)  01/25/21 204 lb 12.8 oz (92.9 kg)  10/24/20 199 lb (90.3 kg)   Physical Exam Vitals reviewed.  Constitutional:      Appearance: Normal appearance.  Cardiovascular:     Rate and Rhythm: Normal rate and regular rhythm.     Pulses: Normal pulses.     Heart sounds: Normal heart sounds.  Pulmonary:     Effort: Pulmonary effort is normal.     Breath sounds: Normal breath sounds.  Abdominal:     Palpations: Abdomen is soft. There is no hepatomegaly, splenomegaly or  mass.     Tenderness: There is no abdominal tenderness.  Musculoskeletal:     Right lower leg: No edema.     Left lower leg: No edema.  Neurological:     General: No focal deficit present.     Mental Status: He is alert and oriented to person, place, and time.  Psychiatric:        Mood and Affect: Mood normal.        Behavior: Behavior normal.    LABORATORY DATA:  I have reviewed the labs as listed.  CBC Latest Ref Rng & Units 04/26/2021 01/25/2021 08/23/2020  WBC 4.0 - 10.5 K/uL 7.7 7.1 6.7  Hemoglobin 13.0 - 17.0 g/dL 11.7(L) 11.5(L) 12.3(L)  Hematocrit 39.0 - 52.0 % 34.8(L) 35.2(L) 37.2(L)  Platelets 150 - 400 K/uL 264 247 234   CMP Latest Ref Rng & Units 04/26/2021 01/25/2021 08/23/2020  Glucose 70 - 99 mg/dL 118(H) 108(H) 123(H)  BUN 8 - 23 mg/dL 17 19 18   Creatinine 0.61 - 1.24 mg/dL 0.92 1.04 0.98  Sodium 135 - 145 mmol/L 131(L) 134(L) 134  Potassium 3.5 - 5.1 mmol/L 3.9 4.9 4.7  Chloride 98 - 111 mmol/L 99 100 99  CO2 22 - 32 mmol/L 26 27 20   Calcium 8.9 - 10.3 mg/dL 8.6(L) 8.9 9.0  Total Protein 6.5 - 8.1 g/dL 6.8 6.5 6.4  Total Bilirubin 0.3 - 1.2 mg/dL 0.8 0.6 0.5  Alkaline Phos 38 - 126 U/L 112 - 122(H)  AST 15 - 41 U/L 19 16 16   ALT 0 - 44 U/L 19 14 16       Component Value Date/Time   RBC 3.95 (L) 04/26/2021 1445   MCV 88.1 04/26/2021 1445   MCV 89 08/23/2020 1415   MCH 29.6 04/26/2021 1445   MCHC 33.6 04/26/2021 1445   RDW 16.2 (H) 04/26/2021 1445   RDW 14.9  08/23/2020 1415   LYMPHSABS 1.5 04/26/2021 1445   LYMPHSABS 1.4 08/23/2020 1415   MONOABS 0.3 04/26/2021 1445   EOSABS 0.3 04/26/2021 1445   EOSABS 0.2 08/23/2020 1415   BASOSABS 0.1 04/26/2021 1445   BASOSABS 0.1 08/23/2020 1415    DIAGNOSTIC IMAGING:  I have independently reviewed the scans and discussed with the patient. No results found.   ASSESSMENT:  1.  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  He has intermittent [name] phenomena. -Currently being treated with methotrexate 8 tablets/week under the direction of Dr. Estanislado Pandy.  Methotrexate has helped with joint pains. -Patient had positive antibeta-2 glycoprotein 1 IgM antibody (greater than 112) twice in March and July of this year.  He also has anticardiolipin antibody IgM positive (greater than 112) on 2 different occasions at least 3 months apart.  Lupus anticoagulant was negative twice. -He does not have any history of arterial or venous thrombosis.  No history of strokes. -No evidence of renal involvement or lung involvement.  Echocardiogram in 2018 did not show any major valvular involvement. -He has mild erythematous maculopapular rash about the medial ankles.   2.  Social/family history: -He used to work in [employer] prior to retirement.  Lives at home with his wife.  Non-smoker. -No family history of lupus anticoagulant or miscarriages or DVTs.  Father had prostate cancer and mother had pancreatic cancer.  Several paternal aunts had cancer, unknown type to the patient.   PLAN:  1.  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  Anti-B2 glycoprotein 1 IgM antibody was more than 150 on many occasions.  Anticardiolipin IgM antibody improved to 13. - Patients with positive antiphospholipid  antibody and other connective tissue disorders are at increased risk for thrombosis based on several studies. - I have educated him about high risk of thrombosis and seek immediate medical attention for any leg swelling, pain in the legs, pleuritic chest pain and shortness of breath. - Continue aspirin 325 mg daily for primary prophylaxis. - Reviewed labs from 04/26/2021 which showed slightly elevated D-dimer of 0.8.  He does not have any signs or symptoms of DVT or PE at this time. - Elevated D-dimer likely from autoimmune disease. - RTC 1 year for follow-up with repeat D-dimer.   Orders placed this encounter:  No orders of the defined types were placed in this encounter.    Cole Jack, MD [place] [phone]   I, Cole Brown, am acting as a scribe for Dr. Derek Brown.  I, Cole Jack MD, have reviewed the above documentation for accuracy and completeness, and I agree with the above.

## 2021-05-02 ENCOUNTER — Other Ambulatory Visit: Payer: Self-pay

## 2021-05-02 ENCOUNTER — Inpatient Hospital Stay (HOSPITAL_COMMUNITY): Payer: Medicare HMO | Admitting: Hematology

## 2021-05-02 VITALS — BP 157/76 | HR 62 | Temp 98.1°F | Resp 18 | Wt 201.4 lb

## 2021-05-02 DIAGNOSIS — M359 Systemic involvement of connective tissue, unspecified: Secondary | ICD-10-CM | POA: Diagnosis not present

## 2021-05-02 DIAGNOSIS — R21 Rash and other nonspecific skin eruption: Secondary | ICD-10-CM | POA: Diagnosis not present

## 2021-05-02 DIAGNOSIS — R760 Raised antibody titer: Secondary | ICD-10-CM | POA: Diagnosis not present

## 2021-05-02 DIAGNOSIS — Z79899 Other long term (current) drug therapy: Secondary | ICD-10-CM | POA: Diagnosis not present

## 2021-05-02 DIAGNOSIS — M1380 Other specified arthritis, unspecified site: Secondary | ICD-10-CM | POA: Diagnosis not present

## 2021-05-02 DIAGNOSIS — I7300 Raynaud's syndrome without gangrene: Secondary | ICD-10-CM | POA: Diagnosis not present

## 2021-05-02 DIAGNOSIS — R7989 Other specified abnormal findings of blood chemistry: Secondary | ICD-10-CM | POA: Diagnosis not present

## 2021-05-02 DIAGNOSIS — M797 Fibromyalgia: Secondary | ICD-10-CM | POA: Diagnosis not present

## 2021-05-02 NOTE — Patient Instructions (Signed)
[facility] Discharge Instructions  You were seen and examined today by Dr. Delton Coombes. He reviewed your lab results with you which showed slight anemia. Your D-dimer, which shows clotting activity in your body, is elelvated. Because you have an antiphospholipid antibody, you are more prone to forming blood clots in your body. Report to the ER or call the clinic if you develop any unusual shortness of breath, or unusual swelling and/or pain in your legs or arms.    Thank you for choosing [place] to provide your oncology and hematology care.  To afford each patient quality time with our provider, please arrive at least 15 minutes before your scheduled appointment time.   If you have a lab appointment with the [facility] please come in thru the Main Entrance and check in at the main information desk.  You need to re-schedule your appointment should you arrive 10 or more minutes late.  We strive to give you quality time with our providers, and arriving late affects you and other patients whose appointments are after yours.  Also, if you no show three or more times for appointments you may be dismissed from the clinic at the providers discretion.     Again, thank you for choosing [facility]  Our hope is that these requests will decrease the amount of time that you wait before being seen by our physicians.       _____________________________________________________________  Should you have questions after your visit to [facility], please contact our office at [phone] and follow the prompts.  Our office hours are 8:00 a.m. and 4:30 p.m. Monday - Friday.  Please note that voicemails left after 4:00 p.m. may not be returned until the following business day.  We are closed weekends and major holidays.  You do have access to a nurse 24-7, just call the main number to the clinic [phone]  and do not press any options, hold on the line and a nurse will answer the phone.    For prescription refill requests, have your pharmacy contact our office and allow 72 hours.    Due to Covid, you will need to wear a mask upon entering the hospital. If you do not have a mask, a mask will be given to you at the Main Entrance upon arrival. For doctor visits, patients may have 1 support person age 17 or older with them. For treatment visits, patients can not have anyone with them due to social distancing guidelines and our immunocompromised population.

## 2021-05-03 ENCOUNTER — Ambulatory Visit (HOSPITAL_COMMUNITY): Payer: Medicare HMO | Admitting: Hematology

## 2021-05-05 DIAGNOSIS — H353211 Exudative age-related macular degeneration, right eye, with active choroidal neovascularization: Secondary | ICD-10-CM | POA: Diagnosis not present

## 2021-05-05 DIAGNOSIS — H353122 Nonexudative age-related macular degeneration, left eye, intermediate dry stage: Secondary | ICD-10-CM | POA: Diagnosis not present

## 2021-05-09 DIAGNOSIS — J301 Allergic rhinitis due to pollen: Secondary | ICD-10-CM | POA: Diagnosis not present

## 2021-05-09 DIAGNOSIS — J3089 Other allergic rhinitis: Secondary | ICD-10-CM | POA: Diagnosis not present

## 2021-05-09 DIAGNOSIS — J3081 Allergic rhinitis due to animal (cat) (dog) hair and dander: Secondary | ICD-10-CM | POA: Diagnosis not present

## 2021-05-15 DIAGNOSIS — J3089 Other allergic rhinitis: Secondary | ICD-10-CM | POA: Diagnosis not present

## 2021-05-15 DIAGNOSIS — J301 Allergic rhinitis due to pollen: Secondary | ICD-10-CM | POA: Diagnosis not present

## 2021-05-15 DIAGNOSIS — J3081 Allergic rhinitis due to animal (cat) (dog) hair and dander: Secondary | ICD-10-CM | POA: Diagnosis not present

## 2021-05-22 DIAGNOSIS — J3089 Other allergic rhinitis: Secondary | ICD-10-CM | POA: Diagnosis not present

## 2021-05-22 DIAGNOSIS — J3081 Allergic rhinitis due to animal (cat) (dog) hair and dander: Secondary | ICD-10-CM | POA: Diagnosis not present

## 2021-05-22 DIAGNOSIS — J301 Allergic rhinitis due to pollen: Secondary | ICD-10-CM | POA: Diagnosis not present

## 2021-05-31 DIAGNOSIS — J301 Allergic rhinitis due to pollen: Secondary | ICD-10-CM | POA: Diagnosis not present

## 2021-06-05 ENCOUNTER — Other Ambulatory Visit: Payer: Self-pay | Admitting: Physician Assistant

## 2021-06-05 DIAGNOSIS — M359 Systemic involvement of connective tissue, unspecified: Secondary | ICD-10-CM

## 2021-06-05 NOTE — Telephone Encounter (Signed)
Next Visit: 07/03/2021   Last Visit: 01/25/2021   Last Fill: 03/22/2020   DX: Autoimmune disease   Current Dose per office note 07/16/2079: folic acid 2 mg by mouth daily   Okay to refill Folic Acid?

## 2021-06-16 DIAGNOSIS — I10 Essential (primary) hypertension: Secondary | ICD-10-CM | POA: Diagnosis not present

## 2021-06-16 DIAGNOSIS — E782 Mixed hyperlipidemia: Secondary | ICD-10-CM | POA: Diagnosis not present

## 2021-06-19 NOTE — Progress Notes (Signed)
Office Visit Note  Patient: Cole Brown             Date of Birth: [birth year]           MRN: [id number]             PCP: Redmond School, MD Referring: Redmond School, MD Visit Date: 07/03/2021 Occupation: @GUAROCC @  Subjective:  Right hip pain  History of Present Illness: KALEAB FRASIER is a 76 y.o. male with a history of autoimmune disease, osteoarthritis and fibromyalgia.  He states for the last 1 week his right hip has been bothering which she points to the trochanteric area.  He states he woke up 1 morning with the right hip pain and its been persistent since then.  None of the other joints are painful.  He has been tolerating methotrexate well.  He has not had much issues with [name] phenomenon during this winter.  He takes aspirin 325 mg p.o. daily.  Activities of Daily Living:  Patient reports morning stiffness for 0 minutes.   Patient Denies nocturnal pain.  Difficulty dressing/grooming: Denies Difficulty climbing stairs: Denies Difficulty getting out of chair: Denies Difficulty using hands for taps, buttons, cutlery, and/or writing: Denies  Review of Systems  Constitutional:  Negative for fatigue.  HENT:  Positive for mouth sores, mouth dryness and nose dryness.   Eyes:  Positive for dryness. Negative for pain and itching.  Respiratory:  Negative for shortness of breath and difficulty breathing.   Cardiovascular:  Negative for chest pain and palpitations.  Gastrointestinal:  Negative for blood in stool, constipation and diarrhea.  Endocrine: Negative for increased urination.  Genitourinary:  Negative for difficulty urinating.  Musculoskeletal:  Positive for joint pain, joint pain, myalgias and myalgias. Negative for joint swelling, morning stiffness and muscle tenderness.  Skin:  Negative for color change, rash, redness and sensitivity to sunlight.  Allergic/Immunologic: Negative for susceptible to infections.  Neurological:  Negative for dizziness, numbness,  headaches, memory loss and weakness.  Hematological:  Positive for bruising/bleeding tendency.  Psychiatric/Behavioral:  Negative for depressed mood, confusion and sleep disturbance. The patient is not nervous/anxious.    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   Fibromyalgia 08/21/2018   History of multiple allergies 08/21/2018   History of sleep apnea 08/21/2018   Rectal bleed 11/14/2016   Essential hypertension 11/14/2016    Past Medical History:  Diagnosis Date   Diverticula of colon    2016 colonoscopy   Fibromyalgia    Fibromyalgia    History of GI diverticular bleed    Hypercholesteremia    Hypertension     Family History  Problem Relation Age of Onset   Heart disease Mother    Pancreatic cancer Mother    Prostate cancer Father    Healthy Sister    Parkinson's disease Brother    Asthma Sister    Healthy Sister    Past Surgical History:  Procedure Laterality Date   allergy shots  weekly   CHOLECYSTECTOMY     COLONOSCOPY     Dr.Rehman   COLONOSCOPY N/A 01/27/2015   Rehman: multiple diverticula at sigmoid colon,ext hemorrhoids  EYE SURGERY Bilateral    partial cornea transplants    GALLBLADDER SURGERY  12/1993   UPPER GASTROINTESTINAL ENDOSCOPY     Social History   Social History Narrative   Not on file   Immunization History  Administered Date(s) Administered   Influenza-Unspecified 04/16/2020   PFIZER(Purple Top)SARS-COV-2 Vaccination 08/05/2019, 08/26/2019   Zoster Recombinat (Shingrix) 01/03/2018, 06/12/2018     Objective: Vital Signs: BP (!) 149/90  (BP Location: Left Arm, Patient Position: Sitting, Cuff Size: Normal)    Pulse 64    Ht 6' (1.829 m)    Wt 201 lb 6.4 oz (91.4 kg)    BMI 27.31 kg/m    Physical Exam Vitals and nursing note reviewed.  Constitutional:      Appearance: He is well-developed.  HENT:     Head: Normocephalic and atraumatic.  Eyes:     Conjunctiva/sclera: Conjunctivae normal.     Pupils: Pupils are equal, round, and reactive to light.  Cardiovascular:     Rate and Rhythm: Normal rate and regular rhythm.     Heart sounds: Normal heart sounds.  Pulmonary:     Effort: Pulmonary effort is normal.     Breath sounds: Normal breath sounds.  Abdominal:     General: Bowel sounds are normal.     Palpations: Abdomen is soft.  Musculoskeletal:     Cervical back: Normal range of motion and neck supple.  Skin:    General: Skin is warm and dry.     Capillary Refill: Capillary refill takes less than 2 seconds.  Neurological:     Mental Status: He is alert and oriented to person, place, and time.  Psychiatric:        Behavior: Behavior normal.     Musculoskeletal Exam: C-spine was in good range of motion.  Shoulder joints, elbow joints, wrist joints, MCPs PIPs and DIPs with good range of motion with no synovitis.  Hip joints, knee joints, ankles, MTPs with good range of motion with no synovitis.  He had tenderness on palpation over right trochanteric bursa.  CDAI Exam: CDAI Score: -- Patient Global: --; Provider Global: -- Swollen: --; Tender: -- Joint Exam 07/03/2021   No joint exam has been documented for this visit   There is currently no information documented on the homunculus. Go to the Rheumatology activity and complete the homunculus joint exam.  Investigation: No additional findings.  Imaging: No results found.  Recent Labs: Lab Results  Component Value Date   WBC 7.7 04/26/2021   HGB 11.7 (L) 04/26/2021   PLT 264 04/26/2021   NA 131 (L) 04/26/2021   K 3.9 04/26/2021   CL 99 04/26/2021    CO2 26 04/26/2021   GLUCOSE 118 (H) 04/26/2021   BUN 17 04/26/2021   CREATININE 0.92 04/26/2021   BILITOT 0.8 04/26/2021   ALKPHOS 112 04/26/2021   AST 19 04/26/2021   ALT 19 04/26/2021   PROT 6.8 04/26/2021   ALBUMIN 3.6 04/26/2021   CALCIUM 8.6 (L) 04/26/2021   GFRAA 89 03/22/2020   QFTBGOLDPLUS NEGATIVE 08/21/2018    Oct 17, 2020 beta2 GP 1 IgM> 150, anticardiolipin antibody IgM 13, lupus anticoagulant negative  January 25, 2021 ANA 1: 640NH  Speciality Comments: Plaquenil April 2020-discontinued due to rash Methotrexate is started Nov 06, 2018  Procedures:  No procedures performed Allergies: Plaquenil [hydroxychloroquine sulfate] and Lodine [etodolac]   Assessment / Plan:     Visit Diagnoses: Autoimmune disease ([place]) - ANA+ in 2018, ENA negative, [name] phenomenon, beta-2  GP 1 IgM positive, anticardiolipin IgM positive, elevated ESR, severe inflammatory arthritis: -Patient states that she developed some oral ulcers recently which she relates to allergies.  He continues to have dry mouth and dry eyes.  He denies any joint swelling.  He has been tolerating methotrexate without any side effects.  We will obtain labs in February.  Plan: Urinalysis, Routine w reflex microscopic, Sedimentation rate, Anti-DNA antibody, double-stranded, C3 and C4  Anti-cardiolipin antibody positive - He is followed by hematologist, Dr. Debe Coder.  He is on aspirin 325 mg p.o. daily.  There is no history of arterial or venous thrombosis. - Plan: Beta-2 glycoprotein antibodies, Cardiolipin antibodies, IgG, IgM, IgA  High risk medication use - Methotrexate 8 tablets by mouth once weekly, folic acid 2 mg by mouth daily. - Plan: CBC with Differential/Platelet, COMPLETE METABOLIC PANEL WITH GFR in February and then every 3 months.  He has been advised to stop methotrexate in case he develops an infection and resume after the infection resolves.  Information on immunization was placed in the  AVS.  Trochanteric bursitis, right hip-he had tenderness on palpation of right trochanteric bursa consistent with trochanteric bursitis.  A handout on IT band stretches was given.  Use of topical Aspercreme was discussed.  I also discussed possible use of cortisone if the symptoms persist.  [name] disease without gangrene-according the patient Raynauds is not very active this winter.  Fibromyalgia-he continues to have some generalized pain and discomfort.  Need for regular exercise and stretching was emphasized.  Other medical problems are listed as follows:  History of iron deficiency anemia  History of diverticulitis  Fuchs' corneal dystrophy, unspecified laterality  Essential hypertension  Hypercholesteremia  History of sleep apnea  History of multiple allergies  Orders: Orders Placed This Encounter  Procedures   CBC with Differential/Platelet   COMPLETE METABOLIC PANEL WITH GFR   Urinalysis, Routine w reflex microscopic   Sedimentation rate   Anti-DNA antibody, double-stranded   Beta-2 glycoprotein antibodies   Cardiolipin antibodies, IgG, IgM, IgA   C3 and C4   No orders of the defined types were placed in this encounter.    Follow-Up Instructions: Return for Autoimmune disease.   Bo Merino, MD  Note - This record has been created using Editor, commissioning.  Chart creation errors have been sought, but may not always  have been located. Such creation errors do not reflect on  the standard of medical care.

## 2021-06-20 DIAGNOSIS — J3081 Allergic rhinitis due to animal (cat) (dog) hair and dander: Secondary | ICD-10-CM | POA: Diagnosis not present

## 2021-06-20 DIAGNOSIS — J301 Allergic rhinitis due to pollen: Secondary | ICD-10-CM | POA: Diagnosis not present

## 2021-06-20 DIAGNOSIS — J3089 Other allergic rhinitis: Secondary | ICD-10-CM | POA: Diagnosis not present

## 2021-06-22 ENCOUNTER — Other Ambulatory Visit: Payer: Self-pay | Admitting: Physician Assistant

## 2021-06-22 DIAGNOSIS — M359 Systemic involvement of connective tissue, unspecified: Secondary | ICD-10-CM

## 2021-06-24 ENCOUNTER — Other Ambulatory Visit: Payer: Self-pay | Admitting: Physician Assistant

## 2021-06-24 DIAGNOSIS — M359 Systemic involvement of connective tissue, unspecified: Secondary | ICD-10-CM

## 2021-06-26 DIAGNOSIS — J3089 Other allergic rhinitis: Secondary | ICD-10-CM | POA: Diagnosis not present

## 2021-06-26 DIAGNOSIS — H353211 Exudative age-related macular degeneration, right eye, with active choroidal neovascularization: Secondary | ICD-10-CM | POA: Diagnosis not present

## 2021-06-26 DIAGNOSIS — H353122 Nonexudative age-related macular degeneration, left eye, intermediate dry stage: Secondary | ICD-10-CM | POA: Diagnosis not present

## 2021-06-26 DIAGNOSIS — J3081 Allergic rhinitis due to animal (cat) (dog) hair and dander: Secondary | ICD-10-CM | POA: Diagnosis not present

## 2021-06-26 DIAGNOSIS — J301 Allergic rhinitis due to pollen: Secondary | ICD-10-CM | POA: Diagnosis not present

## 2021-06-26 NOTE — Telephone Encounter (Signed)
Next Visit: 07/03/2021   Last Visit: 01/25/2021   Last Fill: 02/28/2021   DX: Autoimmune disease    Current Dose per office note 01/25/2021: Methotrexate 8 tablets by mouth once weekly   Labs:04/26/2021 RBC 3.95, Hgb 11.7, Hct 34.8, RDW 16.2, Sodium 131, Glucose 118, Calcium 8.6   Okay to refill MTX?

## 2021-07-03 ENCOUNTER — Other Ambulatory Visit: Payer: Self-pay

## 2021-07-03 ENCOUNTER — Encounter: Payer: Self-pay | Admitting: Rheumatology

## 2021-07-03 ENCOUNTER — Ambulatory Visit: Payer: Medicare HMO | Admitting: Rheumatology

## 2021-07-03 VITALS — BP 149/90 | HR 64 | Ht 72.0 in | Wt 201.4 lb

## 2021-07-03 DIAGNOSIS — M7061 Trochanteric bursitis, right hip: Secondary | ICD-10-CM | POA: Diagnosis not present

## 2021-07-03 DIAGNOSIS — R760 Raised antibody titer: Secondary | ICD-10-CM | POA: Diagnosis not present

## 2021-07-03 DIAGNOSIS — I7300 Raynaud's syndrome without gangrene: Secondary | ICD-10-CM | POA: Diagnosis not present

## 2021-07-03 DIAGNOSIS — J3081 Allergic rhinitis due to animal (cat) (dog) hair and dander: Secondary | ICD-10-CM | POA: Diagnosis not present

## 2021-07-03 DIAGNOSIS — Z889 Allergy status to unspecified drugs, medicaments and biological substances status: Secondary | ICD-10-CM

## 2021-07-03 DIAGNOSIS — Z8669 Personal history of other diseases of the nervous system and sense organs: Secondary | ICD-10-CM

## 2021-07-03 DIAGNOSIS — H18519 Endothelial corneal dystrophy, unspecified eye: Secondary | ICD-10-CM | POA: Diagnosis not present

## 2021-07-03 DIAGNOSIS — J3089 Other allergic rhinitis: Secondary | ICD-10-CM | POA: Diagnosis not present

## 2021-07-03 DIAGNOSIS — M797 Fibromyalgia: Secondary | ICD-10-CM

## 2021-07-03 DIAGNOSIS — Z79899 Other long term (current) drug therapy: Secondary | ICD-10-CM | POA: Diagnosis not present

## 2021-07-03 DIAGNOSIS — M359 Systemic involvement of connective tissue, unspecified: Secondary | ICD-10-CM

## 2021-07-03 DIAGNOSIS — E7800 Pure hypercholesterolemia, unspecified: Secondary | ICD-10-CM

## 2021-07-03 DIAGNOSIS — Z8719 Personal history of other diseases of the digestive system: Secondary | ICD-10-CM | POA: Diagnosis not present

## 2021-07-03 DIAGNOSIS — Z862 Personal history of diseases of the blood and blood-forming organs and certain disorders involving the immune mechanism: Secondary | ICD-10-CM | POA: Diagnosis not present

## 2021-07-03 DIAGNOSIS — I10 Essential (primary) hypertension: Secondary | ICD-10-CM

## 2021-07-03 DIAGNOSIS — J301 Allergic rhinitis due to pollen: Secondary | ICD-10-CM | POA: Diagnosis not present

## 2021-07-03 NOTE — Patient Instructions (Addendum)
Standing Labs We placed an order today for your standing lab work.   Please have your standing labs drawn in February and every 3 months  If possible, please have your labs drawn 2 weeks prior to your appointment so that the provider can discuss your results at your appointment.  Please note that you may see your imaging and lab results in [place] before we have reviewed them. We may be awaiting multiple results to interpret others before contacting you. Please allow our office up to 72 hours to thoroughly review all of the results before contacting the office for clarification of your results.  We have open lab daily: Monday through Thursday from 1:30-4:30 PM and Friday from 1:30-4:00 PM at the office of Dr. Bo Merino, [facility].   Please be advised, all patients with office appointments requiring lab work will take precedent over walk-in lab work.  If possible, please come for your lab work on Monday and Friday afternoons, as you may experience shorter wait times. The office is located at [address] [place], [place], [place] [address] No appointment is necessary.   Labs are drawn by [facility]. Please bring your co-pay at the time of your lab draw.  You may receive a bill from [facility] for your lab work.  Please note if you are on Hydroxychloroquine and and an order has been placed for a Hydroxychloroquine level, you will need to have it drawn 4 hours or more after your last dose.  If you wish to have your labs drawn at another location, please call the office 24 hours in advance to send orders.  If you have any questions regarding directions or hours of operation,  please call [phone].   As a reminder, please drink plenty of water prior to coming for your lab work. Thanks!   Vaccines You are taking a medication(s) that can suppress your immune system.  The following immunizations are recommended: Flu annually Covid-19  Td/Tdap (tetanus, diphtheria,  pertussis) every 10 years Pneumonia (Prevnar 15 then Pneumovax 23 at least 1 year apart.  Alternatively, can take Prevnar 20 without needing additional dose) Shingrix: 2 doses from 4 weeks to 6 months apart  Please check with your PCP to make sure you are up to date.   If you have signs or symptoms of an infection or start antibiotics: First, call your PCP for workup of your infection. Hold your medication through the infection, until you complete your antibiotics, and until symptoms resolve if you take the following: Injectable medication (Actemra, Benlysta, Cimzia, Cosentyx, Enbrel, Humira, Kevzara, Orencia, Remicade, Simponi, Stelara, Taltz, Tremfya) Methotrexate Leflunomide (Arava) Mycophenolate (Cellcept) Roma Kayser, or Rinvoq   Iliotibial Band Syndrome Rehab Ask your health care provider which exercises are safe for you. Do exercises exactly as told by your health care provider and adjust them as directed. It is normal to feel mild stretching, pulling, tightness, or discomfort as you do these exercises. Stop right away if you feel sudden pain or your pain gets significantly worse. Do not begin these exercises until told by your health care provider. Stretching and range-of-motion exercises These exercises warm up your muscles and joints and improve the movement and flexibility of your hip and pelvis. Quadriceps stretch, prone  Lie on your abdomen (prone position) on a firm surface, such as a bed or padded floor. Bend your left / right knee and reach back to hold your ankle or pant leg. If you cannot reach your ankle or pant leg, loop  a belt around your foot and grab the belt instead. Gently pull your heel toward your buttocks. Your knee should not slide out to the side. You should feel a stretch in the front of your thigh and knee (quadriceps). Hold this position for __________ seconds. Repeat __________ times. Complete this exercise __________ times a day. Iliotibial band  stretch An iliotibial band is a strong band of muscle tissue that runs from the outer side of your hip to the outer side of your thigh and knee. Lie on your side with your left / right leg in the top position. Bend both of your knees and grab your left / right ankle. Stretch out your bottom arm to help you balance. Slowly bring your top knee back so your thigh goes behind your trunk. Slowly lower your top leg toward the floor until you feel a gentle stretch on the outside of your left / right hip and thigh. If you do not feel a stretch and your knee will not fall farther, place the heel of your other foot on top of your knee and pull your knee down toward the floor with your foot. Hold this position for __________ seconds. Repeat __________ times. Complete this exercise __________ times a day. Strengthening exercises These exercises build strength and endurance in your hip and pelvis. Endurance is the ability to use your muscles for a long time, even after they get tired. Straight leg raises, side-lying This exercise strengthens the muscles that rotate the leg at the hip and move it away from your body (hip abductors). Lie on your side with your left / right leg in the top position. Lie so your head, shoulder, hip, and knee line up. You may bend your bottom knee to help you balance. Roll your hips slightly forward so your hips are stacked directly over each other and your left / right knee is facing forward. Tense the muscles in your outer thigh and lift your top leg 4-6 inches (10-15 cm). Hold this position for __________ seconds. Slowly lower your leg to return to the starting position. Let your muscles relax completely before doing another repetition. Repeat __________ times. Complete this exercise __________ times a day. Leg raises, prone This exercise strengthens the muscles that move the hips backward (hip extensors). Lie on your abdomen (prone position) on your bed or a firm surface. You  can put a pillow under your hips if that is more comfortable for your lower back. Bend your left / right knee so your foot is straight up in the air. Squeeze your buttocks muscles and lift your left / right thigh off the bed. Do not let your back arch. Tense your thigh muscle as hard as you can without increasing any knee pain. Hold this position for __________ seconds. Slowly lower your leg to return to the starting position and allow it to relax completely. Repeat __________ times. Complete this exercise __________ times a day. Hip hike Stand sideways on a bottom step. Stand on your left / right leg with your other foot unsupported next to the step. You can hold on to a railing or wall for balance if needed. Keep your knees straight and your torso square. Then lift your left / right hip up toward the ceiling. Slowly let your left / right hip lower toward the floor, past the starting position. Your foot should get closer to the floor. Do not lean or bend your knees. Repeat __________ times. Complete this exercise __________ times a day.  This information is not intended to replace advice given to you by your health care provider. Make sure you discuss any questions you have with your health care provider. Document Revised: 08/12/2019 Document Reviewed: 08/12/2019 Elsevier Patient Education  [place].

## 2021-07-10 DIAGNOSIS — J3089 Other allergic rhinitis: Secondary | ICD-10-CM | POA: Diagnosis not present

## 2021-07-10 DIAGNOSIS — J301 Allergic rhinitis due to pollen: Secondary | ICD-10-CM | POA: Diagnosis not present

## 2021-07-10 DIAGNOSIS — J3081 Allergic rhinitis due to animal (cat) (dog) hair and dander: Secondary | ICD-10-CM | POA: Diagnosis not present

## 2021-07-12 DIAGNOSIS — J3089 Other allergic rhinitis: Secondary | ICD-10-CM | POA: Diagnosis not present

## 2021-07-12 DIAGNOSIS — J3081 Allergic rhinitis due to animal (cat) (dog) hair and dander: Secondary | ICD-10-CM | POA: Diagnosis not present

## 2021-07-17 DIAGNOSIS — J3081 Allergic rhinitis due to animal (cat) (dog) hair and dander: Secondary | ICD-10-CM | POA: Diagnosis not present

## 2021-07-17 DIAGNOSIS — J3089 Other allergic rhinitis: Secondary | ICD-10-CM | POA: Diagnosis not present

## 2021-07-17 DIAGNOSIS — J301 Allergic rhinitis due to pollen: Secondary | ICD-10-CM | POA: Diagnosis not present

## 2021-07-24 DIAGNOSIS — H353122 Nonexudative age-related macular degeneration, left eye, intermediate dry stage: Secondary | ICD-10-CM | POA: Diagnosis not present

## 2021-07-24 DIAGNOSIS — J3089 Other allergic rhinitis: Secondary | ICD-10-CM | POA: Diagnosis not present

## 2021-07-24 DIAGNOSIS — H26493 Other secondary cataract, bilateral: Secondary | ICD-10-CM | POA: Diagnosis not present

## 2021-07-24 DIAGNOSIS — H43813 Vitreous degeneration, bilateral: Secondary | ICD-10-CM | POA: Diagnosis not present

## 2021-07-24 DIAGNOSIS — J301 Allergic rhinitis due to pollen: Secondary | ICD-10-CM | POA: Diagnosis not present

## 2021-07-24 DIAGNOSIS — J3081 Allergic rhinitis due to animal (cat) (dog) hair and dander: Secondary | ICD-10-CM | POA: Diagnosis not present

## 2021-07-24 DIAGNOSIS — H353211 Exudative age-related macular degeneration, right eye, with active choroidal neovascularization: Secondary | ICD-10-CM | POA: Diagnosis not present

## 2021-07-25 ENCOUNTER — Encounter: Payer: Self-pay | Admitting: Cardiology

## 2021-07-25 ENCOUNTER — Ambulatory Visit: Payer: Medicare HMO | Admitting: Cardiology

## 2021-07-25 ENCOUNTER — Encounter: Payer: Self-pay | Admitting: *Deleted

## 2021-07-25 ENCOUNTER — Other Ambulatory Visit: Payer: Self-pay

## 2021-07-25 VITALS — BP 118/78 | HR 48 | Ht 72.0 in | Wt 199.6 lb

## 2021-07-25 DIAGNOSIS — R002 Palpitations: Secondary | ICD-10-CM

## 2021-07-25 DIAGNOSIS — R0789 Other chest pain: Secondary | ICD-10-CM

## 2021-07-25 MED ORDER — ATENOLOL 25 MG PO TABS: 12.5000 mg | ORAL_TABLET | Freq: Every day | ORAL | 3 refills | Status: DC

## 2021-07-25 MED ORDER — ASPIRIN 81 MG PO TBEC: 81.0000 mg | DELAYED_RELEASE_TABLET | Freq: Every day | ORAL | Status: DC

## 2021-07-25 NOTE — Patient Instructions (Signed)
Medication Instructions:  Decrease Atenolol to 12.5mg  daily Decrease Aspirin to 81mg  daily  Continue all other medications.     Labwork: none  Testing/Procedures: none  Follow-Up: Your physician wants you to follow up in:  1 year.  You will receive a reminder letter in the mail one-two months in advance.  If you don't receive a letter, please call our office to schedule the follow up appointment    Any Other Special Instructions Will Be Listed Below (If Applicable).   If you need a refill on your cardiac medications before your next appointment, please call your pharmacy.

## 2021-07-25 NOTE — Progress Notes (Signed)
Clinical Summary [name] is a 76 y.o.male seen today for follow up of the following medical problems.    1. Palpitations - prior symptoms seemed to be related to severe allergies, resolved with resolution of his allergy symptoms.     -no recent palpitations - compliant with meds    2. Chest pain - chest tightness episode about 3 weeks ago. Chest tightness midchest, felt heavy. Felt lightheaded, confused. No palpitations at the time. Tightness lasted about 20 minutes. Went away on its own.  - has had other episodes of tightness since that time.  - denies any DOE. Sedentary lifestyle. - history of fibromyalgia per his report.   CAD risk factors: HTN, mother MI at 92  12/2016 echo: LVEF 60-65%, abnormal diastoilc function,      - no recent symptoms.       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        4. HTN -he is compliant with meds     SH: plays a bassoon   Past Medical History:  Diagnosis Date   Diverticula of colon    2016 colonoscopy   Fibromyalgia    Fibromyalgia    History of GI diverticular bleed    Hypercholesteremia    Hypertension      Allergies  Allergen Reactions   Plaquenil [Hydroxychloroquine Sulfate]     rash   Lodine [Etodolac] Swelling and Rash     Current Outpatient Medications  Medication Sig Dispense Refill   albuterol (VENTOLIN HFA) 108 (90 Base) MCG/ACT inhaler      amLODipine (NORVASC) 5 MG tablet Take 5 mg by mouth daily.     aspirin 325 MG EC tablet Take 325 mg by mouth daily.     atenolol (TENORMIN) 25 MG tablet TAKE (1) TABLET BY MOUTH ONCE DAILY. 90 tablet 3   azelastine (ASTELIN) 0.1 % nasal spray Place 1 spray into both nostrils 2 (two) times daily.     Cholecalciferol (VITAMIN D) 50 MCG (2000 UT) CAPS Take 2,000 Units by mouth daily.     clobetasol (TEMOVATE) 0.05 % external solution Apply 1 application topically 2 (two) times daily. (Patient taking differently:  Apply 1 application topically as needed.) 50 mL 0   dicyclomine (BENTYL) 10 MG capsule Take 1 capsule (10 mg total) by mouth daily before breakfast. 30 capsule 1   diphenhydrAMINE (BENADRYL) 25 MG tablet Take 25 mg by mouth as needed for allergies.      Doxepin HCl (ZONALON) 5 % CREA Apply 1 application topically daily. 45 g 2   EPINEPHrine 0.3 mg/0.3 mL IJ SOAJ injection INJECT INTO THIGH ASONEEDED FOR ALLERGIC REACTION.     famotidine (PEPCID) 20 MG tablet Take 20 mg by mouth daily.     FLUoxetine (PROZAC) 10 MG tablet Take 10 mg by mouth daily.     fluticasone (FLONASE) 50 MCG/ACT nasal spray SMARTSIG:1-2 Spray(s) Both Nares Daily     folic acid (FOLVITE) 1 MG tablet TAKE 2 TABLETS BY MOUTH DAILY. 180 tablet 0   hydrocortisone (ANUSOL-HC) 2.5 % rectal cream Place 1 application rectally 2 (two) times daily. 30 g 1   hydrOXYzine (ATARAX/VISTARIL) 25 MG tablet Take 25 mg by mouth every 8 (eight) hours as needed.     Investigational - Study Medication Study name: atorvastatin 40mg  or placebo.     levocetirizine (XYZAL) 5 MG tablet Take 5 mg by mouth every evening.      methotrexate 2.5 MG  tablet TAKE 8 TABLETS BY MOUTH ONCE WEEKLY. 96 tablet 0   montelukast (SINGULAIR) 10 MG tablet Take 10 mg by mouth at bedtime.     Multiple Vitamins-Minerals (ICAPS AREDS 2 PO) Take by mouth.     naproxen sodium (ALEVE) 220 MG tablet Take 220 mg by mouth daily as needed.     Omega-3 Fatty Acids (FISH OIL) 1000 MG CAPS Take 1 capsule by mouth 2 (two) times daily.     Testosterone (ANDROGEL TD) Place onto the skin daily.     triazolam (HALCION) 0.25 MG tablet Take 0.25 mg by mouth at bedtime.      No current facility-administered medications for this visit.     Past Surgical History:  Procedure Laterality Date   allergy shots  weekly   CHOLECYSTECTOMY     COLONOSCOPY     Dr.Rehman   COLONOSCOPY N/A 01/27/2015   Rehman: multiple diverticula at sigmoid colon,ext hemorrhoids   EYE SURGERY Bilateral     partial cornea transplants    GALLBLADDER SURGERY  12/1993   UPPER GASTROINTESTINAL ENDOSCOPY       Allergies  Allergen Reactions   Plaquenil [Hydroxychloroquine Sulfate]     rash   Lodine [Etodolac] Swelling and Rash      Family History  Problem Relation Age of Onset   Heart disease Mother    Pancreatic cancer Mother    Prostate cancer Father    Healthy Sister    Parkinson's disease Brother    Asthma Sister    Healthy Sister      Social History [name] reports that he has never smoked. He has never used smokeless tobacco. [name] reports current alcohol use.   Review of Systems CONSTITUTIONAL: No weight loss, fever, chills, weakness or fatigue.  HEENT: Eyes: No visual loss, blurred vision, double vision or yellow sclerae.No hearing loss, sneezing, congestion, runny nose or sore throat.  SKIN: No rash or itching.  CARDIOVASCULAR: per hpi RESPIRATORY: No shortness of breath, cough or sputum.  GASTROINTESTINAL: No anorexia, nausea, vomiting or diarrhea. No abdominal pain or blood.  GENITOURINARY: No burning on urination, no polyuria NEUROLOGICAL: No headache, dizziness, syncope, paralysis, ataxia, numbness or tingling in the extremities. No change in bowel or bladder control.  MUSCULOSKELETAL: No muscle, back pain, joint pain or stiffness.  LYMPHATICS: No enlarged nodes. No history of splenectomy.  PSYCHIATRIC: No history of depression or anxiety.  ENDOCRINOLOGIC: No reports of sweating, cold or heat intolerance. No polyuria or polydipsia.  Marland Kitchen   Physical Examination Today's Vitals   07/25/21 1257  BP: 118/78  Pulse: (!) 48  SpO2: 99%  Weight: 199 lb 9.6 oz (90.5 kg)  Height: 6' (1.829 m)   Body mass index is 27.07 kg/m.  Gen: resting comfortably, no acute distress HEENT: no scleral icterus, pupils equal round and reactive, no palptable cervical adenopathy,  CV: regular, brady, no m/r/g, no jvd Resp: Clear to auscultation bilaterally GI: abdomen is  soft, non-tender, non-distended, normal bowel sounds, no hepatosplenomegaly MSK: extremities are warm, no edema.  Skin: warm, no rash Neuro:  no focal deficits Psych: appropriate affect   Diagnostic Studies  12/2016 echo Study Conclusions   - Left ventricle: The cavity size was normal. Systolic function was   normal. The estimated ejection fraction was in the range of 60%   to 65%. There was an increased relative contribution of atrial   contraction to ventricular filling. Mild focal basal septal   hypertrophy. - Aorta: Very mild ascending aortic dilatation.  Maximal diameter   3.8 cm. - Mitral valve: There was trivial regurgitation. - Tricuspid valve: There was mild regurgitation. - Pulmonary arteries: PA peak pressure: 36 mm Hg (S). - Systemic veins: IVC is dilated with normal respiratory variation.   Estimated RAP 8 mmHg.   Assessment and Plan  1. Palpitations/Chest pain -prior symptoms seemed to correlate with severe allergies at the time, no recurrence - low HR's today, we will lower atenolol to 12.5mg  daily.   EKG shows sinus brady 48   2. HTN - at goal. With low HRs lower atenolol to 12.5mg  daily   F/u 1 year.       Arnoldo Lenis, M.D.

## 2021-07-27 DIAGNOSIS — E663 Overweight: Secondary | ICD-10-CM | POA: Diagnosis not present

## 2021-07-27 DIAGNOSIS — K120 Recurrent oral aphthae: Secondary | ICD-10-CM | POA: Diagnosis not present

## 2021-07-27 DIAGNOSIS — G4709 Other insomnia: Secondary | ICD-10-CM | POA: Diagnosis not present

## 2021-07-27 DIAGNOSIS — Z6827 Body mass index (BMI) 27.0-27.9, adult: Secondary | ICD-10-CM | POA: Diagnosis not present

## 2021-07-27 DIAGNOSIS — R001 Bradycardia, unspecified: Secondary | ICD-10-CM | POA: Diagnosis not present

## 2021-07-27 DIAGNOSIS — I10 Essential (primary) hypertension: Secondary | ICD-10-CM | POA: Diagnosis not present

## 2021-07-31 DIAGNOSIS — J301 Allergic rhinitis due to pollen: Secondary | ICD-10-CM | POA: Diagnosis not present

## 2021-07-31 DIAGNOSIS — J3089 Other allergic rhinitis: Secondary | ICD-10-CM | POA: Diagnosis not present

## 2021-07-31 DIAGNOSIS — J3081 Allergic rhinitis due to animal (cat) (dog) hair and dander: Secondary | ICD-10-CM | POA: Diagnosis not present

## 2021-08-07 DIAGNOSIS — J3081 Allergic rhinitis due to animal (cat) (dog) hair and dander: Secondary | ICD-10-CM | POA: Diagnosis not present

## 2021-08-07 DIAGNOSIS — J301 Allergic rhinitis due to pollen: Secondary | ICD-10-CM | POA: Diagnosis not present

## 2021-08-07 DIAGNOSIS — J3089 Other allergic rhinitis: Secondary | ICD-10-CM | POA: Diagnosis not present

## 2021-08-10 DIAGNOSIS — I10 Essential (primary) hypertension: Secondary | ICD-10-CM | POA: Diagnosis not present

## 2021-08-10 DIAGNOSIS — Z0001 Encounter for general adult medical examination with abnormal findings: Secondary | ICD-10-CM | POA: Diagnosis not present

## 2021-08-10 DIAGNOSIS — J309 Allergic rhinitis, unspecified: Secondary | ICD-10-CM | POA: Diagnosis not present

## 2021-08-10 DIAGNOSIS — E291 Testicular hypofunction: Secondary | ICD-10-CM | POA: Diagnosis not present

## 2021-08-10 DIAGNOSIS — Z1331 Encounter for screening for depression: Secondary | ICD-10-CM | POA: Diagnosis not present

## 2021-08-10 DIAGNOSIS — Z6827 Body mass index (BMI) 27.0-27.9, adult: Secondary | ICD-10-CM | POA: Diagnosis not present

## 2021-08-10 DIAGNOSIS — R001 Bradycardia, unspecified: Secondary | ICD-10-CM | POA: Diagnosis not present

## 2021-08-10 DIAGNOSIS — E782 Mixed hyperlipidemia: Secondary | ICD-10-CM | POA: Diagnosis not present

## 2021-08-10 DIAGNOSIS — G4709 Other insomnia: Secondary | ICD-10-CM | POA: Diagnosis not present

## 2021-08-14 DIAGNOSIS — J3089 Other allergic rhinitis: Secondary | ICD-10-CM | POA: Diagnosis not present

## 2021-08-14 DIAGNOSIS — J301 Allergic rhinitis due to pollen: Secondary | ICD-10-CM | POA: Diagnosis not present

## 2021-08-14 DIAGNOSIS — J3081 Allergic rhinitis due to animal (cat) (dog) hair and dander: Secondary | ICD-10-CM | POA: Diagnosis not present

## 2021-08-22 DIAGNOSIS — J3081 Allergic rhinitis due to animal (cat) (dog) hair and dander: Secondary | ICD-10-CM | POA: Diagnosis not present

## 2021-08-22 DIAGNOSIS — J301 Allergic rhinitis due to pollen: Secondary | ICD-10-CM | POA: Diagnosis not present

## 2021-08-22 DIAGNOSIS — J3089 Other allergic rhinitis: Secondary | ICD-10-CM | POA: Diagnosis not present

## 2021-08-22 DIAGNOSIS — H353122 Nonexudative age-related macular degeneration, left eye, intermediate dry stage: Secondary | ICD-10-CM | POA: Diagnosis not present

## 2021-08-22 DIAGNOSIS — H353211 Exudative age-related macular degeneration, right eye, with active choroidal neovascularization: Secondary | ICD-10-CM | POA: Diagnosis not present

## 2021-08-29 DIAGNOSIS — J3089 Other allergic rhinitis: Secondary | ICD-10-CM | POA: Diagnosis not present

## 2021-08-29 DIAGNOSIS — J3081 Allergic rhinitis due to animal (cat) (dog) hair and dander: Secondary | ICD-10-CM | POA: Diagnosis not present

## 2021-08-29 DIAGNOSIS — J301 Allergic rhinitis due to pollen: Secondary | ICD-10-CM | POA: Diagnosis not present

## 2021-09-05 DIAGNOSIS — J301 Allergic rhinitis due to pollen: Secondary | ICD-10-CM | POA: Diagnosis not present

## 2021-09-05 DIAGNOSIS — J3089 Other allergic rhinitis: Secondary | ICD-10-CM | POA: Diagnosis not present

## 2021-09-05 DIAGNOSIS — J3081 Allergic rhinitis due to animal (cat) (dog) hair and dander: Secondary | ICD-10-CM | POA: Diagnosis not present

## 2021-09-11 DIAGNOSIS — J3089 Other allergic rhinitis: Secondary | ICD-10-CM | POA: Diagnosis not present

## 2021-09-11 DIAGNOSIS — J3081 Allergic rhinitis due to animal (cat) (dog) hair and dander: Secondary | ICD-10-CM | POA: Diagnosis not present

## 2021-09-11 DIAGNOSIS — J301 Allergic rhinitis due to pollen: Secondary | ICD-10-CM | POA: Diagnosis not present

## 2021-09-19 DIAGNOSIS — J301 Allergic rhinitis due to pollen: Secondary | ICD-10-CM | POA: Diagnosis not present

## 2021-09-19 DIAGNOSIS — J3081 Allergic rhinitis due to animal (cat) (dog) hair and dander: Secondary | ICD-10-CM | POA: Diagnosis not present

## 2021-09-19 DIAGNOSIS — J3089 Other allergic rhinitis: Secondary | ICD-10-CM | POA: Diagnosis not present

## 2021-09-19 DIAGNOSIS — H353211 Exudative age-related macular degeneration, right eye, with active choroidal neovascularization: Secondary | ICD-10-CM | POA: Diagnosis not present

## 2021-09-25 ENCOUNTER — Other Ambulatory Visit: Payer: Self-pay | Admitting: Physician Assistant

## 2021-09-25 DIAGNOSIS — J3089 Other allergic rhinitis: Secondary | ICD-10-CM | POA: Diagnosis not present

## 2021-09-25 DIAGNOSIS — J3081 Allergic rhinitis due to animal (cat) (dog) hair and dander: Secondary | ICD-10-CM | POA: Diagnosis not present

## 2021-09-25 DIAGNOSIS — M359 Systemic involvement of connective tissue, unspecified: Secondary | ICD-10-CM

## 2021-09-25 DIAGNOSIS — J301 Allergic rhinitis due to pollen: Secondary | ICD-10-CM | POA: Diagnosis not present

## 2021-09-25 NOTE — Telephone Encounter (Signed)
Next Visit: 12/04/2021 ? ?Last Visit: 07/03/2021 ? ?Last Fill: 06/26/2021 ? ?DX: Autoimmune disease ? ?Current Dose per office note on 07/03/2021: Methotrexate 8 tablets by mouth once weekly ? ?Labs: 04/26/2021 sodium 131, glucose 118, calcium 8.6, RBC 3.95, hemoglobin 11.7, HCT 34.8, RDW 16.2 ? ?Attempted to contact patient and left message on machine to advise patient he is due for labs. Lab orders are in place.  ? ?Okay to refill 30 day supply of methotrexate?  ?

## 2021-10-04 ENCOUNTER — Ambulatory Visit: Payer: Medicare HMO | Admitting: Dermatology

## 2021-10-04 ENCOUNTER — Encounter: Payer: Self-pay | Admitting: Dermatology

## 2021-10-04 DIAGNOSIS — J301 Allergic rhinitis due to pollen: Secondary | ICD-10-CM | POA: Diagnosis not present

## 2021-10-04 DIAGNOSIS — J3089 Other allergic rhinitis: Secondary | ICD-10-CM | POA: Diagnosis not present

## 2021-10-04 DIAGNOSIS — Z1283 Encounter for screening for malignant neoplasm of skin: Secondary | ICD-10-CM | POA: Diagnosis not present

## 2021-10-04 DIAGNOSIS — L739 Follicular disorder, unspecified: Secondary | ICD-10-CM

## 2021-10-04 DIAGNOSIS — L219 Seborrheic dermatitis, unspecified: Secondary | ICD-10-CM

## 2021-10-04 DIAGNOSIS — J3081 Allergic rhinitis due to animal (cat) (dog) hair and dander: Secondary | ICD-10-CM | POA: Diagnosis not present

## 2021-10-04 MED ORDER — CLOBETASOL PROPIONATE 0.05 % EX FOAM: Freq: Two times a day (BID) | CUTANEOUS | 0 refills | Status: DC

## 2021-10-09 ENCOUNTER — Other Ambulatory Visit: Payer: Self-pay | Admitting: *Deleted

## 2021-10-09 ENCOUNTER — Other Ambulatory Visit: Payer: Self-pay | Admitting: Physician Assistant

## 2021-10-09 DIAGNOSIS — Z79899 Other long term (current) drug therapy: Secondary | ICD-10-CM | POA: Diagnosis not present

## 2021-10-09 DIAGNOSIS — M359 Systemic involvement of connective tissue, unspecified: Secondary | ICD-10-CM | POA: Diagnosis not present

## 2021-10-09 DIAGNOSIS — R760 Raised antibody titer: Secondary | ICD-10-CM | POA: Diagnosis not present

## 2021-10-09 NOTE — Telephone Encounter (Signed)
Patient is overdue to update lab work.  He will need updated lab work drawn prior to sending refill.  ?30 day supply was provided on 09/25/21 and the patient still has not updated lab work.

## 2021-10-09 NOTE — Telephone Encounter (Signed)
Next Visit: 12/04/2021 ? ?Last Visit: 07/03/2021 ? ?Last Fill: 09/25/2020 (30 day supply) ? ?DX: Autoimmune disease  ? ?Current Dose per office note 07/03/2021: Methotrexate 8 tablets by mouth once weekly ? ?Labs: 04/26/2021 Glucose 118, Sodium 131, Calcium 8.6, RBC 3.95, Hgb 11.7, Hct 34.8, RDW 16.2 ? ?Left message to advise patient  ? ?Okay to refill MTX?  ?

## 2021-10-11 DIAGNOSIS — J3089 Other allergic rhinitis: Secondary | ICD-10-CM | POA: Diagnosis not present

## 2021-10-11 DIAGNOSIS — J3081 Allergic rhinitis due to animal (cat) (dog) hair and dander: Secondary | ICD-10-CM | POA: Diagnosis not present

## 2021-10-11 DIAGNOSIS — J301 Allergic rhinitis due to pollen: Secondary | ICD-10-CM | POA: Diagnosis not present

## 2021-10-11 NOTE — Progress Notes (Signed)
Beta-2 GP 1 IgG is low titer positive, anticardiolipin IgM is positive.  All other labs were within normal limits.  Labs do not indicate a disease flare.  No change in treatment advised.

## 2021-10-12 LAB — CBC WITH DIFFERENTIAL/PLATELET
Basophils Absolute: 0.1 10*3/uL (ref 0.0–0.2)
Basos: 1 %
EOS (ABSOLUTE): 0.3 10*3/uL (ref 0.0–0.4)
Eos: 4 %
Hematocrit: 36.5 % — ABNORMAL LOW (ref 37.5–51.0)
Hemoglobin: 12 g/dL — ABNORMAL LOW (ref 13.0–17.7)
Immature Grans (Abs): 0 10*3/uL (ref 0.0–0.1)
Immature Granulocytes: 0 %
Lymphocytes Absolute: 1.4 10*3/uL (ref 0.7–3.1)
Lymphs: 20 %
MCH: 28.8 pg (ref 26.6–33.0)
MCHC: 32.9 g/dL (ref 31.5–35.7)
MCV: 88 fL (ref 79–97)
Monocytes Absolute: 0.6 10*3/uL (ref 0.1–0.9)
Monocytes: 9 %
Neutrophils Absolute: 4.7 10*3/uL (ref 1.4–7.0)
Neutrophils: 66 %
Platelets: 229 10*3/uL (ref 150–450)
RBC: 4.17 x10E6/uL (ref 4.14–5.80)
RDW: 16.4 % — ABNORMAL HIGH (ref 11.6–15.4)
WBC: 7.1 10*3/uL (ref 3.4–10.8)

## 2021-10-12 LAB — CMP14+EGFR
ALT: 12 IU/L (ref 0–44)
AST: 15 IU/L (ref 0–40)
Albumin/Globulin Ratio: 1.6 (ref 1.2–2.2)
Albumin: 4.1 g/dL (ref 3.7–4.7)
Alkaline Phosphatase: 138 IU/L — ABNORMAL HIGH (ref 44–121)
BUN/Creatinine Ratio: 17 (ref 10–24)
BUN: 18 mg/dL (ref 8–27)
Bilirubin Total: 0.4 mg/dL (ref 0.0–1.2)
CO2: 23 mmol/L (ref 20–29)
Calcium: 9.1 mg/dL (ref 8.6–10.2)
Chloride: 101 mmol/L (ref 96–106)
Creatinine, Ser: 1.03 mg/dL (ref 0.76–1.27)
Globulin, Total: 2.5 g/dL (ref 1.5–4.5)
Glucose: 92 mg/dL (ref 70–99)
Potassium: 4.1 mmol/L (ref 3.5–5.2)
Sodium: 137 mmol/L (ref 134–144)
Total Protein: 6.6 g/dL (ref 6.0–8.5)
eGFR: 75 mL/min/{1.73_m2} (ref >59)

## 2021-10-12 LAB — URINALYSIS, ROUTINE W REFLEX MICROSCOPIC
Bilirubin, UA: NEGATIVE
Glucose, UA: NEGATIVE
Ketones, UA: NEGATIVE
Leukocytes,UA: NEGATIVE
Nitrite, UA: NEGATIVE
Protein,UA: NEGATIVE
RBC, UA: NEGATIVE
Specific Gravity, UA: 1.01 (ref 1.005–1.030)
Urobilinogen, Ur: 0.2 mg/dL (ref 0.2–1.0)
pH, UA: 7.5 (ref 5.0–7.5)

## 2021-10-12 LAB — C3 AND C4
Complement C3, Serum: 116 mg/dL (ref 82–167)
Complement C4, Serum: 20 mg/dL (ref 12–38)

## 2021-10-12 LAB — CARDIOLIPIN ANTIBODIES, IGG, IGM, IGA
Anticardiolipin IgA: <9 APL U/mL (ref 0–11)
Anticardiolipin IgG: 12 GPL U/mL (ref 0–14)
Anticardiolipin IgM: 51 MPL U/mL — ABNORMAL HIGH (ref 0–12)

## 2021-10-12 LAB — SEDIMENTATION RATE: Sed Rate: 28 mm/hr (ref 0–30)

## 2021-10-12 LAB — BETA-2-GLYCOPROTEIN I ABS, IGG/M/A
Beta-2 Glyco 1 IgA: 16 GPI IgA units (ref 0–25)
Beta-2 Glyco 1 IgM: >150 GPI IgM units — ABNORMAL HIGH (ref 0–32)
Beta-2 Glyco I IgG: 22 GPI IgG units — ABNORMAL HIGH (ref 0–20)

## 2021-10-12 LAB — ANTI-DNA ANTIBODY, DOUBLE-STRANDED: dsDNA Ab: <1 IU/mL (ref 0–9)

## 2021-10-16 DIAGNOSIS — J301 Allergic rhinitis due to pollen: Secondary | ICD-10-CM | POA: Diagnosis not present

## 2021-10-16 DIAGNOSIS — J3081 Allergic rhinitis due to animal (cat) (dog) hair and dander: Secondary | ICD-10-CM | POA: Diagnosis not present

## 2021-10-16 DIAGNOSIS — J3089 Other allergic rhinitis: Secondary | ICD-10-CM | POA: Diagnosis not present

## 2021-10-22 ENCOUNTER — Encounter: Payer: Self-pay | Admitting: Dermatology

## 2021-10-22 NOTE — Progress Notes (Signed)
? ?  Follow-Up Visit ?  ?Subjective  ?Cole Brown is a 76 y.o. male who presents for the following: Annual Exam (Little irritation on face- tx- otc hydrocortisone ). ? ?Annual skin check, some breaking out on face ?Location:  ?Duration:  ?Quality:  ?Associated Signs/Symptoms: ?Modifying Factors:  ?Severity:  ?Timing: ?Context:  ? ?Objective  ?Well appearing patient in no apparent distress; mood and affect are within normal limits. ?Waist up skin examination: No atypical pigmented lesions (all checked with dermoscopy), no nonmelanoma skin cancer. ? ?Mid Parietal Scalp ?Several areas with active folliculitis ? ?Head ?History of facial irritation for seborrheic dermatitis bilateral objective inflammation today moderate sun damage ? ? ? ?All skin waist up examined. ? ? ?Assessment & Plan  ? ? ?Encounter for screening for malignant neoplasm of skin ? ?Folliculitis ?Mid Parietal Scalp ? ?Topical clobetasol foam daily to areas prone to get these sores for 2 to 4 weeks.  Taper if improves. ? ?clobetasol (OLUX) 0.05 % topical foam - Mid Parietal Scalp ?Apply topically 2 (two) times daily. ? ?Seborrheic dermatitis ?Head ? ?If flares he may try over-the-counter 1% hydrocortisone nightly for 2 to 3 weeks. ? ? ? ? ? ?I, Lavonna Monarch, MD, have reviewed all documentation for this visit.  The documentation on 10/22/21 for the exam, diagnosis, procedures, and orders are all accurate and complete. ?

## 2021-10-23 ENCOUNTER — Other Ambulatory Visit: Payer: Self-pay | Admitting: Physician Assistant

## 2021-10-23 DIAGNOSIS — M359 Systemic involvement of connective tissue, unspecified: Secondary | ICD-10-CM

## 2021-10-23 NOTE — Telephone Encounter (Signed)
Next Visit: 12/04/2021 ?  ?Last Visit: 07/03/2021 ?  ?Last Fill:06/05/2021 ? ?DX: Autoimmune disease  ?  ?Current Dose per office note 5/00/1642:  folic acid 2 mg by mouth daily ? ?Okay to refill Folic Acid?  ?

## 2021-10-24 DIAGNOSIS — J301 Allergic rhinitis due to pollen: Secondary | ICD-10-CM | POA: Diagnosis not present

## 2021-10-24 DIAGNOSIS — H353122 Nonexudative age-related macular degeneration, left eye, intermediate dry stage: Secondary | ICD-10-CM | POA: Diagnosis not present

## 2021-10-24 DIAGNOSIS — H43813 Vitreous degeneration, bilateral: Secondary | ICD-10-CM | POA: Diagnosis not present

## 2021-10-24 DIAGNOSIS — J3089 Other allergic rhinitis: Secondary | ICD-10-CM | POA: Diagnosis not present

## 2021-10-24 DIAGNOSIS — J3081 Allergic rhinitis due to animal (cat) (dog) hair and dander: Secondary | ICD-10-CM | POA: Diagnosis not present

## 2021-10-24 DIAGNOSIS — H26493 Other secondary cataract, bilateral: Secondary | ICD-10-CM | POA: Diagnosis not present

## 2021-10-24 DIAGNOSIS — H353211 Exudative age-related macular degeneration, right eye, with active choroidal neovascularization: Secondary | ICD-10-CM | POA: Diagnosis not present

## 2021-10-31 DIAGNOSIS — J3081 Allergic rhinitis due to animal (cat) (dog) hair and dander: Secondary | ICD-10-CM | POA: Diagnosis not present

## 2021-10-31 DIAGNOSIS — J301 Allergic rhinitis due to pollen: Secondary | ICD-10-CM | POA: Diagnosis not present

## 2021-10-31 DIAGNOSIS — J3089 Other allergic rhinitis: Secondary | ICD-10-CM | POA: Diagnosis not present

## 2021-11-02 DIAGNOSIS — D649 Anemia, unspecified: Secondary | ICD-10-CM | POA: Diagnosis not present

## 2021-11-07 DIAGNOSIS — J3089 Other allergic rhinitis: Secondary | ICD-10-CM | POA: Diagnosis not present

## 2021-11-07 DIAGNOSIS — J301 Allergic rhinitis due to pollen: Secondary | ICD-10-CM | POA: Diagnosis not present

## 2021-11-07 DIAGNOSIS — J3081 Allergic rhinitis due to animal (cat) (dog) hair and dander: Secondary | ICD-10-CM | POA: Diagnosis not present

## 2021-11-14 DIAGNOSIS — J301 Allergic rhinitis due to pollen: Secondary | ICD-10-CM | POA: Diagnosis not present

## 2021-11-14 DIAGNOSIS — J3081 Allergic rhinitis due to animal (cat) (dog) hair and dander: Secondary | ICD-10-CM | POA: Diagnosis not present

## 2021-11-14 DIAGNOSIS — J3089 Other allergic rhinitis: Secondary | ICD-10-CM | POA: Diagnosis not present

## 2021-11-21 DIAGNOSIS — J301 Allergic rhinitis due to pollen: Secondary | ICD-10-CM | POA: Diagnosis not present

## 2021-11-21 DIAGNOSIS — J3081 Allergic rhinitis due to animal (cat) (dog) hair and dander: Secondary | ICD-10-CM | POA: Diagnosis not present

## 2021-11-21 DIAGNOSIS — H353211 Exudative age-related macular degeneration, right eye, with active choroidal neovascularization: Secondary | ICD-10-CM | POA: Diagnosis not present

## 2021-11-21 DIAGNOSIS — H353122 Nonexudative age-related macular degeneration, left eye, intermediate dry stage: Secondary | ICD-10-CM | POA: Diagnosis not present

## 2021-11-21 DIAGNOSIS — J3089 Other allergic rhinitis: Secondary | ICD-10-CM | POA: Diagnosis not present

## 2021-11-22 DIAGNOSIS — E291 Testicular hypofunction: Secondary | ICD-10-CM | POA: Diagnosis not present

## 2021-11-22 DIAGNOSIS — K219 Gastro-esophageal reflux disease without esophagitis: Secondary | ICD-10-CM | POA: Diagnosis not present

## 2021-11-22 DIAGNOSIS — Z6828 Body mass index (BMI) 28.0-28.9, adult: Secondary | ICD-10-CM | POA: Diagnosis not present

## 2021-11-22 DIAGNOSIS — E782 Mixed hyperlipidemia: Secondary | ICD-10-CM | POA: Diagnosis not present

## 2021-11-22 DIAGNOSIS — E663 Overweight: Secondary | ICD-10-CM | POA: Diagnosis not present

## 2021-11-22 DIAGNOSIS — I10 Essential (primary) hypertension: Secondary | ICD-10-CM | POA: Diagnosis not present

## 2021-11-22 NOTE — Progress Notes (Signed)
Office Visit Note  Patient: Cole Brown             Date of Birth: [birth year]           MRN: [id number]             PCP: Redmond School, MD Referring: Redmond School, MD Visit Date: 12/04/2021 Occupation: @GUAROCC @  Subjective:  Discuss lab works   History of Present Illness: BROWNIE NEHME is a 76 y.o. male with history of autoimmune disease and fibromyalgia.  He is taking Methotrexate 8 tablets by mouth once weekly and folic acid 2 mg by mouth daily.  He continues to tolerate methotrexate without any side effects and has not missed any doses recently.  He denies any recent infections.  He has not had any signs or symptoms of a flare.  He remains on aspirin 81 mg daily.  He denies any signs or symptoms of a blood clot since prior to his last office visit.  He has occasional arthralgias and joint stiffness but overall his symptoms have been tolerable taking methotrexate as prescribed.  He denies any joint swelling at this time.  He has not had any symptoms of [name], rashes, or photosensitivity.  He denies any oral or nasal ulcerations.  He experiences dry mouth intermittently.  He denies any shortness of breath, pleuritic chest pain, or palpitations.    Activities of Daily Living:  Patient reports morning stiffness for 0  none .   Patient Denies nocturnal pain.  Difficulty dressing/grooming: Denies Difficulty climbing stairs: Denies Difficulty getting out of chair: Denies Difficulty using hands for taps, buttons, cutlery, and/or writing: Denies  Review of Systems  Constitutional:  Negative for fatigue and night sweats.  HENT:  Positive for mouth dryness. Negative for mouth sores and nose dryness.   Eyes:  Negative for redness and dryness.  Respiratory:  Negative for shortness of breath and difficulty breathing.   Cardiovascular:  Positive for swelling in legs/feet. Negative for chest pain, palpitations, hypertension and irregular heartbeat.  Gastrointestinal:  Negative for  constipation and diarrhea.  Endocrine: Negative for excessive thirst and increased urination.  Genitourinary:  Negative for difficulty urinating and painful urination.  Musculoskeletal:  Positive for joint pain, joint pain and muscle tenderness. Negative for joint swelling, myalgias, muscle weakness, morning stiffness and myalgias.  Skin:  Negative for color change, rash, hair loss, nodules/bumps, skin tightness, ulcers and sensitivity to sunlight.  Allergic/Immunologic: Negative for susceptible to infections.  Neurological:  Negative for dizziness, fainting, memory loss, night sweats and weakness.  Hematological:  Negative for bruising/bleeding tendency and swollen glands.  Psychiatric/Behavioral:  Negative for depressed mood and sleep disturbance. The patient is not nervous/anxious.     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   Fibromyalgia 08/21/2018   History of multiple allergies 08/21/2018   History of sleep apnea 08/21/2018   Rectal bleed 11/14/2016   Essential hypertension 11/14/2016    Past Medical History:  Diagnosis Date   Diverticula of colon    2016 colonoscopy   Fibromyalgia    Fibromyalgia    History of GI diverticular bleed    Hypercholesteremia  Hypertension     Family History  Problem Relation Age of Onset   Heart disease Mother    Pancreatic cancer Mother    Prostate cancer Father    Healthy Sister    Parkinson's disease Brother    Asthma Sister    Healthy Sister    Past Surgical History:  Procedure Laterality Date   allergy shots  weekly    CHOLECYSTECTOMY     COLONOSCOPY     Dr.Rehman   COLONOSCOPY N/A 01/27/2015   Rehman: multiple diverticula at sigmoid colon,ext hemorrhoids   EYE SURGERY Bilateral    partial cornea transplants    GALLBLADDER SURGERY  12/1993   UPPER GASTROINTESTINAL ENDOSCOPY     Social History   Social History Narrative   Not on file   Immunization History  Administered Date(s) Administered   Influenza-Unspecified 04/16/2020   PFIZER(Purple Top)SARS-COV-2 Vaccination 08/05/2019, 08/26/2019   Zoster Recombinat (Shingrix) 01/03/2018, 06/12/2018     Objective: Vital Signs: BP 126/73 (BP Location: Left Arm, Patient Position: Sitting, Cuff Size: Normal)   Pulse 69   Resp 16   Ht 6' (1.829 m)   Wt 198 lb (89.8 kg)   BMI 26.85 kg/m    Physical Exam Vitals and nursing note reviewed.  Constitutional:      Appearance: He is well-developed.  HENT:     Head: Normocephalic and atraumatic.  Eyes:     Conjunctiva/sclera: Conjunctivae normal.     Pupils: Pupils are equal, round, and reactive to light.  Cardiovascular:     Rate and Rhythm: Normal rate and regular rhythm.     Heart sounds: Normal heart sounds.  Pulmonary:     Effort: Pulmonary effort is normal.     Breath sounds: Normal breath sounds.  Abdominal:     General: Bowel sounds are normal.     Palpations: Abdomen is soft.  Musculoskeletal:     Cervical back: Normal range of motion and neck supple.  Skin:    General: Skin is warm and dry.     Capillary Refill: Capillary refill takes less than 2 seconds.  Neurological:     Mental Status: He is alert and oriented to person, place, and time.  Psychiatric:        Behavior: Behavior normal.      Musculoskeletal Exam: C-spine, thoracic spine, lumbar spine have good range of motion.  No midline spinal tenderness or SI joint tenderness.  Shoulder joints, elbow joints, wrist joints, MCPs, PIPs, DIPs have good range of motion with no synovitis.  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  Complete fist formation bilaterally.  Hip joints have good range of motion with no groin pain.  Knee joints have good range of motion with no warmth or effusion.  Ankle joints have good range of motion with no tenderness or joint swelling.  CDAI Exam: CDAI Score: -- Patient Global: --; Provider Global: -- Swollen: --; Tender: -- Joint Exam 12/04/2021   No joint exam has been documented for this visit   There is currently no information documented on the homunculus. Go to the Rheumatology activity and complete the homunculus joint exam.  Investigation: No additional findings.  Imaging: No results found.  Recent Labs: Lab Results  Component Value Date   WBC 7.1 10/09/2021   HGB 12.0 (L) 10/09/2021   PLT 229 10/09/2021   NA 137 10/09/2021   K 4.1 10/09/2021   CL 101 10/09/2021   CO2 23 10/09/2021   GLUCOSE  92 10/09/2021   BUN 18 10/09/2021   CREATININE 1.03 10/09/2021   BILITOT 0.4 10/09/2021   ALKPHOS 138 (H) 10/09/2021   AST 15 10/09/2021   ALT 12 10/09/2021   PROT 6.6 10/09/2021   ALBUMIN 4.1 10/09/2021   CALCIUM 9.1 10/09/2021   GFRAA 89 03/22/2020   QFTBGOLDPLUS NEGATIVE 08/21/2018    Speciality Comments: Plaquenil April 2020-discontinued due to rash Methotrexate is started Nov 06, 2018  Procedures:  No procedures performed Allergies: Plaquenil [hydroxychloroquine sulfate] and Lodine [etodolac]      Assessment / Plan:     Visit Diagnoses: Autoimmune disease ([place]) - ANA+ in 2018, ENA negative, [name] phenomenon, beta-2 GP 1 IgM positive, anticardiolipin IgM positive, elevated ESR, severe inflammatory arthritis: He has not had any signs or symptoms of a flare since prior to his last office visit.  He has clinically been doing well taking methotrexate 8 tablets by mouth once weekly and folic acid 2 mg daily.  He is tolerating methotrexate without any side effects or recurrent infections.  He has no synovitis on examination today.   He has not had any oral or nasal ulcerations, rashes, photosensitivity, or cervical lymphadenopathy.  No shortness of breath, pleuritic chest pain, palpitations.  His lungs were clear to auscultation on examination today.   Lab results from 10/09/21 were reviewed today in the office: beta-2 glycoprotein IgG and IgM remain positive, anticardiolipin IgM remains positive, complements WNL.  dsDNA is negative, and ESR WNL.  No change in treatment recommended at that time.  He will return for lab work in July and every 3 months to monitor for drug toxicity.  He will remain on methotrexate as prescribed.  He was advised to notify us if he develops signs or symptoms of a flare.  He will follow-up in the office in 5 months or sooner if needed.  High risk medication use - Methotrexate 8 tablets by mouth once weekly, folic acid 2 mg by mouth daily.  Methotrexate was initiated May 2020.  Previous therapy includes Plaquenil which was discontinued due to developing a rash. CBC and CMP updated on 10/09/21.  His next lab work will be due in July and every 3 months.  Standing orders for CBC and CMP remain in place.  He has not had any recent infections.  Discussed the importance of holding methotrexate if he develops signs or symptoms of an infection and to resume once the infection has completely cleared.   Anti-cardiolipin antibody positive -  Dr. Debe Coder.  Office visit note from 05/02/2021 was reviewed today in the office.  He is currently taking aspirin 81 mg by mouth daily for primary prophylaxis. No history of arterial or venous thrombosis.  Anticardiolipin antibody IgM and beta-2 glycoprotein IgG and IgM remain positive on 10/09/2021.  He was strongly encouraged to remain on aspirin as previously recommended.  [name] disease without gangrene: Not currently active.  No digital ulcerations or signs of sclerodactyly.  His symptoms are typically worse during the winter months.  He remains on aspirin 81 mg  daily.  Trochanteric bursitis, right hip: He experiences occasional discomfort due to right trochanter bursitis.  He was given a handout of exercises to perform at his last office visit.  Fibromyalgia: Stable.  He has not had any signs or symptoms of a fibromyalgia flare recently.  His symptoms have been tolerable.  Other medical conditions are listed as follows:  History of iron deficiency anemia  History of diverticulitis  Fuchs' corneal dystrophy, unspecified laterality  Essential  hypertension: Blood pressure was 126/73 today in the office.  Hypercholesteremia  History of sleep apnea  Orders: Orders Placed This Encounter  Procedures   CBC with Differential/Platelet   COMPLETE METABOLIC PANEL WITH GFR   Urinalysis, Routine w reflex microscopic   Anti-DNA antibody, double-stranded   C3 and C4   Sedimentation rate   ANA   Beta-2 glycoprotein antibodies   Cardiolipin antibodies, IgG, IgM, IgA   No orders of the defined types were placed in this encounter.   Follow-Up Instructions: Return in about 5 months (around 05/06/2022) for Autoimmune Disease, Fibromyalgia.   Ofilia Neas, PA-C  Note - This record has been created using Dragon software.  Chart creation errors have been sought, but may not always  have been located. Such creation errors do not reflect on  the standard of medical care.

## 2021-11-28 DIAGNOSIS — J3089 Other allergic rhinitis: Secondary | ICD-10-CM | POA: Diagnosis not present

## 2021-11-28 DIAGNOSIS — J3081 Allergic rhinitis due to animal (cat) (dog) hair and dander: Secondary | ICD-10-CM | POA: Diagnosis not present

## 2021-11-28 DIAGNOSIS — J301 Allergic rhinitis due to pollen: Secondary | ICD-10-CM | POA: Diagnosis not present

## 2021-12-04 ENCOUNTER — Encounter: Payer: Self-pay | Admitting: Physician Assistant

## 2021-12-04 ENCOUNTER — Ambulatory Visit: Payer: Medicare HMO | Admitting: Physician Assistant

## 2021-12-04 VITALS — BP 126/73 | HR 69 | Resp 16 | Ht 72.0 in | Wt 198.0 lb

## 2021-12-04 DIAGNOSIS — H18519 Endothelial corneal dystrophy, unspecified eye: Secondary | ICD-10-CM

## 2021-12-04 DIAGNOSIS — I7300 Raynaud's syndrome without gangrene: Secondary | ICD-10-CM

## 2021-12-04 DIAGNOSIS — R760 Raised antibody titer: Secondary | ICD-10-CM

## 2021-12-04 DIAGNOSIS — Z862 Personal history of diseases of the blood and blood-forming organs and certain disorders involving the immune mechanism: Secondary | ICD-10-CM

## 2021-12-04 DIAGNOSIS — M7061 Trochanteric bursitis, right hip: Secondary | ICD-10-CM

## 2021-12-04 DIAGNOSIS — J3089 Other allergic rhinitis: Secondary | ICD-10-CM | POA: Diagnosis not present

## 2021-12-04 DIAGNOSIS — Z79899 Other long term (current) drug therapy: Secondary | ICD-10-CM | POA: Diagnosis not present

## 2021-12-04 DIAGNOSIS — M359 Systemic involvement of connective tissue, unspecified: Secondary | ICD-10-CM | POA: Diagnosis not present

## 2021-12-04 DIAGNOSIS — Z8719 Personal history of other diseases of the digestive system: Secondary | ICD-10-CM

## 2021-12-04 DIAGNOSIS — I10 Essential (primary) hypertension: Secondary | ICD-10-CM

## 2021-12-04 DIAGNOSIS — M797 Fibromyalgia: Secondary | ICD-10-CM

## 2021-12-04 DIAGNOSIS — J301 Allergic rhinitis due to pollen: Secondary | ICD-10-CM | POA: Diagnosis not present

## 2021-12-04 DIAGNOSIS — Z8669 Personal history of other diseases of the nervous system and sense organs: Secondary | ICD-10-CM

## 2021-12-04 DIAGNOSIS — J3081 Allergic rhinitis due to animal (cat) (dog) hair and dander: Secondary | ICD-10-CM | POA: Diagnosis not present

## 2021-12-04 DIAGNOSIS — E7800 Pure hypercholesterolemia, unspecified: Secondary | ICD-10-CM

## 2021-12-04 NOTE — Patient Instructions (Signed)
Standing Labs ?We placed an order today for your standing lab work.  ? ?Please have your standing labs drawn in July and every 3 months  ? ?If possible, please have your labs drawn 2 weeks prior to your appointment so that the provider can discuss your results at your appointment. ? ?Please note that you may see your imaging and lab results in MyChart before we have reviewed them. ?We may be awaiting multiple results to interpret others before contacting you. ?Please allow our office up to 72 hours to thoroughly review all of the results before contacting the office for clarification of your results. ? ?We have open lab daily: ?Monday through Thursday from 1:30-4:30 PM and Friday from 1:30-4:00 PM ?at the office of Dr. Shaili Deveshwar, [facility].   ?Please be advised, all patients with office appointments requiring lab work will take precedent over walk-in lab work.  ?If possible, please come for your lab work on Monday and Friday afternoons, as you may experience shorter wait times. ?The office is located at [address], [address], [place], [place] [address] ?No appointment is necessary.   ?Labs are drawn by [facility]. Please bring your co-pay at the time of your lab draw.  You may receive a bill from [facility] for your lab work. ? ?Please note if you are on Hydroxychloroquine and and an order has been placed for a Hydroxychloroquine level, you will need to have it drawn 4 hours or more after your last dose. ? ?If you wish to have your labs drawn at another location, please call the office 24 hours in advance to send orders. ? ?If you have any questions regarding directions or hours of operation,  ?please call [phone].   ?As a reminder, please drink plenty of water prior to coming for your lab work. Thanks! ? ?

## 2021-12-11 DIAGNOSIS — J3089 Other allergic rhinitis: Secondary | ICD-10-CM | POA: Diagnosis not present

## 2021-12-11 DIAGNOSIS — J301 Allergic rhinitis due to pollen: Secondary | ICD-10-CM | POA: Diagnosis not present

## 2021-12-11 DIAGNOSIS — J3081 Allergic rhinitis due to animal (cat) (dog) hair and dander: Secondary | ICD-10-CM | POA: Diagnosis not present

## 2021-12-21 ENCOUNTER — Other Ambulatory Visit: Payer: Self-pay | Admitting: Physician Assistant

## 2021-12-21 DIAGNOSIS — M359 Systemic involvement of connective tissue, unspecified: Secondary | ICD-10-CM

## 2021-12-21 NOTE — Telephone Encounter (Signed)
Next Visit: 04/26/2022  Last Visit: 12/04/2021  Last Fill: 10/09/2021  DX: Autoimmune disease   Current Dose per office note 12/04/2021: Methotrexate 8 tablets by mouth once weekly  Labs: 10/09/2021 Beta-2 GP 1 IgG is low titer positive, anticardiolipin IgM is positive.  All other labs were within normal limits.    Okay to refill MTX?

## 2021-12-26 DIAGNOSIS — H353122 Nonexudative age-related macular degeneration, left eye, intermediate dry stage: Secondary | ICD-10-CM | POA: Diagnosis not present

## 2021-12-26 DIAGNOSIS — H353211 Exudative age-related macular degeneration, right eye, with active choroidal neovascularization: Secondary | ICD-10-CM | POA: Diagnosis not present

## 2021-12-26 DIAGNOSIS — J3089 Other allergic rhinitis: Secondary | ICD-10-CM | POA: Diagnosis not present

## 2021-12-26 DIAGNOSIS — J301 Allergic rhinitis due to pollen: Secondary | ICD-10-CM | POA: Diagnosis not present

## 2021-12-26 DIAGNOSIS — J3081 Allergic rhinitis due to animal (cat) (dog) hair and dander: Secondary | ICD-10-CM | POA: Diagnosis not present

## 2022-01-08 DIAGNOSIS — J301 Allergic rhinitis due to pollen: Secondary | ICD-10-CM | POA: Diagnosis not present

## 2022-01-08 DIAGNOSIS — J3089 Other allergic rhinitis: Secondary | ICD-10-CM | POA: Diagnosis not present

## 2022-01-08 DIAGNOSIS — J3081 Allergic rhinitis due to animal (cat) (dog) hair and dander: Secondary | ICD-10-CM | POA: Diagnosis not present

## 2022-01-15 DIAGNOSIS — J3081 Allergic rhinitis due to animal (cat) (dog) hair and dander: Secondary | ICD-10-CM | POA: Diagnosis not present

## 2022-01-15 DIAGNOSIS — J3089 Other allergic rhinitis: Secondary | ICD-10-CM | POA: Diagnosis not present

## 2022-01-15 DIAGNOSIS — J301 Allergic rhinitis due to pollen: Secondary | ICD-10-CM | POA: Diagnosis not present

## 2022-01-19 DIAGNOSIS — J3081 Allergic rhinitis due to animal (cat) (dog) hair and dander: Secondary | ICD-10-CM | POA: Diagnosis not present

## 2022-01-19 DIAGNOSIS — J3089 Other allergic rhinitis: Secondary | ICD-10-CM | POA: Diagnosis not present

## 2022-01-22 DIAGNOSIS — J3089 Other allergic rhinitis: Secondary | ICD-10-CM | POA: Diagnosis not present

## 2022-01-22 DIAGNOSIS — J301 Allergic rhinitis due to pollen: Secondary | ICD-10-CM | POA: Diagnosis not present

## 2022-01-22 DIAGNOSIS — J3081 Allergic rhinitis due to animal (cat) (dog) hair and dander: Secondary | ICD-10-CM | POA: Diagnosis not present

## 2022-01-25 ENCOUNTER — Other Ambulatory Visit: Payer: Self-pay | Admitting: Physician Assistant

## 2022-01-25 DIAGNOSIS — M359 Systemic involvement of connective tissue, unspecified: Secondary | ICD-10-CM

## 2022-01-29 ENCOUNTER — Other Ambulatory Visit: Payer: Self-pay | Admitting: *Deleted

## 2022-01-29 DIAGNOSIS — R760 Raised antibody titer: Secondary | ICD-10-CM

## 2022-01-29 DIAGNOSIS — J301 Allergic rhinitis due to pollen: Secondary | ICD-10-CM | POA: Diagnosis not present

## 2022-01-29 DIAGNOSIS — M359 Systemic involvement of connective tissue, unspecified: Secondary | ICD-10-CM | POA: Diagnosis not present

## 2022-01-29 DIAGNOSIS — Z79899 Other long term (current) drug therapy: Secondary | ICD-10-CM | POA: Diagnosis not present

## 2022-01-29 DIAGNOSIS — J3081 Allergic rhinitis due to animal (cat) (dog) hair and dander: Secondary | ICD-10-CM | POA: Diagnosis not present

## 2022-01-29 DIAGNOSIS — J3089 Other allergic rhinitis: Secondary | ICD-10-CM | POA: Diagnosis not present

## 2022-01-30 NOTE — Progress Notes (Signed)
ESR is borderline elevated-22.  Less than 20 is WNL.   Anemia stable.  Glucose is 124. Sodium is low-131. Rest of CMP WNL.  UA normal.

## 2022-01-31 NOTE — Progress Notes (Signed)
Complements WNL.  dsDNA is negative.

## 2022-02-01 NOTE — Progress Notes (Signed)
ANA remains positive.  Labs are not consistent with a flare at this time.

## 2022-02-02 DIAGNOSIS — H353211 Exudative age-related macular degeneration, right eye, with active choroidal neovascularization: Secondary | ICD-10-CM | POA: Diagnosis not present

## 2022-02-02 DIAGNOSIS — H43813 Vitreous degeneration, bilateral: Secondary | ICD-10-CM | POA: Diagnosis not present

## 2022-02-02 DIAGNOSIS — H353122 Nonexudative age-related macular degeneration, left eye, intermediate dry stage: Secondary | ICD-10-CM | POA: Diagnosis not present

## 2022-02-02 DIAGNOSIS — H26493 Other secondary cataract, bilateral: Secondary | ICD-10-CM | POA: Diagnosis not present

## 2022-02-03 LAB — CBC WITH DIFFERENTIAL/PLATELET
Absolute Monocytes: 595 cells/uL (ref 200–950)
Basophils Absolute: 77 cells/uL (ref 0–200)
Basophils Relative: 0.9 %
Eosinophils Absolute: 187 cells/uL (ref 15–500)
Eosinophils Relative: 2.2 %
HCT: 36.2 % — ABNORMAL LOW (ref 38.5–50.0)
Hemoglobin: 12 g/dL — ABNORMAL LOW (ref 13.2–17.1)
Lymphs Abs: 978 cells/uL (ref 850–3900)
MCH: 30.4 pg (ref 27.0–33.0)
MCHC: 33.1 g/dL (ref 32.0–36.0)
MCV: 91.6 fL (ref 80.0–100.0)
MPV: 9 fL (ref 7.5–12.5)
Monocytes Relative: 7 %
Neutro Abs: 6664 cells/uL (ref 1500–7800)
Neutrophils Relative %: 78.4 %
Platelets: 267 10*3/uL (ref 140–400)
RBC: 3.95 10*6/uL — ABNORMAL LOW (ref 4.20–5.80)
RDW: 14.4 % (ref 11.0–15.0)
Total Lymphocyte: 11.5 %
WBC: 8.5 10*3/uL (ref 3.8–10.8)

## 2022-02-03 LAB — COMPLETE METABOLIC PANEL WITH GFR
AG Ratio: 1.4 (calc) (ref 1.0–2.5)
ALT: 13 U/L (ref 9–46)
AST: 11 U/L (ref 10–35)
Albumin: 3.8 g/dL (ref 3.6–5.1)
Alkaline phosphatase (APISO): 112 U/L (ref 35–144)
BUN: 19 mg/dL (ref 7–25)
CO2: 23 mmol/L (ref 20–32)
Calcium: 8.9 mg/dL (ref 8.6–10.3)
Chloride: 99 mmol/L (ref 98–110)
Creat: 1 mg/dL (ref 0.70–1.28)
Globulin: 2.7 g/dL (calc) (ref 1.9–3.7)
Glucose, Bld: 124 mg/dL — ABNORMAL HIGH (ref 65–99)
Potassium: 4.3 mmol/L (ref 3.5–5.3)
Sodium: 131 mmol/L — ABNORMAL LOW (ref 135–146)
Total Bilirubin: 0.5 mg/dL (ref 0.2–1.2)
Total Protein: 6.5 g/dL (ref 6.1–8.1)
eGFR: 77 mL/min/{1.73_m2} (ref >=60)

## 2022-02-03 LAB — SEDIMENTATION RATE: Sed Rate: 22 mm/h — ABNORMAL HIGH (ref 0–20)

## 2022-02-03 LAB — ANTI-DNA ANTIBODY, DOUBLE-STRANDED: ds DNA Ab: <1 IU/mL

## 2022-02-03 LAB — BETA-2 GLYCOPROTEIN ANTIBODIES
Beta-2 Glyco 1 IgA: 12.1 U/mL (ref <20.0)
Beta-2 Glyco 1 IgM: >112 U/mL — ABNORMAL HIGH (ref <20.0)
Beta-2 Glyco I IgG: 11.6 U/mL (ref <20.0)

## 2022-02-03 LAB — URINALYSIS, ROUTINE W REFLEX MICROSCOPIC
Bilirubin Urine: NEGATIVE
Glucose, UA: NEGATIVE
Hgb urine dipstick: NEGATIVE
Ketones, ur: NEGATIVE
Leukocytes,Ua: NEGATIVE
Nitrite: NEGATIVE
Protein, ur: NEGATIVE
Specific Gravity, Urine: 1.017 (ref 1.001–1.035)
pH: 6 (ref 5.0–8.0)

## 2022-02-03 LAB — CARDIOLIPIN ANTIBODIES, IGG, IGM, IGA
Anticardiolipin IgA: 14 APL-U/mL (ref <20.0)
Anticardiolipin IgG: 14 GPL-U/mL (ref <20.0)
Anticardiolipin IgM: >112 MPL-U/mL — ABNORMAL HIGH (ref <20.0)

## 2022-02-03 LAB — C3 AND C4
C3 Complement: 134 mg/dL (ref 82–185)
C4 Complement: 25 mg/dL (ref 15–53)

## 2022-02-03 LAB — ANTI-NUCLEAR AB-TITER (ANA TITER): ANA Titer 1: 1:1280 {titer} — ABNORMAL HIGH

## 2022-02-03 LAB — ANA: Anti Nuclear Antibody (ANA): POSITIVE — AB

## 2022-02-05 NOTE — Progress Notes (Signed)
Anticardiolipin IgM and beta-2 glycoprotein IgM remain positive.  Reviewed Dr. Tomie China note from 05/02/21: Recommended following up in 1 year and continuing aspirin 325 mg daily.  Please clarify if he has been taking aspirin 325 mg daily.

## 2022-02-07 DIAGNOSIS — J3081 Allergic rhinitis due to animal (cat) (dog) hair and dander: Secondary | ICD-10-CM | POA: Diagnosis not present

## 2022-02-07 DIAGNOSIS — J3089 Other allergic rhinitis: Secondary | ICD-10-CM | POA: Diagnosis not present

## 2022-02-07 DIAGNOSIS — J301 Allergic rhinitis due to pollen: Secondary | ICD-10-CM | POA: Diagnosis not present

## 2022-02-12 DIAGNOSIS — J3089 Other allergic rhinitis: Secondary | ICD-10-CM | POA: Diagnosis not present

## 2022-02-12 DIAGNOSIS — J301 Allergic rhinitis due to pollen: Secondary | ICD-10-CM | POA: Diagnosis not present

## 2022-02-12 DIAGNOSIS — J3081 Allergic rhinitis due to animal (cat) (dog) hair and dander: Secondary | ICD-10-CM | POA: Diagnosis not present

## 2022-02-13 ENCOUNTER — Ambulatory Visit
Admission: EM | Admit: 2022-02-13 | Discharge: 2022-02-13 | Disposition: A | Discharge status: Home / Self Care | Payer: Medicare HMO | Attending: Nurse Practitioner | Admitting: Nurse Practitioner

## 2022-02-13 DIAGNOSIS — M545 Low back pain, unspecified: Secondary | ICD-10-CM | POA: Diagnosis not present

## 2022-02-13 LAB — POCT URINALYSIS DIP (MANUAL ENTRY)
Bilirubin, UA: NEGATIVE
Blood, UA: NEGATIVE
Glucose, UA: NEGATIVE mg/dL
Ketones, POC UA: NEGATIVE mg/dL
Leukocytes, UA: NEGATIVE
Nitrite, UA: NEGATIVE
Protein Ur, POC: NEGATIVE mg/dL
Spec Grav, UA: 1.015 (ref 1.010–1.025)
Urobilinogen, UA: 0.2 E.U./dL
pH, UA: 7 (ref 5.0–8.0)

## 2022-02-13 NOTE — Discharge Instructions (Addendum)
The urinalysis does not suggest that you have a kidney stone. May take over-the-counter Tylenol arthritis strength 650 mg tablets every 8 hours to help with pain or discomfort. May apply ice or heat as needed.  Apply ice for pain or swelling, heat for spasm or stiffness.  Apply for 20 minutes, remove for 1 hour, then repeat. Gentle stretching and range of motion exercises while symptoms persist. Go to the emergency department immediately if you develop loss of bowel or bladder function, difficulty walking or inability to walk, weakness in your lower extremities, or other concerns. Follow-up with your primary care physician if symptoms fail to improve.

## 2022-02-13 NOTE — ED Triage Notes (Signed)
Pt reports left sided lower back pain since 10:30 am today. States he was stand when he started having the pain. States pain was 7/10 when started, now ios 4/10.Pt has not taken any meds for complaint.

## 2022-02-13 NOTE — ED Provider Notes (Signed)
[facility]    CSN: [id number] Arrival date & time: 02/13/22  1217      History   Chief Complaint Chief Complaint  Patient presents with   Back Pain    HPI Cole Brown is a 76 y.o. male.   The history is provided by the patient and the spouse.   Patient presents for complaints of left-sided low back pain that started earlier today.  Patient states he was standing in the kitchen when his symptoms started.  Patient states the pain is described as a "strong ache".  He states that the pain has improved since the initial onset.  He states the pain radiates slightly into the left buttock.  He denies any injury, trauma, fall, urinary symptoms, abdominal pain, constipation, weakness, numbness, or tingling.  Patient has not taken any medication for his symptoms.  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   Fibromyalgia 08/21/2018   History of multiple allergies 08/21/2018   History of sleep apnea 08/21/2018   Rectal bleed 11/14/2016   Essential hypertension 11/14/2016    Past Surgical History:  Procedure Laterality Date   allergy shots  weekly   CHOLECYSTECTOMY     COLONOSCOPY     Dr.Rehman   COLONOSCOPY N/A 01/27/2015   Rehman: multiple diverticula at sigmoid colon,ext hemorrhoids   EYE SURGERY Bilateral    partial cornea transplants    GALLBLADDER SURGERY  12/1993   UPPER  GASTROINTESTINAL ENDOSCOPY         Home Medications    Prior to Admission medications   Medication Sig Start Date End Date Taking? Authorizing Provider  albuterol (VENTOLIN HFA) 108 (90 Base) MCG/ACT inhaler Inhale 1 puff into the lungs as needed. 02/03/21   [provider]  amLODipine (NORVASC) 5 MG tablet Take 5 mg by mouth daily.    [provider]  aspirin 81 MG EC tablet Take 1 tablet (81 mg total) by mouth daily. 07/25/21   Arnoldo Lenis, MD  atenolol (TENORMIN) 25 MG tablet Take 0.5 tablets (12.5 mg total) by mouth daily. 07/25/21 10/23/21  Arnoldo Lenis, MD  azelastine (ASTELIN) 0.1 % nasal spray Place 1 spray into both nostrils 2 (two) times daily. 09/17/19   [provider]  Cholecalciferol (VITAMIN D) 50 MCG (2000 UT) CAPS Take 2,000 Units by mouth daily.    [provider]  clobetasol (OLUX) 0.05 % topical foam Apply topically 2 (two) times daily. Patient not taking: Reported on 12/04/2021 10/04/21   Lavonna Monarch, MD  clobetasol (TEMOVATE) 0.05 % external solution Apply 1 application topically 2 (two) times daily. Patient taking differently: Apply 1 application  topically as needed. 06/27/20   Lavonna Monarch, MD  dicyclomine (BENTYL) 10 MG capsule Take 1 capsule (10 mg total) by mouth daily before breakfast. 07/06/19   Rehman, Mechele Dawley, MD  diphenhydrAMINE (BENADRYL) 25 MG tablet Take 25 mg by mouth  as needed for allergies.     [provider]  Doxepin HCl (ZONALON) 5 % CREA Apply 1 application topically daily. Patient not taking: Reported on 12/04/2021 06/27/20   Lavonna Monarch, MD  EPINEPHrine 0.3 mg/0.3 mL IJ SOAJ injection INJECT INTO THIGH ASONEEDED FOR ALLERGIC REACTION. 10/14/19   [provider]  famotidine (PEPCID) 20 MG tablet Take 20 mg by mouth daily.    [provider]  FLUoxetine (PROZAC) 10 MG tablet Take 10 mg by mouth daily.    [provider]  fluticasone Asencion Islam) 50 MCG/ACT nasal spray  SMARTSIG:1-2 Spray(s) Both Nares Daily 07/27/20   [provider]  folic acid (FOLVITE) 1 MG tablet TAKE 2 TABLETS BY MOUTH DAILY. 10/23/21   Ofilia Neas, PA-C  hydrocortisone (ANUSOL-HC) 2.5 % rectal cream Place 1 application rectally 2 (two) times daily. 03/30/21   Carlan, Chelsea L, NP  hydrOXYzine (ATARAX/VISTARIL) 25 MG tablet Take 25 mg by mouth every 8 (eight) hours as needed. 04/15/20   [provider]  Investigational - Study Medication Study name: atorvastatin '40mg'$  or placebo.    [provider]  levocetirizine (XYZAL) 5 MG tablet Take 5 mg by mouth every evening.     [provider]  methotrexate (RHEUMATREX) 2.5 MG tablet TAKE 8 TABLETS BY MOUTH ONCE WEEKLY. 12/21/21   Ofilia Neas, PA-C  montelukast (SINGULAIR) 10 MG tablet Take 10 mg by mouth at bedtime.    [provider]  Multiple Vitamins-Minerals (ICAPS AREDS 2 PO) Take by mouth.    [provider]  naproxen sodium (ALEVE) 220 MG tablet Take 220 mg by mouth daily as needed.    [provider]  Omega-3 Fatty Acids (FISH OIL) 1000 MG CAPS Take 1 capsule by mouth 2 (two) times daily.    [provider]  Testosterone (ANDROGEL TD) Place onto the skin daily.    [provider]  triazolam (HALCION) 0.25 MG tablet Take 0.25 mg by mouth at bedtime.     [provider]    Family History Family History  Problem Relation Age of Onset   Heart disease Mother    Pancreatic cancer Mother    Prostate cancer Father    Healthy Sister    Parkinson's disease Brother    Asthma Sister    Healthy Sister     Social History Social History   Tobacco Use   Smoking status: Never   Smokeless tobacco: Never  Vaping Use   Vaping Use: Never used  Substance Use Topics   Alcohol use: Yes    Alcohol/week: 0.0 standard drinks of alcohol    Comment: occas   Drug use: No     Allergies   Plaquenil [hydroxychloroquine sulfate] and Lodine  [etodolac]   Review of Systems Review of Systems Per HPI  Physical Exam Triage Vital Signs ED Triage Vitals  Enc Vitals Group     BP 02/13/22 1243 (!) 154/84     Pulse Rate 02/13/22 1243 (!) 55     Resp 02/13/22 1243 18     Temp 02/13/22 1243 98 F (36.7 C)     Temp Source 02/13/22 1243 Oral     SpO2 02/13/22 1243 98 %     Weight --      Height --      Head Circumference --      Peak Flow --      Pain Score 02/13/22 1242 4     Pain Loc --  Pain Edu? --      Excl. in [place]? --    No data found.  Updated Vital Signs BP (!) 154/84 (BP Location: Right Arm)   Pulse (!) 55   Temp 98 F (36.7 C) (Oral)   Resp 18   SpO2 98%   Visual Acuity Right Eye Distance:   Left Eye Distance:   Bilateral Distance:    Right Eye Near:   Left Eye Near:    Bilateral Near:     Physical Exam Vitals and nursing note reviewed.  Constitutional:      General: He is not in acute distress.    Appearance: Normal appearance.  HENT:     Head: Normocephalic.  Eyes:     Extraocular Movements: Extraocular movements intact.     Pupils: Pupils are equal, round, and reactive to light.  Cardiovascular:     Rate and Rhythm: Normal rate and regular rhythm.     Pulses: Normal pulses.     Heart sounds: Normal heart sounds.  Pulmonary:     Effort: Pulmonary effort is normal.     Breath sounds: Normal breath sounds.  Abdominal:     General: Bowel sounds are normal.     Palpations: Abdomen is soft.  Musculoskeletal:     Cervical back: Normal range of motion.     Lumbar back: Normal. No swelling, edema, deformity, signs of trauma, spasms or tenderness. Normal range of motion. Negative right straight leg raise test and negative left straight leg raise test.  Lymphadenopathy:     Cervical: No cervical adenopathy.  Skin:    General: Skin is warm and dry.  Neurological:     General: No focal deficit present.     Mental Status: He is alert and oriented to person, place, and time.  Psychiatric:         Mood and Affect: Mood normal.        Behavior: Behavior normal.      UC Treatments / Results  Labs (all labs ordered are listed, but only abnormal results are displayed) Labs Reviewed  POCT URINALYSIS DIP (MANUAL ENTRY)    EKG   Radiology No results found.  Procedures Procedures (including critical care time)  Medications Ordered in UC Medications - No data to display  Initial Impression / Assessment and Plan / UC Course  I have reviewed the triage vital signs and the nursing notes.  Pertinent labs & imaging results that were available during my care of the patient were reviewed by me and considered in my medical decision making (see chart for details).  Patient presents for complaints of sudden onset of low back pain that started this morning.  Pain is localized to the left lower back.  Denies any injury or trauma to his lower back.  On exam, patient has no spasm, tenderness, deformity, or swelling.  Urinalysis does not suggest a kidney stone at this time.  Symptoms appear to be most consistent with low back pain.  Discussed with patient that due to new onset of symptoms, to continue to monitor symptoms for worsening.  Patient was advised to use Tylenol arthritis strength 650 mg tablets every 8 hours to help with pain.  Also recommended use of ice or heat.  Supportive care recommendations were provided to the patient to include strict indications of when to go to the emergency department.  Patient advised to follow-up with his primary care physician if symptoms fail to improve. Final Clinical Impressions(s) / UC Diagnoses  Final diagnoses:  Acute left-sided low back pain, unspecified whether sciatica present     Discharge Instructions      The urinalysis does not suggest that you have a kidney stone. May take over-the-counter Tylenol arthritis strength 650 mg tablets every 8 hours to help with pain or discomfort. May apply ice or heat as needed.  Apply ice for pain  or swelling, heat for spasm or stiffness.  Apply for 20 minutes, remove for 1 hour, then repeat. Gentle stretching and range of motion exercises while symptoms persist. Go to the emergency department immediately if you develop loss of bowel or bladder function, difficulty walking or inability to walk, weakness in your lower extremities, or other concerns. Follow-up with your primary care physician if symptoms fail to improve.     ED Prescriptions   None    PDMP not reviewed this encounter.   Tish Men, NP 02/13/22 1332

## 2022-02-21 ENCOUNTER — Other Ambulatory Visit: Payer: Self-pay | Admitting: Physician Assistant

## 2022-02-21 DIAGNOSIS — M359 Systemic involvement of connective tissue, unspecified: Secondary | ICD-10-CM

## 2022-02-21 NOTE — Telephone Encounter (Signed)
Next Visit: 04/26/2022   Last Visit: 12/04/2021   Last Fill: 12/21/2021 (30 day supply)   DX: Autoimmune disease    Current Dose per office note 12/04/2021: Methotrexate 8 tablets by mouth once weekly   Labs: 01/29/2022 Anemia stable.  Glucose is 124. Sodium is low-131. Rest of CMP WNL.   Okay to refill MTX?

## 2022-02-22 DIAGNOSIS — J3089 Other allergic rhinitis: Secondary | ICD-10-CM | POA: Diagnosis not present

## 2022-02-22 DIAGNOSIS — J3081 Allergic rhinitis due to animal (cat) (dog) hair and dander: Secondary | ICD-10-CM | POA: Diagnosis not present

## 2022-02-22 DIAGNOSIS — J301 Allergic rhinitis due to pollen: Secondary | ICD-10-CM | POA: Diagnosis not present

## 2022-02-27 DIAGNOSIS — J3089 Other allergic rhinitis: Secondary | ICD-10-CM | POA: Diagnosis not present

## 2022-02-27 DIAGNOSIS — J301 Allergic rhinitis due to pollen: Secondary | ICD-10-CM | POA: Diagnosis not present

## 2022-02-27 DIAGNOSIS — J3081 Allergic rhinitis due to animal (cat) (dog) hair and dander: Secondary | ICD-10-CM | POA: Diagnosis not present

## 2022-03-02 DIAGNOSIS — H353122 Nonexudative age-related macular degeneration, left eye, intermediate dry stage: Secondary | ICD-10-CM | POA: Diagnosis not present

## 2022-03-02 DIAGNOSIS — H43811 Vitreous degeneration, right eye: Secondary | ICD-10-CM | POA: Diagnosis not present

## 2022-03-02 DIAGNOSIS — H353211 Exudative age-related macular degeneration, right eye, with active choroidal neovascularization: Secondary | ICD-10-CM | POA: Diagnosis not present

## 2022-03-06 DIAGNOSIS — J3089 Other allergic rhinitis: Secondary | ICD-10-CM | POA: Diagnosis not present

## 2022-03-06 DIAGNOSIS — J3081 Allergic rhinitis due to animal (cat) (dog) hair and dander: Secondary | ICD-10-CM | POA: Diagnosis not present

## 2022-03-06 DIAGNOSIS — J301 Allergic rhinitis due to pollen: Secondary | ICD-10-CM | POA: Diagnosis not present

## 2022-03-12 DIAGNOSIS — J301 Allergic rhinitis due to pollen: Secondary | ICD-10-CM | POA: Diagnosis not present

## 2022-03-12 DIAGNOSIS — J3089 Other allergic rhinitis: Secondary | ICD-10-CM | POA: Diagnosis not present

## 2022-03-12 DIAGNOSIS — J3081 Allergic rhinitis due to animal (cat) (dog) hair and dander: Secondary | ICD-10-CM | POA: Diagnosis not present

## 2022-03-19 ENCOUNTER — Other Ambulatory Visit: Payer: Self-pay | Admitting: Physician Assistant

## 2022-03-19 DIAGNOSIS — J3089 Other allergic rhinitis: Secondary | ICD-10-CM | POA: Diagnosis not present

## 2022-03-19 DIAGNOSIS — M359 Systemic involvement of connective tissue, unspecified: Secondary | ICD-10-CM

## 2022-03-19 DIAGNOSIS — J3081 Allergic rhinitis due to animal (cat) (dog) hair and dander: Secondary | ICD-10-CM | POA: Diagnosis not present

## 2022-03-19 DIAGNOSIS — J301 Allergic rhinitis due to pollen: Secondary | ICD-10-CM | POA: Diagnosis not present

## 2022-03-21 DIAGNOSIS — Z23 Encounter for immunization: Secondary | ICD-10-CM | POA: Diagnosis not present

## 2022-03-21 DIAGNOSIS — E663 Overweight: Secondary | ICD-10-CM | POA: Diagnosis not present

## 2022-03-21 DIAGNOSIS — F419 Anxiety disorder, unspecified: Secondary | ICD-10-CM | POA: Diagnosis not present

## 2022-03-21 DIAGNOSIS — Z6827 Body mass index (BMI) 27.0-27.9, adult: Secondary | ICD-10-CM | POA: Diagnosis not present

## 2022-03-26 DIAGNOSIS — J3081 Allergic rhinitis due to animal (cat) (dog) hair and dander: Secondary | ICD-10-CM | POA: Diagnosis not present

## 2022-03-26 DIAGNOSIS — J301 Allergic rhinitis due to pollen: Secondary | ICD-10-CM | POA: Diagnosis not present

## 2022-03-26 DIAGNOSIS — J3089 Other allergic rhinitis: Secondary | ICD-10-CM | POA: Diagnosis not present

## 2022-04-04 DIAGNOSIS — J3089 Other allergic rhinitis: Secondary | ICD-10-CM | POA: Diagnosis not present

## 2022-04-04 DIAGNOSIS — J3081 Allergic rhinitis due to animal (cat) (dog) hair and dander: Secondary | ICD-10-CM | POA: Diagnosis not present

## 2022-04-04 DIAGNOSIS — J301 Allergic rhinitis due to pollen: Secondary | ICD-10-CM | POA: Diagnosis not present

## 2022-04-06 DIAGNOSIS — H353211 Exudative age-related macular degeneration, right eye, with active choroidal neovascularization: Secondary | ICD-10-CM | POA: Diagnosis not present

## 2022-04-06 DIAGNOSIS — H353122 Nonexudative age-related macular degeneration, left eye, intermediate dry stage: Secondary | ICD-10-CM | POA: Diagnosis not present

## 2022-04-09 DIAGNOSIS — J3089 Other allergic rhinitis: Secondary | ICD-10-CM | POA: Diagnosis not present

## 2022-04-09 DIAGNOSIS — J3081 Allergic rhinitis due to animal (cat) (dog) hair and dander: Secondary | ICD-10-CM | POA: Diagnosis not present

## 2022-04-09 DIAGNOSIS — J301 Allergic rhinitis due to pollen: Secondary | ICD-10-CM | POA: Diagnosis not present

## 2022-04-13 NOTE — Progress Notes (Signed)
Office Visit Note  Patient: Cole Brown             Date of Birth: [birth year]           MRN: [id number]             PCP: Redmond School, MD Referring: Redmond School, MD Visit Date: 04/26/2022 Occupation: _0 @  Subjective:  Medication management  History of Present Illness: Cole Brown is a 76 y.o. male with history of autoimmune disease, osteoarthritis and fibromyalgia.  He states recently has been having pain and discomfort in his bilateral thumb which she describes over bilateral MCP joints.  He denies any history of joint swelling.  He states Raynauds is currently not active as weather is not cold enough.  He continues to take aspirin 81 mg daily and methotrexate 8 tablets p.o. weekly along with folic acid.  He has been followed by hematologist.  He denies any history of oral ulcers, nasal ulcers, malar rash, photosensitivity, or lymphadenopathy.  Activities of Daily Living:  Patient reports morning stiffness for 0  none .   Patient Denies nocturnal pain.  Difficulty dressing/grooming: Denies Difficulty climbing stairs: Denies Difficulty getting out of chair: Denies Difficulty using hands for taps, buttons, cutlery, and/or writing: Denies  Review of Systems  Constitutional:  Negative for fatigue.  HENT:  Positive for mouth dryness. Negative for mouth sores.   Eyes:  Negative for dryness.  Respiratory:  Negative for shortness of breath.   Cardiovascular:  Negative for chest pain and palpitations.  Gastrointestinal:  Negative for blood in stool, constipation and diarrhea.  Endocrine: Negative for increased urination.  Genitourinary:  Negative for involuntary urination.  Musculoskeletal:  Positive for joint swelling. Negative for joint pain, gait problem, joint pain, myalgias, muscle weakness, morning stiffness, muscle tenderness and myalgias.  Skin:  Negative for color change, rash, hair loss and sensitivity to sunlight.  Allergic/Immunologic: Negative for  susceptible to infections.  Neurological:  Negative for dizziness and headaches.  Hematological:  Negative for swollen glands.  Psychiatric/Behavioral:  Positive for sleep disturbance. Negative for depressed mood. The patient is nervous/anxious.     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   Fibromyalgia 08/21/2018   History of multiple allergies 08/21/2018   History of sleep apnea 08/21/2018   Rectal bleed 11/14/2016   Essential hypertension 11/14/2016    Past Medical History:  Diagnosis Date   Diverticula of colon    2016 colonoscopy   Fibromyalgia    Fibromyalgia    History of GI diverticular bleed    Hypercholesteremia    Hypertension     Family History  Problem Relation Age of Onset   Heart disease Mother    Pancreatic cancer Mother    Prostate cancer Father    Healthy Sister    Parkinson's disease Brother    Asthma Sister    Healthy Sister    Past Surgical History:  Procedure Laterality Date   allergy shots  weekly   CHOLECYSTECTOMY     COLONOSCOPY     Dr.Rehman   COLONOSCOPY N/A 01/27/2015   Rehman: multiple diverticula at sigmoid colon,ext hemorrhoids  EYE SURGERY Bilateral    partial cornea transplants    GALLBLADDER SURGERY  12/1993   UPPER GASTROINTESTINAL ENDOSCOPY     Social History   Social History Narrative   Not on file   Immunization History  Administered Date(s) Administered   Influenza-Unspecified 04/16/2020   PFIZER(Purple Top)SARS-COV-2 Vaccination 08/05/2019, 08/26/2019   Zoster Recombinat (Shingrix) 01/03/2018, 06/12/2018      Objective: Vital Signs: BP 127/72 (BP Location: Left Arm, Patient Position: Sitting, Cuff Size: Normal)   Pulse (!) 58   Resp 16   Ht 6' (1.829 m)   Wt 194 lb (88 kg)   BMI 26.31 kg/m    Physical Exam Vitals and nursing note reviewed.  Constitutional:      Appearance: He is well-developed.  HENT:     Head: Normocephalic and atraumatic.  Eyes:     Conjunctiva/sclera: Conjunctivae normal.     Pupils: Pupils are equal, round, and reactive to light.  Cardiovascular:     Rate and Rhythm: Normal rate and regular rhythm.     Heart sounds: Normal heart sounds.  Pulmonary:     Effort: Pulmonary effort is normal.     Breath sounds: Normal breath sounds.  Abdominal:     General: Bowel sounds are normal.     Palpations: Abdomen is soft.  Musculoskeletal:     Cervical back: Normal range of motion and neck supple.  Skin:    General: Skin is warm and dry.     Capillary Refill: Capillary refill takes less than 2 seconds.  Neurological:     Mental Status: He is alert and oriented to person, place, and time.  Psychiatric:        Behavior: Behavior normal.      Musculoskeletal Exam: Good range of motion of the cervical spine.  Shoulder joints, elbow joints, wrist joints, MCPs PIPs and DIPs with good range of motion.  He had bilateral CMC subluxation and thickening.  PIP and DIP thickening was noted.  He did not have any synovitis or tenderness over bilateral first MCP joints.  Hip joints and knee joints were in good range of motion without any warmth swelling or effusion.  There was no tenderness over ankles or MTPs.  CDAI Exam: CDAI Score: -- Patient Global: --; Provider Global: -- Swollen: --; Tender: -- Joint Exam 04/26/2022   No joint exam has been documented for this visit   There is currently no information documented on the homunculus. Go to the Rheumatology activity and complete the homunculus joint exam.  Investigation: No additional findings.  Imaging: No results  found.  Recent Labs: Lab Results  Component Value Date   WBC 8.5 01/29/2022   HGB 12.0 (L) 01/29/2022   PLT 267 01/29/2022   NA 131 (L) 01/29/2022   K 4.3 01/29/2022   CL 99 01/29/2022   CO2 23 01/29/2022   GLUCOSE 124 (H) 01/29/2022   BUN 19 01/29/2022   CREATININE 1.00 01/29/2022   BILITOT 0.5 01/29/2022   ALKPHOS 138 (H) 10/09/2021   AST 11 01/29/2022   ALT 13 01/29/2022   PROT 6.5 01/29/2022   ALBUMIN 4.1 10/09/2021   CALCIUM 8.9 01/29/2022   GFRAA 89 03/22/2020   QFTBGOLDPLUS NEGATIVE 08/21/2018    Speciality Comments: Plaquenil April 2020-discontinued due to rash Methotrexate is started Nov 06, 2018  Procedures:  No procedures performed Allergies: Plaquenil [hydroxychloroquine sulfate] and Lodine [etodolac]   Assessment / Plan:     Visit Diagnoses: Autoimmune disease ([place]) - ANA+ in 2018,  ENA negative, [name] phenomenon, beta-2 GP 1 IgM positive, anticardiolipin IgM positive, elevated ESR, severe inflammatory arthritis: He is doing well on methotrexate.  He had no synovitis on my examination today.  He denies any history of joint swelling.  He complains of discomfort in his bilateral first MCP joints.  No synovitis was noted.  He had bilateral CMC left subluxation which is probably contributing to the discomfort in his thumbs.  Labs obtained on January 29, 2022 were reviewed.  ANA was 1: 1280 NH, beta-2 GP 1 IgM> 112, anticardiolipin IgM> 112, complements normal, sed rate 22, dsDNA negative, UA was negative.  We will repeat antibodies with his next labs.  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  According to the patient he was advised to reduce dose of aspirin to 81 mg p.o. daily.  Patient has a follow-up appointment on May 08, 2022.  There is no history of arterial or venous thrombosis.  High risk medication use - Methotrexate 8 tablets by mouth once weekly, folic acid 2 mg by mouth daily.  - Plan: CBC with Differential/Platelet,  COMPLETE METABOLIC PANEL WITH GFR today and every 3 months.  Information on immunization was placed in the AVS.  He was advised to hold methotrexate if he develops an infection and resume after the infection resolves.  [name] disease without gangrene-currently not active.  Keeping core temperature warm and warm clothing was discussed.  Primary osteoarthritis of both hands-has been experiencing pain and discomfort in the bilateral CMC joints and MCP joints of the thumb.  No synovitis was noted.  A prescription for bilateral CMC braces was given.  Fibromyalgia-he continues to have some general pain and discomfort which is manageable.  Other medical problems are listed as follows:  History of iron deficiency anemia  Fuchs' corneal dystrophy, unspecified laterality  History of diverticulitis  Essential hypertension-blood pressure was normal today.  Hypercholesteremia  History of sleep apnea  History of multiple allergies  Orders: Orders Placed This Encounter  Procedures   CBC with Differential/Platelet   COMPLETE METABOLIC PANEL WITH GFR   Protein / creatinine ratio, urine   Anti-DNA antibody, double-stranded   ANA   C3 and C4   Sedimentation rate   Beta-2 glycoprotein antibodies   Cardiolipin antibodies, IgG, IgM, IgA   No orders of the defined types were placed in this encounter.    Follow-Up Instructions: Return in about 5 months (around 09/25/2022) for Autoimmune disease.   Bo Merino, MD  Note - This record has been created using Editor, commissioning.  Chart creation errors have been sought, but may not always  have been located. Such creation errors do not reflect on  the standard of medical care.

## 2022-04-19 DIAGNOSIS — J3089 Other allergic rhinitis: Secondary | ICD-10-CM | POA: Diagnosis not present

## 2022-04-19 DIAGNOSIS — J301 Allergic rhinitis due to pollen: Secondary | ICD-10-CM | POA: Diagnosis not present

## 2022-04-19 DIAGNOSIS — J3081 Allergic rhinitis due to animal (cat) (dog) hair and dander: Secondary | ICD-10-CM | POA: Diagnosis not present

## 2022-04-26 ENCOUNTER — Ambulatory Visit: Payer: Medicare HMO | Attending: Rheumatology | Admitting: Rheumatology

## 2022-04-26 ENCOUNTER — Encounter: Payer: Self-pay | Admitting: Rheumatology

## 2022-04-26 VITALS — BP 127/72 | HR 58 | Resp 16 | Ht 72.0 in | Wt 194.0 lb

## 2022-04-26 DIAGNOSIS — I7300 Raynaud's syndrome without gangrene: Secondary | ICD-10-CM | POA: Diagnosis not present

## 2022-04-26 DIAGNOSIS — E7800 Pure hypercholesterolemia, unspecified: Secondary | ICD-10-CM

## 2022-04-26 DIAGNOSIS — M19041 Primary osteoarthritis, right hand: Secondary | ICD-10-CM | POA: Diagnosis not present

## 2022-04-26 DIAGNOSIS — M19042 Primary osteoarthritis, left hand: Secondary | ICD-10-CM

## 2022-04-26 DIAGNOSIS — R760 Raised antibody titer: Secondary | ICD-10-CM

## 2022-04-26 DIAGNOSIS — M797 Fibromyalgia: Secondary | ICD-10-CM

## 2022-04-26 DIAGNOSIS — I10 Essential (primary) hypertension: Secondary | ICD-10-CM

## 2022-04-26 DIAGNOSIS — Z862 Personal history of diseases of the blood and blood-forming organs and certain disorders involving the immune mechanism: Secondary | ICD-10-CM | POA: Diagnosis not present

## 2022-04-26 DIAGNOSIS — M359 Systemic involvement of connective tissue, unspecified: Secondary | ICD-10-CM

## 2022-04-26 DIAGNOSIS — J3089 Other allergic rhinitis: Secondary | ICD-10-CM | POA: Diagnosis not present

## 2022-04-26 DIAGNOSIS — Z79899 Other long term (current) drug therapy: Secondary | ICD-10-CM | POA: Diagnosis not present

## 2022-04-26 DIAGNOSIS — J3081 Allergic rhinitis due to animal (cat) (dog) hair and dander: Secondary | ICD-10-CM | POA: Diagnosis not present

## 2022-04-26 DIAGNOSIS — H18519 Endothelial corneal dystrophy, unspecified eye: Secondary | ICD-10-CM

## 2022-04-26 DIAGNOSIS — M7061 Trochanteric bursitis, right hip: Secondary | ICD-10-CM

## 2022-04-26 DIAGNOSIS — J301 Allergic rhinitis due to pollen: Secondary | ICD-10-CM | POA: Diagnosis not present

## 2022-04-26 DIAGNOSIS — Z8719 Personal history of other diseases of the digestive system: Secondary | ICD-10-CM

## 2022-04-26 DIAGNOSIS — Z889 Allergy status to unspecified drugs, medicaments and biological substances status: Secondary | ICD-10-CM

## 2022-04-26 DIAGNOSIS — Z8669 Personal history of other diseases of the nervous system and sense organs: Secondary | ICD-10-CM

## 2022-04-26 LAB — CBC WITH DIFFERENTIAL/PLATELET
Absolute Monocytes: 744 cells/uL (ref 200–950)
Basophils Absolute: 121 cells/uL (ref 0–200)
Basophils Relative: 1.3 %
Eosinophils Absolute: 419 cells/uL (ref 15–500)
Eosinophils Relative: 4.5 %
HCT: 35.6 % — ABNORMAL LOW (ref 38.5–50.0)
Hemoglobin: 11.9 g/dL — ABNORMAL LOW (ref 13.2–17.1)
Lymphs Abs: 1628 cells/uL (ref 850–3900)
MCH: 29.2 pg (ref 27.0–33.0)
MCHC: 33.4 g/dL (ref 32.0–36.0)
MCV: 87.3 fL (ref 80.0–100.0)
MPV: 8.9 fL (ref 7.5–12.5)
Monocytes Relative: 8 %
Neutro Abs: 6389 cells/uL (ref 1500–7800)
Neutrophils Relative %: 68.7 %
Platelets: 298 10*3/uL (ref 140–400)
RBC: 4.08 10*6/uL — ABNORMAL LOW (ref 4.20–5.80)
RDW: 14.2 % (ref 11.0–15.0)
Total Lymphocyte: 17.5 %
WBC: 9.3 10*3/uL (ref 3.8–10.8)

## 2022-04-26 LAB — COMPLETE METABOLIC PANEL WITH GFR
AG Ratio: 1.3 (calc) (ref 1.0–2.5)
ALT: 16 U/L (ref 9–46)
AST: 13 U/L (ref 10–35)
Albumin: 3.9 g/dL (ref 3.6–5.1)
Alkaline phosphatase (APISO): 106 U/L (ref 35–144)
BUN: 20 mg/dL (ref 7–25)
CO2: 24 mmol/L (ref 20–32)
Calcium: 9.1 mg/dL (ref 8.6–10.3)
Chloride: 101 mmol/L (ref 98–110)
Creat: 0.88 mg/dL (ref 0.70–1.28)
Globulin: 2.9 g/dL (calc) (ref 1.9–3.7)
Glucose, Bld: 101 mg/dL — ABNORMAL HIGH (ref 65–99)
Potassium: 4.4 mmol/L (ref 3.5–5.3)
Sodium: 134 mmol/L — ABNORMAL LOW (ref 135–146)
Total Bilirubin: 0.4 mg/dL (ref 0.2–1.2)
Total Protein: 6.8 g/dL (ref 6.1–8.1)
eGFR: 88 mL/min/{1.73_m2} (ref >=60)

## 2022-04-26 NOTE — Patient Instructions (Signed)
Standing Labs We placed an order today for your standing lab work.   Please have your standing labs drawn in February and every 3 months  Please have your labs drawn 2 weeks prior to your appointment so that the provider can discuss your lab results at your appointment.  Please note that you may see your imaging and lab results in MyChart before we have reviewed them. We will contact you once all results are reviewed. Please allow our office up to 72 hours to thoroughly review all of the results before contacting the office for clarification of your results.  Lab hours are:   Monday through Thursday from 8:00 am -12:30 pm and 1:00 pm-5:00 pm and Friday from 8:00 am-12:00 pm.  Please be advised, all patients with office appointments requiring lab work will take precedent over walk-in lab work.   Labs are drawn by [facility]. Please bring your co-pay at the time of your lab draw.  You may receive a bill from [facility] for your lab work.  Please note if you are on Hydroxychloroquine and and an order has been placed for a Hydroxychloroquine level, you will need to have it drawn 4 hours or more after your last dose.  If you wish to have your labs drawn at another location, please call the office 24 hours in advance so we can fax the orders.  The office is located at [address], [address], [place], [place] [address] No appointment is necessary.    If you have any questions regarding directions or hours of operation,  please call [phone].   As a reminder, please drink plenty of water prior to coming for your lab work. Thanks!   Vaccines You are taking a medication(s) that can suppress your immune system.  The following immunizations are recommended: Flu annually Covid-19  Td/Tdap (tetanus, diphtheria, pertussis) every 10 years Pneumonia (Prevnar 15 then Pneumovax 23 at least 1 year apart.  Alternatively, can take Prevnar 20 without needing additional dose) Shingrix: 2 doses from 4 weeks  to 6 months apart  Please check with your PCP to make sure you are up to date.   If you have signs or symptoms of an infection or start antibiotics: First, call your PCP for workup of your infection. Hold your medication through the infection, until you complete your antibiotics, and until symptoms resolve if you take the following: Injectable medication (Actemra, Benlysta, Cimzia, Cosentyx, Enbrel, Humira, Kevzara, Orencia, Remicade, Simponi, Stelara, Taltz, Tremfya) Methotrexate Leflunomide (Arava) Mycophenolate (Cellcept) Xeljanz, Olumiant, or Rinvoq  

## 2022-04-27 NOTE — Progress Notes (Signed)
CBC and CMP are stable.  Hemoglobin is low at 11.9 which is also stable.

## 2022-04-30 DIAGNOSIS — J3089 Other allergic rhinitis: Secondary | ICD-10-CM | POA: Diagnosis not present

## 2022-04-30 DIAGNOSIS — J3081 Allergic rhinitis due to animal (cat) (dog) hair and dander: Secondary | ICD-10-CM | POA: Diagnosis not present

## 2022-04-30 DIAGNOSIS — J301 Allergic rhinitis due to pollen: Secondary | ICD-10-CM | POA: Diagnosis not present

## 2022-05-01 ENCOUNTER — Inpatient Hospital Stay: Payer: Medicare HMO | Attending: Hematology

## 2022-05-01 DIAGNOSIS — I10 Essential (primary) hypertension: Secondary | ICD-10-CM | POA: Insufficient documentation

## 2022-05-01 DIAGNOSIS — D6861 Antiphospholipid syndrome: Secondary | ICD-10-CM | POA: Insufficient documentation

## 2022-05-01 DIAGNOSIS — M797 Fibromyalgia: Secondary | ICD-10-CM | POA: Insufficient documentation

## 2022-05-01 DIAGNOSIS — M359 Systemic involvement of connective tissue, unspecified: Secondary | ICD-10-CM | POA: Insufficient documentation

## 2022-05-01 DIAGNOSIS — Z7982 Long term (current) use of aspirin: Secondary | ICD-10-CM | POA: Insufficient documentation

## 2022-05-01 DIAGNOSIS — Z79899 Other long term (current) drug therapy: Secondary | ICD-10-CM | POA: Insufficient documentation

## 2022-05-03 ENCOUNTER — Other Ambulatory Visit: Payer: Self-pay | Admitting: *Deleted

## 2022-05-03 DIAGNOSIS — R760 Raised antibody titer: Secondary | ICD-10-CM

## 2022-05-03 DIAGNOSIS — M359 Systemic involvement of connective tissue, unspecified: Secondary | ICD-10-CM

## 2022-05-04 ENCOUNTER — Other Ambulatory Visit (HOSPITAL_COMMUNITY)
Admission: RE | Admit: 2022-05-04 | Discharge: 2022-05-04 | Disposition: A | Discharge status: Home / Self Care | Payer: Medicare HMO | Source: Ambulatory Visit | Attending: Hematology | Admitting: Hematology

## 2022-05-04 ENCOUNTER — Other Ambulatory Visit: Payer: Medicare HMO

## 2022-05-04 DIAGNOSIS — R760 Raised antibody titer: Secondary | ICD-10-CM | POA: Insufficient documentation

## 2022-05-04 DIAGNOSIS — H353211 Exudative age-related macular degeneration, right eye, with active choroidal neovascularization: Secondary | ICD-10-CM | POA: Diagnosis not present

## 2022-05-04 DIAGNOSIS — M359 Systemic involvement of connective tissue, unspecified: Secondary | ICD-10-CM | POA: Diagnosis not present

## 2022-05-04 DIAGNOSIS — H353122 Nonexudative age-related macular degeneration, left eye, intermediate dry stage: Secondary | ICD-10-CM | POA: Diagnosis not present

## 2022-05-04 LAB — D-DIMER, QUANTITATIVE: D-Dimer, Quant: 0.55 ug/mL-FEU — ABNORMAL HIGH (ref 0.00–0.50)

## 2022-05-07 NOTE — Progress Notes (Deleted)
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  He was last seen by Dr. Delton Coombes on 05/02/2021.  At today's visit, he reports feeling ***.  No recent hospitalizations, surgeries, or changes in baseline health status.  *** He denies any thrombotic events in the last year. *** He continues to have generalized joint pains from his arthritis, which is stable. *** He denies any current bleeding or easy bruising. *** No chest pain, shortness of breath, or unilateral peripheral edema. *** He continues to take aspirin 325 mg daily.  He has ***% energy and ***% appetite. He endorses that he is maintaining a stable weight.   REVIEW OF SYSTEMS: *** Review of Systems - Oncology    PAST MEDICAL/SURGICAL HISTORY:  Past Medical History:  Diagnosis Date   Diverticula of colon    2016 colonoscopy   Fibromyalgia    Fibromyalgia    History of GI diverticular bleed    Hypercholesteremia    Hypertension    Past Surgical History:  Procedure Laterality Date   allergy shots  weekly   CHOLECYSTECTOMY     COLONOSCOPY     Dr.Rehman   COLONOSCOPY N/A 01/27/2015   Rehman: multiple diverticula at sigmoid colon,ext hemorrhoids   EYE SURGERY Bilateral    partial cornea transplants    GALLBLADDER SURGERY  12/1993   UPPER GASTROINTESTINAL ENDOSCOPY       SOCIAL HISTORY:  Social History   Socioeconomic History   Marital status: Married    Spouse name: Not on file   Number of children: Not on file   Years of education: Not on file   Highest education level: Not on file   Occupational History   Not on file  Tobacco Use   Smoking status: Never    Passive exposure: Never   Smokeless tobacco: Never  Vaping Use   Vaping Use: Never used  Substance and Sexual Activity   Alcohol use: Yes    Alcohol/week: 0.0 standard drinks of alcohol    Comment: occas   Drug use: No   Sexual activity: Not on file  Other Topics Concern   Not on file  Social History Narrative   Not on file   Social Determinants of Health   Financial Resource Strain: Not on file  Food Insecurity: Not on file  Transportation Needs: Not on file  Physical Activity: Not on file  Stress: Not on file  Social Connections: Not on file  Intimate Partner Violence: Not on file    FAMILY HISTORY:  Family History  Problem Relation Age of Onset   Heart disease Mother    Pancreatic cancer Mother    Prostate cancer Father    Healthy Sister    Parkinson's disease Brother    Asthma Sister    Healthy Sister     CURRENT MEDICATIONS:  Outpatient Encounter Medications as of 05/08/2022  Medication Sig Note   albuterol (VENTOLIN HFA) 108 (90 Base) MCG/ACT inhaler Inhale 1 puff into the lungs as needed.    amLODipine (NORVASC) 5 MG tablet Take 5 mg by mouth daily.    aspirin 81 MG EC tablet Take 1  tablet (81 mg total) by mouth daily.    atenolol (TENORMIN) 25 MG tablet Take 0.5 tablets (12.5 mg total) by mouth daily.    azelastine (ASTELIN) 0.1 % nasal spray Place 1 spray into both nostrils 2 (two) times daily.    Cholecalciferol (VITAMIN D) 50 MCG (2000 UT) CAPS Take 2,000 Units by mouth daily. 03/30/2021: 25 mcg    clobetasol (OLUX) 0.05 % topical foam Apply topically 2 (two) times daily. (Patient not taking: Reported on 04/26/2022)    clobetasol (TEMOVATE) 0.05 % external solution Apply 1 application topically 2 (two) times daily. (Patient not taking: Reported on 04/26/2022)    dicyclomine (BENTYL) 10 MG capsule Take 1 capsule (10 mg total) by mouth daily before breakfast.    diphenhydrAMINE  (BENADRYL) 25 MG tablet Take 25 mg by mouth as needed for allergies.     Doxepin HCl (ZONALON) 5 % CREA Apply 1 application topically daily. (Patient not taking: Reported on 12/04/2021)    EPINEPHrine 0.3 mg/0.3 mL IJ SOAJ injection INJECT INTO THIGH ASONEEDED FOR ALLERGIC REACTION.    famotidine (PEPCID) 20 MG tablet Take 20 mg by mouth daily.    FLUoxetine (PROZAC) 10 MG tablet Take 10 mg by mouth daily.    fluticasone (FLONASE) 50 MCG/ACT nasal spray SMARTSIG:1-2 Spray(s) Both Nares Daily    folic acid (FOLVITE) 1 MG tablet TAKE 2 TABLETS BY MOUTH DAILY.    hydrocortisone (ANUSOL-HC) 2.5 % rectal cream Place 1 application rectally 2 (two) times daily.    hydrOXYzine (ATARAX/VISTARIL) 25 MG tablet Take 25 mg by mouth every 8 (eight) hours as needed.    Investigational - Study Medication Study name: atorvastatin '40mg'$  or placebo.    levocetirizine (XYZAL) 5 MG tablet Take 5 mg by mouth every evening.     methotrexate (RHEUMATREX) 2.5 MG tablet TAKE 8 TABLETS BY MOUTH ONCE WEEKLY.    montelukast (SINGULAIR) 10 MG tablet Take 10 mg by mouth at bedtime.    Multiple Vitamins-Minerals (ICAPS AREDS 2 PO) Take by mouth.    naproxen sodium (ALEVE) 220 MG tablet Take 220 mg by mouth daily as needed.    Omega-3 Fatty Acids (FISH OIL) 1000 MG CAPS Take 1 capsule by mouth 2 (two) times daily. 03/30/2021: 1200 mg    Testosterone (ANDROGEL TD) Place onto the skin daily. 03/30/2021: 10% gel   triazolam (HALCION) 0.25 MG tablet Take 0.25 mg by mouth at bedtime.     No facility-administered encounter medications on file as of 05/08/2022.    ALLERGIES:  Allergies  Allergen Reactions   Plaquenil [Hydroxychloroquine Sulfate]     rash   Lodine [Etodolac] Swelling and Rash     PHYSICAL EXAM:  ECOG PERFORMANCE STATUS: {CHL ONC ECOG WE:[id number]} *** There were no vitals filed for this visit. There were no vitals filed for this visit. Physical Exam   LABORATORY DATA:  I have reviewed the labs as  listed.  CBC    Component Value Date/Time   WBC 9.3 04/26/2022 1514   RBC 4.08 (L) 04/26/2022 1514   HGB 11.9 (L) 04/26/2022 1514   HGB 12.0 (L) 10/09/2021 1455   HCT 35.6 (L) 04/26/2022 1514   HCT 36.5 (L) 10/09/2021 1455   PLT 298 04/26/2022 1514   PLT 229 10/09/2021 1455   MCV 87.3 04/26/2022 1514   MCV 88 10/09/2021 1455   MCH 29.2 04/26/2022 1514   MCHC 33.4 04/26/2022 1514   RDW 14.2 04/26/2022 1514   RDW 16.4 (H) 10/09/2021 1455   LYMPHSABS  1,628 04/26/2022 1514   LYMPHSABS 1.4 10/09/2021 1455   MONOABS 0.3 04/26/2021 1445   EOSABS 419 04/26/2022 1514   EOSABS 0.3 10/09/2021 1455   BASOSABS 121 04/26/2022 1514   BASOSABS 0.1 10/09/2021 1455      Latest Ref Rng & Units 04/26/2022    3:14 PM 01/29/2022   10:37 AM 10/09/2021    2:55 PM  CMP  Glucose 65 - 99 mg/dL 101  124  92   BUN 7 - 25 mg/dL '20  19  18   '$ Creatinine 0.70 - 1.28 mg/dL 0.88  1.00  1.03   Sodium 135 - 146 mmol/L 134  131  137   Potassium 3.5 - 5.3 mmol/L 4.4  4.3  4.1   Chloride 98 - 110 mmol/L 101  99  101   CO2 20 - 32 mmol/L '24  23  23   '$ Calcium 8.6 - 10.3 mg/dL 9.1  8.9  9.1   Total Protein 6.1 - 8.1 g/dL 6.8  6.5  6.6   Total Bilirubin 0.2 - 1.2 mg/dL 0.4  0.5  0.4   Alkaline Phos 44 - 121 IU/L   138   AST 10 - 35 U/L '13  11  15   '$ ALT 9 - 46 U/L '16  13  12     '$ DIAGNOSTIC IMAGING:  I have independently reviewed the relevant imaging and discussed with the patient.  ASSESSMENT & PLAN: 1.  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  He has intermittent [name] phenomena. - Currently being treated with methotrexate 8 tablets/week *** under the direction of Dr. Estanislado Pandy.   - Patient had positive antibeta-2 glycoprotein 1 IgM antibody (greater than 112) twice in March and July of this year.  He also has anticardiolipin antibody IgM positive (greater than 112) on 2 different  occasions at least 3 months apart.  Lupus anticoagulant was negative twice. - He does not have any history of arterial or venous thrombosis.  No history of strokes. - No evidence of renal involvement or lung involvement.  Echocardiogram in 2018 did not show any major valvular involvement. - He has mild erythematous maculopapular rash about the medial ankles. - Patients with positive antiphospholipid antibody and other connective tissue disorders are at increased risk for thrombosis based on several studies, but full dose anticoagulation is not indicated in the absence of prior thrombotic events. - D-dimer mildly positive at 0.55 (05/04/2022), likely elevated from autoimmune disease as patient does not have any signs or symptoms of DVT or PE at this time.  *** - PLAN: Continue aspirin 325 mg daily for primary prophylaxis. - We have discussed symptoms of DVT/PE that would warrant immediate medical evaluation. - RTC in 1 year for follow-up visit with repeat D-dimer.  2.  Social/family history: - He used to work in [employer] prior to retirement.  Lives at home with his wife.  Non-smoker. - No family history of lupus anticoagulant or miscarriages or DVTs.  Father had prostate cancer and mother had pancreatic cancer.  Several paternal aunts had cancer, unknown type to the patient.   PLAN SUMMARY: >> Same-day labs (D-dimer) and office visit in 1 year   All questions were answered. The patient knows to call the clinic with any problems, questions or concerns.  Medical decision making: ***  Time spent on visit: I spent *** minutes counseling the patient face to face. The total time spent in the  appointment was *** minutes and more than 50% was on counseling.   Harriett Rush, PA-C  ***

## 2022-05-08 ENCOUNTER — Inpatient Hospital Stay: Payer: Medicare HMO | Admitting: Physician Assistant

## 2022-05-08 DIAGNOSIS — J3089 Other allergic rhinitis: Secondary | ICD-10-CM | POA: Diagnosis not present

## 2022-05-08 DIAGNOSIS — J3081 Allergic rhinitis due to animal (cat) (dog) hair and dander: Secondary | ICD-10-CM | POA: Diagnosis not present

## 2022-05-08 DIAGNOSIS — J301 Allergic rhinitis due to pollen: Secondary | ICD-10-CM | POA: Diagnosis not present

## 2022-05-14 DIAGNOSIS — J3081 Allergic rhinitis due to animal (cat) (dog) hair and dander: Secondary | ICD-10-CM | POA: Diagnosis not present

## 2022-05-14 DIAGNOSIS — J301 Allergic rhinitis due to pollen: Secondary | ICD-10-CM | POA: Diagnosis not present

## 2022-05-14 DIAGNOSIS — J3089 Other allergic rhinitis: Secondary | ICD-10-CM | POA: Diagnosis not present

## 2022-05-14 NOTE — Progress Notes (Unsigned)
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 76 y.o. 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  He was last seen by Dr. Delton Coombes on 05/02/2021.   At today's visit, he reports feeling well.  No recent hospitalizations, surgeries, or changes in baseline health status.  He denies any thrombotic events in the last year. No history of heart attack or stroke.  He continues to have generalized joint pains from his arthritis and fibromyalgia, which is stable.  No chest pain, shortness of breath, or unilateral peripheral edema.  He continues to take aspirin 325 mg daily. He reports easy bruising, but denies any current bleeding.    He has 100% energy and 100% appetite. He endorses that he is maintaining a stable weight.   REVIEW OF SYSTEMS:  Review of Systems  Constitutional:  Negative for appetite change, chills, diaphoresis, fatigue, fever and unexpected weight change.  HENT:   Negative for lump/mass and nosebleeds.   Eyes:  Negative for eye problems.  Respiratory:  Negative for cough, hemoptysis and shortness of breath.   Cardiovascular:  Negative for chest pain, leg swelling and palpitations.  Gastrointestinal:  Negative for abdominal pain, blood in stool, constipation, diarrhea, nausea and vomiting.  Genitourinary:  Negative for hematuria.   Musculoskeletal:  Positive for arthralgias and myalgias.  Skin: Negative.   Neurological:  Negative for dizziness, headaches and light-headedness.  Hematological:  Does not bruise/bleed easily.      PAST MEDICAL/SURGICAL HISTORY:  Past Medical History:  Diagnosis Date    Diverticula of colon    2016 colonoscopy   Fibromyalgia    Fibromyalgia    History of GI diverticular bleed    Hypercholesteremia    Hypertension    Past Surgical History:  Procedure Laterality Date   allergy shots  weekly   CHOLECYSTECTOMY     COLONOSCOPY     Dr.Rehman   COLONOSCOPY N/A 01/27/2015   Rehman: multiple diverticula at sigmoid colon,ext hemorrhoids   EYE SURGERY Bilateral    partial cornea transplants    GALLBLADDER SURGERY  12/1993   UPPER GASTROINTESTINAL ENDOSCOPY       SOCIAL HISTORY:  Social History   Socioeconomic History   Marital status: Married    Spouse name: Not on file   Number of children: Not on file   Years of education: Not on file   Highest education level: Not on file  Occupational History   Not on file  Tobacco Use   Smoking status: Never    Passive exposure: Never   Smokeless tobacco: Never  Vaping Use   Vaping Use: Never used  Substance and Sexual Activity   Alcohol use: Yes    Alcohol/week: 0.0 standard drinks of alcohol    Comment: occas   Drug use: No   Sexual activity: Not on file  Other Topics Concern   Not on file  Social History Narrative   Not on file   Social Determinants of Health   Financial Resource Strain: Not on file  Food Insecurity: Not on file  Transportation Needs: Not on file  Physical Activity: Not on file  Stress: Not on file  Social Connections: Not on file  Intimate Partner Violence: Not on file    FAMILY HISTORY:  Family History  Problem Relation Age of Onset   Heart disease Mother    Pancreatic cancer Mother    Prostate cancer Father    Healthy Sister    Parkinson's disease Brother    Asthma Sister    Healthy Sister     CURRENT MEDICATIONS:  Outpatient Encounter Medications as of 05/15/2022  Medication Sig Note   albuterol (VENTOLIN HFA) 108 (90 Base) MCG/ACT inhaler Inhale 1 puff into the lungs as needed.    amLODipine (NORVASC) 5 MG tablet Take 5 mg by mouth daily.    aspirin 81  MG EC tablet Take 1 tablet (81 mg total) by mouth daily.    atenolol (TENORMIN) 25 MG tablet Take 0.5 tablets (12.5 mg total) by mouth daily.    azelastine (ASTELIN) 0.1 % nasal spray Place 1 spray into both nostrils 2 (two) times daily.    Cholecalciferol (VITAMIN D) 50 MCG (2000 UT) CAPS Take 2,000 Units by mouth daily. 03/30/2021: 25 mcg    clobetasol (OLUX) 0.05 % topical foam Apply topically 2 (two) times daily. (Patient not taking: Reported on 04/26/2022)    clobetasol (TEMOVATE) 0.05 % external solution Apply 1 application topically 2 (two) times daily. (Patient not taking: Reported on 04/26/2022)    dicyclomine (BENTYL) 10 MG capsule Take 1 capsule (10 mg total) by mouth daily before breakfast.    diphenhydrAMINE (BENADRYL) 25 MG tablet Take 25 mg by mouth as needed for allergies.     Doxepin HCl (ZONALON) 5 % CREA Apply 1 application topically daily. (Patient not taking: Reported on 12/04/2021)    EPINEPHrine 0.3 mg/0.3 mL IJ SOAJ injection INJECT INTO THIGH ASONEEDED FOR ALLERGIC REACTION.    famotidine (PEPCID) 20 MG tablet Take 20 mg by mouth daily.    FLUoxetine (PROZAC) 10 MG tablet Take 10 mg by mouth daily.    fluticasone (FLONASE) 50 MCG/ACT nasal spray SMARTSIG:1-2 Spray(s) Both Nares Daily    folic acid (FOLVITE) 1 MG tablet TAKE 2 TABLETS BY MOUTH DAILY.    hydrocortisone (ANUSOL-HC) 2.5 % rectal cream Place 1 application rectally 2 (two) times daily.    hydrOXYzine (ATARAX/VISTARIL) 25 MG tablet Take 25 mg by mouth every 8 (eight) hours as needed.    Investigational - Study Medication Study name: atorvastatin '40mg'$  or placebo.    levocetirizine (XYZAL) 5 MG tablet Take 5 mg by mouth every evening.     methotrexate (RHEUMATREX) 2.5 MG tablet TAKE 8 TABLETS BY MOUTH ONCE WEEKLY.    montelukast (SINGULAIR) 10 MG tablet Take 10 mg by mouth at bedtime.    Multiple Vitamins-Minerals (ICAPS AREDS 2 PO) Take by mouth.    naproxen sodium (ALEVE) 220 MG tablet Take 220 mg by mouth daily  as needed.    Omega-3 Fatty Acids (FISH OIL) 1000 MG CAPS Take 1 capsule by mouth 2 (two) times daily. 03/30/2021: 1200 mg    Testosterone (ANDROGEL TD) Place onto the skin daily. 03/30/2021: 10% gel   triazolam (HALCION) 0.25 MG tablet Take 0.25 mg by mouth at bedtime.     No facility-administered encounter medications on file as of 05/15/2022.    ALLERGIES:  Allergies  Allergen Reactions   Plaquenil [Hydroxychloroquine Sulfate]     rash   Lodine [Etodolac] Swelling and Rash     PHYSICAL EXAM:  ECOG PERFORMANCE STATUS: 0 - Asymptomatic  There were no vitals filed for this visit. There were no vitals filed for this  visit. Physical Exam Constitutional:      Appearance: Normal appearance.  HENT:     Head: Normocephalic and atraumatic.     Mouth/Throat:     Mouth: Mucous membranes are moist.  Eyes:     Extraocular Movements: Extraocular movements intact.     Pupils: Pupils are equal, round, and reactive to light.  Cardiovascular:     Rate and Rhythm: Normal rate and regular rhythm.     Pulses: Normal pulses.     Heart sounds: Normal heart sounds.  Pulmonary:     Effort: Pulmonary effort is normal.     Breath sounds: Normal breath sounds.  Abdominal:     General: Bowel sounds are normal.     Palpations: Abdomen is soft.     Tenderness: There is no abdominal tenderness.  Musculoskeletal:        General: No swelling.     Right lower leg: No edema.     Left lower leg: No edema.  Lymphadenopathy:     Cervical: No cervical adenopathy.  Skin:    General: Skin is warm and dry.     Findings: Bruising (mild bruising on extremities) present.  Neurological:     General: No focal deficit present.     Mental Status: He is alert and oriented to person, place, and time.  Psychiatric:        Mood and Affect: Mood normal.        Behavior: Behavior normal.      LABORATORY DATA:  I have reviewed the labs as listed.  CBC    Component Value Date/Time   WBC 9.3 04/26/2022  1514   RBC 4.08 (L) 04/26/2022 1514   HGB 11.9 (L) 04/26/2022 1514   HGB 12.0 (L) 10/09/2021 1455   HCT 35.6 (L) 04/26/2022 1514   HCT 36.5 (L) 10/09/2021 1455   PLT 298 04/26/2022 1514   PLT 229 10/09/2021 1455   MCV 87.3 04/26/2022 1514   MCV 88 10/09/2021 1455   MCH 29.2 04/26/2022 1514   MCHC 33.4 04/26/2022 1514   RDW 14.2 04/26/2022 1514   RDW 16.4 (H) 10/09/2021 1455   LYMPHSABS 1,628 04/26/2022 1514   LYMPHSABS 1.4 10/09/2021 1455   MONOABS 0.3 04/26/2021 1445   EOSABS 419 04/26/2022 1514   EOSABS 0.3 10/09/2021 1455   BASOSABS 121 04/26/2022 1514   BASOSABS 0.1 10/09/2021 1455      Latest Ref Rng & Units 04/26/2022    3:14 PM 01/29/2022   10:37 AM 10/09/2021    2:55 PM  CMP  Glucose 65 - 99 mg/dL 101  124  92   BUN 7 - 25 mg/dL '20  19  18   '$ Creatinine 0.70 - 1.28 mg/dL 0.88  1.00  1.03   Sodium 135 - 146 mmol/L 134  131  137   Potassium 3.5 - 5.3 mmol/L 4.4  4.3  4.1   Chloride 98 - 110 mmol/L 101  99  101   CO2 20 - 32 mmol/L '24  23  23   '$ Calcium 8.6 - 10.3 mg/dL 9.1  8.9  9.1   Total Protein 6.1 - 8.1 g/dL 6.8  6.5  6.6   Total Bilirubin 0.2 - 1.2 mg/dL 0.4  0.5  0.4   Alkaline Phos 44 - 121 IU/L   138   AST 10 - 35 U/L '13  11  15   '$ ALT 9 - 46 U/L '16  13  12     '$ DIAGNOSTIC IMAGING:  I have independently reviewed the relevant imaging and discussed with the patient.  ASSESSMENT & PLAN: 1.  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  He has intermittent [name] phenomena. - Currently being treated with methotrexate 8 tablets/week under the direction of Dr. Estanislado Pandy.   - Patient had positive antibeta-2 glycoprotein 1 IgM antibody (greater than 112) twice in March and July of this year.  He also has anticardiolipin antibody IgM positive (greater than 112) on 2 different occasions at least 3 months apart.  Lupus anticoagulant was negative  twice. - He does not have any history of arterial or venous thrombosis.  No history of strokes. - No evidence of renal involvement or lung involvement.  Echocardiogram in 2018 did not show any major valvular involvement. - He has mild erythematous maculopapular rash about the medial ankles. - Patients with positive antiphospholipid antibody and other connective tissue disorders are at increased risk for thrombosis based on several studies, but full dose anticoagulation is not indicated in the absence of prior thrombotic events. - D-dimer mildly positive at 0.55 (05/04/2022), likely elevated from autoimmune disease as patient does not have any signs or symptoms of DVT or PE at this time.   - PLAN: Continue aspirin 325 mg daily for primary prophylaxis. - We have discussed symptoms of DVT/PE that would warrant immediate medical evaluation. - Extensive discussion with patient regarding his antiphospholipid antibodies.  We discussed that although he does not meet diagnostic criteria for APS, his positive antibodies place him at slightly increased risk of blood clots and strokes.  He should remain indefinitely on aspirin for primary prophylaxis of thrombotic events.  However, he does not require ongoing hematology follow-up unless he has any future thrombotic events.  Discussed with patient that we could either discharge him to his PCP (for return as needed) or continue to follow-up with him on an annual basis at the hematology clinic.  He opts for tentative discharge to PCP, but verbalizes understanding that he would need to return to our clinic for further evaluation and treatment if he had any DVT, PE, or CVA.   2.  Social/family history: - He used to work in [employer] prior to retirement.  Lives at home with his wife.  Non-smoker. - No family history of lupus anticoagulant or miscarriages or DVTs.  Father had prostate cancer and mother had pancreatic cancer.  Several paternal aunts had cancer, unknown  type to the patient.  All questions were answered. The patient knows to call the clinic with any problems, questions or concerns.  Medical decision making: Low  Time spent on visit: I spent 15 minutes counseling the patient face to face. The total time spent in the appointment was 22 minutes and more than 50% was on counseling.   Harriett Rush, PA-C  05/15/2022 4:56 PM

## 2022-05-15 ENCOUNTER — Inpatient Hospital Stay (HOSPITAL_BASED_OUTPATIENT_CLINIC_OR_DEPARTMENT_OTHER): Payer: Medicare HMO | Admitting: Physician Assistant

## 2022-05-15 ENCOUNTER — Encounter: Payer: Self-pay | Admitting: Physician Assistant

## 2022-05-15 VITALS — BP 154/91 | HR 61 | Temp 97.6°F | Resp 16 | Wt 196.4 lb

## 2022-05-15 DIAGNOSIS — M359 Systemic involvement of connective tissue, unspecified: Secondary | ICD-10-CM | POA: Diagnosis not present

## 2022-05-15 DIAGNOSIS — R760 Raised antibody titer: Secondary | ICD-10-CM | POA: Diagnosis not present

## 2022-05-15 DIAGNOSIS — M797 Fibromyalgia: Secondary | ICD-10-CM | POA: Diagnosis not present

## 2022-05-15 DIAGNOSIS — Z79899 Other long term (current) drug therapy: Secondary | ICD-10-CM | POA: Diagnosis not present

## 2022-05-15 DIAGNOSIS — I10 Essential (primary) hypertension: Secondary | ICD-10-CM | POA: Diagnosis not present

## 2022-05-15 DIAGNOSIS — D6861 Antiphospholipid syndrome: Secondary | ICD-10-CM | POA: Diagnosis not present

## 2022-05-15 DIAGNOSIS — Z7982 Long term (current) use of aspirin: Secondary | ICD-10-CM | POA: Diagnosis not present

## 2022-05-15 NOTE — Patient Instructions (Signed)
[place] [facility] [place] **   You were seen today by Tarri Abernethy PA-C for your history of abnormal antiphospholipid antibodies.    ANTIPHOSPHOLIPID ANTIBODIES Your lab tests in the past have shown the presence of antiphospholipid antibodies (anticardiolipin and antibeta-2-glycoprotein antibodies). These antibodies put you at a slightly increased risk of blood clots and stroke. If you had had any blood clots in the past, you would meet the criteria for a condition called "antiphospholipid antibody syndrome," and would need to be on a blood thinner for the rest of your life. However, since you have not had any history of blood clots in the past, you do not technically meet the criteria for antiphospholipid antibody syndrome. You are still at a slightly increased risk of blood clots compared to the general population. It is recommended that you remain on aspirin 81 mg daily to decrease your risk of blood clots. You do not need to continue to follow-up at the hematology clinic [facility]). However, if you have any blood clots in the future, you should be referred back to [place] for further recommendations.  RISK OF BLOOD CLOTS: Although you do not have any history of blood clots, you are at increased risk of blood clots.  Please be familiar with symptoms of blood clots and seek immediate attention if any of the following symptoms occur. DVT (blood clot in leg): New onset swelling in one leg, but not the other.  May or may not be associated with redness and pain. PULMONARY EMBOLISM (blood clot in the lungs): Sudden onset chest pain or difficulty breathing.  May or may not be associated with rapid heartbeat and coughing up blood. STROKE: Sudden changes in your ability to move or communicate.  This could include symptoms such as facial droop, sudden blurry vision, slurred speech, or weakness on one side of your body.  FOLLOW-UP  APPOINTMENT: Continue to follow-up with your primary care doctor.  You can be referred back to [place] in the future if needed.  ** Thank you for trusting me with your healthcare!  I strive to provide all of my patients with quality care at each visit.  If you receive a survey for this visit, I would be so grateful to you for taking the time to provide feedback.  Thank you in advance!  ~ Flara Storti                   Dr. Derek Jack   &   Tarri Abernethy, PA-C   - - - - - - - - - - - - - - - - - -    Thank you for choosing [place] at [facility] to provide your oncology and hematology care.  To afford each patient quality time with our provider, please arrive at least 15 minutes before your scheduled appointment time.   If you have a lab appointment with the [facility] please come in thru the Main Entrance and check in at the main information desk.  You need to re-schedule your appointment should you arrive 10 or more minutes late.  We strive to give you quality time with our providers, and arriving late affects you and other patients whose appointments are after yours.  Also, if you no show three or more times for appointments you may be dismissed from the clinic at the providers discretion.     Again, thank you for choosing [facility].  Our hope is that these requests will decrease the amount of time that you wait before being seen by our physicians.       _____________________________________________________________  Should you have questions after your visit to [facility], please contact our office at [phone] and follow the prompts.  Our office hours are 8:00 a.m. and 4:30 p.m. Monday - Friday.  Please note that voicemails left after 4:00 p.m. may not be returned until the following business day.  We are closed weekends and major holidays.  You do have access to a nurse 24-7, just call the main number to the clinic [phone]  and do not press any options, hold on the line and a nurse will answer the phone.    For prescription refill requests, have your pharmacy contact our office and allow 72 hours.

## 2022-05-18 DIAGNOSIS — J3081 Allergic rhinitis due to animal (cat) (dog) hair and dander: Secondary | ICD-10-CM | POA: Diagnosis not present

## 2022-05-18 DIAGNOSIS — R052 Subacute cough: Secondary | ICD-10-CM | POA: Diagnosis not present

## 2022-05-18 DIAGNOSIS — J3089 Other allergic rhinitis: Secondary | ICD-10-CM | POA: Diagnosis not present

## 2022-05-18 DIAGNOSIS — L299 Pruritus, unspecified: Secondary | ICD-10-CM | POA: Diagnosis not present

## 2022-05-18 DIAGNOSIS — J301 Allergic rhinitis due to pollen: Secondary | ICD-10-CM | POA: Diagnosis not present

## 2022-05-23 DIAGNOSIS — J3081 Allergic rhinitis due to animal (cat) (dog) hair and dander: Secondary | ICD-10-CM | POA: Diagnosis not present

## 2022-05-23 DIAGNOSIS — J301 Allergic rhinitis due to pollen: Secondary | ICD-10-CM | POA: Diagnosis not present

## 2022-05-23 DIAGNOSIS — J3089 Other allergic rhinitis: Secondary | ICD-10-CM | POA: Diagnosis not present

## 2022-06-01 DIAGNOSIS — H353122 Nonexudative age-related macular degeneration, left eye, intermediate dry stage: Secondary | ICD-10-CM | POA: Diagnosis not present

## 2022-06-01 DIAGNOSIS — J301 Allergic rhinitis due to pollen: Secondary | ICD-10-CM | POA: Diagnosis not present

## 2022-06-01 DIAGNOSIS — J3081 Allergic rhinitis due to animal (cat) (dog) hair and dander: Secondary | ICD-10-CM | POA: Diagnosis not present

## 2022-06-01 DIAGNOSIS — J3089 Other allergic rhinitis: Secondary | ICD-10-CM | POA: Diagnosis not present

## 2022-06-01 DIAGNOSIS — H35033 Hypertensive retinopathy, bilateral: Secondary | ICD-10-CM | POA: Diagnosis not present

## 2022-06-01 DIAGNOSIS — H43813 Vitreous degeneration, bilateral: Secondary | ICD-10-CM | POA: Diagnosis not present

## 2022-06-01 DIAGNOSIS — H353211 Exudative age-related macular degeneration, right eye, with active choroidal neovascularization: Secondary | ICD-10-CM | POA: Diagnosis not present

## 2022-06-05 DIAGNOSIS — J3081 Allergic rhinitis due to animal (cat) (dog) hair and dander: Secondary | ICD-10-CM | POA: Diagnosis not present

## 2022-06-05 DIAGNOSIS — J3089 Other allergic rhinitis: Secondary | ICD-10-CM | POA: Diagnosis not present

## 2022-06-05 DIAGNOSIS — J301 Allergic rhinitis due to pollen: Secondary | ICD-10-CM | POA: Diagnosis not present

## 2022-06-13 DIAGNOSIS — J301 Allergic rhinitis due to pollen: Secondary | ICD-10-CM | POA: Diagnosis not present

## 2022-06-13 DIAGNOSIS — J3081 Allergic rhinitis due to animal (cat) (dog) hair and dander: Secondary | ICD-10-CM | POA: Diagnosis not present

## 2022-06-13 DIAGNOSIS — J3089 Other allergic rhinitis: Secondary | ICD-10-CM | POA: Diagnosis not present

## 2022-06-21 DIAGNOSIS — L570 Actinic keratosis: Secondary | ICD-10-CM | POA: Diagnosis not present

## 2022-06-21 DIAGNOSIS — L578 Other skin changes due to chronic exposure to nonionizing radiation: Secondary | ICD-10-CM | POA: Diagnosis not present

## 2022-06-22 DIAGNOSIS — J3089 Other allergic rhinitis: Secondary | ICD-10-CM | POA: Diagnosis not present

## 2022-06-22 DIAGNOSIS — J301 Allergic rhinitis due to pollen: Secondary | ICD-10-CM | POA: Diagnosis not present

## 2022-06-22 DIAGNOSIS — J3081 Allergic rhinitis due to animal (cat) (dog) hair and dander: Secondary | ICD-10-CM | POA: Diagnosis not present

## 2022-06-29 DIAGNOSIS — H353211 Exudative age-related macular degeneration, right eye, with active choroidal neovascularization: Secondary | ICD-10-CM | POA: Diagnosis not present

## 2022-06-29 DIAGNOSIS — J3089 Other allergic rhinitis: Secondary | ICD-10-CM | POA: Diagnosis not present

## 2022-06-29 DIAGNOSIS — J3081 Allergic rhinitis due to animal (cat) (dog) hair and dander: Secondary | ICD-10-CM | POA: Diagnosis not present

## 2022-06-29 DIAGNOSIS — J301 Allergic rhinitis due to pollen: Secondary | ICD-10-CM | POA: Diagnosis not present

## 2022-07-13 DIAGNOSIS — J3081 Allergic rhinitis due to animal (cat) (dog) hair and dander: Secondary | ICD-10-CM | POA: Diagnosis not present

## 2022-07-13 DIAGNOSIS — J301 Allergic rhinitis due to pollen: Secondary | ICD-10-CM | POA: Diagnosis not present

## 2022-07-13 DIAGNOSIS — J3089 Other allergic rhinitis: Secondary | ICD-10-CM | POA: Diagnosis not present

## 2022-07-17 ENCOUNTER — Other Ambulatory Visit: Payer: Self-pay | Admitting: Physician Assistant

## 2022-07-17 DIAGNOSIS — M359 Systemic involvement of connective tissue, unspecified: Secondary | ICD-10-CM

## 2022-07-17 NOTE — Telephone Encounter (Signed)
Next Visit: 10/25/2022  Last Visit: 04/26/2022  Last Fill: 02/21/2022  DX: Autoimmune disease   Current Dose per office note on 04/26/2022: Methotrexate 8 tablets by mouth once weekly   Labs: 04/26/2022 CBC and CMP are stable.  Hemoglobin is low at 11.9 which is also stable.   Okay to refill MTX?

## 2022-07-18 DIAGNOSIS — E663 Overweight: Secondary | ICD-10-CM | POA: Diagnosis not present

## 2022-07-18 DIAGNOSIS — Z6826 Body mass index (BMI) 26.0-26.9, adult: Secondary | ICD-10-CM | POA: Diagnosis not present

## 2022-07-18 DIAGNOSIS — G4700 Insomnia, unspecified: Secondary | ICD-10-CM | POA: Diagnosis not present

## 2022-07-27 DIAGNOSIS — J3089 Other allergic rhinitis: Secondary | ICD-10-CM | POA: Diagnosis not present

## 2022-07-27 DIAGNOSIS — H353211 Exudative age-related macular degeneration, right eye, with active choroidal neovascularization: Secondary | ICD-10-CM | POA: Diagnosis not present

## 2022-07-27 DIAGNOSIS — J3081 Allergic rhinitis due to animal (cat) (dog) hair and dander: Secondary | ICD-10-CM | POA: Diagnosis not present

## 2022-07-27 DIAGNOSIS — J301 Allergic rhinitis due to pollen: Secondary | ICD-10-CM | POA: Diagnosis not present

## 2022-08-09 DIAGNOSIS — J301 Allergic rhinitis due to pollen: Secondary | ICD-10-CM | POA: Diagnosis not present

## 2022-08-09 DIAGNOSIS — J3089 Other allergic rhinitis: Secondary | ICD-10-CM | POA: Diagnosis not present

## 2022-08-09 DIAGNOSIS — J3081 Allergic rhinitis due to animal (cat) (dog) hair and dander: Secondary | ICD-10-CM | POA: Diagnosis not present

## 2022-08-13 DIAGNOSIS — F419 Anxiety disorder, unspecified: Secondary | ICD-10-CM | POA: Diagnosis not present

## 2022-08-13 DIAGNOSIS — Z6827 Body mass index (BMI) 27.0-27.9, adult: Secondary | ICD-10-CM | POA: Diagnosis not present

## 2022-08-13 DIAGNOSIS — I10 Essential (primary) hypertension: Secondary | ICD-10-CM | POA: Diagnosis not present

## 2022-08-13 DIAGNOSIS — G4700 Insomnia, unspecified: Secondary | ICD-10-CM | POA: Diagnosis not present

## 2022-08-13 DIAGNOSIS — E663 Overweight: Secondary | ICD-10-CM | POA: Diagnosis not present

## 2022-08-13 DIAGNOSIS — E291 Testicular hypofunction: Secondary | ICD-10-CM | POA: Diagnosis not present

## 2022-08-24 DIAGNOSIS — M359 Systemic involvement of connective tissue, unspecified: Secondary | ICD-10-CM | POA: Diagnosis not present

## 2022-08-24 DIAGNOSIS — E663 Overweight: Secondary | ICD-10-CM | POA: Diagnosis not present

## 2022-08-24 DIAGNOSIS — I10 Essential (primary) hypertension: Secondary | ICD-10-CM | POA: Diagnosis not present

## 2022-08-24 DIAGNOSIS — K219 Gastro-esophageal reflux disease without esophagitis: Secondary | ICD-10-CM | POA: Diagnosis not present

## 2022-08-24 DIAGNOSIS — Z1331 Encounter for screening for depression: Secondary | ICD-10-CM | POA: Diagnosis not present

## 2022-08-24 DIAGNOSIS — H353122 Nonexudative age-related macular degeneration, left eye, intermediate dry stage: Secondary | ICD-10-CM | POA: Diagnosis not present

## 2022-08-24 DIAGNOSIS — E559 Vitamin D deficiency, unspecified: Secondary | ICD-10-CM | POA: Diagnosis not present

## 2022-08-24 DIAGNOSIS — H43813 Vitreous degeneration, bilateral: Secondary | ICD-10-CM | POA: Diagnosis not present

## 2022-08-24 DIAGNOSIS — E538 Deficiency of other specified B group vitamins: Secondary | ICD-10-CM | POA: Diagnosis not present

## 2022-08-24 DIAGNOSIS — H353211 Exudative age-related macular degeneration, right eye, with active choroidal neovascularization: Secondary | ICD-10-CM | POA: Diagnosis not present

## 2022-08-24 DIAGNOSIS — Z0001 Encounter for general adult medical examination with abnormal findings: Secondary | ICD-10-CM | POA: Diagnosis not present

## 2022-08-24 DIAGNOSIS — D649 Anemia, unspecified: Secondary | ICD-10-CM | POA: Diagnosis not present

## 2022-08-24 DIAGNOSIS — H35033 Hypertensive retinopathy, bilateral: Secondary | ICD-10-CM | POA: Diagnosis not present

## 2022-08-24 DIAGNOSIS — R5383 Other fatigue: Secondary | ICD-10-CM | POA: Diagnosis not present

## 2022-08-24 DIAGNOSIS — E782 Mixed hyperlipidemia: Secondary | ICD-10-CM | POA: Diagnosis not present

## 2022-08-24 DIAGNOSIS — Z6827 Body mass index (BMI) 27.0-27.9, adult: Secondary | ICD-10-CM | POA: Diagnosis not present

## 2022-08-24 DIAGNOSIS — E7849 Other hyperlipidemia: Secondary | ICD-10-CM | POA: Diagnosis not present

## 2022-08-28 ENCOUNTER — Ambulatory Visit: Payer: Medicare HMO | Attending: Nurse Practitioner | Admitting: Nurse Practitioner

## 2022-08-28 ENCOUNTER — Encounter: Payer: Self-pay | Admitting: Nurse Practitioner

## 2022-08-28 VITALS — BP 121/80 | HR 66 | Ht 72.0 in | Wt 196.4 lb

## 2022-08-28 DIAGNOSIS — Z87898 Personal history of other specified conditions: Secondary | ICD-10-CM

## 2022-08-28 DIAGNOSIS — I10 Essential (primary) hypertension: Secondary | ICD-10-CM

## 2022-08-28 DIAGNOSIS — I77819 Aortic ectasia, unspecified site: Secondary | ICD-10-CM

## 2022-08-28 DIAGNOSIS — I071 Rheumatic tricuspid insufficiency: Secondary | ICD-10-CM

## 2022-08-28 MED ORDER — AMLODIPINE BESYLATE 5 MG PO TABS: 5.0000 mg | ORAL_TABLET | Freq: Every day | ORAL | 3 refills | Status: DC

## 2022-08-28 NOTE — Progress Notes (Addendum)
Office Visit    Patient Name: Cole Brown Date of Encounter: 08/28/2022  PCP:  [facility], [place]  Cardiologist:  Carlyle Dolly, MD  Advanced Practice Provider:  Finis Bud, NP Electrophysiologist:  None   Chief Complaint    Cole Brown is a 77 y.o. male with a hx of palpitations, history of chest pain, hypertension, aortic dilatation, tricuspid regurgitation, and history of autoimmune arthritis and fibromyalgia, who presents today for 1 year follow-up.  Past Medical History    Past Medical History:  Diagnosis Date   Diverticula of colon    2016 colonoscopy   Fibromyalgia    Fibromyalgia    History of GI diverticular bleed    Hypercholesteremia    Hypertension    Past Surgical History:  Procedure Laterality Date   allergy shots  weekly   CHOLECYSTECTOMY     COLONOSCOPY     Dr.Rehman   COLONOSCOPY N/A 01/27/2015   Rehman: multiple diverticula at sigmoid colon,ext hemorrhoids   EYE SURGERY Bilateral    partial cornea transplants    GALLBLADDER SURGERY  12/1993   UPPER GASTROINTESTINAL ENDOSCOPY      Allergies  Allergies  Allergen Reactions   Plaquenil [Hydroxychloroquine Sulfate]     rash   Lodine [Etodolac] Swelling and Rash    History of Present Illness    Cole Brown is a very pleasant 77 y.o. male with a PMH as mentioned above.  Last seen by Dr. Carlyle Dolly on July 25, 2021.  HR 48 during OV, atenolol dose was lowered.  Was overall doing well at that time.  Today he presents for 1 year follow-up.  He is doing well.  Denies any acute cardiac complaints or issues. Denies any chest pain, shortness of breath, palpitations, syncope, presyncope, dizziness, orthopnea, PND, swelling or significant weight changes, acute bleeding, or claudication.  Stage: In his free time, he enjoys woodworking and playing the bassoon.   EKGs/Labs/Other Studies Reviewed:   The following studies were reviewed  today:   EKG:  EKG is ordered today.  The ekg ordered today demonstrates normal sinus rhythm, 66 bpm, no acute ischemic changes.  Echocardiogram on January 04, 2017: Study Conclusions   - Left ventricle: The cavity size was normal. Systolic function was    normal. The estimated ejection fraction was in the range of 60%    to 65%. There was an increased relative contribution of atrial    contraction to ventricular filling. Mild focal basal septal    hypertrophy.  - Aorta: Very mild ascending aortic dilatation. Maximal diameter    3.8 cm.  - Mitral valve: There was trivial regurgitation.  - Tricuspid valve: There was mild regurgitation.  - Pulmonary arteries: PA peak pressure: 36 mm Hg (S).  - Systemic veins: IVC is dilated with normal respiratory variation.    Estimated RAP 8 mmHg.   Recent Labs: 04/26/2022: ALT 16; BUN 20; Creat 0.88; Hemoglobin 11.9; Platelets 298; Potassium 4.4; Sodium 134  Recent Lipid Panel No results found for: "CHOL", "TRIG", "HDL", "CHOLHDL", "VLDL", "LDLCALC", "LDLDIRECT"   Home Medications   Current Meds  Medication Sig   albuterol (VENTOLIN HFA) 108 (90 Base) MCG/ACT inhaler Inhale 1 puff into the lungs as needed.   aspirin 81 MG EC tablet Take 1 tablet (81 mg total) by mouth daily.   atenolol (TENORMIN) 25 MG tablet Take 0.5 tablets (12.5 mg total) by mouth daily.   azelastine (ASTELIN) 0.1 % nasal  spray Place 1 spray into both nostrils 2 (two) times daily.   Cholecalciferol (VITAMIN D) 50 MCG (2000 UT) CAPS Take 2,000 Units by mouth daily.   dicyclomine (BENTYL) 10 MG capsule Take 1 capsule (10 mg total) by mouth daily before breakfast.   diphenhydrAMINE (BENADRYL) 25 MG tablet Take 25 mg by mouth as needed for allergies.    EPINEPHrine 0.3 mg/0.3 mL IJ SOAJ injection INJECT INTO THIGH ASONEEDED FOR ALLERGIC REACTION.   famotidine (PEPCID) 20 MG tablet Take 20 mg by mouth daily.   Ferrous Sulfate 142 (45 Fe) MG TBCR Take 1 tablet by mouth daily.    FLUoxetine (PROZAC) 10 MG tablet Take 10 mg by mouth daily.   fluticasone (FLONASE) 50 MCG/ACT nasal spray SMARTSIG:1-2 Spray(s) Both Nares Daily   folic acid (FOLVITE) 1 MG tablet TAKE 2 TABLETS BY MOUTH DAILY.   hydrocortisone (ANUSOL-HC) 2.5 % rectal cream Place 1 application rectally 2 (two) times daily.   hydrOXYzine (ATARAX/VISTARIL) 25 MG tablet Take 25 mg by mouth every 8 (eight) hours as needed.   Investigational - Study Medication Study name: atorvastatin '40mg'$  or placebo.   levocetirizine (XYZAL) 5 MG tablet Take 5 mg by mouth every evening.    methotrexate (RHEUMATREX) 2.5 MG tablet TAKE 8 TABLETS BY MOUTH ONCE WEEKLY.   montelukast (SINGULAIR) 10 MG tablet Take 10 mg by mouth at bedtime.   Multiple Vitamins-Minerals (ICAPS AREDS 2 PO) Take 1 capsule by mouth daily.   naproxen sodium (ALEVE) 220 MG tablet Take 220 mg by mouth daily as needed.   Omega-3 Fatty Acids (FISH OIL) 1000 MG CAPS Take 1 capsule by mouth daily.   Testosterone (ANDROGEL TD) Place onto the skin daily.   triazolam (HALCION) 0.25 MG tablet Take 0.25 mg by mouth at bedtime.    amLODipine (NORVASC) 5 MG tablet Take 5 mg by mouth daily.     Review of Systems    All other systems reviewed and are otherwise negative except as noted above.  Physical Exam    VS:  BP 121/80 (BP Location: Right Arm, Patient Position: Sitting, Cuff Size: Normal)   Pulse 66   Ht 6' (1.829 m)   Wt 196 lb 6.4 oz (89.1 kg)   SpO2 98%   BMI 26.64 kg/m  , BMI Body mass index is 26.64 kg/m.  Wt Readings from Last 3 Encounters:  08/28/22 196 lb 6.4 oz (89.1 kg)  05/15/22 196 lb 6.4 oz (89.1 kg)  04/26/22 194 lb (88 kg)     GEN: Well nourished, well developed, in no acute distress. HEENT: normal. Neck: Supple, no JVD, carotid bruits, or masses. Cardiac: S1/S2, RRR, no murmurs, rubs, or gallops. No clubbing, cyanosis, edema.  Radials/PT 2+ and equal bilaterally.  Respiratory:  Respirations regular and unlabored, clear to  auscultation bilaterally. MS: No deformity or atrophy. Skin: Warm and dry Neuro:  Strength and sensation are intact. Psych: Normal affect.  Assessment & Plan    History of palpitations/bradycardia Denies any tachycardia or palpitations.  Heart rate 66 bpm today.  Continue atenolol. Heart healthy diet and regular cardiovascular exercise encouraged.   Hypertension Blood pressure on arrival elevated, repeat BP 121/80.  Does admit to whitecoat hypertension. Discussed to monitor BP at home at least 2 hours after medications and sitting for 5-10 minutes.  Continue current medication regimen, will refill amlodipine per his request. Heart healthy diet and regular cardiovascular exercise encouraged.   3. Aortic dilatation, tricuspid regurgitation In 2018 echocardiogram revealed very mild ascending aortic  dilatation with maximal diameter at 3.8 cm with mild tricuspid regurg.  Denies any issues. Will route note to Dr. Carlyle Dolly to determine if Echo will need to be repeated at this time.   Addendum 09/03/2022: D/W Dr. Harl Bowie who stated will not need repeat imaging at this time.   Addendum 08/28/2022 - Dr. Carlyle Dolly recommended we will continue to monitor for now, no repeat imaging/Echo at this time.   Disposition: Follow up in 1 year(s) with Carlyle Dolly, MD or APP.  Signed, Finis Bud, NP 08/28/2022, 3:32 PM [facility]

## 2022-08-28 NOTE — Patient Instructions (Signed)

## 2022-08-30 ENCOUNTER — Other Ambulatory Visit: Payer: Self-pay | Admitting: Cardiology

## 2022-09-06 DIAGNOSIS — I10 Essential (primary) hypertension: Secondary | ICD-10-CM | POA: Diagnosis not present

## 2022-09-21 ENCOUNTER — Other Ambulatory Visit: Payer: Self-pay | Admitting: Physician Assistant

## 2022-09-21 DIAGNOSIS — H353211 Exudative age-related macular degeneration, right eye, with active choroidal neovascularization: Secondary | ICD-10-CM | POA: Diagnosis not present

## 2022-09-21 DIAGNOSIS — M359 Systemic involvement of connective tissue, unspecified: Secondary | ICD-10-CM

## 2022-10-11 NOTE — Progress Notes (Unsigned)
Office Visit Note  Patient: Cole Brown             Date of Birth: 10-Nov-1945           MRN: [id number]             PCP: Elfredia Nevins, MD Referring: Elfredia Nevins, MD Visit Date: 10/25/2022 Occupation: @GUAROCC @  Subjective:  Medication monitoring   History of Present Illness: Cole Brown is a 77 y.o. male with history of autoimmune disease, fibromyalgia, and osteoarthritis.  Patient remains on Methotrexate 8 tablets by mouth once weekly and folic acid 2 mg by mouth daily.  He is tolerating methotrexate without any side effects and has not missed any doses recently.  He denies any signs or symptoms of an autoimmune disease flare.  He remains on aspirin 81 mg daily.  He has intermittent symptoms of [name] phenomenon in his fingertips and toes with exposure to cold temperatures.  He denies any digital ulcers.  He has not had any recent rashes or photosensitivity.  He denies any sores in his mouth or nose.  He denies any sicca symptoms.  He denies any swollen lymph nodes.  He denies any recent or recurrent infections. He experiences some discomfort in both hips due to trochanteric bursitis when lying on his sides at night.  He denies any other joint pain or joint swelling at this time.   Activities of Daily Living:  Patient reports morning stiffness for 0 minutes.   Patient Reports nocturnal pain.  Difficulty dressing/grooming: Denies Difficulty climbing stairs: Denies Difficulty getting out of chair: Denies Difficulty using hands for taps, buttons, cutlery, and/or writing: Denies  Review of Systems  Constitutional:  Negative for fatigue.  HENT:  Negative for mouth sores and mouth dryness.   Eyes:  Negative for dryness.  Respiratory:  Negative for shortness of breath.   Cardiovascular:  Negative for chest pain and palpitations.  Gastrointestinal:  Negative for blood in stool, constipation and diarrhea.  Endocrine: Negative for increased urination.  Genitourinary:   Negative for involuntary urination.  Musculoskeletal:  Negative for joint pain, gait problem, joint pain, joint swelling, myalgias, muscle weakness, morning stiffness, muscle tenderness and myalgias.  Skin:  Negative for color change, rash, hair loss and sensitivity to sunlight.  Allergic/Immunologic: Negative for susceptible to infections.  Neurological:  Negative for dizziness and headaches.  Hematological:  Negative for swollen glands.  Psychiatric/Behavioral:  Negative for depressed mood and sleep disturbance. The patient is not nervous/anxious.     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   Fibromyalgia 08/21/2018   History of multiple allergies 08/21/2018   History of sleep apnea 08/21/2018   Rectal bleed 11/14/2016   Essential hypertension 11/14/2016    Past Medical History:  Diagnosis Date   Diverticula of colon    2016 colonoscopy   Fibromyalgia    Fibromyalgia    History of GI diverticular bleed    Hypercholesteremia    Hypertension     Family History  Problem Relation Age of Onset   Heart disease Mother    Pancreatic cancer Mother    Prostate cancer Father  Healthy Sister    Parkinson's disease Brother    Asthma Sister    Healthy Sister    Past Surgical History:  Procedure Laterality Date   allergy shots  weekly   CHOLECYSTECTOMY     COLONOSCOPY     Dr.Rehman   COLONOSCOPY N/A 01/27/2015   Rehman: multiple diverticula at sigmoid colon,ext hemorrhoids   EYE SURGERY Bilateral    partial cornea transplants    GALLBLADDER SURGERY  12/1993   UPPER  GASTROINTESTINAL ENDOSCOPY     Social History   Social History Narrative   Not on file   Immunization History  Administered Date(s) Administered   Influenza-Unspecified 04/16/2020   PFIZER(Purple Top)SARS-COV-2 Vaccination 08/05/2019, 08/26/2019   Zoster Recombinat (Shingrix) 01/03/2018, 06/12/2018     Objective: Vital Signs: BP 133/82 (BP Location: Left Arm, Patient Position: Sitting, Cuff Size: Large)   Pulse (!) 49   Resp 17   Ht 6' (1.829 m)   Wt 195 lb 6.4 oz (88.6 kg)   BMI 26.50 kg/m    Physical Exam Vitals and nursing note reviewed.  Constitutional:      Appearance: He is well-developed.  HENT:     Head: Normocephalic and atraumatic.  Eyes:     Conjunctiva/sclera: Conjunctivae normal.     Pupils: Pupils are equal, round, and reactive to light.  Cardiovascular:     Rate and Rhythm: Normal rate and regular rhythm.     Heart sounds: Normal heart sounds.  Pulmonary:     Effort: Pulmonary effort is normal.     Breath sounds: Normal breath sounds.  Abdominal:     General: Bowel sounds are normal.     Palpations: Abdomen is soft.  Musculoskeletal:     Cervical back: Normal range of motion and neck supple.  Skin:    General: Skin is warm and dry.     Capillary Refill: Capillary refill takes less than 2 seconds.  Neurological:     Mental Status: He is alert and oriented to person, place, and time.  Psychiatric:        Behavior: Behavior normal.      Musculoskeletal Exam: C-spine, thoracic spine, and lumbar spine good ROM.  Shoulder joints, elbow joints, wrist joints, MCPs, PIPs, and DIPs good ROM with no synovitis.  Complete fist formation bilaterally.  CMC thickening and subluxation bilaterally.  PIP and DIP thickening consistent with OA of both hands.  Hip joints have good ROM with no groin pain.  Tenderness over bilateral trochanteric bursa.  Knee joints have good ROM with no warmth or effusion.    CDAI Exam: CDAI Score: -- Patient Global: --; Provider  Global: -- Swollen: --; Tender: -- Joint Exam 10/25/2022   No joint exam has been documented for this visit   There is currently no information documented on the homunculus. Go to the Rheumatology activity and complete the homunculus joint exam.  Investigation: No additional findings.  Imaging: No results found.  Recent Labs: Lab Results  Component Value Date   WBC 9.3 04/26/2022   HGB 11.9 (L) 04/26/2022   PLT 298 04/26/2022   NA 134 (L) 04/26/2022   K 4.4 04/26/2022   CL 101 04/26/2022   CO2 24 04/26/2022   GLUCOSE 101 (H) 04/26/2022   BUN 20 04/26/2022   CREATININE 0.88 04/26/2022   BILITOT 0.4 04/26/2022   ALKPHOS 138 (H) 10/09/2021   AST 13 04/26/2022   ALT 16 04/26/2022   PROT 6.8 04/26/2022   ALBUMIN 4.1 10/09/2021   CALCIUM  9.1 04/26/2022   GFRAA 89 03/22/2020   QFTBGOLDPLUS NEGATIVE 08/21/2018    Speciality Comments: Plaquenil April 2020-discontinued due to rash Methotrexate is started Nov 06, 2018  Procedures:  No procedures performed Allergies: Plaquenil [hydroxychloroquine sulfate] and Lodine [etodolac]     Assessment / Plan:     Visit Diagnoses: Autoimmune disease (HCC) - ANA+ in 2018, ENA negative, [name] phenomenon, beta-2 GP 1 IgM positive, anticardiolipin IgM positive, elevated ESR, severe inflammatory arthritis: He has not had any signs or symptoms of an autoimmune disease flare.  He has clinically been doing well taking methotrexate 8 tablets by mouth once weekly along with folic acid 2 mg daily.  He has not had any recent rashes or photosensitivity.  He experiences intermittent symptoms of [name] phenomenon but remains on aspirin 81 mg daily and amlodipine 5 mg daily.  No digital ulcerations or signs of sclerodactyly were noted.  He has not had any oral or nasal ulcerations.  No sicca symptoms.  Energy level has been stable.  No synovitis was noted on examination today.  No cervical lymphadenopathy was palpable.  No parotid swelling or  tenderness noted. Lab work from 01/29/2022 was reviewed today in the office: ANA 1: 1280 NH, no proteinuria, double-stranded ENA negative, complements within normal limits, ESR 22, beta-2 glycoprotein IgM greater than 112, anticardiolipin IgM greater than 112.  The following lab work will be updated today.  He will remain on methotrexate and folic acid as prescribed.  A refill of methotrexate will be sent to the pharmacy today.  He was advised to notify us if he develops signs or symptoms of a flare.  He will follow-up in the office in 5 months or sooner if needed. - Plan: Protein / creatinine ratio, urine, CBC with Differential/Platelet, COMPLETE METABOLIC PANEL WITH GFR, Anti-DNA antibody, double-stranded, Sedimentation rate, C3 and C4, ANA, Beta-2 glycoprotein antibodies, Cardiolipin antibodies, IgG, IgM, IgA  Anti-cardiolipin antibody positive - Followed by Dr. Avie Arenas.  He is taking aspirin 81 mg daily.  Anticardiolipin IgM and beta-2 glycoprotein IgM were positive on 01/29/2022.  Plan on updating lab work today.- Plan: Beta-2 glycoprotein antibodies, Cardiolipin antibodies, IgG, IgM, IgA  High risk medication use - Methotrexate 8 tablets by mouth once weekly, folic acid 2 mg by mouth daily.  CBC and CMP were drawn on 04/26/2022.  Orders for CBC and CMP were released today. No recent or recurrent infections.  Discussed the importance of holding methotrexate if he develops signs or symptoms of an infection and to resume once the infection has completely cleared. - Plan: CBC with Differential/Platelet, COMPLETE METABOLIC PANEL WITH GFR  [name] disease without gangrene: He continues to have intermittent symptoms of [name] phenomenon.  No digital ulcerations or signs of gangrene were noted.  No signs of sclerodactyly noted.  Patient continues to take aspirin 81 mg daily and amlodipine 5 mg 1 tablet daily.  Primary osteoarthritis of both hands: He has PIP and DIP thickening consistent with  osteoarthritis of both hands.  No tenderness or synovitis noted today.  Fibromyalgia: He has not had any recent fibromyalgia flares.  Overall his symptoms have been tolerable.  Trochanteric bursitis of both hips: He experiences intermittent discomfort in both hips due to trochanteric bursitis especially when laying on his sides at night.  On examination today he has good range of motion of both hips with no groin pain.  Discussed the importance of performing stretching exercises daily.  Other medical conditions are listed as follows:   History of iron  deficiency anemia  History of diverticulitis  Fuchs' corneal dystrophy, unspecified laterality  Essential hypertension: BP was 133/82 today in the office.   Hypercholesteremia  History of sleep apnea  History of multiple allergies  Orders: Orders Placed This Encounter  Procedures   Protein / creatinine ratio, urine   CBC with Differential/Platelet   COMPLETE METABOLIC PANEL WITH GFR   Anti-DNA antibody, double-stranded   Sedimentation rate   C3 and C4   ANA   Beta-2 glycoprotein antibodies   Cardiolipin antibodies, IgG, IgM, IgA   No orders of the defined types were placed in this encounter.   Follow-Up Instructions: Return in about 5 months (around 03/27/2023) for Autoimmune Disease, Fibromyalgia, Osteoarthritis.   Gearldine Bienenstock, PA-C  Note - This record has been created using Dragon software.  Chart creation errors have been sought, but may not always  have been located. Such creation errors do not reflect on  the standard of medical care.

## 2022-10-12 ENCOUNTER — Other Ambulatory Visit: Payer: Self-pay | Admitting: Physician Assistant

## 2022-10-12 DIAGNOSIS — M359 Systemic involvement of connective tissue, unspecified: Secondary | ICD-10-CM

## 2022-10-12 NOTE — Telephone Encounter (Signed)
Last Fill: 10/23/2021  Next Visit: 10/25/2022  Last Visit: 04/26/2022  Dx: Autoimmune disease   Current Dose per office note on 04/26/2022: folic acid 2 mg by mouth daily   Okay to refill folic acid?

## 2022-10-18 DIAGNOSIS — L570 Actinic keratosis: Secondary | ICD-10-CM | POA: Diagnosis not present

## 2022-10-18 DIAGNOSIS — D220 Melanocytic nevi of lip: Secondary | ICD-10-CM | POA: Diagnosis not present

## 2022-10-18 DIAGNOSIS — L578 Other skin changes due to chronic exposure to nonionizing radiation: Secondary | ICD-10-CM | POA: Diagnosis not present

## 2022-10-19 DIAGNOSIS — H353211 Exudative age-related macular degeneration, right eye, with active choroidal neovascularization: Secondary | ICD-10-CM | POA: Diagnosis not present

## 2022-10-25 ENCOUNTER — Other Ambulatory Visit: Payer: Self-pay

## 2022-10-25 ENCOUNTER — Encounter: Payer: Self-pay | Admitting: Physician Assistant

## 2022-10-25 ENCOUNTER — Ambulatory Visit: Payer: Medicare HMO | Attending: Rheumatology | Admitting: Physician Assistant

## 2022-10-25 VITALS — BP 133/82 | HR 49 | Resp 17 | Ht 72.0 in | Wt 195.4 lb

## 2022-10-25 DIAGNOSIS — M359 Systemic involvement of connective tissue, unspecified: Secondary | ICD-10-CM

## 2022-10-25 DIAGNOSIS — M7062 Trochanteric bursitis, left hip: Secondary | ICD-10-CM

## 2022-10-25 DIAGNOSIS — Z8719 Personal history of other diseases of the digestive system: Secondary | ICD-10-CM

## 2022-10-25 DIAGNOSIS — M7061 Trochanteric bursitis, right hip: Secondary | ICD-10-CM

## 2022-10-25 DIAGNOSIS — E7800 Pure hypercholesterolemia, unspecified: Secondary | ICD-10-CM

## 2022-10-25 DIAGNOSIS — H18519 Endothelial corneal dystrophy, unspecified eye: Secondary | ICD-10-CM

## 2022-10-25 DIAGNOSIS — Z79899 Other long term (current) drug therapy: Secondary | ICD-10-CM

## 2022-10-25 DIAGNOSIS — Z8669 Personal history of other diseases of the nervous system and sense organs: Secondary | ICD-10-CM

## 2022-10-25 DIAGNOSIS — R760 Raised antibody titer: Secondary | ICD-10-CM

## 2022-10-25 DIAGNOSIS — I10 Essential (primary) hypertension: Secondary | ICD-10-CM

## 2022-10-25 DIAGNOSIS — M19042 Primary osteoarthritis, left hand: Secondary | ICD-10-CM

## 2022-10-25 DIAGNOSIS — M19041 Primary osteoarthritis, right hand: Secondary | ICD-10-CM | POA: Diagnosis not present

## 2022-10-25 DIAGNOSIS — I7300 Raynaud's syndrome without gangrene: Secondary | ICD-10-CM | POA: Diagnosis not present

## 2022-10-25 DIAGNOSIS — Z862 Personal history of diseases of the blood and blood-forming organs and certain disorders involving the immune mechanism: Secondary | ICD-10-CM

## 2022-10-25 DIAGNOSIS — Z889 Allergy status to unspecified drugs, medicaments and biological substances status: Secondary | ICD-10-CM

## 2022-10-25 DIAGNOSIS — M797 Fibromyalgia: Secondary | ICD-10-CM | POA: Diagnosis not present

## 2022-10-25 MED ORDER — METHOTREXATE SODIUM 2.5 MG PO TABS: 20.0000 mg | ORAL_TABLET | ORAL | 0 refills | Status: DC

## 2022-10-25 NOTE — Telephone Encounter (Signed)
Please review and sign pended MTX refill. Thanks!

## 2022-10-30 LAB — COMPLETE METABOLIC PANEL WITH GFR
AG Ratio: 1.5 (calc) (ref 1.0–2.5)
ALT: 11 U/L (ref 9–46)
AST: 12 U/L (ref 10–35)
Albumin: 3.7 g/dL (ref 3.6–5.1)
Alkaline phosphatase (APISO): 97 U/L (ref 35–144)
BUN: 20 mg/dL (ref 7–25)
CO2: 25 mmol/L (ref 20–32)
Calcium: 8.8 mg/dL (ref 8.6–10.3)
Chloride: 99 mmol/L (ref 98–110)
Creat: 0.89 mg/dL (ref 0.70–1.28)
Globulin: 2.5 g/dL (calc) (ref 1.9–3.7)
Glucose, Bld: 107 mg/dL — ABNORMAL HIGH (ref 65–99)
Potassium: 4.2 mmol/L (ref 3.5–5.3)
Sodium: 132 mmol/L — ABNORMAL LOW (ref 135–146)
Total Bilirubin: 0.5 mg/dL (ref 0.2–1.2)
Total Protein: 6.2 g/dL (ref 6.1–8.1)
eGFR: 87 mL/min/{1.73_m2} (ref >=60)

## 2022-10-30 LAB — CBC WITH DIFFERENTIAL/PLATELET
Absolute Monocytes: 650 cells/uL (ref 200–950)
Basophils Absolute: 68 cells/uL (ref 0–200)
Basophils Relative: 0.7 %
Eosinophils Absolute: 330 cells/uL (ref 15–500)
Eosinophils Relative: 3.4 %
HCT: 33.2 % — ABNORMAL LOW (ref 38.5–50.0)
Hemoglobin: 10.8 g/dL — ABNORMAL LOW (ref 13.2–17.1)
Lymphs Abs: 1164 cells/uL (ref 850–3900)
MCH: 29.6 pg (ref 27.0–33.0)
MCHC: 32.5 g/dL (ref 32.0–36.0)
MCV: 91 fL (ref 80.0–100.0)
MPV: 8.8 fL (ref 7.5–12.5)
Monocytes Relative: 6.7 %
Neutro Abs: 7488 cells/uL (ref 1500–7800)
Neutrophils Relative %: 77.2 %
Platelets: 280 10*3/uL (ref 140–400)
RBC: 3.65 10*6/uL — ABNORMAL LOW (ref 4.20–5.80)
RDW: 15.2 % — ABNORMAL HIGH (ref 11.0–15.0)
Total Lymphocyte: 12 %
WBC: 9.7 10*3/uL (ref 3.8–10.8)

## 2022-10-30 LAB — ANTI-DNA ANTIBODY, DOUBLE-STRANDED: ds DNA Ab: <1 IU/mL

## 2022-10-30 LAB — PROTEIN / CREATININE RATIO, URINE
Creatinine, Urine: 53 mg/dL (ref 20–320)
Protein/Creat Ratio: 132 mg/g creat (ref 25–148)
Protein/Creatinine Ratio: 0.132 mg/mg creat (ref 0.025–0.148)
Total Protein, Urine: 7 mg/dL (ref 5–25)

## 2022-10-30 LAB — SEDIMENTATION RATE: Sed Rate: 29 mm/h — ABNORMAL HIGH (ref 0–20)

## 2022-10-30 LAB — CARDIOLIPIN ANTIBODIES, IGG, IGM, IGA
Anticardiolipin IgA: 10 APL-U/mL (ref <20.0)
Anticardiolipin IgG: 13.2 GPL-U/mL (ref <20.0)
Anticardiolipin IgM: >112 MPL-U/mL — ABNORMAL HIGH (ref <20.0)

## 2022-10-30 LAB — ANA: Anti Nuclear Antibody (ANA): POSITIVE — AB

## 2022-10-30 LAB — C3 AND C4
C3 Complement: 124 mg/dL (ref 82–185)
C4 Complement: 26 mg/dL (ref 15–53)

## 2022-10-30 LAB — BETA-2 GLYCOPROTEIN ANTIBODIES
Beta-2 Glyco 1 IgA: 9.1 U/mL (ref <20.0)
Beta-2 Glyco 1 IgM: >112 U/mL — ABNORMAL HIGH (ref <20.0)
Beta-2 Glyco I IgG: 10.9 U/mL (ref <20.0)

## 2022-10-30 LAB — ANTI-NUCLEAR AB-TITER (ANA TITER): ANA Titer 1: 1:320 {titer} — ABNORMAL HIGH

## 2022-10-30 NOTE — Progress Notes (Signed)
Patient's anemia has worsened slightly.  Rest of CBC within normal limits. Please clarify if he has undergone workup for anemia? Any source of bleeding?   Sodium remains low but stable.  Glucose 107.  Rest of CMP within normal limits.  ESR remains borderline elevated but stable.  ANA remains positive-lower titer.  Beta-2 glycoprotein IgM and anticardiolipin IgM remain positive. Protein creatinine ratio within normal limits.  Complements within normal limits.  dsDNA is negative.

## 2022-10-31 NOTE — Progress Notes (Signed)
Please advise patient to follow up with PCP for anemia workup.  MTX dose has not changed.

## 2022-11-16 DIAGNOSIS — H353211 Exudative age-related macular degeneration, right eye, with active choroidal neovascularization: Secondary | ICD-10-CM | POA: Diagnosis not present

## 2022-12-01 ENCOUNTER — Other Ambulatory Visit: Payer: Self-pay | Admitting: Cardiology

## 2022-12-03 DIAGNOSIS — Z947 Corneal transplant status: Secondary | ICD-10-CM | POA: Diagnosis not present

## 2022-12-03 DIAGNOSIS — Z9841 Cataract extraction status, right eye: Secondary | ICD-10-CM | POA: Diagnosis not present

## 2022-12-03 DIAGNOSIS — Z961 Presence of intraocular lens: Secondary | ICD-10-CM | POA: Diagnosis not present

## 2022-12-03 DIAGNOSIS — Z9842 Cataract extraction status, left eye: Secondary | ICD-10-CM | POA: Diagnosis not present

## 2022-12-14 DIAGNOSIS — H353122 Nonexudative age-related macular degeneration, left eye, intermediate dry stage: Secondary | ICD-10-CM | POA: Diagnosis not present

## 2022-12-14 DIAGNOSIS — Z961 Presence of intraocular lens: Secondary | ICD-10-CM | POA: Diagnosis not present

## 2022-12-14 DIAGNOSIS — H353211 Exudative age-related macular degeneration, right eye, with active choroidal neovascularization: Secondary | ICD-10-CM | POA: Diagnosis not present

## 2022-12-14 DIAGNOSIS — H43813 Vitreous degeneration, bilateral: Secondary | ICD-10-CM | POA: Diagnosis not present

## 2022-12-14 DIAGNOSIS — H35033 Hypertensive retinopathy, bilateral: Secondary | ICD-10-CM | POA: Diagnosis not present

## 2022-12-18 ENCOUNTER — Encounter (INDEPENDENT_AMBULATORY_CARE_PROVIDER_SITE_OTHER): Payer: Self-pay | Admitting: *Deleted

## 2022-12-18 DIAGNOSIS — M353 Polymyalgia rheumatica: Secondary | ICD-10-CM | POA: Diagnosis not present

## 2022-12-18 DIAGNOSIS — E782 Mixed hyperlipidemia: Secondary | ICD-10-CM | POA: Diagnosis not present

## 2022-12-18 DIAGNOSIS — G4700 Insomnia, unspecified: Secondary | ICD-10-CM | POA: Diagnosis not present

## 2022-12-18 DIAGNOSIS — M359 Systemic involvement of connective tissue, unspecified: Secondary | ICD-10-CM | POA: Diagnosis not present

## 2022-12-18 DIAGNOSIS — K625 Hemorrhage of anus and rectum: Secondary | ICD-10-CM | POA: Diagnosis not present

## 2022-12-18 DIAGNOSIS — E291 Testicular hypofunction: Secondary | ICD-10-CM | POA: Diagnosis not present

## 2022-12-18 DIAGNOSIS — I10 Essential (primary) hypertension: Secondary | ICD-10-CM | POA: Diagnosis not present

## 2022-12-18 DIAGNOSIS — R5383 Other fatigue: Secondary | ICD-10-CM | POA: Diagnosis not present

## 2022-12-18 DIAGNOSIS — K219 Gastro-esophageal reflux disease without esophagitis: Secondary | ICD-10-CM | POA: Diagnosis not present

## 2022-12-19 DIAGNOSIS — M359 Systemic involvement of connective tissue, unspecified: Secondary | ICD-10-CM | POA: Diagnosis not present

## 2022-12-19 DIAGNOSIS — E559 Vitamin D deficiency, unspecified: Secondary | ICD-10-CM | POA: Diagnosis not present

## 2022-12-19 DIAGNOSIS — R5383 Other fatigue: Secondary | ICD-10-CM | POA: Diagnosis not present

## 2022-12-19 DIAGNOSIS — K625 Hemorrhage of anus and rectum: Secondary | ICD-10-CM | POA: Diagnosis not present

## 2022-12-19 DIAGNOSIS — M353 Polymyalgia rheumatica: Secondary | ICD-10-CM | POA: Diagnosis not present

## 2022-12-19 DIAGNOSIS — Z125 Encounter for screening for malignant neoplasm of prostate: Secondary | ICD-10-CM | POA: Diagnosis not present

## 2022-12-19 DIAGNOSIS — I10 Essential (primary) hypertension: Secondary | ICD-10-CM | POA: Diagnosis not present

## 2022-12-25 ENCOUNTER — Ambulatory Visit (INDEPENDENT_AMBULATORY_CARE_PROVIDER_SITE_OTHER): Payer: Medicare HMO | Admitting: Gastroenterology

## 2022-12-25 ENCOUNTER — Encounter (INDEPENDENT_AMBULATORY_CARE_PROVIDER_SITE_OTHER): Payer: Self-pay | Admitting: Gastroenterology

## 2022-12-25 VITALS — BP 126/66 | HR 76 | Temp 97.5°F | Ht 72.0 in | Wt 191.1 lb

## 2022-12-25 DIAGNOSIS — Z862 Personal history of diseases of the blood and blood-forming organs and certain disorders involving the immune mechanism: Secondary | ICD-10-CM | POA: Diagnosis not present

## 2022-12-25 DIAGNOSIS — K921 Melena: Secondary | ICD-10-CM | POA: Diagnosis not present

## 2022-12-25 DIAGNOSIS — K5730 Diverticulosis of large intestine without perforation or abscess without bleeding: Secondary | ICD-10-CM | POA: Diagnosis not present

## 2022-12-25 MED ORDER — PEG 3350-KCL-NA BICARB-NACL 420 G PO SOLR: 4000.0000 mL | Freq: Once | ORAL | 0 refills | Status: AC

## 2022-12-25 NOTE — Patient Instructions (Signed)
It was very nice to meet you today, as dicussed with will plan for the following :  1) Will schedule for upper endoscopy and Colonoscopy given worsening Iron

## 2022-12-25 NOTE — Progress Notes (Signed)
Cole Brown , M.D. Gastroenterology & Hepatology [facility] Gastroenterology [address] [place], [place] [address] Primary Care Physician: Elfredia Nevins, MD [address] [place] [address]  Chief Complaint:  rectal bleeding , worsening anemia   History of Present Illness:  [name] is a 77 y.o. male with past medical history of inflammatory arthritis,  fibromyalgia with intermittent [name] phenomena on oral methotrexate , diverticular bleed (2018 managed conservatively), high cholesterol and HTN is referred for worsening anemia and rectal bleeding .   Patient reports that he notices intermittent fresh blood upon wiping, denies blood dripping in the toilet bowl or mixed with the stool.  He has a bowel movement every day which is soft without straining. he patient denies having any nausea, vomiting, fever, chills, melena, hematemesis, abdominal distention, abdominal pain, diarrhea, jaundice, pruritus or weight loss.  Patient reports having iron deficiency anemia and has been taking oral iron supplementation for a while .  He had blood work done last week which showed worsening anemia(unde [facility]). Patient weight is stable, denies any upper GI symptoms  HBG: 10.8 MCV: 91  Normal Liver enzymes  Last WGN:FAOZ Last Colonoscopy:august 2016 with multiple diverticula at sigmoid colon and external hemorrhoids   FHx: neg for any gastrointestinal/liver disease, no malignancies Social: neg smoking, alcohol or illicit drug use  Past Medical History: Past Medical History:  Diagnosis Date   Diverticula of colon    2016 colonoscopy   Fibromyalgia    Fibromyalgia    History of GI diverticular bleed    Hypercholesteremia    Hypertension     Past Surgical History: Past Surgical History:  Procedure Laterality Date   allergy shots  weekly   CHOLECYSTECTOMY     COLONOSCOPY     Dr.Rehman   COLONOSCOPY N/A 01/27/2015   Rehman:  multiple diverticula at sigmoid colon,ext hemorrhoids   EYE SURGERY Bilateral    partial cornea transplants    GALLBLADDER SURGERY  12/1993   UPPER GASTROINTESTINAL ENDOSCOPY      Family History: Family History  Problem Relation Age of Onset   Heart disease Mother    Pancreatic cancer Mother    Prostate cancer Father    Healthy Sister    Parkinson's disease Brother    Asthma Sister    Healthy Sister     Social History: Social History   Tobacco Use  Smoking Status Never   Passive exposure: Never  Smokeless Tobacco Never   Social History   Substance and Sexual Activity  Alcohol Use Yes   Alcohol/week: 0.0 standard drinks of alcohol   Comment: occas   Social History   Substance and Sexual Activity  Drug Use No    Allergies: Allergies  Allergen Reactions   Plaquenil [Hydroxychloroquine Sulfate]     rash   Lodine [Etodolac] Swelling and Rash    Medications: Current Outpatient Medications  Medication Sig Dispense Refill   albuterol (VENTOLIN HFA) 108 (90 Base) MCG/ACT inhaler Inhale 1 puff into the lungs as needed.     amLODipine (NORVASC) 5 MG tablet Take 1 tablet (5 mg total) by mouth daily. 90 tablet 3   aspirin 81 MG EC tablet Take 1 tablet (81 mg total) by mouth daily.     atenolol (TENORMIN) 25 MG tablet TAKE (1/2) TABLET BY MOUTH ONCE DAILY. 45 tablet 2   azelastine (ASTELIN) 0.1 % nasal spray Place 1 spray into both nostrils 2 (two) times daily.     Cholecalciferol (VITAMIN D)  50 MCG (2000 UT) CAPS Take 2,000 Units by mouth daily.     dicyclomine (BENTYL) 10 MG capsule Take 1 capsule (10 mg total) by mouth daily before breakfast. 30 capsule 1   diphenhydrAMINE (BENADRYL) 25 MG tablet Take 25 mg by mouth as needed for allergies.      EPINEPHrine 0.3 mg/0.3 mL IJ SOAJ injection INJECT INTO THIGH ASONEEDED FOR ALLERGIC REACTION.     famotidine (PEPCID) 20 MG tablet Take 20 mg by mouth daily.     Ferrous Sulfate 142 (45 Fe) MG TBCR Take 1 tablet by mouth  daily.     FLUoxetine (PROZAC) 10 MG tablet Take 10 mg by mouth daily.     fluticasone (FLONASE) 50 MCG/ACT nasal spray SMARTSIG:1-2 Spray(s) Both Nares Daily     folic acid (FOLVITE) 1 MG tablet TAKE 2 TABLETS BY MOUTH DAILY. 180 tablet 3   hydrocortisone (ANUSOL-HC) 2.5 % rectal cream Place 1 application rectally 2 (two) times daily. 30 g 1   hydrOXYzine (ATARAX/VISTARIL) 25 MG tablet Take 25 mg by mouth every 8 (eight) hours as needed.     Investigational - Study Medication Study name: atorvastatin 40mg  or placebo.     levocetirizine (XYZAL) 5 MG tablet Take 5 mg by mouth every evening.      methotrexate (RHEUMATREX) 2.5 MG tablet Take 8 tablets (20 mg total) by mouth once a week. 96 tablet 0   montelukast (SINGULAIR) 10 MG tablet Take 10 mg by mouth at bedtime.     Multiple Vitamins-Minerals (ICAPS AREDS 2 PO) Take 1 capsule by mouth daily.     naproxen sodium (ALEVE) 220 MG tablet Take 220 mg by mouth daily as needed.     Omega-3 Fatty Acids (FISH OIL) 1000 MG CAPS Take 1 capsule by mouth daily.     Testosterone (ANDROGEL TD) Place onto the skin daily.     triazolam (HALCION) 0.25 MG tablet Take 0.25 mg by mouth at bedtime.      No current facility-administered medications for this visit.    Review of Systems: GENERAL: negative for malaise, night sweats HEENT: No changes in hearing or vision, no nose bleeds or other nasal problems. NECK: Negative for lumps, goiter, pain and significant neck swelling RESPIRATORY: Negative for cough, wheezing CARDIOVASCULAR: Negative for chest pain, leg swelling, palpitations, orthopnea GI: SEE HPI MUSCULOSKELETAL: Negative for joint pain or swelling, back pain, and muscle pain. SKIN: Negative for lesions, rash HEMATOLOGY Negative for prolonged bleeding, bruising easily, and swollen nodes. ENDOCRINE: Negative for cold or heat intolerance, polyuria, polydipsia and goiter. NEURO: negative for tremor, gait imbalance, syncope and seizures. The remainder  of the review of systems is noncontributory.   Physical Exam: BP 126/66 (BP Location: Left Arm, Patient Position: Sitting, Cuff Size: Normal)   Pulse 76   Temp (!) 97.5 F (36.4 C) (Temporal)   Ht 6' (1.829 m)   Wt 191 lb 1.6 oz (86.7 kg)   BMI 25.92 kg/m  GENERAL: The patient is AO x3, in no acute distress. HEENT: Head is normocephalic and atraumatic. EOMI are intact. Mouth is well hydrated and without lesions. NECK: Supple. No masses LUNGS: Clear to auscultation. No presence of rhonchi/wheezing/rales. Adequate chest expansion HEART: RRR, normal s1 and s2. ABDOMEN: Soft, nontender, no guarding, no peritoneal signs, and nondistended. BS +. No masses. EXTREMITIES: Without any cyanosis, clubbing, rash, lesions or edema. NEUROLOGIC: AOx3, no focal motor deficit. SKIN: no jaundice, no rashes   Imaging/Labs: as above  I personally reviewed and interpreted  the available labs, imaging and endoscopic files.  2014  IMPRESSION  1. DIVERTICULOSIS OF THE SIGMOID COLON BUT NO EVIDENCE FOR ACUTE DIVERTICULITIS.  2.  CONSTIPATION.  3.  NO OTHER SIGNIFICANT FINDINGS IN THE PELVIS.       Impression and Plan:  LAQUARIUS STEMARIE is a 77 y.o. male with past medical history of inflammatory arthritis,  fibromyalgia with intermittent [name] phenomena on oral methotrexate , diverticular bleed (2018 managed conservatively), high cholesterol and HTN is referred for worsening anemia and rectal bleeding .   #Normocytic anemia  Patient with baseline Hbg 12 and has been trending down with most recent hemoglobin of 9.1 last week  %sat : 7 and Iron level 9 . No ferritin on file but patient has been told that he has IDA and taking PO iron .  There could be component of Anemia of chronic disease with IDA given normal TIBC.  Would recommend checking Ferritin level with next blood work to assess severity of IDA  As per ACG guideline for IDA will proceed with diagnostic upper endoscopy and  colonoscopy  # Painless hematochezia   Given patient has underlying autoimmune disease ( inflammatory arthritis on methotrexate) need to rule out IBD given painless hematochezia and worsening anemia   Patient had a colonoscopy in 2016 with hemorrhoids and diverticulosis.  Was also admitted in 2018 for diverticular bleed treated conservatively.   Will proceed with colonoscopy as discussed, hold p.o. iron for 7 days prior to the procedure  All questions were answered.      Cole Lawman, MD Gastroenterology and Hepatology [facility] Gastroenterology

## 2022-12-26 ENCOUNTER — Telehealth (INDEPENDENT_AMBULATORY_CARE_PROVIDER_SITE_OTHER): Payer: Self-pay | Admitting: Gastroenterology

## 2022-12-26 NOTE — Telephone Encounter (Signed)
Pt wife called in and states they will be out of town on 01/03/23 therefore needing to reschedule TCS/EGD. Pt has been rescheduled to 01/16/23 at 9:00am with Dr.Ahmed. Will send new instructions via mail. Will call with pre op appt. OK to leave message on phone. Will send message to Endo

## 2022-12-27 ENCOUNTER — Encounter (INDEPENDENT_AMBULATORY_CARE_PROVIDER_SITE_OTHER): Payer: Self-pay

## 2022-12-27 NOTE — Telephone Encounter (Signed)
Letter mailed with pre op information. Pre op 01/14/23 at 10:30am

## 2023-01-01 ENCOUNTER — Other Ambulatory Visit (HOSPITAL_COMMUNITY): Payer: Medicare HMO

## 2023-01-03 ENCOUNTER — Emergency Department (HOSPITAL_COMMUNITY): Payer: Medicare HMO

## 2023-01-03 ENCOUNTER — Encounter (HOSPITAL_COMMUNITY): Payer: Self-pay | Admitting: Emergency Medicine

## 2023-01-03 ENCOUNTER — Other Ambulatory Visit: Payer: Self-pay

## 2023-01-03 ENCOUNTER — Telehealth (INDEPENDENT_AMBULATORY_CARE_PROVIDER_SITE_OTHER): Payer: Self-pay | Admitting: Gastroenterology

## 2023-01-03 ENCOUNTER — Observation Stay (HOSPITAL_COMMUNITY)
Admission: EM | Admit: 2023-01-03 | Discharge: 2023-01-04 | Disposition: A | Discharge status: Home / Self Care | Payer: Medicare HMO | Attending: Family Medicine | Admitting: Family Medicine

## 2023-01-03 DIAGNOSIS — Z7982 Long term (current) use of aspirin: Secondary | ICD-10-CM | POA: Insufficient documentation

## 2023-01-03 DIAGNOSIS — Z7952 Long term (current) use of systemic steroids: Secondary | ICD-10-CM | POA: Insufficient documentation

## 2023-01-03 DIAGNOSIS — E871 Hypo-osmolality and hyponatremia: Secondary | ICD-10-CM

## 2023-01-03 DIAGNOSIS — K5792 Diverticulitis of intestine, part unspecified, without perforation or abscess without bleeding: Secondary | ICD-10-CM | POA: Diagnosis not present

## 2023-01-03 DIAGNOSIS — Z862 Personal history of diseases of the blood and blood-forming organs and certain disorders involving the immune mechanism: Secondary | ICD-10-CM | POA: Diagnosis not present

## 2023-01-03 DIAGNOSIS — Z79899 Other long term (current) drug therapy: Secondary | ICD-10-CM | POA: Diagnosis not present

## 2023-01-03 DIAGNOSIS — I10 Essential (primary) hypertension: Secondary | ICD-10-CM

## 2023-01-03 DIAGNOSIS — N281 Cyst of kidney, acquired: Secondary | ICD-10-CM | POA: Diagnosis not present

## 2023-01-03 DIAGNOSIS — A09 Infectious gastroenteritis and colitis, unspecified: Secondary | ICD-10-CM | POA: Insufficient documentation

## 2023-01-03 DIAGNOSIS — R197 Diarrhea, unspecified: Secondary | ICD-10-CM | POA: Diagnosis present

## 2023-01-03 DIAGNOSIS — K5732 Diverticulitis of large intestine without perforation or abscess without bleeding: Secondary | ICD-10-CM | POA: Diagnosis not present

## 2023-01-03 DIAGNOSIS — M797 Fibromyalgia: Secondary | ICD-10-CM | POA: Insufficient documentation

## 2023-01-03 DIAGNOSIS — E7800 Pure hypercholesterolemia, unspecified: Secondary | ICD-10-CM | POA: Insufficient documentation

## 2023-01-03 DIAGNOSIS — K449 Diaphragmatic hernia without obstruction or gangrene: Secondary | ICD-10-CM | POA: Diagnosis not present

## 2023-01-03 DIAGNOSIS — Z8719 Personal history of other diseases of the digestive system: Secondary | ICD-10-CM

## 2023-01-03 DIAGNOSIS — D62 Acute posthemorrhagic anemia: Secondary | ICD-10-CM | POA: Diagnosis not present

## 2023-01-03 DIAGNOSIS — Z8669 Personal history of other diseases of the nervous system and sense organs: Secondary | ICD-10-CM

## 2023-01-03 DIAGNOSIS — K5730 Diverticulosis of large intestine without perforation or abscess without bleeding: Secondary | ICD-10-CM | POA: Diagnosis not present

## 2023-01-03 LAB — TSH: TSH: 0.605 u[IU]/mL (ref 0.350–4.500)

## 2023-01-03 LAB — CBC WITH DIFFERENTIAL/PLATELET
Abs Immature Granulocytes: 0.02 10*3/uL (ref 0.00–0.07)
Basophils Absolute: 0.1 10*3/uL (ref 0.0–0.1)
Basophils Relative: 1 %
Eosinophils Absolute: 0.1 10*3/uL (ref 0.0–0.5)
Eosinophils Relative: 1 %
HCT: 23.4 % — ABNORMAL LOW (ref 39.0–52.0)
Hemoglobin: 7.3 g/dL — ABNORMAL LOW (ref 13.0–17.0)
Immature Granulocytes: 0 %
Lymphocytes Relative: 15 %
Lymphs Abs: 0.8 10*3/uL (ref 0.7–4.0)
MCH: 27.8 pg (ref 26.0–34.0)
MCHC: 31.2 g/dL (ref 30.0–36.0)
MCV: 89 fL (ref 80.0–100.0)
Monocytes Absolute: 0.5 10*3/uL (ref 0.1–1.0)
Monocytes Relative: 8 %
Neutro Abs: 4.1 10*3/uL (ref 1.7–7.7)
Neutrophils Relative %: 75 %
Platelets: 313 10*3/uL (ref 150–400)
RBC: 2.63 MIL/uL — ABNORMAL LOW (ref 4.22–5.81)
RDW: 16.9 % — ABNORMAL HIGH (ref 11.5–15.5)
WBC: 5.5 10*3/uL (ref 4.0–10.5)
nRBC: 0 % (ref 0.0–0.2)

## 2023-01-03 LAB — RETICULOCYTES
Immature Retic Fract: 21 % — ABNORMAL HIGH (ref 2.3–15.9)
RBC.: 2.63 MIL/uL — ABNORMAL LOW (ref 4.22–5.81)
Retic Count, Absolute: 30.7 10*3/uL (ref 19.0–186.0)
Retic Ct Pct: 1.2 % (ref 0.4–3.1)

## 2023-01-03 LAB — COMPREHENSIVE METABOLIC PANEL
ALT: 18 U/L (ref 0–44)
AST: 15 U/L (ref 15–41)
Albumin: 2.5 g/dL — ABNORMAL LOW (ref 3.5–5.0)
Alkaline Phosphatase: 78 U/L (ref 38–126)
Anion gap: 6 (ref 5–15)
BUN: 17 mg/dL (ref 8–23)
CO2: 23 mmol/L (ref 22–32)
Calcium: 8.3 mg/dL — ABNORMAL LOW (ref 8.9–10.3)
Chloride: 99 mmol/L (ref 98–111)
Creatinine, Ser: 0.86 mg/dL (ref 0.61–1.24)
GFR, Estimated: >60 mL/min (ref >60)
Glucose, Bld: 111 mg/dL — ABNORMAL HIGH (ref 70–99)
Potassium: 3.7 mmol/L (ref 3.5–5.1)
Sodium: 128 mmol/L — ABNORMAL LOW (ref 135–145)
Total Bilirubin: 0.5 mg/dL (ref 0.3–1.2)
Total Protein: 6.1 g/dL — ABNORMAL LOW (ref 6.5–8.1)

## 2023-01-03 LAB — IRON AND TIBC
Iron: 16 ug/dL — ABNORMAL LOW (ref 45–182)
Saturation Ratios: 6 % — ABNORMAL LOW (ref 17.9–39.5)
TIBC: 265 ug/dL (ref 250–450)
UIBC: 249 ug/dL

## 2023-01-03 LAB — FOLATE: Folate: 14.8 ng/mL (ref >5.9)

## 2023-01-03 LAB — VITAMIN B12: Vitamin B-12: 1213 pg/mL — ABNORMAL HIGH (ref 180–914)

## 2023-01-03 LAB — FERRITIN: Ferritin: 67 ng/mL (ref 24–336)

## 2023-01-03 LAB — PREPARE RBC (CROSSMATCH)

## 2023-01-03 MED ORDER — METRONIDAZOLE 500 MG/100ML IV SOLN
500.0000 mg | Freq: Once | INTRAVENOUS | Status: AC
  Administered 2023-01-03: 500 mg via INTRAVENOUS
  Filled 2023-01-03: qty 100

## 2023-01-03 MED ORDER — ACETAMINOPHEN 650 MG RE SUPP: 650.0000 mg | Freq: Four times a day (QID) | RECTAL | Status: DC

## 2023-01-03 MED ORDER — IOHEXOL 300 MG/ML  SOLN
100.0000 mL | Freq: Once | INTRAMUSCULAR | Status: AC | PRN
  Administered 2023-01-03: 100 mL via INTRAVENOUS

## 2023-01-03 MED ORDER — SODIUM CHLORIDE 0.9 % IV SOLN
2.0000 g | Freq: Once | INTRAVENOUS | Status: AC
  Administered 2023-01-03: 2 g via INTRAVENOUS
  Filled 2023-01-03: qty 20

## 2023-01-03 MED ORDER — SODIUM CHLORIDE 0.9 % IV SOLN
2.0000 g | INTRAVENOUS | Status: DC
  Administered 2023-01-04: 2 g via INTRAVENOUS
  Filled 2023-01-03: qty 20

## 2023-01-03 MED ORDER — ONDANSETRON HCL 4 MG/2ML IJ SOLN
4.0000 mg | Freq: Four times a day (QID) | INTRAMUSCULAR | Status: DC | PRN
  Administered 2023-01-03: 4 mg via INTRAVENOUS
  Filled 2023-01-03: qty 2

## 2023-01-03 MED ORDER — SODIUM CHLORIDE 0.9 % IV BOLUS
1000.0000 mL | Freq: Once | INTRAVENOUS | Status: AC
  Administered 2023-01-03: 1000 mL via INTRAVENOUS

## 2023-01-03 MED ORDER — IOHEXOL 350 MG/ML SOLN
100.0000 mL | Freq: Once | INTRAVENOUS | Status: DC | PRN
Start: 2023-01-03 — End: 2023-01-03

## 2023-01-03 MED ORDER — METRONIDAZOLE 500 MG/100ML IV SOLN
500.0000 mg | Freq: Two times a day (BID) | INTRAVENOUS | Status: DC
  Administered 2023-01-03 – 2023-01-04 (×2): 500 mg via INTRAVENOUS
  Filled 2023-01-03 (×2): qty 100

## 2023-01-03 MED ORDER — OXYCODONE HCL 5 MG PO TABS: 5.0000 mg | ORAL_TABLET | Freq: Four times a day (QID) | ORAL | Status: DC | PRN

## 2023-01-03 MED ORDER — SODIUM CHLORIDE 0.9 % IV SOLN: INTRAVENOUS | Status: DC

## 2023-01-03 MED ORDER — PANTOPRAZOLE SODIUM 40 MG PO TBEC
40.0000 mg | DELAYED_RELEASE_TABLET | Freq: Two times a day (BID) | ORAL | Status: DC
  Administered 2023-01-03 – 2023-01-04 (×3): 40 mg via ORAL
  Filled 2023-01-03 (×3): qty 1

## 2023-01-03 MED ORDER — ACETAMINOPHEN 325 MG PO TABS: 650.0000 mg | ORAL_TABLET | Freq: Four times a day (QID) | ORAL | Status: DC | PRN

## 2023-01-03 MED ORDER — ACETAMINOPHEN 325 MG PO TABS
650.0000 mg | ORAL_TABLET | Freq: Four times a day (QID) | ORAL | Status: DC
  Administered 2023-01-03 – 2023-01-04 (×5): 650 mg via ORAL
  Filled 2023-01-03 (×6): qty 2

## 2023-01-03 MED ORDER — SODIUM CHLORIDE 0.9% IV SOLUTION: Freq: Once | INTRAVENOUS | Status: AC

## 2023-01-03 MED ORDER — ACETAMINOPHEN 650 MG RE SUPP: 650.0000 mg | Freq: Four times a day (QID) | RECTAL | Status: DC | PRN

## 2023-01-03 MED ORDER — FLUTICASONE PROPIONATE 50 MCG/ACT NA SUSP
2.0000 | Freq: Every day | NASAL | Status: DC
  Administered 2023-01-03 – 2023-01-04 (×2): 2 via NASAL
  Filled 2023-01-03: qty 16

## 2023-01-03 MED ORDER — FUROSEMIDE 10 MG/ML IJ SOLN
20.0000 mg | Freq: Once | INTRAMUSCULAR | Status: AC
  Administered 2023-01-03: 20 mg via INTRAVENOUS
  Filled 2023-01-03: qty 2

## 2023-01-03 MED ORDER — ONDANSETRON HCL 4 MG PO TABS: 4.0000 mg | ORAL_TABLET | Freq: Four times a day (QID) | ORAL | Status: DC | PRN

## 2023-01-03 MED ORDER — FENTANYL CITRATE PF 50 MCG/ML IJ SOSY
12.5000 ug | PREFILLED_SYRINGE | INTRAMUSCULAR | Status: DC | PRN
  Administered 2023-01-03: 12.5 ug via INTRAVENOUS
  Filled 2023-01-03: qty 1

## 2023-01-03 MED ORDER — TRAZODONE HCL 50 MG PO TABS
25.0000 mg | ORAL_TABLET | Freq: Every evening | ORAL | Status: DC | PRN
  Administered 2023-01-03: 25 mg via ORAL
  Filled 2023-01-03: qty 1

## 2023-01-03 MED ORDER — LORATADINE 10 MG PO TABS: 10.0000 mg | ORAL_TABLET | Freq: Every day | ORAL | Status: DC | PRN

## 2023-01-03 NOTE — ED Triage Notes (Signed)
Pt to ER with wife with c/o "years" of bloody diarrhea.  Pt has appointment for colonoscopy at end of July as well as new PCP at end of July.  Wife also states pt is fatigued, confused. Wife states all concerns are chronic and there is no acute process that prompted today's visit.

## 2023-01-03 NOTE — Progress Notes (Signed)
Patient using home CPAP independently. Made sure unit was setup and ready for use. No issues noted on safety check, water canister filled, and machine plugged into red outlet within patient reach. Told patient to call if he needed any help.

## 2023-01-03 NOTE — Telephone Encounter (Signed)
Hi This patient has procedure scheuled with me. He is just admitted with diveticulitis ,. so it need to be postponed for atleast 2 months   Message sent to endo and pt taken off our schedule.

## 2023-01-03 NOTE — Consult Note (Signed)
Reason for Consult: Acute Diverticulitis   Referring Physician: Dr. Standley Dakins   Cole Brown is an 77 y.o. male.  HPI: Cole Brown. Cole Brown is a 77 yo male with a PMHx of HTN, HLD, fibromyalgia and Raynaud phenomenon and chronic rectal bleeding admitted for diverticulitis with possible perforation. He reports diffuse, constant lower abdominal pain for the past several days. The pain does not radiate and does not improve. He endorsed nausea, decreased appetite and chronic pain related to his fibromyalgia. He denied constipation, diarrhea, vomiting, fevers, shortness of breath, chest pain and weight loss.   The patients wife reported that he has had 2 years of rectal bleeding in which she has had to clean stains out of his underwear, sheets and the floor when it drips. She reported that he has a BM 3-4 times a day. She also reported that he's has been fatigued for some time and it has progressively increased to him spending more time in bed. Additionally, she reported recent short term memory difficulties.The patient denied most of this.   He had his gallbladder removed 30 years ago. His colonoscopy in 2016 showed diverticula. He does not drink alcohol or use tobacco products.   Past Medical History:  Diagnosis Date   Diverticula of colon    2016 colonoscopy   Fibromyalgia    Fibromyalgia    History of GI diverticular bleed    Hypercholesteremia    Hypertension     Past Surgical History:  Procedure Laterality Date   allergy shots  weekly   CHOLECYSTECTOMY     COLONOSCOPY     Dr.Rehman   COLONOSCOPY N/A 01/27/2015   Rehman: multiple diverticula at sigmoid colon,ext hemorrhoids   EYE SURGERY Bilateral    partial cornea transplants    GALLBLADDER SURGERY  12/1993   UPPER GASTROINTESTINAL ENDOSCOPY      Family History  Problem Relation Age of Onset   Heart disease Mother    Pancreatic cancer Mother    Prostate cancer Father    Healthy Sister    Parkinson's disease Brother     Asthma Sister    Healthy Sister     Social History:  reports that he has never smoked. He has never been exposed to tobacco smoke. He has never used smokeless tobacco. He reports current alcohol use. He reports that he does not use drugs.  Allergies:  Allergies  Allergen Reactions   Plaquenil [Hydroxychloroquine Sulfate]     rash   Lodine [Etodolac] Swelling and Rash    Medications: I have reviewed the patient's current medications.  Results for orders placed or performed during the hospital encounter of 01/03/23 (from the past 48 hour(s))  CBC with Differential     Status: Abnormal   Collection Time: 01/03/23  8:25 AM  Result Value Ref Range   WBC 5.5 4.0 - 10.5 K/uL   RBC 2.63 (L) 4.22 - 5.81 MIL/uL   Hemoglobin 7.3 (L) 13.0 - 17.0 g/dL   HCT 16.1 (L) 09.6 - 04.5 %   MCV 89.0 80.0 - 100.0 fL   MCH 27.8 26.0 - 34.0 pg   MCHC 31.2 30.0 - 36.0 g/dL   RDW 40.9 (H) 81.1 - 91.4 %   Platelets 313 150 - 400 K/uL   nRBC 0.0 0.0 - 0.2 %   Neutrophils Relative % 75 %   Neutro Abs 4.1 1.7 - 7.7 K/uL   Lymphocytes Relative 15 %   Lymphs Abs 0.8 0.7 - 4.0 K/uL   Monocytes Relative  8 %   Monocytes Absolute 0.5 0.1 - 1.0 K/uL   Eosinophils Relative 1 %   Eosinophils Absolute 0.1 0.0 - 0.5 K/uL   Basophils Relative 1 %   Basophils Absolute 0.1 0.0 - 0.1 K/uL   Immature Granulocytes 0 %   Abs Immature Granulocytes 0.02 0.00 - 0.07 K/uL    Comment: Performed at [facility], [address] [place], [place] [address]  Comprehensive metabolic panel     Status: Abnormal   Collection Time: 01/03/23  8:25 AM  Result Value Ref Range   Sodium 128 (L) 135 - 145 mmol/L   Potassium 3.7 3.5 - 5.1 mmol/L   Chloride 99 98 - 111 mmol/L   CO2 23 22 - 32 mmol/L   Glucose, Bld 111 (H) 70 - 99 mg/dL    Comment: Glucose reference range applies only to samples taken after fasting for at least 8 hours.   BUN 17 8 - 23 mg/dL   Creatinine, Ser 1.91 0.61 - 1.24 mg/dL   Calcium 8.3 (L) 8.9 - 10.3 mg/dL    Total Protein 6.1 (L) 6.5 - 8.1 g/dL   Albumin 2.5 (L) 3.5 - 5.0 g/dL   AST 15 15 - 41 U/L   ALT 18 0 - 44 U/L   Alkaline Phosphatase 78 38 - 126 U/L   Total Bilirubin 0.5 0.3 - 1.2 mg/dL   GFR, Estimated >47 >82 mL/min    Comment: (NOTE) Calculated using the CKD-EPI Creatinine Equation (2021)    Anion gap 6 5 - 15    Comment: Performed at [facility], [address] [place], [place] [address]  Folate     Status: None   Collection Time: 01/03/23  8:25 AM  Result Value Ref Range   Folate 14.8 >5.9 ng/mL    Comment: Performed at [facility], [address], [place], [place] [address]  Reticulocytes     Status: Abnormal   Collection Time: 01/03/23  8:25 AM  Result Value Ref Range   Retic Ct Pct 1.2 0.4 - 3.1 %   RBC. 2.63 (L) 4.22 - 5.81 MIL/uL   Retic Count, Absolute 30.7 19.0 - 186.0 K/uL   Immature Retic Fract 21.0 (H) 2.3 - 15.9 %    Comment: Performed at [facility], [address] [place], [place] [address]  TSH     Status: None   Collection Time: 01/03/23  8:25 AM  Result Value Ref Range   TSH 0.605 0.350 - 4.500 uIU/mL    Comment: Performed by a 3rd Generation assay with a functional sensitivity of <=0.01 uIU/mL. Performed at [facility], [address] [place], [place] [address]   Type and screen     Status: None   Collection Time: 01/03/23  9:14 AM  Result Value Ref Range   ABO/RH(D) O POS    Antibody Screen NEG    Sample Expiration      01/06/2023,2359 Performed at [facility], [address] [place], [place] [address]     CT ABDOMEN PELVIS W CONTRAST  Result Date: 01/03/2023 CLINICAL DATA:  Abdominal pain, bloody diarrhea EXAM: CT ABDOMEN AND PELVIS WITH CONTRAST TECHNIQUE: Multidetector CT imaging of the abdomen and pelvis was performed using the standard protocol following bolus administration of intravenous contrast. RADIATION DOSE REDUCTION: This exam was performed according to the departmental dose-optimization program which includes automated  exposure control, adjustment of the mA and/or kV according to patient size and/or use of iterative reconstruction technique. CONTRAST:  OMNIPAQUE IOHEXOL  300 MG/ML  SOLN COMPARISON:  01/29/2013 FINDINGS: Lower chest: There is no focal consolidation in the lower lung fields. Left hemidiaphragm is elevated. Coronary artery calcifications are seen. Hepatobiliary: Surgical clips are seen in gallbladder fossa. There is no dilation of bile ducts. Pancreas: No focal abnormalities are seen. Spleen: There are scattered calcified nodules, possibly granulomas. Adrenals/Urinary Tract: Adrenals are unremarkable. There is no hydronephrosis. There is 7.1 cm cyst in the upper pole of left kidney. There are no renal or ureteral stones. There is wall thickening in the left lateral wall of urinary bladder close to the dome. Stomach/Bowel: Small hiatal hernia is seen. Small bowel loops are not dilated. Appendix appears to be within right inguinal hernia. There is no dilation of the appendix. There is large amount of stool in the ascending, transverse and descending colon. The diffuse wall thickening in sigmoid colon in left side of pelvis. Scattered diverticula seen in colon. There is pericolic stranding adjacent to sigmoid colon. There is a 2.2 cm loculated fluid collection in the upper anterior margin of sigmoid colon in the left side of pelvis. There is stranding in the sigmoid mesentery. Rectum is unremarkable. Vascular/Lymphatic: Arterial calcifications are seen in aorta and its major branches. Reproductive: Prostate is enlarged. Other: There is no ascites or pneumoperitoneum. Umbilical hernia containing fat is seen. Bilateral inguinal hernias containing fat noted. Appendix is noted within the right inguinal hernia. Musculoskeletal: Degenerative changes are noted in lumbar spine, more severe at L4-L5 and L5-S1 levels with spinal stenosis and encroachment of neural foramina. IMPRESSION: Diverticulosis of colon. There is  abnormal diffuse wall thickening in proximal sigmoid colon in left side of pelvis with adjacent inflammatory stranding. Findings suggest acute sigmoid diverticulitis. Endoscopy should be considered to rule out any underlying neoplastic process in sigmoid. There is 2.2 cm loculated structure with fluid and pockets of air between the sigmoid colon and the urinary bladder. This may suggest contained perforation. There is wall thickening in the left lateral wall of the urinary bladder. Possibility of extension of inflammation from the sigmoid colon to the urinary bladder wall should be considered. There are no pockets of air within the urinary bladder. There are no definite signs of colovesical fistula. There is no evidence of intestinal obstruction or pneumoperitoneum. There is no hydronephrosis. Appendix is noted within right inguinal hernia without signs of acute inflammation. Small hiatal hernia. Coronary artery disease. Large left renal cyst. Aortic arteriosclerosis. Lumbar spondylosis. Electronically Signed   By: Ernie Avena M.D.   On: 01/03/2023 09:48    ROS:  Pertinent items noted in HPI and remainder of comprehensive ROS otherwise negative.  Blood pressure (!) 143/79, pulse 72, temperature 98.4 F (36.9 C), temperature source Oral, resp. rate 18, height 5\' 11"  (1.803 m), weight 93 kg, SpO2 100%. Physical Exam:   General: Well-appearing, laying in bed in no acute distress.  Respiratory: Clear to auscultation bilaterally with normal work of breathing. Cardiovascular: Regular rate and rhythm. No murmurs.  GI: Soft, non- distended, non tender abdomen with normal active bowel sounds.   Assessment/Plan: Cole Brown. Cole Brown is a 77 yo male admitted for acute diverticulitis with a possible perforation. His vital signs are stable and he his afebrile. His hemoglobin is 7.3, but he is asymptomatic. Continue to monitor for symptoms or hemoglobin <7 for transfusion.  His CT scan showed diffuse sigmoid  wall thickening and a 2 cm loculated structure between the sigmoid colon and the bladder. He is not a candidate for surgery or a percutaneous  drain at this time. Continue ceftriaxone and flagyl while in hospital. He can start a full liquid diet and progress as tolerated. General surgery will follow while in the hospital.   Cole Brown 01/03/2023, 1:03 PM

## 2023-01-03 NOTE — Consult Note (Signed)
Gastroenterology Consult   Referring Provider: No ref. provider found Primary Care Physician:  Elfredia Nevins, MD Primary Gastroenterologist: [name], MD  Patient ID: Cole Brown; [id number]; 10-May-1946   Admit date: 01/03/2023  LOS: 0 days   Date of Consultation: 01/03/2023  Reason for Consultation:  GI bleeding    History of Present Illness   Cole Brown is a 77 y.o. 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 Tasia Catchings in GI clinic for worsening anemia and rectal bleeding.  Patient noted to have recent trending downward of his hemoglobin, down to 9.1 last week.  Iron sat 7%, serum iron 19, no ferritin on file.  Patient has been on oral iron.  Chronic anemia dating back at least 4 to 5 years.  In November his hemoglobin was 11.9.  In May his hemoglobin was 10.8.  Plans for upper endoscopy and colonoscopy given decline in hemoglobin and hematochezia.   Patient presented to the emergency department today, accompanied by his wife, complaining of chronic intermittent blood per rectum,recent diarrhea, worsening fatigue. Patient and wife provide history. Patient has had chronic worsening issues with memory and has not had formal evaluation. Because of his forgetfulness his wife provides some history. Wife has been seeing blood stains in his underwear, on the sheets, and drips in the floor for awhile. He reports typically stools are normal but recently started having loose stool. Typically has BM 3 times per day. He has been having some lower abdominal pain, although initially he forget this when I asked about abdominal pain. He reports good appetite although wife states his appetite has been poor and last night he thought he had eaten dinner but he had not. No vomiting. No heartburn. No dysphagia. He has been feeling lightheaded, short of breath, experiencing  dyspnea on exertion. At baseline he has chronic pain for which she takes Aleve every morning and 2 g of Tylenol every evening.  In the ED: White blood cell count 5500, hemoglobin 7.3, sodium 128, creatinine 0.86, albumin 2.5, folate 14.8.  CT abdomen pelvis with contrast showed abnormal diffuse wall thickening of the proximal sigmoid colon in the left side of the pelvis with adjacent inflammatory stranding.  Findings suggestive of acute sigmoid diverticulitis.  Also with 2.2 cm loculated structure with fluid and pockets of air between the sigmoid colon and the urinary bladder.  This may suggest contained perforation.  Wall thickening in the left lateral wall of the urinary bladder, possibility of extension of inflammation from sigmoid colon to urinary bladder wall would be considered.  Appendix noted in the right inguinal hernia without signs of acute inflammation.  The amount of stool in the ascending, transverse, descending colon.  Colonoscopy August 2016: Multiple diverticula of the sigmoid colon and external hemorrhoids.   Prior to Admission medications   Medication Sig Start Date End Date Taking? Authorizing Provider  acetaminophen (TYLENOL) 325 MG tablet Take 650 mg by mouth every 6 (six) hours as needed. daily   Yes [provider]  albuterol (VENTOLIN HFA) 108 (90 Base) MCG/ACT inhaler Inhale 1 puff into the lungs as needed for wheezing or shortness of breath. 02/03/21  Yes [provider]  amLODipine (NORVASC) 5 MG tablet Take 1 tablet (5 mg total) by mouth daily. 08/28/22  Yes [name], NP  aspirin 81 MG EC tablet Take 1 tablet (81 mg total) by mouth daily. 07/25/21  Yes Dina Rich  F, MD  atenolol (TENORMIN) 25 MG tablet TAKE (1/2) TABLET BY MOUTH ONCE DAILY. Patient taking differently: Take 12.5 mg by mouth daily. 12/03/22  Yes Branch, Dorothe Pea, MD  azelastine (ASTELIN) 0.1 % nasal spray Place 1 spray into both nostrils 2 (two) times daily. 09/17/19  Yes [provider]  Cholecalciferol (VITAMIN D) 50 MCG (2000 UT) CAPS Take 2,000 Units by mouth daily.   Yes [provider]  diphenhydrAMINE (BENADRYL) 25 MG tablet Take 25 mg by mouth as needed for allergies.    Yes [provider]  Devoria Albe (VABYSMO IZ) by Intravitreal route. Monthly   Yes [provider]  Ferrous Sulfate 142 (45 Fe) MG TBCR Take 1 tablet by mouth daily. 11/26/16  Yes [provider]  FLUoxetine (PROZAC) 10 MG tablet Take 10 mg by mouth daily.   Yes [provider]  fluticasone (FLONASE) 50 MCG/ACT nasal spray Place 1-2 sprays into both nostrils daily. 07/27/20  Yes [provider]  folic acid (FOLVITE) 1 MG tablet TAKE 2 TABLETS BY MOUTH DAILY. Patient taking differently: Take 2 mg by mouth daily. 10/12/22  Yes Gearldine Bienenstock, PA-C  hydrOXYzine (ATARAX/VISTARIL) 25 MG tablet Take 25 mg by mouth every 8 (eight) hours as needed for anxiety or itching. 04/15/20  Yes [provider]  Investigational - Study Medication Study name: atorvastatin 40mg  or placebo.   Yes [provider]  levocetirizine (XYZAL) 5 MG tablet Take 5 mg by mouth every evening.    Yes [provider]  methotrexate (RHEUMATREX) 2.5 MG tablet Take 8 tablets (20 mg total) by mouth once a week. 10/25/22  Yes Gearldine Bienenstock, PA-C  montelukast (SINGULAIR) 10 MG tablet Take 10 mg by mouth at bedtime.   Yes [provider]  Multiple Vitamins-Minerals (ICAPS AREDS 2 PO) Take 1 capsule by mouth daily.   Yes [provider]  naproxen sodium (ALEVE) 220 MG tablet Take 220 mg by mouth daily as needed (pain).   Yes [provider]  Omega-3 Fatty Acids (FISH OIL) 1000 MG CAPS Take 1 capsule by mouth daily.   Yes [provider]  Testosterone (ANDROGEL TD) Place onto the skin daily.   Yes [provider]  triazolam (HALCION) 0.25 MG tablet Take 0.25 mg by mouth at bedtime.    Yes [provider]   dicyclomine (BENTYL) 10 MG capsule Take 1 capsule (10 mg total) by mouth daily before breakfast. Patient not taking: Reported on 01/03/2023 07/06/19   Malissa Hippo, MD  EPINEPHrine 0.3 mg/0.3 mL IJ SOAJ injection Inject 0.3 mg into the muscle as needed for anaphylaxis. 10/14/19   [provider]  famotidine (PEPCID) 20 MG tablet Take 20 mg by mouth daily. Patient not taking: Reported on 01/03/2023    [provider]    Current Facility-Administered Medications  Medication Dose Route Frequency Provider Last Rate Last Admin   0.9 %  sodium chloride infusion   Intravenous Continuous Laural Benes, [name], MD 55 mL/hr at 01/03/23 1222 New Bag at 01/03/23 1222   acetaminophen (TYLENOL) tablet 650 mg  650 mg Oral Q6H [name], [name], MD   650 mg at 01/03/23 1236   Or   acetaminophen (TYLENOL) suppository 650 mg  650 mg Rectal Q6H [name], [name], MD       cefTRIAXone (ROCEPHIN) 2 g in sodium chloride 0.9 % 100 mL IVPB  2 g Intravenous Once [name], [name], MD 200 mL/hr at 01/03/23 1223 2 g  at 01/03/23 1223   And   metroNIDAZOLE (FLAGYL) IVPB 500 mg  500 mg Intravenous Once [name], MD       [START ON 01/04/2023] cefTRIAXone (ROCEPHIN) 2 g in sodium chloride 0.9 % 100 mL IVPB  2 g Intravenous Q24H [name], [name], MD       fentaNYL (SUBLIMAZE) injection 12.5 mcg  12.5 mcg Intravenous Q2H PRN [name], [name], MD       metroNIDAZOLE (FLAGYL) IVPB 500 mg  500 mg Intravenous Q12H [name], [name], MD       ondansetron (ZOFRAN) tablet 4 mg  4 mg Oral Q6H PRN [name], [name], MD       Or   ondansetron (ZOFRAN) injection 4 mg  4 mg Intravenous Q6H PRN [name], [name], MD       oxyCODONE (Oxy IR/ROXICODONE) immediate release tablet 5 mg  5 mg Oral Q6H PRN [name], [name], MD       pantoprazole (PROTONIX) EC tablet 40 mg  40 mg Oral BID [name], [name], MD   40 mg at 01/03/23 1236   traZODone (DESYREL) tablet 25 mg  25 mg Oral QHS PRN  Laural Benes, [name], MD        Allergies as of 01/03/2023 - Review Complete 01/03/2023  Allergen Reaction Noted   Plaquenil [hydroxychloroquine sulfate]  11/06/2018   Lodine [etodolac] Swelling and Rash 01/29/2013    Past Medical History:  Diagnosis Date   Diverticula of colon    2016 colonoscopy   Fibromyalgia    Fibromyalgia    History of GI diverticular bleed    Hypercholesteremia    Hypertension     Past Surgical History:  Procedure Laterality Date   allergy shots  weekly   CHOLECYSTECTOMY     COLONOSCOPY     Dr.Rehman   COLONOSCOPY N/A 01/27/2015   Rehman: multiple diverticula at sigmoid colon,ext hemorrhoids   EYE SURGERY Bilateral    partial cornea transplants    GALLBLADDER SURGERY  12/1993   UPPER GASTROINTESTINAL ENDOSCOPY      Family History  Problem Relation Age of Onset   Heart disease Mother    Pancreatic cancer Mother    Prostate cancer Father    Healthy Sister    Parkinson's disease Brother    Asthma Sister    Healthy Sister     Social History   Socioeconomic History   Marital status: Married    Spouse name: Not on file   Number of children: Not on file   Years of education: Not on file   Highest education level: Not on file  Occupational History   Not on file  Tobacco Use   Smoking status: Never    Passive exposure: Never   Smokeless tobacco: Never  Vaping Use   Vaping status: Never Used  Substance and Sexual Activity   Alcohol use: Yes    Alcohol/week: 0.0 standard drinks of alcohol    Comment: occas   Drug use: No   Sexual activity: Not on file  Other Topics Concern   Not on file  Social History Narrative   Not on file   Social Determinants of Health   Financial Resource Strain: Not on file  Food Insecurity: Not on file  Transportation Needs: Not on file  Physical Activity: Not on file  Stress: Not on file  Social Connections: Not on file  Intimate Partner Violence: Not on file     Review of System:   General:  Negative for fever. See hpi Eyes: Negative for vision changes.  ENT: Negative for hoarseness, difficulty swallowing , nasal congestion. CV: Negative for chest pain, angina, palpitations,   peripheral edema. See hpi Respiratory: Negative for dyspnea at rest,  cough, sputum, wheezing. See hpi GI: See history of present illness. GU:  Negative for dysuria, hematuria, urinary incontinence, urinary frequency, nocturnal urination.  MS: Negative for joint pain, low back pain.  Derm: Negative for rash or itching.  Neuro: Negative for weakness, abnormal sensation, seizure, frequent headaches, memory loss, confusion.  Psych: Negative for anxiety, depression, suicidal ideation, hallucinations.  Endo: Negative for unusual weight change.  Heme: Negative for bruising or bleeding. Allergy: Negative for rash or hives.      Physical Examination:   Vital signs in last 24 hours: Temp:  [98.4 F (36.9 C)] 98.4 F (36.9 C) (07/18 1203) Pulse Rate:  [66-72] 72 (07/18 1203) Resp:  [18] 18 (07/18 0813) BP: (127-143)/(68-79) 143/79 (07/18 1203) SpO2:  [98 %-100 %] 100 % (07/18 1203) Weight:  [93 kg] 93 kg (07/18 0813)    General: Well-nourished, well-developed in no acute distress. Wife at bedside. Head: Normocephalic, atraumatic.   Eyes: Conjunctiva pink, no icterus. Mouth: Oropharyngeal mucosa moist and pink , no lesions erythema or exudate. Neck: Supple without thyromegaly, masses, or lymphadenopathy.  Lungs: Clear to auscultation bilaterally.  Heart: Regular rate and rhythm, no murmurs rubs or gallops.  Abdomen: Bowel sounds are normal,  nondistended, no hepatosplenomegaly or masses, no abdominal bruits or hernia , no rebound or guarding.  Moderate tenderness in suprapubic region. Rectal: not performed Extremities: No lower extremity edema, clubbing, deformity.  Neuro: Alert and oriented x 4 , grossly normal neurologically.  Skin: Warm and dry, no rash or jaundice.   Psych: Alert and cooperative,  normal mood and affect.        Intake/Output from previous day: No intake/output data recorded. Intake/Output this shift: No intake/output data recorded.  Lab Results:   CBC Recent Labs    01/03/23 0825  WBC 5.5  HGB 7.3*  HCT 23.4*  MCV 89.0  PLT 313   BMET Recent Labs    01/03/23 0825  NA 128*  K 3.7  CL 99  CO2 23  GLUCOSE 111*  BUN 17  CREATININE 0.86  CALCIUM 8.3*   LFT Recent Labs    01/03/23 0825  BILITOT 0.5  ALKPHOS 78  AST 15  ALT 18  PROT 6.1*  ALBUMIN 2.5*    Lipase No results for input(s): "LIPASE" in the last 72 hours.  PT/INR No results for input(s): "LABPROT", "INR" in the last 72 hours.   Hepatitis Panel No results for input(s): "HEPBSAG", "HCVAB", "HEPAIGM", "HEPBIGM" in the last 72 hours.   Imaging Studies:   CT ABDOMEN PELVIS W CONTRAST  Result Date: 01/03/2023 CLINICAL DATA:  Abdominal pain, bloody diarrhea EXAM: CT ABDOMEN AND PELVIS WITH CONTRAST TECHNIQUE: Multidetector CT imaging of the abdomen and pelvis was performed using the standard protocol following bolus administration of intravenous contrast. RADIATION DOSE REDUCTION: This exam was performed according to the departmental dose-optimization program which includes automated exposure control, adjustment of the mA and/or kV according to patient size and/or use of iterative reconstruction technique. CONTRAST:  OMNIPAQUE IOHEXOL 300 MG/ML  SOLN COMPARISON:  01/29/2013 FINDINGS: Lower chest: There is no focal consolidation in the lower lung fields. Left hemidiaphragm is elevated. Coronary artery calcifications are seen. Hepatobiliary: Surgical clips are seen in gallbladder fossa. There is no dilation of bile ducts.  Pancreas: No focal abnormalities are seen. Spleen: There are scattered calcified nodules, possibly granulomas. Adrenals/Urinary Tract: Adrenals are unremarkable. There is no hydronephrosis. There is 7.1 cm cyst in the upper pole of left kidney. There are no renal or  ureteral stones. There is wall thickening in the left lateral wall of urinary bladder close to the dome. Stomach/Bowel: Small hiatal hernia is seen. Small bowel loops are not dilated. Appendix appears to be within right inguinal hernia. There is no dilation of the appendix. There is large amount of stool in the ascending, transverse and descending colon. The diffuse wall thickening in sigmoid colon in left side of pelvis. Scattered diverticula seen in colon. There is pericolic stranding adjacent to sigmoid colon. There is a 2.2 cm loculated fluid collection in the upper anterior margin of sigmoid colon in the left side of pelvis. There is stranding in the sigmoid mesentery. Rectum is unremarkable. Vascular/Lymphatic: Arterial calcifications are seen in aorta and its major branches. Reproductive: Prostate is enlarged. Other: There is no ascites or pneumoperitoneum. Umbilical hernia containing fat is seen. Bilateral inguinal hernias containing fat noted. Appendix is noted within the right inguinal hernia. Musculoskeletal: Degenerative changes are noted in lumbar spine, more severe at L4-L5 and L5-S1 levels with spinal stenosis and encroachment of neural foramina. IMPRESSION: Diverticulosis of colon. There is abnormal diffuse wall thickening in proximal sigmoid colon in left side of pelvis with adjacent inflammatory stranding. Findings suggest acute sigmoid diverticulitis. Endoscopy should be considered to rule out any underlying neoplastic process in sigmoid. There is 2.2 cm loculated structure with fluid and pockets of air between the sigmoid colon and the urinary bladder. This may suggest contained perforation. There is wall thickening in the left lateral wall of the urinary bladder. Possibility of extension of inflammation from the sigmoid colon to the urinary bladder wall should be considered. There are no pockets of air within the urinary bladder. There are no definite signs of colovesical fistula. There is no  evidence of intestinal obstruction or pneumoperitoneum. There is no hydronephrosis. Appendix is noted within right inguinal hernia without signs of acute inflammation. Small hiatal hernia. Coronary artery disease. Large left renal cyst. Aortic arteriosclerosis. Lumbar spondylosis. Electronically Signed   By: Ernie Avena M.D.   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 Tasia Catchings in GI clinic for worsening anemia and rectal bleeding.  Presenting to the ED with worsening fatigue, poor appetite for several days, ongoing small-volume hematochezia, forgetfulness.  GI consulted for GI bleeding.    Rectal bleeding: Described as small-volume but occurring for years.  Wife cleans drips up off the floor.  Notes blood stains in his underwear and on the sheets.  Could be due to external hemorrhoids but malignancy remains in the differential. He denies chronic diarrhea but has had diarrhea intermittent in the past, and over the past few days. Had planned on outpatient colonoscopy and upper endoscopy because of recently noted acute on chronic anemia and rectal bleeding.  Hemoglobin 7.3 today, had been 9.1 last week, 10.8 in May, 11.9 in November 2023.  Anemia dates back for several years.  Likely multifactorial in part due to anemia of chronic disease and/or medication effect.  Last colonoscopy in 2016 with diverticulosis and external hemorrhoids.  Complicated diverticulitis: Diffuse wall thickening in the proximal sigmoid colon in the left side of the pelvis with adjacent  inflammatory stranding suggestive of acute sigmoid diverticulitis however underlying neoplastic process needs to be ruled out. There is a 2.2 cm loculated structure with fluid and pockets of air between the sigmoid colon and urinary bladder suggestive of contained  perforation.  There is wall thickening in the left lateral wall of the urinary bladder possibly extension of inflammation from sigmoid colon.  Of note, his appendix appears to be within the right inguinal hernia without signs of acute inflammation.  Also had a large amount of stool in the ascending, transverse and descending colon.  Plan:   Agree with IV antibiotics.  Would recommend liquid diet.  Supportive measures.  His colonoscopy will need to be postponed due to concern for contained bowel perforation.  Transfuse as needed.    LOS: 0 days   We would like to thank you for the opportunity to participate in the care of Cole Brown.  Leanna Battles. Dixon Boos [facility] [phone] 7/18/202412:49 PM

## 2023-01-03 NOTE — H&P (Addendum)
History and Physical    Cole Brown DOB: 07/28/1945 DOA: 01/03/2023  PCP: Elfredia Nevins, MD  GI: [facility]   Patient coming from: Home   Chief Complaint: rectal bleeding  HPI: Cole Brown is a 77 y.o. 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  Cole Brown is accompanied by his wife at bedside. Cole Brown notes that he has had years of chronic intermittently bloody, painless diarrhea. He recently saw GI Dr. Tasia Catchings on 7/9 and had an impending outpatient colonoscopy scheduled for the end of July. He denies any worsening in his chronic symptoms. At that office visit his Hgb was 9-10. His wife notes that Cole Brown has had a worse appetite over the last few days, noting she had to throw away most of his dinner last night, which Cole Brown seems to deny.   Over the past few days Cole Brown denies feeling light headed, SOB, or experiencing dyspnea on exertion. He denies any abdominal pain or fevers.  He notes that his fibromyalgia is a baseline chronic pain for which he takes aleve every morning, and 4 Tylenol 500mg  every evening, totaling 2g.    ED Course:  Vitals were wnl on arrival to the ED, however his baseline Hgb of 12 was found to be low at 7.3. Sodium also mildly low at 128. Obtained CT A/P which was remarkable for: diffuse wall thickening in proximal sigmoid colon with stranding and thickening of the adjacent L lateral bladder wall, without evidence of a fistula; and also 2.2cm loculated structure with fluid and air between the sigmoid colon and bladder. ED contacted both General Surgery and GI.  Review of Systems: Reviewed as noted above, otherwise negative.  Past Medical History:  Diagnosis Date   Diverticula of  colon    2016 colonoscopy   Fibromyalgia    Fibromyalgia    History of GI diverticular bleed    Hypercholesteremia    Hypertension     Past Surgical History:  Procedure Laterality Date   allergy shots  weekly   CHOLECYSTECTOMY     COLONOSCOPY     Dr.Rehman   COLONOSCOPY N/A 01/27/2015   Rehman: multiple diverticula at sigmoid colon,ext hemorrhoids   EYE SURGERY Bilateral    partial cornea transplants    GALLBLADDER SURGERY  12/1993   UPPER GASTROINTESTINAL ENDOSCOPY       reports that he has never smoked. He has never been exposed to tobacco smoke. He has never used smokeless tobacco. He reports current alcohol use. He reports that he does not use drugs.  Allergies  Allergen Reactions   Plaquenil [Hydroxychloroquine Sulfate]     rash   Lodine [Etodolac] Swelling and Rash    Family History  Problem Relation Age of Onset   Heart disease Mother    Pancreatic cancer Mother    Prostate cancer Father    Healthy Sister    Parkinson's disease Brother    Asthma Sister    Healthy Sister     Prior to Admission medications   Medication Sig Start Date End Date Taking? Authorizing Provider  acetaminophen (TYLENOL) 325 MG tablet Take 650 mg by mouth every 6 (six) hours as needed. daily    [provider]  albuterol (VENTOLIN HFA) 108 (90 Base) MCG/ACT inhaler Inhale 1 puff into the lungs  as needed. 02/03/21   [provider]  amLODipine (NORVASC) 5 MG tablet Take 1 tablet (5 mg total) by mouth daily. 08/28/22   [name], NP  aspirin 81 MG EC tablet Take 1 tablet (81 mg total) by mouth daily. 07/25/21   [name], MD  atenolol (TENORMIN) 25 MG tablet TAKE (1/2) TABLET BY MOUTH ONCE DAILY. 12/03/22   [name], MD  azelastine (ASTELIN) 0.1 % nasal spray Place 1 spray into both nostrils 2 (two) times daily. 09/17/19   [provider]  Cholecalciferol (VITAMIN D) 50 MCG (2000 UT) CAPS Take 2,000 Units by mouth daily.    [provider]  dicyclomine (BENTYL) 10 MG capsule Take 1 capsule (10 mg total) by mouth daily before breakfast. 07/06/19   Rehman, Joline Maxcy, MD  diphenhydrAMINE (BENADRYL) 25 MG tablet Take 25 mg by mouth as needed for allergies.     [provider]  EPINEPHrine 0.3 mg/0.3 mL IJ SOAJ injection INJECT INTO THIGH ASONEEDED FOR ALLERGIC REACTION. 10/14/19   [provider]  famotidine (PEPCID) 20 MG tablet Take 20 mg by mouth daily.    [provider]  Devoria Albe (VABYSMO IZ) by Intravitreal route. Monthly    [provider]  Ferrous Sulfate 142 (45 Fe) MG TBCR Take 1 tablet by mouth daily. 11/26/16   [provider]  FLUoxetine (PROZAC) 10 MG tablet Take 10 mg by mouth daily.    [provider]  fluticasone Aleda Grana) 50 MCG/ACT nasal spray SMARTSIG:1-2 Spray(s) Both Nares Daily 07/27/20   [provider]  folic acid (FOLVITE) 1 MG tablet TAKE 2 TABLETS BY MOUTH DAILY. 10/12/22   Gearldine Bienenstock, PA-C  hydrocortisone (ANUSOL-HC) 2.5 % rectal cream Place 1 application rectally 2 (two) times daily. 03/30/21   Carlan, Chelsea L, NP  hydrOXYzine (ATARAX/VISTARIL) 25 MG tablet Take 25 mg by mouth every 8 (eight) hours as needed. 04/15/20   [provider]  Investigational - Study Medication Study name: atorvastatin 40mg  or placebo.    [provider]  levocetirizine (XYZAL) 5 MG tablet Take 5 mg by mouth every evening.     [provider]  methotrexate (RHEUMATREX) 2.5 MG tablet Take 8 tablets (20 mg total) by mouth once a week. 10/25/22   Gearldine Bienenstock, PA-C  montelukast (SINGULAIR) 10 MG tablet Take 10 mg by mouth at bedtime.    [provider]  Multiple Vitamins-Minerals (ICAPS AREDS 2 PO) Take 1 capsule by mouth daily.    [provider]  naproxen sodium (ALEVE) 220 MG tablet Take 220 mg by mouth daily as needed.    [provider]  Omega-3 Fatty Acids (FISH OIL) 1000 MG CAPS Take 1 capsule  by mouth daily.    [provider]  Testosterone (ANDROGEL TD) Place onto the skin daily.    [provider]  triazolam (HALCION) 0.25 MG tablet Take 0.25 mg by mouth at bedtime.     [provider]    Physical Exam: Vitals:   01/03/23 0813 01/03/23 0816  BP: 127/68   Pulse: 66   Resp: 18   Temp:  98.4 F (36.9 C)  SpO2: 98%   Weight: 93 kg   Height: 5\' 11"  (1.803 m)     Constitutional: NAD, calm, comfortable Vitals:   01/03/23 0813 01/03/23 0816  BP: 127/68   Pulse: 66   Resp: 18   Temp:  98.4 F (36.9 C)  SpO2: 98%   Weight: 93  kg   Height: 5\' 11"  (1.803 m)    Eyes: lids and conjunctivae normal Neck: normal, supple Respiratory: clear to auscultation bilaterally. Normal respiratory effort. No accessory muscle use.  Cardiovascular: Regular rate and rhythm, no murmurs. Abdomen: no abdominal tenderness, no distention. Bowel sounds positive.  Musculoskeletal:  No edema. Skin: no rashes, lesions, ulcers.  Psychiatric: Responsive affect  Labs on Admission: I have personally reviewed following labs and imaging studies  CBC: Recent Labs  Lab 01/03/23 0825  WBC 5.5  NEUTROABS 4.1  HGB 7.3*  HCT 23.4*  MCV 89.0  PLT 313   Basic Metabolic Panel: Recent Labs  Lab 01/03/23 0825  NA 128*  K 3.7  CL 99  CO2 23  GLUCOSE 111*  BUN 17  CREATININE 0.86  CALCIUM 8.3*   GFR: Estimated Creatinine Clearance: 81.2 mL/min (by C-G formula based on SCr of 0.86 mg/dL). Liver Function Tests: Recent Labs  Lab 01/03/23 0825  AST 15  ALT 18  ALKPHOS 78  BILITOT 0.5  PROT 6.1*  ALBUMIN 2.5*   No results for input(s): "LIPASE", "AMYLASE" in the last 168 hours. No results for input(s): "AMMONIA" in the last 168 hours. Coagulation Profile: No results for input(s): "INR", "PROTIME" in the last 168 hours. Cardiac Enzymes: No results for input(s): "CKTOTAL", "CKMB", "CKMBINDEX", "TROPONINI" in the last 168 hours. BNP (last 3 results) No  results for input(s): "PROBNP" in the last 8760 hours. HbA1C: No results for input(s): "HGBA1C" in the last 72 hours. CBG: No results for input(s): "GLUCAP" in the last 168 hours. Lipid Profile: No results for input(s): "CHOL", "HDL", "LDLCALC", "TRIG", "CHOLHDL", "LDLDIRECT" in the last 72 hours. Thyroid Function Tests: No results for input(s): "TSH", "T4TOTAL", "FREET4", "T3FREE", "THYROIDAB" in the last 72 hours. Anemia Panel: No results for input(s): "VITAMINB12", "FOLATE", "FERRITIN", "TIBC", "IRON", "RETICCTPCT" in the last 72 hours. Urine analysis:    Component Value Date/Time   COLORURINE YELLOW 01/29/2022 1037   APPEARANCEUR CLEAR 01/29/2022 1037   APPEARANCEUR Clear 10/09/2021 1455   LABSPEC 1.017 01/29/2022 1037   PHURINE 6.0 01/29/2022 1037   GLUCOSEU NEGATIVE 01/29/2022 1037   HGBUR NEGATIVE 01/29/2022 1037   BILIRUBINUR negative 02/13/2022 1323   BILIRUBINUR Negative 10/09/2021 1455   KETONESUR negative 02/13/2022 1323   KETONESUR NEGATIVE 01/29/2022 1037   PROTEINUR negative 02/13/2022 1323   PROTEINUR NEGATIVE 01/29/2022 1037   UROBILINOGEN 0.2 02/13/2022 1323   NITRITE Negative 02/13/2022 1323   NITRITE NEGATIVE 01/29/2022 1037   LEUKOCYTESUR Negative 02/13/2022 1323   LEUKOCYTESUR NEGATIVE 01/29/2022 1037    Radiological Exams on Admission: CT ABDOMEN PELVIS W CONTRAST  Result Date: 01/03/2023 CLINICAL DATA:  Abdominal pain, bloody diarrhea EXAM: CT ABDOMEN AND PELVIS WITH CONTRAST TECHNIQUE: Multidetector CT imaging of the abdomen and pelvis was performed using the standard protocol following bolus administration of intravenous contrast. RADIATION DOSE REDUCTION: This exam was performed according to the departmental dose-optimization program which includes automated exposure control, adjustment of the mA and/or kV according to patient size and/or use of iterative reconstruction technique. CONTRAST:  OMNIPAQUE IOHEXOL 300 MG/ML  SOLN COMPARISON:   01/29/2013 FINDINGS: Lower chest: There is no focal consolidation in the lower lung fields. Left hemidiaphragm is elevated. Coronary artery calcifications are seen. Hepatobiliary: Surgical clips are seen in gallbladder fossa. There is no dilation of bile ducts. Pancreas: No focal abnormalities are seen. Spleen: There are scattered calcified nodules, possibly granulomas. Adrenals/Urinary Tract: Adrenals are unremarkable. There is no hydronephrosis. There is 7.1 cm cyst in the  upper pole of left kidney. There are no renal or ureteral stones. There is wall thickening in the left lateral wall of urinary bladder close to the dome. Stomach/Bowel: Small hiatal hernia is seen. Small bowel loops are not dilated. Appendix appears to be within right inguinal hernia. There is no dilation of the appendix. There is large amount of stool in the ascending, transverse and descending colon. The diffuse wall thickening in sigmoid colon in left side of pelvis. Scattered diverticula seen in colon. There is pericolic stranding adjacent to sigmoid colon. There is a 2.2 cm loculated fluid collection in the upper anterior margin of sigmoid colon in the left side of pelvis. There is stranding in the sigmoid mesentery. Rectum is unremarkable. Vascular/Lymphatic: Arterial calcifications are seen in aorta and its major branches. Reproductive: Prostate is enlarged. Other: There is no ascites or pneumoperitoneum. Umbilical hernia containing fat is seen. Bilateral inguinal hernias containing fat noted. Appendix is noted within the right inguinal hernia. Musculoskeletal: Degenerative changes are noted in lumbar spine, more severe at L4-L5 and L5-S1 levels with spinal stenosis and encroachment of neural foramina. IMPRESSION: Diverticulosis of colon. There is abnormal diffuse wall thickening in proximal sigmoid colon in left side of pelvis with adjacent inflammatory stranding. Findings suggest acute sigmoid diverticulitis. Endoscopy should be  considered to rule out any underlying neoplastic process in sigmoid. There is 2.2 cm loculated structure with fluid and pockets of air between the sigmoid colon and the urinary bladder. This may suggest contained perforation. There is wall thickening in the left lateral wall of the urinary bladder. Possibility of extension of inflammation from the sigmoid colon to the urinary bladder wall should be considered. There are no pockets of air within the urinary bladder. There are no definite signs of colovesical fistula. There is no evidence of intestinal obstruction or pneumoperitoneum. There is no hydronephrosis. Appendix is noted within right inguinal hernia without signs of acute inflammation. Small hiatal hernia. Coronary artery disease. Large left renal cyst. Aortic arteriosclerosis. Lumbar spondylosis. Electronically Signed   By: Ernie Avena M.D.   On: 01/03/2023 09:48    EKG: Independently reviewed.   Assessment/Plan Active Problems:   Essential hypertension   Hypercholesteremia   History of diverticulitis   History of iron deficiency anemia   History of sleep apnea   Acute diverticulitis   Hyponatremia   Acute blood loss anemia Baseline Hgb 12, and had been trending down to 9-10 range by 7/9 appointment with GI Dr. Tasia Catchings. Likely secondary to his chronic diarrhea but cannot exclude acute drop due to possible colon perforation seen on 7/18 CT. Presents with Hgb at 7.3, vitals stable and wnl, and without symptoms of light headedness, SOB, or DOE, so will therefore continue to monitor and transfuse if Hgb<7 or becomes symptomatic. Will likely require transfusion. - Will continue to monitor - General Surgery and GI consulted and appreciate recs and management  Infectious colitis vs Diverticulitis with ?perforation Has been pursuing outpatient management of his chronic diarrhea, with planned colonoscopy for late July, and suspicions of IBD. 7/18 CT A/P showed diffuse wall thickening in the  sigmoid colon with stranding, thickening of L lateral bladder wall (no vesicocolonic fistula) and a 2.2cm loculation between sigmoid and bladder. Pt denies symptoms (though appears he may minimize, as wife disagreed with some of his history) and does not have an acute abdomen on exam. Will continue appropriate antibiotic coverage. - Continue Ceftriaxone and Flagyl, initiated 7/18 - mIVF 26ml/hr NS - Prn Oxycodone 5mg  for moderate  pain  - GI and General Surgery consulted and appreciate recs and management  Mild asymptomatic hyponatremia Likely secondary to decreased PO intake (wife reports this, pt denies) over the last few days. Presents with Na at 128, without other electrolyte abnormalities or acute kidney injury. - mIVF with 52ml/hr NS  HTN BP well controlled on outpatient home 5mg  Amlodipine.  - Continue 5mg  Amlodipine  Fibromyalgia with intermittent [name] phenomenon On weekly 20mg  methotrexate. Will need to hold during acute infection. Cole Diebold also takes aleve every morning, and 4x Tylenol 500mg  every night at once. Discussed that we'd rather schedule his Tylenol out, and add prns on top of that, which he is agreeable to. - Hold methotrexate for now - Schedule 500mg  Tylenol QID - Prn Oxycodone 5mg  for moderate pain, as above   DVT prophylaxis: SCDs given concern for active bleeding Code Status: Full Family Communication:  Updated wife at bedside Disposition Plan: Home Consults called: GI, General Surgery. Admission status: Telemetry  Severity of Illness: The appropriate patient status for this patient is INPATIENT. Inpatient status is judged to be reasonable and necessary in order to provide the required intensity of service to ensure the patient's safety. The patient's presenting symptoms, physical exam findings, and initial radiographic and laboratory data in the context of their chronic comorbidities is felt to place them at high risk for further clinical deterioration.  Furthermore, it is not anticipated that the patient will be medically stable for discharge from the hospital within 2 midnights of admission.   * I certify that at the point of admission it is my clinical judgment that the patient will require inpatient hospital care spanning beyond 2 midnights from the point of admission due to high intensity of service, high risk for further deterioration and high frequency of surveillance required.*  Signed,  Marcelline Mates, MS4 Working with Dr. Standley Dakins  ATTENDING NOTE  Patient seen and examined with Cole Brown, Medical student. In addition to supervising the encounter, I played a key role in the decision making process as well as reviewed key findings.   Pt being admitted for diverticulitis and started on IV antibiotics, however his chronic blood loss anemia is worsening and we have asked GI and surgery to consult.  He is hemodynamically stable.  His physical exam is reassuring.  I agree with documentation as noted.  He is having highly symptomatic anemia. Hg way down to 7.2.  Transfuse 2 unit PRBC.  Recheck CBC in AM.   Rodney Langton, MD How to contact the [facility] Attending or Consulting provider 7A - 7P or covering provider during after hours 7P -7A, for this patient?  Check the care team in [facility] and look for a) attending/consulting [facility] provider listed and b) the [facility] team listed Log into [email or web address] and use [facility]'s universal password to access. If you do not have the password, please contact the hospital operator. Locate the [facility] provider you are looking for under [facility] and page to a number that you can be directly reached. If you still have difficulty reaching the provider, please page the [facility] (Director on Call) for the Hospitalists listed on amion for assistance.

## 2023-01-03 NOTE — ED Provider Notes (Signed)
[place] EMERGENCY DEPARTMENT AT [facility] Provider Note   CSN: [id number] Arrival date & time: 01/03/23  0750     History {Add pertinent medical, surgical, social history, OB history to HPI:1} Chief Complaint  Patient presents with   Diarrhea    Cole Brown is a 77 y.o. male.  Patient has had a history of hypertension.  He also has been having rectal bleeding for a number of months that has gotten worse.   Diarrhea      Home Medications Prior to Admission medications   Medication Sig Start Date End Date Taking? Authorizing Provider  acetaminophen (TYLENOL) 325 MG tablet Take 650 mg by mouth every 6 (six) hours as needed. daily    [provider]  albuterol (VENTOLIN HFA) 108 (90 Base) MCG/ACT inhaler Inhale 1 puff into the lungs as needed. 02/03/21   [provider]  amLODipine (NORVASC) 5 MG tablet Take 1 tablet (5 mg total) by mouth daily. 08/28/22   [name], NP  aspirin 81 MG EC tablet Take 1 tablet (81 mg total) by mouth daily. 07/25/21   [name], MD  atenolol (TENORMIN) 25 MG tablet TAKE (1/2) TABLET BY MOUTH ONCE DAILY. 12/03/22   [name], MD  azelastine (ASTELIN) 0.1 % nasal spray Place 1 spray into both nostrils 2 (two) times daily. 09/17/19   [provider]  Cholecalciferol (VITAMIN D) 50 MCG (2000 UT) CAPS Take 2,000 Units by mouth daily.    [provider]  dicyclomine (BENTYL) 10 MG capsule Take 1 capsule (10 mg total) by mouth daily before breakfast. 07/06/19   Rehman, Joline Maxcy, MD  diphenhydrAMINE (BENADRYL) 25 MG tablet Take 25 mg by mouth as needed for allergies.     [provider]  EPINEPHrine 0.3 mg/0.3 mL IJ SOAJ injection INJECT INTO THIGH ASONEEDED FOR ALLERGIC REACTION. 10/14/19   [provider]  famotidine (PEPCID) 20 MG tablet Take 20 mg by mouth daily.    [provider]  Devoria Albe (VABYSMO IZ) by Intravitreal route. Monthly    [provider]  Ferrous Sulfate 142 (45 Fe) MG TBCR Take 1 tablet by mouth daily. 11/26/16   [provider]  FLUoxetine (PROZAC) 10 MG tablet Take 10 mg by mouth daily.    [provider]  fluticasone Aleda Grana) 50 MCG/ACT nasal spray SMARTSIG:1-2 Spray(s) Both Nares Daily 07/27/20   [provider]  folic acid (FOLVITE) 1 MG tablet TAKE 2 TABLETS BY MOUTH DAILY. 10/12/22   Gearldine Bienenstock, PA-C  hydrocortisone (ANUSOL-HC) 2.5 % rectal cream Place 1 application rectally 2 (two) times daily. 03/30/21   Carlan, Chelsea L, NP  hydrOXYzine (ATARAX/VISTARIL) 25 MG tablet Take 25 mg by mouth every 8 (eight) hours as needed. 04/15/20   [provider]  Investigational - Study Medication Study name: atorvastatin 40mg  or placebo.    [provider]  levocetirizine (XYZAL) 5 MG tablet Take 5 mg by mouth every evening.     [provider]  methotrexate (RHEUMATREX) 2.5 MG tablet Take 8 tablets (20 mg total) by mouth once a week. 10/25/22   Gearldine Bienenstock, PA-C  montelukast (SINGULAIR) 10 MG tablet Take 10 mg by mouth at bedtime.    [provider]  Multiple Vitamins-Minerals (ICAPS AREDS 2 PO) Take 1 capsule by mouth daily.    [provider]  naproxen sodium (ALEVE) 220 MG tablet Take 220 mg by mouth daily as needed.    [provider]  Omega-3 Fatty Acids (FISH OIL) 1000 MG CAPS Take 1 capsule by mouth daily.    [provider]  Testosterone (ANDROGEL TD) Place onto the skin daily.    [provider]  triazolam (HALCION) 0.25 MG tablet Take 0.25 mg by mouth at bedtime.     [provider]      Allergies    Plaquenil [hydroxychloroquine sulfate] and Lodine [etodolac]    Review of Systems   Review of Systems  Gastrointestinal:  Positive for diarrhea.    Physical Exam Updated Vital Signs BP 127/68   Pulse 66   Temp 98.4 F (36.9 C)   Resp 18   Ht 5\' 11"  (1.803 m)   Wt 93 kg   SpO2 98%   BMI  28.59 kg/m  Physical Exam  ED Results / Procedures / Treatments   Labs (all labs ordered are listed, but only abnormal results are displayed) Labs Reviewed  CBC WITH DIFFERENTIAL/PLATELET - Abnormal; Notable for the following components:      Result Value   RBC 2.63 (*)    Hemoglobin 7.3 (*)    HCT 23.4 (*)    RDW 16.9 (*)    All other components within normal limits  COMPREHENSIVE METABOLIC PANEL - Abnormal; Notable for the following components:   Sodium 128 (*)    Glucose, Bld 111 (*)    Calcium 8.3 (*)    Total Protein 6.1 (*)    Albumin 2.5 (*)    All other components within normal limits  VITAMIN B12  FOLATE  IRON AND TIBC  FERRITIN  RETICULOCYTES  TSH  TYPE AND SCREEN    EKG None  Radiology CT ABDOMEN PELVIS W CONTRAST  Result Date: 01/03/2023 CLINICAL DATA:  Abdominal pain, bloody diarrhea EXAM: CT ABDOMEN AND PELVIS WITH CONTRAST TECHNIQUE: Multidetector CT imaging of the abdomen and pelvis was performed using the standard protocol following bolus administration of intravenous contrast. RADIATION DOSE REDUCTION: This exam was performed according to the departmental dose-optimization program which includes automated exposure control, adjustment of the mA and/or kV according to patient size and/or use of iterative reconstruction technique. CONTRAST:  OMNIPAQUE IOHEXOL 300 MG/ML  SOLN COMPARISON:  01/29/2013 FINDINGS: Lower chest: There is no focal consolidation in the lower lung fields. Left hemidiaphragm is elevated. Coronary artery calcifications are seen. Hepatobiliary: Surgical clips are seen in gallbladder fossa. There is no dilation of bile ducts. Pancreas: No focal abnormalities are seen. Spleen: There are scattered calcified nodules, possibly granulomas. Adrenals/Urinary Tract: Adrenals are unremarkable. There is no hydronephrosis. There is 7.1 cm cyst in the upper pole of left kidney. There are no renal or ureteral stones. There is wall thickening in the left  lateral wall of urinary bladder close to the dome. Stomach/Bowel: Small hiatal hernia is seen. Small bowel loops are not dilated. Appendix appears to be within right inguinal hernia. There is no dilation of the appendix. There is large amount of stool in the ascending, transverse and descending colon. The diffuse wall thickening in sigmoid colon in left side of pelvis. Scattered diverticula seen in colon. There is pericolic stranding adjacent to sigmoid colon. There is a 2.2 cm loculated fluid collection in the upper anterior margin of sigmoid colon in the left side of pelvis. There is stranding in the sigmoid mesentery. Rectum is unremarkable. Vascular/Lymphatic: Arterial calcifications are seen in aorta and its major branches. Reproductive: Prostate is enlarged. Other: There is no ascites or pneumoperitoneum. Umbilical hernia containing fat is seen.  Bilateral inguinal hernias containing fat noted. Appendix is noted within the right inguinal hernia. Musculoskeletal: Degenerative changes are noted in lumbar spine, more severe at L4-L5 and L5-S1 levels with spinal stenosis and encroachment of neural foramina. IMPRESSION: Diverticulosis of colon. There is abnormal diffuse wall thickening in proximal sigmoid colon in left side of pelvis with adjacent inflammatory stranding. Findings suggest acute sigmoid diverticulitis. Endoscopy should be considered to rule out any underlying neoplastic process in sigmoid. There is 2.2 cm loculated structure with fluid and pockets of air between the sigmoid colon and the urinary bladder. This may suggest contained perforation. There is wall thickening in the left lateral wall of the urinary bladder. Possibility of extension of inflammation from the sigmoid colon to the urinary bladder wall should be considered. There are no pockets of air within the urinary bladder. There are no definite signs of colovesical fistula. There is no evidence of intestinal obstruction or pneumoperitoneum.  There is no hydronephrosis. Appendix is noted within right inguinal hernia without signs of acute inflammation. Small hiatal hernia. Coronary artery disease. Large left renal cyst. Aortic arteriosclerosis. Lumbar spondylosis. Electronically Signed   By: Ernie Avena M.D.   On: 01/03/2023 09:48    Procedures Procedures  {Document cardiac monitor, telemetry assessment procedure when appropriate:1}  Medications Ordered in ED Medications  cefTRIAXone (ROCEPHIN) 2 g in sodium chloride 0.9 % 100 mL IVPB (has no administration in time range)    And  metroNIDAZOLE (FLAGYL) IVPB 500 mg (has no administration in time range)  cefTRIAXone (ROCEPHIN) 2 g in sodium chloride 0.9 % 100 mL IVPB (has no administration in time range)  metroNIDAZOLE (FLAGYL) IVPB 500 mg (has no administration in time range)  0.9 %  sodium chloride infusion (has no administration in time range)  acetaminophen (TYLENOL) tablet 650 mg (has no administration in time range)    Or  acetaminophen (TYLENOL) suppository 650 mg (has no administration in time range)  oxyCODONE (Oxy IR/ROXICODONE) immediate release tablet 5 mg (has no administration in time range)  fentaNYL (SUBLIMAZE) injection 12.5 mcg (has no administration in time range)  traZODone (DESYREL) tablet 25 mg (has no administration in time range)  ondansetron (ZOFRAN) tablet 4 mg (has no administration in time range)    Or  ondansetron (ZOFRAN) injection 4 mg (has no administration in time range)  sodium chloride 0.9 % bolus 1,000 mL (1,000 mLs Intravenous New Bag/Given 01/03/23 0857)  iohexol (OMNIPAQUE) 300 MG/ML solution 100 mL (100 mLs Intravenous Contrast Given 01/03/23 0911)    ED Course/ Medical Decision Making/ A&P   {   Click here for ABCD2, HEART and other calculatorsREFRESH Note before signing :1}                          Medical Decision Making Amount and/or Complexity of Data Reviewed Labs: ordered. Radiology: ordered.  Risk Prescription drug  management. Decision regarding hospitalization.   Patient with diverticulitis with possible contained perforation that is 2.2 cm.  Patient has been admitted to medicine with GI and surgery following  {Document critical care time when appropriate:1} {Document review of labs and clinical decision tools ie heart score, Chads2Vasc2 etc:1}  {Document your independent review of radiology images, and any outside records:1} {Document your discussion with family members, caretakers, and with consultants:1} {Document social determinants of health affecting pt's care:1} {Document your decision making why or why not admission, treatments were needed:1} Final Clinical Impression(s) / ED Diagnoses Final diagnoses:  Diverticulitis  Rx / DC Orders ED Discharge Orders     None

## 2023-01-04 ENCOUNTER — Telehealth: Payer: Self-pay | Admitting: Gastroenterology

## 2023-01-04 DIAGNOSIS — I10 Essential (primary) hypertension: Secondary | ICD-10-CM | POA: Diagnosis not present

## 2023-01-04 DIAGNOSIS — E871 Hypo-osmolality and hyponatremia: Secondary | ICD-10-CM | POA: Diagnosis not present

## 2023-01-04 DIAGNOSIS — K5792 Diverticulitis of intestine, part unspecified, without perforation or abscess without bleeding: Secondary | ICD-10-CM | POA: Diagnosis not present

## 2023-01-04 DIAGNOSIS — Z862 Personal history of diseases of the blood and blood-forming organs and certain disorders involving the immune mechanism: Secondary | ICD-10-CM | POA: Diagnosis not present

## 2023-01-04 DIAGNOSIS — Z8719 Personal history of other diseases of the digestive system: Secondary | ICD-10-CM

## 2023-01-04 LAB — BASIC METABOLIC PANEL
Anion gap: 11 (ref 5–15)
BUN: 13 mg/dL (ref 8–23)
CO2: 22 mmol/L (ref 22–32)
Calcium: 8.4 mg/dL — ABNORMAL LOW (ref 8.9–10.3)
Chloride: 99 mmol/L (ref 98–111)
Creatinine, Ser: 0.79 mg/dL (ref 0.61–1.24)
GFR, Estimated: >60 mL/min (ref >60)
Glucose, Bld: 90 mg/dL (ref 70–99)
Potassium: 3.7 mmol/L (ref 3.5–5.1)
Sodium: 132 mmol/L — ABNORMAL LOW (ref 135–145)

## 2023-01-04 LAB — CBC
HCT: 32 % — ABNORMAL LOW (ref 39.0–52.0)
Hemoglobin: 10.2 g/dL — ABNORMAL LOW (ref 13.0–17.0)
MCH: 27.9 pg (ref 26.0–34.0)
MCHC: 31.9 g/dL (ref 30.0–36.0)
MCV: 87.7 fL (ref 80.0–100.0)
Platelets: 310 10*3/uL (ref 150–400)
RBC: 3.65 MIL/uL — ABNORMAL LOW (ref 4.22–5.81)
RDW: 15.9 % — ABNORMAL HIGH (ref 11.5–15.5)
WBC: 5.5 10*3/uL (ref 4.0–10.5)
nRBC: 0 % (ref 0.0–0.2)

## 2023-01-04 LAB — TYPE AND SCREEN
ABO/RH(D): O POS
Antibody Screen: NEGATIVE
Unit division: 0
Unit division: 0

## 2023-01-04 LAB — BPAM RBC
Blood Product Expiration Date: 202408262359
Blood Product Expiration Date: 202408262359
ISSUE DATE / TIME: 202407181705
ISSUE DATE / TIME: 202407182005
Unit Type and Rh: 5100
Unit Type and Rh: 5100

## 2023-01-04 LAB — MAGNESIUM: Magnesium: 2 mg/dL (ref 1.7–2.4)

## 2023-01-04 MED ORDER — METRONIDAZOLE 500 MG PO TABS: 500.0000 mg | ORAL_TABLET | Freq: Two times a day (BID) | ORAL | 0 refills | Status: AC

## 2023-01-04 MED ORDER — METRONIDAZOLE 500 MG PO TABS: 500.0000 mg | ORAL_TABLET | Freq: Two times a day (BID) | ORAL | 0 refills | Status: DC

## 2023-01-04 MED ORDER — CIPROFLOXACIN HCL 500 MG PO TABS: 500.0000 mg | ORAL_TABLET | Freq: Two times a day (BID) | ORAL | 0 refills | Status: AC

## 2023-01-04 MED ORDER — CIPROFLOXACIN HCL 500 MG PO TABS: 500.0000 mg | ORAL_TABLET | Freq: Two times a day (BID) | ORAL | 0 refills | Status: DC

## 2023-01-04 NOTE — Discharge Instructions (Addendum)
PLEASE HAVE YOUR CBC CHECKED WITH YOUR PRIMARY CARE PROVIDER IN 4 TO 5 DAYS TIME    IMPORTANT INFORMATION: PAY CLOSE ATTENTION   PHYSICIAN DISCHARGE INSTRUCTIONS  Follow with Primary care provider  Elfredia Nevins, MD  and other consultants as instructed by your Hospitalist Physician  SEEK MEDICAL CARE OR RETURN TO EMERGENCY ROOM IF SYMPTOMS COME BACK, WORSEN OR NEW PROBLEM DEVELOPS   Please note: You were cared for by a hospitalist during your hospital stay. Every effort will be made to forward records to your primary care provider.  You can request that your primary care provider send for your hospital records if they have not received them.  Once you are discharged, your primary care physician will handle any further medical issues. Please note that NO REFILLS for any discharge medications will be authorized once you are discharged, as it is imperative that you return to your primary care physician (or establish a relationship with a primary care physician if you do not have one) for your post hospital discharge needs so that they can reassess your need for medications and monitor your lab values.  Please get a complete blood count and chemistry panel checked by your Primary MD at your next visit, and again as instructed by your Primary MD.  Get Medicines reviewed and adjusted: Please take all your medications with you for your next visit with your Primary MD  Laboratory/radiological data: Please request your Primary MD to go over all hospital tests and procedure/radiological results at the follow up, please ask your primary care provider to get all Hospital records sent to his/her office.  In some cases, they will be blood work, cultures and biopsy results pending at the time of your discharge. Please request that your primary care provider follow up on these results.  If you are diabetic, please bring your blood sugar readings with you to your follow up appointment with primary care.     Please call and make your follow up appointments as soon as possible.    Also Note the following: If you experience worsening of your admission symptoms, develop shortness of breath, life threatening emergency, suicidal or homicidal thoughts you must seek medical attention immediately by calling 911 or calling your MD immediately  if symptoms less severe.  You must read complete instructions/literature along with all the possible adverse reactions/side effects for all the Medicines you take and that have been prescribed to you. Take any new Medicines after you have completely understood and accpet all the possible adverse reactions/side effects.   Do not drive when taking Pain medications or sleeping medications (Benzodiazepines)  Do not take more than prescribed Pain, Sleep and Anxiety Medications. It is not advisable to combine anxiety,sleep and pain medications without talking with your primary care practitioner  Special Instructions: If you have smoked or chewed Tobacco  in the last 2 yrs please stop smoking, stop any regular Alcohol  and or any Recreational drug use.  Wear Seat belts while driving.  Do not drive if taking any narcotic, mind altering or controlled substances or recreational drugs or alcohol.

## 2023-01-04 NOTE — Progress Notes (Signed)
Gastroenterology Progress Note   Referring Provider: No ref. provider found Primary Care Physician:  Elfredia Nevins, MD Primary Gastroenterologist:  Dr. Tasia Catchings  Patient ID: Cole Brown; [id number]; 06/25/1945    Subjective   Denies any abdominal pain. Tolerating liquid diet, interested in advancing diet as recommended by surgery. Denies N/V.    Objective   Vital signs in last 24 hours Temp:  [97.7 F (36.5 C)-98.3 F (36.8 C)] 98 F (36.7 C) (07/19 0404) Pulse Rate:  [65-77] 70 (07/19 0404) Resp:  [18-20] 18 (07/19 0404) BP: (121-148)/(68-79) 133/73 (07/19 0404) SpO2:  [94 %-100 %] 98 % (07/19 0404) Weight:  [83 kg] 83 kg (07/19 0404) Last BM Date : 01/03/23  Physical Exam General:   Alert and oriented, pleasant. NAD. Sitting in chair.  Head:  Normocephalic and atraumatic. Eyes:  No icterus, sclera clear. Conjuctiva pink.  Mouth:  Without lesions, mucosa pink and moist.  Neck:  Supple, without thyromegaly or masses.  Abdomen:  Bowel sounds present, soft, non-tender, non-distended. No HSM or hernias noted. No rebound or guarding. No masses appreciated  Msk:  Symmetrical without gross deformities. Normal posture. Extremities:  Without clubbing or edema. Neurologic:  Alert and  oriented x4;  grossly normal neurologically. Skin:  Warm and dry, intact without significant lesions.  Psych:  Alert and cooperative. Normal mood and affect.  Intake/Output from previous day: 07/18 0701 - 07/19 0700 In: 1850.8 [P.O.:240; I.V.:153.1; Blood:276; IV Piggyback:1181.7] Out: -  Intake/Output this shift: Total I/O In: 1166.8 [P.O.:240; I.V.:926.8] Out: -   Lab Results  Recent Labs    01/03/23 0825 01/04/23 0418  WBC 5.5 5.5  HGB 7.3* 10.2*  HCT 23.4* 32.0*  PLT 313 310   BMET Recent Labs    01/03/23 0825 01/04/23 0418  NA 128* 132*  K 3.7 3.7  CL 99 99  CO2 23 22  GLUCOSE 111* 90  BUN 17 13  CREATININE 0.86 0.79  CALCIUM 8.3* 8.4*   LFT Recent Labs     01/03/23 0825  PROT 6.1*  ALBUMIN 2.5*  AST 15  ALT 18  ALKPHOS 78  BILITOT 0.5   PT/INR No results for input(s): "LABPROT", "INR" in the last 72 hours. Hepatitis Panel No results for input(s): "HEPBSAG", "HCVAB", "HEPAIGM", "HEPBIGM" in the last 72 hours.   Studies/Results CT ABDOMEN PELVIS W CONTRAST  Result Date: 01/03/2023 CLINICAL DATA:  Abdominal pain, bloody diarrhea EXAM: CT ABDOMEN AND PELVIS WITH CONTRAST TECHNIQUE: Multidetector CT imaging of the abdomen and pelvis was performed using the standard protocol following bolus administration of intravenous contrast. RADIATION DOSE REDUCTION: This exam was performed according to the departmental dose-optimization program which includes automated exposure control, adjustment of the mA and/or kV according to patient size and/or use of iterative reconstruction technique. CONTRAST:  OMNIPAQUE IOHEXOL 300 MG/ML  SOLN COMPARISON:  01/29/2013 FINDINGS: Lower chest: There is no focal consolidation in the lower lung fields. Left hemidiaphragm is elevated. Coronary artery calcifications are seen. Hepatobiliary: Surgical clips are seen in gallbladder fossa. There is no dilation of bile ducts. Pancreas: No focal abnormalities are seen. Spleen: There are scattered calcified nodules, possibly granulomas. Adrenals/Urinary Tract: Adrenals are unremarkable. There is no hydronephrosis. There is 7.1 cm cyst in the upper pole of left kidney. There are no renal or ureteral stones. There is wall thickening in the left lateral wall of urinary bladder close to the dome. Stomach/Bowel: Small hiatal hernia is seen. Small bowel loops are not dilated. Appendix appears to be  within right inguinal hernia. There is no dilation of the appendix. There is large amount of stool in the ascending, transverse and descending colon. The diffuse wall thickening in sigmoid colon in left side of pelvis. Scattered diverticula seen in colon. There is pericolic stranding adjacent to  sigmoid colon. There is a 2.2 cm loculated fluid collection in the upper anterior margin of sigmoid colon in the left side of pelvis. There is stranding in the sigmoid mesentery. Rectum is unremarkable. Vascular/Lymphatic: Arterial calcifications are seen in aorta and its major branches. Reproductive: Prostate is enlarged. Other: There is no ascites or pneumoperitoneum. Umbilical hernia containing fat is seen. Bilateral inguinal hernias containing fat noted. Appendix is noted within the right inguinal hernia. Musculoskeletal: Degenerative changes are noted in lumbar spine, more severe at L4-L5 and L5-S1 levels with spinal stenosis and encroachment of neural foramina. IMPRESSION: Diverticulosis of colon. There is abnormal diffuse wall thickening in proximal sigmoid colon in left side of pelvis with adjacent inflammatory stranding. Findings suggest acute sigmoid diverticulitis. Endoscopy should be considered to rule out any underlying neoplastic process in sigmoid. There is 2.2 cm loculated structure with fluid and pockets of air between the sigmoid colon and the urinary bladder. This may suggest contained perforation. There is wall thickening in the left lateral wall of the urinary bladder. Possibility of extension of inflammation from the sigmoid colon to the urinary bladder wall should be considered. There are no pockets of air within the urinary bladder. There are no definite signs of colovesical fistula. There is no evidence of intestinal obstruction or pneumoperitoneum. There is no hydronephrosis. Appendix is noted within right inguinal hernia without signs of acute inflammation. Small hiatal hernia. Coronary artery disease. Large left renal cyst. Aortic arteriosclerosis. Lumbar spondylosis. Electronically Signed   By: Ernie Avena M.D.   On: 01/03/2023 09:48    Assessment  77 y.o. 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   Rectal bleeding, anemia: Low volume bleeding reportedly occurring for several years. Etiology unclear at this time. Last colonoscopy in 2016 with external hemorrhoids and diverticulosis. Bleeding could be occurring in the setting of hemorrhoids but unable to exclude malignant process. Hgb 7.3 on admission which has been down trending since November 2023 although with chronic anemia for many years. Anemia likely multifactorial in the setting of mild rectal bleeding, chronic disease, and medications. Received 1u PRBC and hgb up to 10.2 today. No BM since admission.  Complicated diverticulitis: CT this admission with diffuse wall thickening in the proximal sigmoid colon and left pelvis with adjacent inflammatory stranding suggestive of acute sigmoid diverticulitis but unable to exclude neoplastic process. Also with 2.2 cm loculated area with fluid and air pockets between bladder and sigmoid suggestive possible contained perforation. Left bladder wall thickening also noted, possible extension of inflammation from sigmoid. Currently receiving IV rocephin and flagyl.  Given contained possible perforation will extend antibiotic course and ensure he receives 2 full weeks of antibiotics. Verbalized this recommendation with hospitalist.   Plan / Recommendations  Complete total 14 days of antibiotic therapy Soft, low fiber diet for a few weeks Repeat CBC in 1 week.  Monitor for overt rectal bleeding If tolerating diet likely can d/c in the next 24 hours.  Follow up office visit in 3-4 weeks.  LOS: 1 day    01/04/2023, 1:36 PM   Brooke Bonito, MSN, FNP-BC, AGACNP-BC [facility]

## 2023-01-04 NOTE — Discharge Summary (Addendum)
Physician Discharge Summary  MARKUS CASTEN JYN:[id number] DOB: 01-30-1946 DOA: 01/03/2023  PCP: Elfredia Nevins, MD  Admit date: 01/03/2023  Discharge date: 01/04/2023  Admitted From: Home  Disposition:  Home  Recommendations for Outpatient Follow-up:  Follow up with PCP in 1 week Please obtain BMP/CBC in one week Please follow up on the following pending results: None at this time Continue PO antibiotics, Ciprofloxacin BID and Metronidazole BID for 13 days, 7/20-8/1 Follow up with GI, Dr. Tasia Catchings in a few weeks (they will set up with you) and with colonoscopy in roughly 8 weeks  Home Health: None  Equipment/Devices: None  Discharge Condition:Stable  CODE STATUS: Full  Diet recommendation: Heart Healthy  Brief/Interim Summary: NATE COMMON is a 77 y.o. male with medical history significant for HTN, inflammatory arthritis, fibromyalgia with intermittent Raynaud phenomenon on PO Methotrexate, chronic painless bloody diarrhea (pursuing active outpatient workup), and APLA positive presents with progressive fatigue and confusion in the setting of chronic bloody diarrhea and was admitted for complicated diverticulitis with bleeding.  Mr Stanforth notes that he has had years of chronic intermittently bloody, painless diarrhea. He recently saw GI Dr. Tasia Catchings on 7/9 and had an impending outpatient colonoscopy scheduled for the end of July.   Vitals were wnl on arrival to the ED, however his baseline Hgb of 12 was found to be low at 7.3, so was given 2u pRBCs. Sodium was also mildly low at 128. Obtained CT A/P which was remarkable for: diffuse wall thickening in proximal sigmoid colon with stranding and thickening of the adjacent L lateral bladder wall, without evidence of a fistula; and also 2.2cm loculated structure with fluid and air between the sigmoid colon and bladder. Mr Duque was seen by GI and General Surgery, who suggested continuing IV antibiotics, followed by colonoscopy in 8 weeks as  outpatient. On hospital day 1 he was initiated on full liquid diet. Today, 7/19, his Hgb is up from 7.3 to 10.2 s/p 2u, and Na improved to 132, while continuing on Ceftriaxone and Flagyl. Endorsed one bowel movement yesterday, denies bleeding. Exam generally benign. Pt tolerated soft diet very well today and very much would like to go home today, which was reasonable. Discharging with two week course of PO Ciprofloxacin and Metronidazole BID, ending 8/1.  Discharge Diagnoses:  Principal Problem:   Acute diverticulitis Active Problems:   Essential hypertension   Hypercholesteremia   History of diverticulitis   History of iron deficiency anemia   History of sleep apnea   Hyponatremia    Discharge Instructions   Allergies as of 01/04/2023       Reactions   Plaquenil [hydroxychloroquine Sulfate]    rash   Lodine [etodolac] Swelling, Rash        Medication List     STOP taking these medications    dicyclomine 10 MG capsule Commonly known as: Bentyl   famotidine 20 MG tablet Commonly known as: PEPCID       TAKE these medications    acetaminophen 325 MG tablet Commonly known as: TYLENOL Take 650 mg by mouth every 6 (six) hours as needed. daily   albuterol 108 (90 Base) MCG/ACT inhaler Commonly known as: VENTOLIN HFA Inhale 1 puff into the lungs as needed for wheezing or shortness of breath.   amLODipine 5 MG tablet Commonly known as: NORVASC Take 1 tablet (5 mg total) by mouth daily.   ANDROGEL TD Place onto the skin daily.   aspirin EC 81 MG tablet Take 1 tablet (81  mg total) by mouth daily.   atenolol 25 MG tablet Commonly known as: TENORMIN TAKE (1/2) TABLET BY MOUTH ONCE DAILY. What changed: See the new instructions.   azelastine 0.1 % nasal spray Commonly known as: ASTELIN Place 1 spray into both nostrils 2 (two) times daily.   ciprofloxacin 500 MG tablet Commonly known as: Cipro Take 1 tablet (500 mg total) by mouth 2 (two) times daily for 13  days. Start taking on: January 05, 2023   diphenhydrAMINE 25 MG tablet Commonly known as: BENADRYL Take 25 mg by mouth as needed for allergies.   EPINEPHrine 0.3 mg/0.3 mL Soaj injection Commonly known as: EPI-PEN Inject 0.3 mg into the muscle as needed for anaphylaxis.   Ferrous Sulfate 142 (45 Fe) MG Tbcr Take 1 tablet by mouth daily.   Fish Oil 1000 MG Caps Take 1 capsule by mouth daily.   FLUoxetine 10 MG tablet Commonly known as: PROZAC Take 10 mg by mouth daily.   fluticasone 50 MCG/ACT nasal spray Commonly known as: FLONASE Place 1-2 sprays into both nostrils daily.   folic acid 1 MG tablet Commonly known as: FOLVITE TAKE 2 TABLETS BY MOUTH DAILY.   hydrOXYzine 25 MG tablet Commonly known as: ATARAX Take 25 mg by mouth every 8 (eight) hours as needed for anxiety or itching.   ICAPS AREDS 2 PO Take 1 capsule by mouth daily.   Investigational - Study Medication Study name: atorvastatin 40mg  or placebo.   levocetirizine 5 MG tablet Commonly known as: XYZAL Take 5 mg by mouth every evening.   methotrexate 2.5 MG tablet Commonly known as: RHEUMATREX Take 8 tablets (20 mg total) by mouth once a week.   metroNIDAZOLE 500 MG tablet Commonly known as: Flagyl Take 1 tablet (500 mg total) by mouth 2 (two) times daily with a meal for 13 days. DO NOT CONSUME ALCOHOL WHILE TAKING THIS MEDICATION.   montelukast 10 MG tablet Commonly known as: SINGULAIR Take 10 mg by mouth at bedtime.   naproxen sodium 220 MG tablet Commonly known as: ALEVE Take 220 mg by mouth daily as needed (pain).   triazolam 0.25 MG tablet Commonly known as: HALCION Take 0.25 mg by mouth at bedtime.   VABYSMO IZ by Intravitreal route. Monthly   Vitamin D 50 MCG (2000 UT) Caps Take 2,000 Units by mouth daily.        Follow-up Information     Elfredia Nevins, MD. Schedule an appointment as soon as possible for a visit in 5 day(s).   Specialty: Internal Medicine Why: Hospital Follow  Up to have CBC checked Contact information: [address] [place] [address] [phone]         [facility]. Schedule an appointment as soon as possible for a visit in 1 week(s).   Why: Hospital Follow Up Contact information: [address] [place] [address] [phone]               Allergies  Allergen Reactions   Plaquenil [Hydroxychloroquine Sulfate]     rash   Lodine [Etodolac] Swelling and Rash    Consultations: Gastroenterology General Surgery   Procedures/Studies: CT ABDOMEN PELVIS W CONTRAST  Result Date: 01/03/2023 CLINICAL DATA:  Abdominal pain, bloody diarrhea EXAM: CT ABDOMEN AND PELVIS WITH CONTRAST TECHNIQUE: Multidetector CT imaging of the abdomen and pelvis was performed using the standard protocol following bolus administration of intravenous contrast. RADIATION DOSE REDUCTION: This exam was performed according to the departmental dose-optimization program which includes automated exposure control,  adjustment of the mA and/or kV according to patient size and/or use of iterative reconstruction technique. CONTRAST:  OMNIPAQUE IOHEXOL 300 MG/ML  SOLN COMPARISON:  01/29/2013 FINDINGS: Lower chest: There is no focal consolidation in the lower lung fields. Left hemidiaphragm is elevated. Coronary artery calcifications are seen. Hepatobiliary: Surgical clips are seen in gallbladder fossa. There is no dilation of bile ducts. Pancreas: No focal abnormalities are seen. Spleen: There are scattered calcified nodules, possibly granulomas. Adrenals/Urinary Tract: Adrenals are unremarkable. There is no hydronephrosis. There is 7.1 cm cyst in the upper pole of left kidney. There are no renal or ureteral stones. There is wall thickening in the left lateral wall of urinary bladder close to the dome. Stomach/Bowel: Small hiatal hernia is seen. Small bowel loops are not dilated. Appendix appears to be within right  inguinal hernia. There is no dilation of the appendix. There is large amount of stool in the ascending, transverse and descending colon. The diffuse wall thickening in sigmoid colon in left side of pelvis. Scattered diverticula seen in colon. There is pericolic stranding adjacent to sigmoid colon. There is a 2.2 cm loculated fluid collection in the upper anterior margin of sigmoid colon in the left side of pelvis. There is stranding in the sigmoid mesentery. Rectum is unremarkable. Vascular/Lymphatic: Arterial calcifications are seen in aorta and its major branches. Reproductive: Prostate is enlarged. Other: There is no ascites or pneumoperitoneum. Umbilical hernia containing fat is seen. Bilateral inguinal hernias containing fat noted. Appendix is noted within the right inguinal hernia. Musculoskeletal: Degenerative changes are noted in lumbar spine, more severe at L4-L5 and L5-S1 levels with spinal stenosis and encroachment of neural foramina. IMPRESSION: Diverticulosis of colon. There is abnormal diffuse wall thickening in proximal sigmoid colon in left side of pelvis with adjacent inflammatory stranding. Findings suggest acute sigmoid diverticulitis. Endoscopy should be considered to rule out any underlying neoplastic process in sigmoid. There is 2.2 cm loculated structure with fluid and pockets of air between the sigmoid colon and the urinary bladder. This may suggest contained perforation. There is wall thickening in the left lateral wall of the urinary bladder. Possibility of extension of inflammation from the sigmoid colon to the urinary bladder wall should be considered. There are no pockets of air within the urinary bladder. There are no definite signs of colovesical fistula. There is no evidence of intestinal obstruction or pneumoperitoneum. There is no hydronephrosis. Appendix is noted within right inguinal hernia without signs of acute inflammation. Small hiatal hernia. Coronary artery disease. Large  left renal cyst. Aortic arteriosclerosis. Lumbar spondylosis. Electronically Signed   By: Ernie Avena M.D.   On: 01/03/2023 09:48     Discharge Exam: Vitals:   01/03/23 2317 01/04/23 0404  BP: 128/77 133/73  Pulse: 65 70  Resp: 18 18  Temp: 98.3 F (36.8 C) 98 F (36.7 C)  SpO2: 96% 98%   Vitals:   01/03/23 2035 01/03/23 2100 01/03/23 2317 01/04/23 0404  BP: 127/79 121/75 128/77 133/73  Pulse: 70 70 65 70  Resp: 18 18 18 18   Temp: 98.2 F (36.8 C) 98.3 F (36.8 C) 98.3 F (36.8 C) 98 F (36.7 C)  TempSrc: Oral Oral Oral Oral  SpO2: 97% 94% 96% 98%  Weight:    83 kg  Height:        General: Pt is alert, awake, not in acute distress Cardiovascular: RRR, S1/S2 +, no rubs, no gallops Respiratory: CTA bilaterally, no wheezing, no rhonchi Abdominal: Soft, mildly tender to moderate  palpation over LLQ, ND, bowel sounds + Extremities: no edema, no cyanosis    The results of significant diagnostics from this hospitalization (including imaging, microbiology, ancillary and laboratory) are listed below for reference.     Microbiology: No results found for this or any previous visit (from the past 240 hour(s)).   Labs: BNP (last 3 results) No results for input(s): "BNP" in the last 8760 hours. Basic Metabolic Panel: Recent Labs  Lab 01/03/23 0825 01/04/23 0418  NA 128* 132*  K 3.7 3.7  CL 99 99  CO2 23 22  GLUCOSE 111* 90  BUN 17 13  CREATININE 0.86 0.79  CALCIUM 8.3* 8.4*  MG  --  2.0   Liver Function Tests: Recent Labs  Lab 01/03/23 0825  AST 15  ALT 18  ALKPHOS 78  BILITOT 0.5  PROT 6.1*  ALBUMIN 2.5*   No results for input(s): "LIPASE", "AMYLASE" in the last 168 hours. No results for input(s): "AMMONIA" in the last 168 hours. CBC: Recent Labs  Lab 01/03/23 0825 01/04/23 0418  WBC 5.5 5.5  NEUTROABS 4.1  --   HGB 7.3* 10.2*  HCT 23.4* 32.0*  MCV 89.0 87.7  PLT 313 310   Cardiac Enzymes: No results for input(s): "CKTOTAL", "CKMB",  "CKMBINDEX", "TROPONINI" in the last 168 hours. BNP: Invalid input(s): "POCBNP" CBG: No results for input(s): "GLUCAP" in the last 168 hours. D-Dimer No results for input(s): "DDIMER" in the last 72 hours. Hgb A1c No results for input(s): "HGBA1C" in the last 72 hours. Lipid Profile No results for input(s): "CHOL", "HDL", "LDLCALC", "TRIG", "CHOLHDL", "LDLDIRECT" in the last 72 hours. Thyroid function studies Recent Labs    01/03/23 0825  TSH 0.605   Anemia work up Recent Labs    01/03/23 0825  VITAMINB12 1,213*  FOLATE 14.8  FERRITIN 67  TIBC 265  IRON 16*  RETICCTPCT 1.2   Urinalysis    Component Value Date/Time   COLORURINE YELLOW 01/29/2022 1037   APPEARANCEUR CLEAR 01/29/2022 1037   APPEARANCEUR Clear 10/09/2021 1455   LABSPEC 1.017 01/29/2022 1037   PHURINE 6.0 01/29/2022 1037   GLUCOSEU NEGATIVE 01/29/2022 1037   HGBUR NEGATIVE 01/29/2022 1037   BILIRUBINUR negative 02/13/2022 1323   BILIRUBINUR Negative 10/09/2021 1455   KETONESUR negative 02/13/2022 1323   KETONESUR NEGATIVE 01/29/2022 1037   PROTEINUR negative 02/13/2022 1323   PROTEINUR NEGATIVE 01/29/2022 1037   UROBILINOGEN 0.2 02/13/2022 1323   NITRITE Negative 02/13/2022 1323   NITRITE NEGATIVE 01/29/2022 1037   LEUKOCYTESUR Negative 02/13/2022 1323   LEUKOCYTESUR NEGATIVE 01/29/2022 1037   Sepsis Labs Recent Labs  Lab 01/03/23 0825 01/04/23 0418  WBC 5.5 5.5   Microbiology No results found for this or any previous visit (from the past 240 hour(s)).   Time coordinating discharge: 35 minutes  SIGNED:  Marcelline Mates, MS4 Working with Dr. Standley Dakins  ATTENDING NOTE  Patient seen and examined with Desiree Lucy, Medical student. In addition to supervising the encounter, I played a key role in the decision making process as well as reviewed key findings.  Pt has been ambulating the halls with his wife and he has been eating well and not having any pain or black stools. He feels much  better after receiving 2 units PRBCs.  GI team recommending 2 weeks of antibiotic therapy.  He is stable to discharge home. GI arranging CBC in 1 week and outpatient follow up.  I agree with documentation as noted.    Rodney Langton, MD  How to contact the [facility] Attending or Consulting provider 7A - 7P or covering provider during after hours 7P -7A, for this patient?  Check the care team in [facility] and look for a) attending/consulting [facility] provider listed and b) the [facility] team listed Log into [email or web address] and use [facility] universal password to access. If you do not have the password, please contact the hospital operator. Locate the [facility] provider you are looking for under [facility] and page to a number that you can be directly reached. If you still have difficulty reaching the provider, please page the [facility] (Director on Call) for the Hospitalists listed on amion for assistance.

## 2023-01-04 NOTE — Progress Notes (Signed)
Subjective: No significant abdominal pain.  No significant bloody bowel movement since yesterday.  No nausea, vomiting.  Objective: Vital signs in last 24 hours: Temp:  [97.7 F (36.5 C)-98.4 F (36.9 C)] 98 F (36.7 C) (07/19 0404) Pulse Rate:  [65-77] 70 (07/19 0404) Resp:  [18-20] 18 (07/19 0404) BP: (121-148)/(68-79) 133/73 (07/19 0404) SpO2:  [94 %-100 %] 98 % (07/19 0404) Weight:  [83 kg] 83 kg (07/19 0404) Last BM Date : 01/03/23  Intake/Output from previous day: 07/18 0701 - 07/19 0700 In: 1850.8 [P.O.:240; I.V.:153.1; Blood:276; IV Piggyback:1181.7] Out: -  Intake/Output this shift: No intake/output data recorded.  General appearance: alert, cooperative, and no distress GI: Soft, with minimal tenderness to deep palpation in left lower quadrant.  No rigidity noted.  Lab Results:  Recent Labs    01/03/23 0825 01/04/23 0418  WBC 5.5 5.5  HGB 7.3* 10.2*  HCT 23.4* 32.0*  PLT 313 310   BMET Recent Labs    01/03/23 0825 01/04/23 0418  NA 128* 132*  K 3.7 3.7  CL 99 99  CO2 23 22  GLUCOSE 111* 90  BUN 17 13  CREATININE 0.86 0.79  CALCIUM 8.3* 8.4*   PT/INR No results for input(s): "LABPROT", "INR" in the last 72 hours.  Studies/Results: CT ABDOMEN PELVIS W CONTRAST  Result Date: 01/03/2023 CLINICAL DATA:  Abdominal pain, bloody diarrhea EXAM: CT ABDOMEN AND PELVIS WITH CONTRAST TECHNIQUE: Multidetector CT imaging of the abdomen and pelvis was performed using the standard protocol following bolus administration of intravenous contrast. RADIATION DOSE REDUCTION: This exam was performed according to the departmental dose-optimization program which includes automated exposure control, adjustment of the mA and/or kV according to patient size and/or use of iterative reconstruction technique. CONTRAST:  OMNIPAQUE IOHEXOL 300 MG/ML  SOLN COMPARISON:  01/29/2013 FINDINGS: Lower chest: There is no focal consolidation in the lower lung fields. Left  hemidiaphragm is elevated. Coronary artery calcifications are seen. Hepatobiliary: Surgical clips are seen in gallbladder fossa. There is no dilation of bile ducts. Pancreas: No focal abnormalities are seen. Spleen: There are scattered calcified nodules, possibly granulomas. Adrenals/Urinary Tract: Adrenals are unremarkable. There is no hydronephrosis. There is 7.1 cm cyst in the upper pole of left kidney. There are no renal or ureteral stones. There is wall thickening in the left lateral wall of urinary bladder close to the dome. Stomach/Bowel: Small hiatal hernia is seen. Small bowel loops are not dilated. Appendix appears to be within right inguinal hernia. There is no dilation of the appendix. There is large amount of stool in the ascending, transverse and descending colon. The diffuse wall thickening in sigmoid colon in left side of pelvis. Scattered diverticula seen in colon. There is pericolic stranding adjacent to sigmoid colon. There is a 2.2 cm loculated fluid collection in the upper anterior margin of sigmoid colon in the left side of pelvis. There is stranding in the sigmoid mesentery. Rectum is unremarkable. Vascular/Lymphatic: Arterial calcifications are seen in aorta and its major branches. Reproductive: Prostate is enlarged. Other: There is no ascites or pneumoperitoneum. Umbilical hernia containing fat is seen. Bilateral inguinal hernias containing fat noted. Appendix is noted within the right inguinal hernia. Musculoskeletal: Degenerative changes are noted in lumbar spine, more severe at L4-L5 and L5-S1 levels with spinal stenosis and encroachment of neural foramina. IMPRESSION: Diverticulosis of colon. There is abnormal diffuse wall thickening in proximal sigmoid colon in left side of pelvis with adjacent inflammatory stranding. Findings suggest acute sigmoid diverticulitis. Endoscopy should be considered  to rule out any underlying neoplastic process in sigmoid. There is 2.2 cm loculated structure  with fluid and pockets of air between the sigmoid colon and the urinary bladder. This may suggest contained perforation. There is wall thickening in the left lateral wall of the urinary bladder. Possibility of extension of inflammation from the sigmoid colon to the urinary bladder wall should be considered. There are no pockets of air within the urinary bladder. There are no definite signs of colovesical fistula. There is no evidence of intestinal obstruction or pneumoperitoneum. There is no hydronephrosis. Appendix is noted within right inguinal hernia without signs of acute inflammation. Small hiatal hernia. Coronary artery disease. Large left renal cyst. Aortic arteriosclerosis. Lumbar spondylosis. Electronically Signed   By: Ernie Avena M.D.   On: 01/03/2023 09:48    Anti-infectives: Anti-infectives (From admission, onward)    Start     Dose/Rate Route Frequency Ordered Stop   01/04/23 1100  cefTRIAXone (ROCEPHIN) 2 g in sodium chloride 0.9 % 100 mL IVPB        2 g 200 mL/hr over 30 Minutes Intravenous Every 24 hours 01/03/23 1101     01/03/23 2200  metroNIDAZOLE (FLAGYL) IVPB 500 mg        500 mg 100 mL/hr over 60 Minutes Intravenous Every 12 hours 01/03/23 1101     01/03/23 1045  cefTRIAXone (ROCEPHIN) 2 g in sodium chloride 0.9 % 100 mL IVPB       Placed in "And" Linked Group   2 g 200 mL/hr over 30 Minutes Intravenous  Once 01/03/23 1036 01/03/23 1253   01/03/23 1045  metroNIDAZOLE (FLAGYL) IVPB 500 mg       Placed in "And" Linked Group   500 mg 100 mL/hr over 60 Minutes Intravenous  Once 01/03/23 1036 01/03/23 1520       Assessment/Plan: Sigmoid diverticulitis with blood per rectum and anemia.  Responded appropriately with blood transfusion.  No need for acute surgical intervention.  Appreciate GI input.   Would advance to soft diet and monitor H/H over next 24 hours.  Agree with CTA if hemoglobin drops.  LOS: 1 day    Cole Brown 01/04/2023

## 2023-01-04 NOTE — Care Management CC44 (Signed)
Condition Code 44 Documentation Completed  Patient Details  Name: KAMARON DESKINS MRN: [id number] Date of Birth: 06-24-1945   Condition Code 44 given:  Yes Patient signature on Condition Code 44 notice:  Yes Documentation of 2 MD's agreement:  Yes Code 44 added to claim:  Yes    Annice Needy, LCSW 01/04/2023, 1:38 PM

## 2023-01-04 NOTE — Progress Notes (Signed)
PROGRESS NOTE    Cole Brown  WUJ:[id number] DOB: 1946-01-26 DOA: 01/03/2023 PCP: Elfredia Nevins, MD   Brief Narrative:  Cole Brown is a 77 y.o. male with medical history significant for HTN, inflammatory arthritis, fibromyalgia with intermittent Raynaud phenomenon on PO Methotrexate, chronic painless bloody diarrhea (pursuing active outpatient workup), and APLA positive presents with progressive fatigue and confusion in the setting of chronic bloody diarrhea.  Cole Brown notes that he has had years of chronic intermittently bloody, painless diarrhea. He recently saw GI Dr. Tasia Catchings on 7/9 and had an impending outpatient colonoscopy scheduled for the end of July.   Vitals were wnl on arrival to the ED, however his baseline Hgb of 12 was found to be low at 7.3, so was given 2u pRBCs. Sodium was also mildly low at 128. Obtained CT A/P which was remarkable for: diffuse wall thickening in proximal sigmoid colon with stranding and thickening of the adjacent L lateral bladder wall, without evidence of a fistula; and also 2.2cm loculated structure with fluid and air between the sigmoid colon and bladder. Cole Brown was seen by GI and General Surgery, who suggested continuing IV antibiotics, followed by colonoscopy in 8 weeks as outpatient. On hospital day 1 he was initiated on full liquid diet. Today, 7/19, his Hgb is up from 7.3 to 10.2 s/p 2u, and Na improved to 132. Continues on Ceftriaxone and Flagyl. Endorses one bowel movement yesterday, denies bleeding, question minimizing. Exam generally benign. Will likely advance to regular diet today.    Assessment & Plan:   Principal Problem:   Acute diverticulitis Active Problems:   Essential hypertension   Hypercholesteremia   History of diverticulitis   History of iron deficiency anemia   History of sleep apnea   Hyponatremia  Assessment and Plan: Acute blood loss anemia Baseline Hgb 12, and had been trending down to 9-10 range by 7/9  appointment with GI Dr. Tasia Catchings. Presented with Hgb 7.3, and is now s/p 2u pRBCs with Hgb improved this AM to 10.2. Vitals remain stable and wnl, and without symptoms of light headedness, SOB, or DOE, so will therefore continue to monitor and transfuse if Hgb<7 or becomes symptomatic. Pt not recommended for surgery, but per GS, may advance to soft diet today. - Advance to soft diet - Will continue to monitor with repeat H/H this afternoon and again 7/20 AM - General Surgery and GI consulted and appreciate recs and management   Comlicated Diverticulitis with ?perforation Has been pursuing outpatient management of his chronic diarrhea, with planned colonoscopy for late July, and suspicions of IBD. 7/18 CT A/P showed diffuse wall thickening in the sigmoid colon with stranding, thickening of L lateral bladder wall (no vesicocolonic fistula) and a 2.2cm loculation between sigmoid and bladder. Pt denies symptoms (though appears he may minimize, as wife disagreed with some of his history) and does not have an acute abdomen on exam, though with mild tenderness over LLQ to moderate palpation today. Will continue appropriate antibiotic coverage, and appreciate GI and Surgery following. - Continue Ceftriaxone and Flagyl, initiated 7/18 - mIVF 65ml/hr NS - Prn Oxycodone 5mg  for moderate pain  - GI and General Surgery consulted and appreciate recs and management   Mild asymptomatic hyponatremia Likely secondary to decreased PO intake (wife reports this, pt denies) over the last few days. Presents with Na at 128, without other electrolyte abnormalities or acute kidney injury. On 7/19 am labs, Na improved to 132. - mIVF with 61ml/hr NS   HTN  BP well controlled on outpatient home 5mg  Amlodipine.  - Continue 5mg  Amlodipine   Fibromyalgia with intermittent [name] phenomenon On weekly 20mg  methotrexate. Will need to hold during acute infection. Cole Brown also takes aleve every morning, and 4x Tylenol 500mg  every  night at once. Discussed that we'd rather schedule his Tylenol out, and add prns on top of that, which he is agreeable to. Cole Brown endorses stiffness this AM, denied getting out of bed yesterday, encouraged ambulation today and getting into chair. - Hold methotrexate for now - Schedule 500mg  Tylenol QID - Prn Oxycodone 5mg  for moderate pain, as above  - Ambulate pt and Up in Chair      DVT prophylaxis: SCDs Code Status: Full Family Communication: Wife Disposition Plan: Home Status is: Inpatient Remains inpatient appropriate because: continuing IV antibiotics for complicated diverticulitis.    Consultants:  GI General Surgery  Procedures:  None  Antimicrobials:  Initiated Ceftriaxone and Flagyl 7/18   Subjective: Patient seen and evaluated today with no new acute complaints or concerns. No acute concerns or events noted overnight, notes he wants to go home, and that he's "trying to process the diagnosis of diverticulitis." When asked if he had a bowel movement since arriving yesterday, says "yea, probably." When asked if there was blood, says, "the bleeding hasn't been an issue for a while." When confronted with presenting Hgb 7.3 and necessity of 2u pRBCs, pt responds, "oh.. well I guess so." Question reliability of history vs minimizing. Alert and oriented x4.  Objective: Vitals:   01/03/23 2035 01/03/23 2100 01/03/23 2317 01/04/23 0404  BP: 127/79 121/75 128/77 133/73  Pulse: 70 70 65 70  Resp: 18 18 18 18   Temp: 98.2 F (36.8 C) 98.3 F (36.8 C) 98.3 F (36.8 C) 98 F (36.7 C)  TempSrc: Oral Oral Oral Oral  SpO2: 97% 94% 96% 98%  Weight:    83 kg  Height:        Intake/Output Summary (Last 24 hours) at 01/04/2023 0856 Last data filed at 01/03/2023 1930 Gross per 24 hour  Intake 1850.75 ml  Output --  Net 1850.75 ml   Filed Weights   01/03/23 0813 01/04/23 0404  Weight: 93 kg 83 kg    Examination:  General exam: Appears calm and comfortable   Respiratory system: Clear to auscultation. Respiratory effort normal. Cardiovascular system: S1 & S2 heard, RRR.  Gastrointestinal system: Abdomen is soft, mildly tender to moderate palpation over LLQ Central nervous system: Alert and awake Extremities: No edema Skin: No significant lesions noted Psychiatry: Responsive affect.    Data Reviewed: I have personally reviewed following labs and imaging studies  CBC: Recent Labs  Lab 01/03/23 0825 01/04/23 0418  WBC 5.5 5.5  NEUTROABS 4.1  --   HGB 7.3* 10.2*  HCT 23.4* 32.0*  MCV 89.0 87.7  PLT 313 310   Basic Metabolic Panel: Recent Labs  Lab 01/03/23 0825 01/04/23 0418  NA 128* 132*  K 3.7 3.7  CL 99 99  CO2 23 22  GLUCOSE 111* 90  BUN 17 13  CREATININE 0.86 0.79  CALCIUM 8.3* 8.4*  MG  --  2.0   GFR: Estimated Creatinine Clearance: 79.7 mL/min (by C-G formula based on SCr of 0.79 mg/dL). Liver Function Tests: Recent Labs  Lab 01/03/23 0825  AST 15  ALT 18  ALKPHOS 78  BILITOT 0.5  PROT 6.1*  ALBUMIN 2.5*   No results for input(s): "LIPASE", "AMYLASE" in the last 168 hours. No results for  input(s): "AMMONIA" in the last 168 hours. Coagulation Profile: No results for input(s): "INR", "PROTIME" in the last 168 hours. Cardiac Enzymes: No results for input(s): "CKTOTAL", "CKMB", "CKMBINDEX", "TROPONINI" in the last 168 hours. BNP (last 3 results) No results for input(s): "PROBNP" in the last 8760 hours. HbA1C: No results for input(s): "HGBA1C" in the last 72 hours. CBG: No results for input(s): "GLUCAP" in the last 168 hours. Lipid Profile: No results for input(s): "CHOL", "HDL", "LDLCALC", "TRIG", "CHOLHDL", "LDLDIRECT" in the last 72 hours. Thyroid Function Tests: Recent Labs    01/03/23 0825  TSH 0.605   Anemia Panel: Recent Labs    01/03/23 0825  VITAMINB12 1,213*  FOLATE 14.8  FERRITIN 67  TIBC 265  IRON 16*  RETICCTPCT 1.2   Sepsis Labs: No results for input(s): "PROCALCITON",  "LATICACIDVEN" in the last 168 hours.  No results found for this or any previous visit (from the past 240 hour(s)).       Radiology Studies: CT ABDOMEN PELVIS W CONTRAST  Result Date: 01/03/2023 CLINICAL DATA:  Abdominal pain, bloody diarrhea EXAM: CT ABDOMEN AND PELVIS WITH CONTRAST TECHNIQUE: Multidetector CT imaging of the abdomen and pelvis was performed using the standard protocol following bolus administration of intravenous contrast. RADIATION DOSE REDUCTION: This exam was performed according to the departmental dose-optimization program which includes automated exposure control, adjustment of the mA and/or kV according to patient size and/or use of iterative reconstruction technique. CONTRAST:  OMNIPAQUE IOHEXOL 300 MG/ML  SOLN COMPARISON:  01/29/2013 FINDINGS: Lower chest: There is no focal consolidation in the lower lung fields. Left hemidiaphragm is elevated. Coronary artery calcifications are seen. Hepatobiliary: Surgical clips are seen in gallbladder fossa. There is no dilation of bile ducts. Pancreas: No focal abnormalities are seen. Spleen: There are scattered calcified nodules, possibly granulomas. Adrenals/Urinary Tract: Adrenals are unremarkable. There is no hydronephrosis. There is 7.1 cm cyst in the upper pole of left kidney. There are no renal or ureteral stones. There is wall thickening in the left lateral wall of urinary bladder close to the dome. Stomach/Bowel: Small hiatal hernia is seen. Small bowel loops are not dilated. Appendix appears to be within right inguinal hernia. There is no dilation of the appendix. There is large amount of stool in the ascending, transverse and descending colon. The diffuse wall thickening in sigmoid colon in left side of pelvis. Scattered diverticula seen in colon. There is pericolic stranding adjacent to sigmoid colon. There is a 2.2 cm loculated fluid collection in the upper anterior margin of sigmoid colon in the left side of pelvis. There  is stranding in the sigmoid mesentery. Rectum is unremarkable. Vascular/Lymphatic: Arterial calcifications are seen in aorta and its major branches. Reproductive: Prostate is enlarged. Other: There is no ascites or pneumoperitoneum. Umbilical hernia containing fat is seen. Bilateral inguinal hernias containing fat noted. Appendix is noted within the right inguinal hernia. Musculoskeletal: Degenerative changes are noted in lumbar spine, more severe at L4-L5 and L5-S1 levels with spinal stenosis and encroachment of neural foramina. IMPRESSION: Diverticulosis of colon. There is abnormal diffuse wall thickening in proximal sigmoid colon in left side of pelvis with adjacent inflammatory stranding. Findings suggest acute sigmoid diverticulitis. Endoscopy should be considered to rule out any underlying neoplastic process in sigmoid. There is 2.2 cm loculated structure with fluid and pockets of air between the sigmoid colon and the urinary bladder. This may suggest contained perforation. There is wall thickening in the left lateral wall of the urinary bladder. Possibility of extension of  inflammation from the sigmoid colon to the urinary bladder wall should be considered. There are no pockets of air within the urinary bladder. There are no definite signs of colovesical fistula. There is no evidence of intestinal obstruction or pneumoperitoneum. There is no hydronephrosis. Appendix is noted within right inguinal hernia without signs of acute inflammation. Small hiatal hernia. Coronary artery disease. Large left renal cyst. Aortic arteriosclerosis. Lumbar spondylosis. Electronically Signed   By: Ernie Avena M.D.   On: 01/03/2023 09:48        Scheduled Meds:  acetaminophen  650 mg Oral Q6H   Or   acetaminophen  650 mg Rectal Q6H   fluticasone  2 spray Each Nare Daily   pantoprazole  40 mg Oral BID   Continuous Infusions:  sodium chloride 55 mL/hr at 01/03/23 1509   cefTRIAXone (ROCEPHIN)  IV      metronidazole 500 mg (01/03/23 2100)     LOS: 1 day    Time spent: 35 minutes  Signed,  Marcelline Mates, MS4 Working with Dr. Standley Dakins

## 2023-01-04 NOTE — Progress Notes (Signed)
   01/04/23 1100  TOC Brief Assessment  Insurance and Status Reviewed  Patient has primary care physician Yes  Home environment has been reviewed alone  Prior level of function: indepenent  Prior/Current Home Services No current home services  Social Determinants of Health Reivew SDOH reviewed no interventions necessary  Readmission risk has been reviewed Yes  Transition of care needs no transition of care needs at this time

## 2023-01-04 NOTE — Telephone Encounter (Signed)
Please make patient a hospital follow up in 3-4 weeks with Dr. Tasia Catchings, LSL, chelsea or any other APP.  Please arrange for a CBC in 1 week for the patient.   Dr. Tasia Catchings has already advised office and patient that his colonoscopy should be postponed at least 8 weeks from now.   Brooke Bonito, MSN, APRN, FNP-BC, AGACNP-BC [facility]

## 2023-01-04 NOTE — Care Management Obs Status (Signed)
MEDICARE OBSERVATION STATUS NOTIFICATION   Patient Details  Name: Cole Brown MRN: [id number] Date of Birth: 1946/06/12   Medicare Observation Status Notification Given:  Yes    Annice Needy, LCSW 01/04/2023, 1:37 PM

## 2023-01-04 NOTE — Hospital Course (Addendum)
Cole Brown is a 76 y.o. male with medical history significant for HTN, inflammatory arthritis, fibromyalgia with intermittent Raynaud phenomenon on PO Methotrexate, chronic painless bloody diarrhea (pursuing active outpatient workup), and APLA positive presents with progressive fatigue and confusion in the setting of chronic bloody diarrhea.  Cole Brown notes that he has had years of chronic intermittently bloody, painless diarrhea. He recently saw GI Dr. Tasia Catchings on 7/9 and had an impending outpatient colonoscopy scheduled for the end of July.   Vitals were wnl on arrival to the ED, however his baseline Hgb of 12 was found to be low at 7.3, so was given 2u pRBCs. Sodium was also mildly low at 128. Obtained CT A/P which was remarkable for: diffuse wall thickening in proximal sigmoid colon with stranding and thickening of the adjacent L lateral bladder wall, without evidence of a fistula; and also 2.2cm loculated structure with fluid and air between the sigmoid colon and bladder. Cole Brown was seen by GI and General Surgery, who suggested continuing IV antibiotics, followed by colonoscopy in 8 weeks as outpatient. On hospital day 1 he was initiated on full liquid diet. Today, 7/19, his Hgb is up from 7.3 to 10.2 s/p 2u, and Na improved to 132, while continuing on Ceftriaxone and Flagyl. Endorsed one bowel movement yesterday, denies bleeding. Exam generally benign. Pt tolerated soft diet very well today and very much would like to go home today, which was reasonable.

## 2023-01-07 ENCOUNTER — Emergency Department (HOSPITAL_COMMUNITY)
Admission: EM | Admit: 2023-01-07 | Discharge: 2023-01-07 | Disposition: A | Discharge status: Home / Self Care | Payer: Medicare HMO | Attending: Emergency Medicine | Admitting: Emergency Medicine

## 2023-01-07 ENCOUNTER — Encounter (HOSPITAL_COMMUNITY): Payer: Self-pay

## 2023-01-07 ENCOUNTER — Emergency Department (HOSPITAL_COMMUNITY): Payer: Medicare HMO

## 2023-01-07 ENCOUNTER — Telehealth: Payer: Self-pay | Admitting: *Deleted

## 2023-01-07 ENCOUNTER — Other Ambulatory Visit: Payer: Self-pay

## 2023-01-07 DIAGNOSIS — K5732 Diverticulitis of large intestine without perforation or abscess without bleeding: Secondary | ICD-10-CM | POA: Diagnosis not present

## 2023-01-07 DIAGNOSIS — K625 Hemorrhage of anus and rectum: Secondary | ICD-10-CM | POA: Diagnosis not present

## 2023-01-07 DIAGNOSIS — Z7982 Long term (current) use of aspirin: Secondary | ICD-10-CM | POA: Diagnosis not present

## 2023-01-07 DIAGNOSIS — K4020 Bilateral inguinal hernia, without obstruction or gangrene, not specified as recurrent: Secondary | ICD-10-CM | POA: Diagnosis not present

## 2023-01-07 DIAGNOSIS — D72829 Elevated white blood cell count, unspecified: Secondary | ICD-10-CM | POA: Diagnosis not present

## 2023-01-07 DIAGNOSIS — K921 Melena: Secondary | ICD-10-CM

## 2023-01-07 DIAGNOSIS — N281 Cyst of kidney, acquired: Secondary | ICD-10-CM | POA: Diagnosis not present

## 2023-01-07 DIAGNOSIS — K5792 Diverticulitis of intestine, part unspecified, without perforation or abscess without bleeding: Secondary | ICD-10-CM

## 2023-01-07 DIAGNOSIS — K5730 Diverticulosis of large intestine without perforation or abscess without bleeding: Secondary | ICD-10-CM | POA: Diagnosis not present

## 2023-01-07 DIAGNOSIS — R935 Abnormal findings on diagnostic imaging of other abdominal regions, including retroperitoneum: Secondary | ICD-10-CM | POA: Diagnosis not present

## 2023-01-07 LAB — CBC WITH DIFFERENTIAL/PLATELET
Abs Immature Granulocytes: 0.07 10*3/uL (ref 0.00–0.07)
Basophils Absolute: 0.1 10*3/uL (ref 0.0–0.1)
Basophils Relative: 1 %
Eosinophils Absolute: 0.2 10*3/uL (ref 0.0–0.5)
Eosinophils Relative: 1 %
HCT: 28.9 % — ABNORMAL LOW (ref 39.0–52.0)
Hemoglobin: 9.3 g/dL — ABNORMAL LOW (ref 13.0–17.0)
Immature Granulocytes: 1 %
Lymphocytes Relative: 7 %
Lymphs Abs: 0.9 10*3/uL (ref 0.7–4.0)
MCH: 28.4 pg (ref 26.0–34.0)
MCHC: 32.2 g/dL (ref 30.0–36.0)
MCV: 88.1 fL (ref 80.0–100.0)
Monocytes Absolute: 0.7 10*3/uL (ref 0.1–1.0)
Monocytes Relative: 6 %
Neutro Abs: 10.8 10*3/uL — ABNORMAL HIGH (ref 1.7–7.7)
Neutrophils Relative %: 84 %
Platelets: 272 10*3/uL (ref 150–400)
RBC: 3.28 MIL/uL — ABNORMAL LOW (ref 4.22–5.81)
RDW: 16.1 % — ABNORMAL HIGH (ref 11.5–15.5)
WBC: 12.8 10*3/uL — ABNORMAL HIGH (ref 4.0–10.5)
nRBC: 0 % (ref 0.0–0.2)

## 2023-01-07 LAB — COMPREHENSIVE METABOLIC PANEL
ALT: 26 U/L (ref 0–44)
AST: 20 U/L (ref 15–41)
Albumin: 2.5 g/dL — ABNORMAL LOW (ref 3.5–5.0)
Alkaline Phosphatase: 90 U/L (ref 38–126)
Anion gap: 6 (ref 5–15)
BUN: 16 mg/dL (ref 8–23)
CO2: 24 mmol/L (ref 22–32)
Calcium: 7.9 mg/dL — ABNORMAL LOW (ref 8.9–10.3)
Chloride: 97 mmol/L — ABNORMAL LOW (ref 98–111)
Creatinine, Ser: 0.77 mg/dL (ref 0.61–1.24)
GFR, Estimated: >60 mL/min (ref >60)
Glucose, Bld: 97 mg/dL (ref 70–99)
Potassium: 3.8 mmol/L (ref 3.5–5.1)
Sodium: 127 mmol/L — ABNORMAL LOW (ref 135–145)
Total Bilirubin: 0.3 mg/dL (ref 0.3–1.2)
Total Protein: 6.4 g/dL — ABNORMAL LOW (ref 6.5–8.1)

## 2023-01-07 LAB — URINALYSIS, ROUTINE W REFLEX MICROSCOPIC
Bilirubin Urine: NEGATIVE
Glucose, UA: NEGATIVE mg/dL
Hgb urine dipstick: NEGATIVE
Ketones, ur: NEGATIVE mg/dL
Leukocytes,Ua: NEGATIVE
Nitrite: NEGATIVE
Protein, ur: NEGATIVE mg/dL
Specific Gravity, Urine: 1.016 (ref 1.005–1.030)
pH: 5 (ref 5.0–8.0)

## 2023-01-07 LAB — POC OCCULT BLOOD, ED: Fecal Occult Bld: POSITIVE — AB

## 2023-01-07 MED ORDER — IOHEXOL 300 MG/ML  SOLN
100.0000 mL | Freq: Once | INTRAMUSCULAR | Status: AC | PRN
  Administered 2023-01-07: 100 mL via INTRAVENOUS

## 2023-01-07 MED ORDER — SODIUM CHLORIDE 0.9 % IV BOLUS
1000.0000 mL | Freq: Once | INTRAVENOUS | Status: AC
  Administered 2023-01-07: 1000 mL via INTRAVENOUS

## 2023-01-07 NOTE — ED Notes (Signed)
Patient transported to CT 

## 2023-01-07 NOTE — ED Triage Notes (Signed)
Discharged Friday for diverticulitis  Noticed bright red blood in toilet after BM   No bleeding other than during BM

## 2023-01-07 NOTE — Discharge Instructions (Addendum)
The results of your CT showed: 1. Essentially stable exam. 2. Redemonstration of an approximately 12-14 cm long segment of proximal sigmoid colon exhibiting extensive irregular wall thickening and mild pericolonic fat stranding, compatible with patient's known history of diverticulitis. Considering the degree of thickening, colonoscopy and tissue sampling is recommended after the acute episode subsides to exclude underlying neoplastic process. Redemonstration of small contained perforation. No drainable abscess or collection seen.  Continue taking Ciprofloxacin and Metronidazole for diverticulitis as prescribed.  GI will call you for an appointment this week.  Return to ED if: you have 2+ bloody bowel movements a day, feel new or worsening weakness, fatigue, lightheadedness than usual.

## 2023-01-07 NOTE — ED Provider Notes (Signed)
[place] EMERGENCY DEPARTMENT AT [facility] Provider Note   CSN: [id number] Arrival date & time: 01/07/23  1118     History  Chief Complaint  Patient presents with   Rectal Bleeding    Cole Brown is a 77 y.o. male with a history of fibromyalgia, diverticulitis, and hematochezia who presents to the ED today after an episode of blood in the stool.  Patient reports this morning he had bright red blood with his bowel movement.  Blood did not cover the toilet bowl but was more than when he was admitted into the hospital.  He admits to stop stool but not diarrhea.  No bowel movements at this morning.  He has associated intermittent achy lower abdominal pain as well as some nausea.  No fever, vomiting, no fever, vomiting, worsening fatigue, or dizziness.  Patient was admitted to the hospital 4 days ago and was diagnosed with diverticulitis. He received 2 units PRBCs in the hospital and was started on Ciprofloxacin and Metronidazole twice a day to take for 13 days.  He is currently on this medication.  Supposed to follow-up with GI but has not made an appointment yet.    Home Medications Prior to Admission medications   Medication Sig Start Date End Date Taking? Authorizing Provider  acetaminophen (TYLENOL) 325 MG tablet Take 650 mg by mouth every 6 (six) hours as needed. daily    [provider]  albuterol (VENTOLIN HFA) 108 (90 Base) MCG/ACT inhaler Inhale 1 puff into the lungs as needed for wheezing or shortness of breath. 02/03/21   [provider]  amLODipine (NORVASC) 5 MG tablet Take 1 tablet (5 mg total) by mouth daily. 08/28/22   [name], NP  aspirin 81 MG EC tablet Take 1 tablet (81 mg total) by mouth daily. 07/25/21   [name], MD  atenolol (TENORMIN) 25 MG tablet TAKE (1/2) TABLET BY MOUTH ONCE DAILY. Patient taking differently: Take 12.5 mg by mouth daily. 12/03/22   [name], MD  azelastine (ASTELIN) 0.1 % nasal spray Place  1 spray into both nostrils 2 (two) times daily. 09/17/19   [provider]  Cholecalciferol (VITAMIN D) 50 MCG (2000 UT) CAPS Take 2,000 Units by mouth daily.    [provider]  ciprofloxacin (CIPRO) 500 MG tablet Take 1 tablet (500 mg total) by mouth 2 (two) times daily for 13 days. 01/05/23 01/18/23  [name], MD  diphenhydrAMINE (BENADRYL) 25 MG tablet Take 25 mg by mouth as needed for allergies.     [provider]  EPINEPHrine 0.3 mg/0.3 mL IJ SOAJ injection Inject 0.3 mg into the muscle as needed for anaphylaxis. 10/14/19   [provider]  Devoria Albe (VABYSMO IZ) by Intravitreal route. Monthly    [provider]  Ferrous Sulfate 142 (45 Fe) MG TBCR Take 1 tablet by mouth daily. 11/26/16   [provider]  FLUoxetine (PROZAC) 10 MG tablet Take 10 mg by mouth daily.    [provider]  fluticasone (FLONASE) 50 MCG/ACT nasal spray Place 1-2 sprays into both nostrils daily. 07/27/20   [provider]  folic acid (FOLVITE) 1 MG tablet TAKE 2 TABLETS BY MOUTH DAILY. Patient taking differently: Take 2 mg by mouth daily. 10/12/22   Gearldine Bienenstock, PA-C  hydrOXYzine (ATARAX/VISTARIL) 25 MG tablet Take 25 mg by mouth every 8 (eight) hours as needed for anxiety or itching. 04/15/20   [provider]  Investigational - Study Medication Study  name: atorvastatin 40mg  or placebo.    [provider]  levocetirizine (XYZAL) 5 MG tablet Take 5 mg by mouth every evening.     [provider]  methotrexate (RHEUMATREX) 2.5 MG tablet Take 8 tablets (20 mg total) by mouth once a week. 10/25/22   Gearldine Bienenstock, PA-C  metroNIDAZOLE (FLAGYL) 500 MG tablet Take 1 tablet (500 mg total) by mouth 2 (two) times daily with a meal for 13 days. DO NOT CONSUME ALCOHOL WHILE TAKING THIS MEDICATION. 01/04/23 01/17/23  [name], [name], MD  montelukast (SINGULAIR) 10 MG tablet Take 10 mg by mouth at bedtime.    [provider]  Multiple Vitamins-Minerals (ICAPS AREDS 2 PO) Take 1 capsule by mouth daily.    [provider]  naproxen sodium (ALEVE) 220 MG tablet Take 220 mg by mouth daily as needed (pain).    [provider]  Omega-3 Fatty Acids (FISH OIL) 1000 MG CAPS Take 1 capsule by mouth daily.    [provider]  Testosterone (ANDROGEL TD) Place onto the skin daily.    [provider]  triazolam (HALCION) 0.25 MG tablet Take 0.25 mg by mouth at bedtime.     [provider]      Allergies    Plaquenil [hydroxychloroquine sulfate] and Lodine [etodolac]    Review of Systems   Review of Systems  Gastrointestinal:  Positive for abdominal pain and blood in stool.       Bright red blood in stool during bowel movement this morning with dull aching abdominal pain  All other systems reviewed and are negative.   Physical Exam Updated Vital Signs BP 138/77   Pulse 68   Temp 97.7 F (36.5 C) (Oral)   Resp 16   Ht 5\' 11"  (1.803 m)   Wt 88.5 kg   SpO2 99%   BMI 27.20 kg/m  Physical Exam Vitals and nursing note reviewed.  Constitutional:      General: He is not in acute distress.    Appearance: Normal appearance.  HENT:     Head: Normocephalic and atraumatic.     Mouth/Throat:     Mouth: Mucous membranes are moist.  Eyes:     Conjunctiva/sclera: Conjunctivae normal.     Pupils: Pupils are equal, round, and reactive to light.  Cardiovascular:     Rate and Rhythm: Normal rate and regular rhythm.     Pulses: Normal pulses.  Pulmonary:     Effort: Pulmonary effort is normal.     Breath sounds: Normal breath sounds.  Abdominal:     General: There is no distension.     Palpations: Abdomen is soft.     Tenderness: There is abdominal tenderness. There is no guarding or rebound.  Skin:    General: Skin is warm and dry.     Findings: No rash.  Neurological:     General: No focal deficit present.     Mental Status: He is alert.  Psychiatric:         Mood and Affect: Mood normal.        Behavior: Behavior normal.     ED Results / Procedures / Treatments   Labs (all labs ordered are listed, but only abnormal results are displayed) Labs Reviewed  CBC WITH DIFFERENTIAL/PLATELET - Abnormal; Notable for the following components:      Result Value   WBC 12.8 (*)    RBC 3.28 (*)    Hemoglobin 9.3 (*)    HCT  28.9 (*)    RDW 16.1 (*)    Neutro Abs 10.8 (*)    All other components within normal limits  COMPREHENSIVE METABOLIC PANEL - Abnormal; Notable for the following components:   Sodium 127 (*)    Chloride 97 (*)    Calcium 7.9 (*)    Total Protein 6.4 (*)    Albumin 2.5 (*)    All other components within normal limits  POC OCCULT BLOOD, ED - Abnormal; Notable for the following components:   Fecal Occult Bld POSITIVE (*)    All other components within normal limits  URINALYSIS, ROUTINE W REFLEX MICROSCOPIC    EKG None  Radiology CT ABDOMEN PELVIS W CONTRAST  Result Date: 01/07/2023 CLINICAL DATA:  Diverticulitis, complication suspected EXAM: CT ABDOMEN AND PELVIS WITH CONTRAST TECHNIQUE: Multidetector CT imaging of the abdomen and pelvis was performed using the standard protocol following bolus administration of intravenous contrast. RADIATION DOSE REDUCTION: This exam was performed according to the departmental dose-optimization program which includes automated exposure control, adjustment of the mA and/or kV according to patient size and/or use of iterative reconstruction technique. CONTRAST:  OMNIPAQUE IOHEXOL 300 MG/ML  SOLN COMPARISON:  CT scan abdomen and pelvis from 01/03/2023. FINDINGS: Lower chest: There are subpleural atelectatic changes in the visualized lung bases. No overt consolidation. No pleural effusion. The heart is normal in size. No pericardial effusion. Hepatobiliary: The liver is normal in size. Non-cirrhotic configuration. No suspicious mass. No intrahepatic or extrahepatic bile duct dilation.  Gallbladder is surgically absent. Pancreas: Unremarkable. No pancreatic ductal dilatation or surrounding inflammatory changes. Spleen: Normal in size. There are sub 5 mm calcified granulomas in the spleen. No other focal lesion. Adrenals/Urinary Tract: Adrenal glands are unremarkable. No suspicious renal mass. There is a 6.7 x 6.8 cm partially exophytic cyst arising from the left kidney upper pole. No hydronephrosis. Bilateral extrarenal pelvis noted. No renal or ureteric calculi. Urinary bladder is under distended, precluding optimal assessment. However, no large mass or stones identified. There is mild asymmetric anterosuperior bladder wall thickening, likely secondary to diverticulitis. Stomach/Bowel: Redemonstration of an approximately 12-14 cm long segment of proximal sigmoid colon exhibiting extensive irregular wall thickening and mild pericolonic fat stranding, on the background of multiple diverticula, compatible with patient's known history of diverticulitis. Again, considering the degree of thickening, colonoscopy and tissue sampling is recommended after the acute episode subsides. Redemonstration of a rounded out-pouching along the anterior aspect (series 5, image 49), adjacent to the left anterosuperior bladder wall, which may represent contained perforation. Essentially no significant interval change since the prior study. No new abscess or collection seen. No pneumoperitoneum. No disproportionate dilation of small or large bowel loops to suggest bowel obstruction. Appendix is unremarkable and protrudes into the right inguinal hernia (Amyand's hernia). Vascular/Lymphatic: No ascites or pneumoperitoneum. No abdominal or pelvic lymphadenopathy, by size criteria. No aneurysmal dilation of the major abdominal arteries. There are moderate peripheral atherosclerotic vascular calcifications of the aorta and its major branches. Reproductive: Enlarged prostate. Symmetric seminal vesicles. Other: There is a small  right inguinal hernia containing fat, ascitic fluid and portion of appendix. There are also tiny fat containing umbilical and left inguinal hernias. Musculoskeletal: No suspicious osseous lesions. There are mild - moderate multilevel degenerative changes in the visualized spine. IMPRESSION: 1. Essentially stable exam. 2. Redemonstration of an approximately 12-14 cm long segment of proximal sigmoid colon exhibiting extensive irregular wall thickening and mild pericolonic fat stranding, compatible with patient's known history of diverticulitis. Again, considering the degree  of thickening, colonoscopy and tissue sampling is recommended after the acute episode subsides to exclude underlying neoplastic process. Redemonstration of small contained perforation. No drainable abscess or collection seen. 3. Multiple other nonacute observations, as described above. Aortic Atherosclerosis (ICD10-I70.0). Electronically Signed   By: Jules Schick M.D.   On: 01/07/2023 15:23    Procedures Procedures: not indicated.   Medications Ordered in ED Medications  sodium chloride 0.9 % bolus 1,000 mL (1,000 mLs Intravenous New Bag/Given 01/07/23 1439)  iohexol (OMNIPAQUE) 300 MG/ML solution 100 mL (100 mLs Intravenous Contrast Given 01/07/23 1418)    ED Course/ Medical Decision Making/ A&P                             Medical Decision Making Amount and/or Complexity of Data Reviewed Labs: ordered. Radiology: ordered.   This patient presents to the ED for concern of right red blood in stool, this involves an extensive number of treatment options, and is a complaint that carries with it a high risk of complications and morbidity.   Differential diagnosis includes: diverticulitis flare, hemorrhoids, GI bleed, etc.   Co morbidities that complicate the patient evaluation  Diverticulitis Hematochezia Fibromyalgia Hyponatremia   Additional history obtained:  Additional history obtained from patient's  records.   Lab Tests:  I ordered and personally interpreted labs.  The pertinent results include:   Positive fecal occult blood test Sodium of 127 otherwise within normal limits Elevated WBC of 12.8 with hemoglobin of 9.3, otherwise CBC is within normal limits UA is unremarkable   Imaging Studies ordered:  I ordered imaging studies including CT abdomen/pelvis with contrast I independently visualized and interpreted imaging which showed:   1. Essentially stable exam. 2. Redemonstration of an approximately 12-14 cm long segment of proximal sigmoid colon exhibiting extensive irregular wall thickening and mild pericolonic fat stranding, compatible with patient's known history of diverticulitis. Again, considering the degree of thickening, colonoscopy and tissue sampling is recommended after the acute episode subsides to exclude underlying neoplastic process. Redemonstration of small contained perforation. No drainable abscess or collection seen. I agree with the radiologist interpretation   Consultations Obtained:  I requested consultation with gastroenterology,  and discussed lab and imaging findings as well as pertinent plan - they recommend: outpatient follow up this week. The office will call patient to schedule appointment this week.   Problem List / ED Course / Critical interventions / Medication management  Bloody bowel movement x1 today with some aching lower abdominal pain 1L normal saline given for sodium of 127. I have reviewed the patients home medicines and have made adjustments as needed   Social Determinants of Health:  Access to healthcare   Test / Admission - Considered:  Discussed results with patient and wife at bedside. Patient is stable and safe for discharge home. Return precautions provided.       Final Clinical Impression(s) / ED Diagnoses Final diagnoses:  Hematochezia  Diverticulitis    Rx / DC Orders ED Discharge Orders     None          Maxwell Marion, PA-C 01/07/23 1648    Vanetta Mulders, MD 01/09/23 [phone]

## 2023-01-07 NOTE — Telephone Encounter (Signed)
Per Dr. Marletta Lor "Patient presented to ER, needs urgent fu in clinic this week with an APP anyone okay"  Pt has seen Dr. Tasia Catchings in office 12/25/22. He has openings tomorrow. Sending to Mitzie to call pt to schedule.

## 2023-01-07 NOTE — ED Provider Notes (Signed)
Patient seen by me along with the physician assistant.  I provided a substantive portion of the care of this patient.  I personally made/approved the management plan for this patient and take responsibility for the patient management.      Patient just discharged on the July 19 after being admitted July 18.  Patient was admitted for GI bleed and diverticulitis.  Patient initially treated with ceftriaxone and Flagyl.  Was switched over to Cipro and Flagyl that he supposed to be taking till August 1.  Patient also required 2 units of blood transfusion hemoglobin got down to 7.3.  There was evidence on his CT scan of the tooth 0.2 cm loculated structure with fluid and air between the sigmoid colon and bladder.  He was seen by GI and general surgery suggested continuing IV antibiotics followed by colonoscopy in 8 weeks as an outpatient.  Patient back here today because he had some blood with his bowel movement this morning I did not stain the toilet water all red.  He does have some increased abdominal discomfort.  Patient's white count is 12.8 up from when he was discharged.  Stool is Hemoccult positive.  Hemoglobin today is 9.3 down a little bit from where he was after blood transfusion.  He got up to 10.2.  Complete metabolic panel sodium 127 chloride 97 glucose good potassium normal GFR greater than 60 urinalysis negative.  Based on the elevated white count and the prior CT T finding of the 2.2 cm fluid collection we will go ahead and do repeat CT.  To make sure this is not gotten bigger.  Based on the 1 bowel movement today with some blood in it that does not seem to be in a large amount patient would not require admission for that.     Vanetta Mulders, MD 01/07/23 1409

## 2023-01-07 NOTE — Telephone Encounter (Signed)
I called and left a message on patient vm, asked that he please return call to the office as he will need a Cbc this Friday 01/11/2023. Ask patient where he wants to have drawn [facility] [employer] or [employer].

## 2023-01-08 ENCOUNTER — Encounter (INDEPENDENT_AMBULATORY_CARE_PROVIDER_SITE_OTHER): Payer: Self-pay | Admitting: Gastroenterology

## 2023-01-08 ENCOUNTER — Ambulatory Visit (INDEPENDENT_AMBULATORY_CARE_PROVIDER_SITE_OTHER): Payer: Medicare HMO | Admitting: Gastroenterology

## 2023-01-08 ENCOUNTER — Ambulatory Visit: Payer: Medicare HMO | Admitting: Internal Medicine

## 2023-01-08 ENCOUNTER — Encounter (INDEPENDENT_AMBULATORY_CARE_PROVIDER_SITE_OTHER): Payer: Self-pay

## 2023-01-08 VITALS — BP 112/71 | HR 76 | Temp 98.0°F | Ht 71.0 in | Wt 196.2 lb

## 2023-01-08 DIAGNOSIS — K631 Perforation of intestine (nontraumatic): Secondary | ICD-10-CM | POA: Diagnosis not present

## 2023-01-08 DIAGNOSIS — D62 Acute posthemorrhagic anemia: Secondary | ICD-10-CM | POA: Diagnosis not present

## 2023-01-08 DIAGNOSIS — K5792 Diverticulitis of intestine, part unspecified, without perforation or abscess without bleeding: Secondary | ICD-10-CM

## 2023-01-08 DIAGNOSIS — K921 Melena: Secondary | ICD-10-CM | POA: Diagnosis not present

## 2023-01-08 NOTE — Telephone Encounter (Signed)
FYI:  I spoke with the patient wife Annice Pih on [place], in regards to having a cbc done on this Friday 01/11/2023.She says the patient went to the ed yesterday and had labs drawn. The patient also has an appointment with Dr. Tasia Catchings today. Will forward this information to Dr. Tasia Catchings to make decision on whether he needs repeat cbc on Friday.

## 2023-01-08 NOTE — Progress Notes (Signed)
[name] , M.D. Gastroenterology & Hepatology [facility] Gastroenterology [address] [place], [place] [address] Primary Care Physician: Elfredia Nevins, MD [address] [place] [address]  Chief Complaint: Sigmoid diverticulitis with possible contained perforation with Painless hematochezia  History of Present Illness: Cole Brown is a 77 y.o. male with past medical history of inflammatory arthritis,  fibromyalgia with intermittent [name] phenomena on oral methotrexate , diverticular bleed (2018 managed conservatively), high cholesterol and HTN is here for follow up on Sigmoid diverticulitis with possible contained perforation with Painless hematochezia  Patient is well-known to our service.  He was seen by myself in the clinic on 12/25/2018 for for worsening anemia and rectal bleeding.  Patient was scheduled for colonoscopy but presented to the ER with blood per rectum.  He was admitted in the hospital with complicated diverticulitis and possible walled off perforation treated with IV antibiotics for 2 days.  He was discharged with 12 days of ciprofloxacin and Flagyl.  Patient again returned to the ER yesterday with small amount of blood per rectum with fairly stable hemoglobin.  Repeat CT Redemonstration of an approximately 12-14 cm long segment of proximal sigmoid colon exhibiting extensive irregular wall thickening and mild pericolonic fat stranding, and redemonstration of small contained perforation.  Today patient is seen in the clinic where he denies any abdominal pain fever chills and is only seeing blood upon wiping.  Patient is slowing medication and is forgetful which is his baseline.  Patient continues to take antibiotics   Last WGN:FAOZ Last Colonoscopy:august 2016 with multiple diverticula at sigmoid colon and external hemorrhoids    FHx: neg for any gastrointestinal/liver disease, no malignancies Social: neg smoking,  alcohol or illicit drug use  Past Medical History: Past Medical History:  Diagnosis Date   Diverticula of colon    2016 colonoscopy   Fibromyalgia    Fibromyalgia    History of GI diverticular bleed    Hypercholesteremia    Hypertension     Past Surgical History: Past Surgical History:  Procedure Laterality Date   allergy shots  weekly   CHOLECYSTECTOMY     COLONOSCOPY     Dr.Rehman   COLONOSCOPY N/A 01/27/2015   Rehman: multiple diverticula at sigmoid colon,ext hemorrhoids   EYE SURGERY Bilateral    partial cornea transplants    GALLBLADDER SURGERY  12/1993   UPPER GASTROINTESTINAL ENDOSCOPY      Family History: Family History  Problem Relation Age of Onset   Heart disease Mother    Pancreatic cancer Mother    Prostate cancer Father    Healthy Sister    Parkinson's disease Brother    Asthma Sister    Healthy Sister     Social History: Social History   Tobacco Use  Smoking Status Never   Passive exposure: Never  Smokeless Tobacco Never   Social History   Substance and Sexual Activity  Alcohol Use Yes   Alcohol/week: 0.0 standard drinks of alcohol   Comment: occas   Social History   Substance and Sexual Activity  Drug Use No    Allergies: Allergies  Allergen Reactions   Plaquenil [Hydroxychloroquine Sulfate]     rash   Lodine [Etodolac] Swelling and Rash    Medications: Current Outpatient Medications  Medication Sig Dispense Refill   acetaminophen (TYLENOL) 325 MG tablet Take 650 mg by mouth every 6 (six) hours as needed. daily     amLODipine (NORVASC) 5 MG tablet Take 1 tablet (5 mg  total) by mouth daily. 90 tablet 3   aspirin 81 MG EC tablet Take 1 tablet (81 mg total) by mouth daily.     atenolol (TENORMIN) 25 MG tablet TAKE (1/2) TABLET BY MOUTH ONCE DAILY. (Patient taking differently: Take 12.5 mg by mouth daily.) 45 tablet 2   azelastine (ASTELIN) 0.1 % nasal spray Place 1 spray into both nostrils 2 (two) times daily.      Cholecalciferol (VITAMIN D) 50 MCG (2000 UT) CAPS Take 2,000 Units by mouth daily.     ciprofloxacin (CIPRO) 500 MG tablet Take 1 tablet (500 mg total) by mouth 2 (two) times daily for 13 days. 26 tablet 0   diphenhydrAMINE (BENADRYL) 25 MG tablet Take 25 mg by mouth as needed for allergies.      EPINEPHrine 0.3 mg/0.3 mL IJ SOAJ injection Inject 0.3 mg into the muscle as needed for anaphylaxis.     famotidine (PEPCID) 20 MG tablet Take 20 mg by mouth. Takes as needed     Faricimab-svoa (VABYSMO IZ) by Intravitreal route. Monthly     FERROUS SULFATE ER PO Take 1 tablet by mouth daily. 325 mg daily     FLUoxetine (PROZAC) 10 MG tablet Take 10 mg by mouth daily.     fluticasone (FLONASE) 50 MCG/ACT nasal spray Place 1-2 sprays into both nostrils daily.     folic acid (FOLVITE) 1 MG tablet TAKE 2 TABLETS BY MOUTH DAILY. (Patient taking differently: Take 2 mg by mouth daily.) 180 tablet 3   hydrOXYzine (ATARAX/VISTARIL) 25 MG tablet Take 25 mg by mouth every 8 (eight) hours as needed for anxiety or itching.     Investigational - Study Medication Study name: atorvastatin 40mg  or placebo.     levocetirizine (XYZAL) 5 MG tablet Take 5 mg by mouth every evening.      methotrexate (RHEUMATREX) 2.5 MG tablet Take 8 tablets (20 mg total) by mouth once a week. 96 tablet 0   metroNIDAZOLE (FLAGYL) 500 MG tablet Take 1 tablet (500 mg total) by mouth 2 (two) times daily with a meal for 13 days. DO NOT CONSUME ALCOHOL WHILE TAKING THIS MEDICATION. 26 tablet 0   montelukast (SINGULAIR) 10 MG tablet Take 10 mg by mouth at bedtime.     Multiple Vitamins-Minerals (ICAPS AREDS 2 PO) Take 1 capsule by mouth daily.     naproxen sodium (ALEVE) 220 MG tablet Take 220 mg by mouth daily as needed (pain).     Omega-3 Fatty Acids (FISH OIL) 1000 MG CAPS Take 1 capsule by mouth daily.     OVER THE COUNTER MEDICATION Prep h ointment and cream as needed     Testosterone (ANDROGEL TD) Place onto the skin daily.     triazolam  (HALCION) 0.25 MG tablet Take 0.25 mg by mouth at bedtime.      No current facility-administered medications for this visit.    Review of Systems: GENERAL: negative for malaise, night sweats HEENT: No changes in hearing or vision, no nose bleeds or other nasal problems. NECK: Negative for lumps, goiter, pain and significant neck swelling RESPIRATORY: Negative for cough, wheezing CARDIOVASCULAR: Negative for chest pain, leg swelling, palpitations, orthopnea GI: SEE HPI MUSCULOSKELETAL: Negative for joint pain or swelling, back pain, and muscle pain. SKIN: Negative for lesions, rash HEMATOLOGY Negative for prolonged bleeding, bruising easily, and swollen nodes. ENDOCRINE: Negative for cold or heat intolerance, polyuria, polydipsia and goiter. NEURO: negative for tremor, gait imbalance, syncope and seizures. The remainder of the review of systems  is noncontributory.   Physical Exam: BP 112/71 (BP Location: Left Arm, Patient Position: Sitting, Cuff Size: Normal)   Pulse 76   Temp 98 F (36.7 C) (Oral)   Ht 5\' 11"  (1.803 m)   Wt 196 lb 3.2 oz (89 kg)   BMI 27.36 kg/m  GENERAL: The patient is AO x3, in no acute distress. HEENT: Head is normocephalic and atraumatic. EOMI are intact. Mouth is well hydrated and without lesions. NECK: Supple. No masses LUNGS: Clear to auscultation. No presence of rhonchi/wheezing/rales. Adequate chest expansion HEART: RRR, normal s1 and s2. ABDOMEN: Soft, nontender, no guarding, no peritoneal signs, and nondistended. BS +. No masses. EXTREMITIES: Without any cyanosis, clubbing, rash, lesions or edema.  Imaging/Labs as above  I personally reviewed and interpreted the available labs, imaging and endoscopic files.  CT 7/22  IMPRESSION: 1. Essentially stable exam. 2. Redemonstration of an approximately 12-14 cm long segment of proximal sigmoid colon exhibiting extensive irregular wall thickening and mild pericolonic fat stranding, compatible  with patient's known history of diverticulitis. Again, considering the degree of thickening, colonoscopy and tissue sampling is recommended after the acute episode subsides to exclude underlying neoplastic process. Redemonstration of small contained perforation. No drainable abscess or collection seen. 3. Multiple other nonacute observations, as described above.   Impression and Plan:  HAZIEL MOLNER is a 77 y.o. male with past medical history of inflammatory arthritis,  fibromyalgia with intermittent [name] phenomena on oral methotrexate , diverticular bleed (2018 managed conservatively), high cholesterol and HTN is here for follow up on Sigmoid diverticulitis with possible contained perforation with Painless hematochezia  #Painless hematochezia  #Sigmoid diverticulitis with possible contained perforation   Patient had 2 CTs in the past week demonstrating long segment (12-14cm) of sigmoid diverticulitis with a small contained perforation.  He was admitted where he received 2 unit of blood transfusion.  Patient was treated conservatively with IV antibiotics and was discharged home on p.o. antibiotics.  Patient was seen by general surgery while inpatient who recommended no acute surgical intervention.   Patient throughout remain afebrile with benign abdominal exam even on deep palpation, also done in the clinic today.  He denies any abdominal pain fever or chills.  On repeat labs yesterday shows mild leukocytosis of 12.  20% of diverticulitis presented with rectal bleeding given this long segment involvement.  Although acute diverticulitis and perforation are a contraindication to colonoscopy.   Blood per rectum is likely due to diverticulitis but malignancy needs to be excluded.  Case was again discussed with Dr. Franky Macho today given CT finding of small sigmoid perforation, and following plan was made in conjugation  Plan :  Continue p.o. ciprofloxacin and metronidazole  Repeat  CT abdomen with rectal contrast to assess perforation or any other lesion in 4 weeks   Return to clinic in 6-week for exam at that time we will decide on diagnostic colonoscopy versus surgery  Avoid NSAIDs  Given patient is forgetful requested wife to evaluate patient's stool for any large amount of blood.  Again advised the patient and the wife if any fever chill abdominal pain nausea vomiting a large amount of blood is encountered come to the ER  All questions were answered.      [name], MD Gastroenterology and Hepatology [facility] Gastroenterology

## 2023-01-08 NOTE — Patient Instructions (Signed)
It was very nice to meet you today, as dicussed with will plan for the following :  1) CT Abdomen with Rectal contrast in 4 weeks  2) See me back in 6 weeks , where I will assess him to schedule for colonoscopy 3) Continue the antibiotics  If any fever, chills, increasing diarrhea/large amount blood per rectum and abdominal pain , come to the ED

## 2023-01-08 NOTE — Telephone Encounter (Signed)
Thanks

## 2023-01-14 ENCOUNTER — Encounter (HOSPITAL_COMMUNITY): Payer: Medicare HMO

## 2023-01-15 ENCOUNTER — Emergency Department (HOSPITAL_COMMUNITY): Payer: Medicare HMO

## 2023-01-15 ENCOUNTER — Emergency Department (HOSPITAL_COMMUNITY)
Admission: EM | Admit: 2023-01-15 | Discharge: 2023-01-15 | Disposition: A | Discharge status: Home / Self Care | Payer: Medicare HMO | Attending: Emergency Medicine | Admitting: Emergency Medicine

## 2023-01-15 ENCOUNTER — Other Ambulatory Visit: Payer: Self-pay

## 2023-01-15 ENCOUNTER — Encounter (HOSPITAL_COMMUNITY): Payer: Self-pay | Admitting: *Deleted

## 2023-01-15 DIAGNOSIS — I10 Essential (primary) hypertension: Secondary | ICD-10-CM | POA: Diagnosis not present

## 2023-01-15 DIAGNOSIS — Z7982 Long term (current) use of aspirin: Secondary | ICD-10-CM | POA: Diagnosis not present

## 2023-01-15 DIAGNOSIS — I6782 Cerebral ischemia: Secondary | ICD-10-CM | POA: Diagnosis not present

## 2023-01-15 DIAGNOSIS — Z79899 Other long term (current) drug therapy: Secondary | ICD-10-CM | POA: Diagnosis not present

## 2023-01-15 DIAGNOSIS — R42 Dizziness and giddiness: Secondary | ICD-10-CM

## 2023-01-15 DIAGNOSIS — R410 Disorientation, unspecified: Secondary | ICD-10-CM | POA: Diagnosis not present

## 2023-01-15 DIAGNOSIS — E871 Hypo-osmolality and hyponatremia: Secondary | ICD-10-CM | POA: Diagnosis not present

## 2023-01-15 DIAGNOSIS — E860 Dehydration: Secondary | ICD-10-CM | POA: Diagnosis not present

## 2023-01-15 DIAGNOSIS — M353 Polymyalgia rheumatica: Secondary | ICD-10-CM | POA: Diagnosis not present

## 2023-01-15 DIAGNOSIS — R4182 Altered mental status, unspecified: Secondary | ICD-10-CM | POA: Diagnosis not present

## 2023-01-15 DIAGNOSIS — K5792 Diverticulitis of intestine, part unspecified, without perforation or abscess without bleeding: Secondary | ICD-10-CM | POA: Diagnosis not present

## 2023-01-15 DIAGNOSIS — Z6827 Body mass index (BMI) 27.0-27.9, adult: Secondary | ICD-10-CM | POA: Diagnosis not present

## 2023-01-15 LAB — CBC WITH DIFFERENTIAL/PLATELET
Abs Immature Granulocytes: 0.1 10*3/uL — ABNORMAL HIGH (ref 0.00–0.07)
Basophils Absolute: 0 10*3/uL (ref 0.0–0.1)
Basophils Relative: 0 %
Eosinophils Absolute: 0 10*3/uL (ref 0.0–0.5)
Eosinophils Relative: 0 %
HCT: 29.6 % — ABNORMAL LOW (ref 39.0–52.0)
Hemoglobin: 9.4 g/dL — ABNORMAL LOW (ref 13.0–17.0)
Lymphocytes Relative: 17 %
Lymphs Abs: 0.7 10*3/uL (ref 0.7–4.0)
MCH: 27.5 pg (ref 26.0–34.0)
MCHC: 31.8 g/dL (ref 30.0–36.0)
MCV: 86.5 fL (ref 80.0–100.0)
Metamyelocytes Relative: 2 %
Monocytes Absolute: 0.8 10*3/uL (ref 0.1–1.0)
Monocytes Relative: 18 %
Neutro Abs: 2.7 10*3/uL (ref 1.7–7.7)
Neutrophils Relative %: 63 %
Platelets: 418 10*3/uL — ABNORMAL HIGH (ref 150–400)
RBC: 3.42 MIL/uL — ABNORMAL LOW (ref 4.22–5.81)
RDW: 16.2 % — ABNORMAL HIGH (ref 11.5–15.5)
WBC: 4.3 10*3/uL (ref 4.0–10.5)
nRBC: 0 % (ref 0.0–0.2)

## 2023-01-15 LAB — COMPREHENSIVE METABOLIC PANEL
ALT: 34 U/L (ref 0–44)
AST: 36 U/L (ref 15–41)
Albumin: 2.6 g/dL — ABNORMAL LOW (ref 3.5–5.0)
Alkaline Phosphatase: 92 U/L (ref 38–126)
Anion gap: 8 (ref 5–15)
BUN: 19 mg/dL (ref 8–23)
CO2: 23 mmol/L (ref 22–32)
Calcium: 7.8 mg/dL — ABNORMAL LOW (ref 8.9–10.3)
Chloride: 94 mmol/L — ABNORMAL LOW (ref 98–111)
Creatinine, Ser: 0.79 mg/dL (ref 0.61–1.24)
GFR, Estimated: >60 mL/min (ref >60)
Glucose, Bld: 106 mg/dL — ABNORMAL HIGH (ref 70–99)
Potassium: 3.3 mmol/L — ABNORMAL LOW (ref 3.5–5.1)
Sodium: 125 mmol/L — ABNORMAL LOW (ref 135–145)
Total Bilirubin: 0.5 mg/dL (ref 0.3–1.2)
Total Protein: 6.6 g/dL (ref 6.5–8.1)

## 2023-01-15 LAB — URINALYSIS, ROUTINE W REFLEX MICROSCOPIC
Bilirubin Urine: NEGATIVE
Glucose, UA: NEGATIVE mg/dL
Hgb urine dipstick: NEGATIVE
Ketones, ur: NEGATIVE mg/dL
Leukocytes,Ua: NEGATIVE
Nitrite: NEGATIVE
Protein, ur: NEGATIVE mg/dL
Specific Gravity, Urine: 1.012 (ref 1.005–1.030)
pH: 6 (ref 5.0–8.0)

## 2023-01-15 LAB — MAGNESIUM: Magnesium: 2 mg/dL (ref 1.7–2.4)

## 2023-01-15 LAB — ETHANOL: Alcohol, Ethyl (B): <10 mg/dL (ref <10)

## 2023-01-15 MED ORDER — SODIUM CHLORIDE 0.9 % IV BOLUS
500.0000 mL | Freq: Once | INTRAVENOUS | Status: AC
  Administered 2023-01-15: 500 mL via INTRAVENOUS

## 2023-01-15 NOTE — Discharge Instructions (Signed)
Today's evaluation has been generally reassuring.  There is no evidence for infection, stroke, but there is evidence for mild dehydration.  Please be sure to follow-up with both your primary care physician and your neurologist for appropriate ongoing evaluation of your symptoms.  Return here for concerning changes in your condition.

## 2023-01-15 NOTE — ED Triage Notes (Signed)
Pt with c/o dizziness ongoing for awhile, pt not able to state how long.  Had dizziness this morning, mild currently per pt. + body aches. Denies any sick contacts. Decrease in PO since Monday the 22nd per wife.  + emesis since Friday and Saturday.  + diarrhea.  Feet swelling.  Per wife pt's confusion is getting worse. Sent from PCP and concerned for dehydration.

## 2023-01-15 NOTE — ED Notes (Signed)
Pt able to ambulate without assistance.  

## 2023-01-15 NOTE — ED Provider Notes (Signed)
[place] EMERGENCY DEPARTMENT AT [facility] Provider Note   CSN: [id number] Arrival date & time: 01/15/23  1054     History  Chief Complaint  Patient presents with   Dizziness    Cole Brown is a 77 y.o. male.  HPI Patient presents for primary care with concern for confusion, possible dehydration. Patient is here with his wife who provides most of the history as the patient cannot provide any depth of detail for his presentation. Wife notes that over the past months the patient has had worsening confusion, withdrawn status, decrease in appetite.  No obvious sick contacts, travel, medication or diet changes. Patient was hospitalized recently for diverticulitis, wife notes that since that time he has not complained of abdominal pain, but has not been eating or drinking normally either. After seeing his physician today was sent here for evaluation.    Home Medications Prior to Admission medications   Medication Sig Start Date End Date Taking? Authorizing Provider  acetaminophen (TYLENOL) 325 MG tablet Take 650 mg by mouth every 6 (six) hours as needed. daily    [provider]  amLODipine (NORVASC) 5 MG tablet Take 1 tablet (5 mg total) by mouth daily. 08/28/22   [name], NP  aspirin 81 MG EC tablet Take 1 tablet (81 mg total) by mouth daily. 07/25/21   [name], MD  atenolol (TENORMIN) 25 MG tablet TAKE (1/2) TABLET BY MOUTH ONCE DAILY. Patient taking differently: Take 12.5 mg by mouth daily. 12/03/22   [name], MD  azelastine (ASTELIN) 0.1 % nasal spray Place 1 spray into both nostrils 2 (two) times daily. 09/17/19   [provider]  Cholecalciferol (VITAMIN D) 50 MCG (2000 UT) CAPS Take 2,000 Units by mouth daily.    [provider]  ciprofloxacin (CIPRO) 500 MG tablet Take 1 tablet (500 mg total) by mouth 2 (two) times daily for 13 days. 01/05/23 01/18/23  [name], MD  diphenhydrAMINE (BENADRYL) 25 MG  tablet Take 25 mg by mouth as needed for allergies.     [provider]  EPINEPHrine 0.3 mg/0.3 mL IJ SOAJ injection Inject 0.3 mg into the muscle as needed for anaphylaxis. 10/14/19   [provider]  famotidine (PEPCID) 20 MG tablet Take 20 mg by mouth. Takes as needed    [provider]  Devoria Albe (VABYSMO IZ) by Intravitreal route. Monthly    [provider]  FERROUS SULFATE ER PO Take 1 tablet by mouth daily. 325 mg daily 11/26/16   [provider]  FLUoxetine (PROZAC) 10 MG tablet Take 10 mg by mouth daily.    [provider]  fluticasone (FLONASE) 50 MCG/ACT nasal spray Place 1-2 sprays into both nostrils daily. 07/27/20   [provider]  folic acid (FOLVITE) 1 MG tablet TAKE 2 TABLETS BY MOUTH DAILY. Patient taking differently: Take 2 mg by mouth daily. 10/12/22   Gearldine Bienenstock, PA-C  hydrOXYzine (ATARAX/VISTARIL) 25 MG tablet Take 25 mg by mouth every 8 (eight) hours as needed for anxiety or itching. 04/15/20   [provider]  Investigational - Study Medication Study name: atorvastatin 40mg  or placebo.    [provider]  levocetirizine (XYZAL) 5 MG tablet Take 5 mg by mouth every evening.     [provider]  methotrexate (RHEUMATREX) 2.5 MG tablet Take 8 tablets (20 mg total) by mouth once a week. 10/25/22   Gearldine Bienenstock, PA-C  metroNIDAZOLE (FLAGYL) 500 MG tablet  Take 1 tablet (500 mg total) by mouth 2 (two) times daily with a meal for 13 days. DO NOT CONSUME ALCOHOL WHILE TAKING THIS MEDICATION. 01/04/23 01/17/23  [name], [name], MD  montelukast (SINGULAIR) 10 MG tablet Take 10 mg by mouth at bedtime.    [provider]  Multiple Vitamins-Minerals (ICAPS AREDS 2 PO) Take 1 capsule by mouth daily.    [provider]  naproxen sodium (ALEVE) 220 MG tablet Take 220 mg by mouth daily as needed (pain).    [provider]  Omega-3 Fatty Acids (FISH OIL) 1000 MG CAPS Take 1  capsule by mouth daily.    [provider]  OVER THE COUNTER MEDICATION Prep h ointment and cream as needed    [provider]  Testosterone (ANDROGEL TD) Place onto the skin daily.    [provider]  triazolam (HALCION) 0.25 MG tablet Take 0.25 mg by mouth at bedtime.     [provider]      Allergies    Plaquenil [hydroxychloroquine sulfate] and Lodine [etodolac]    Review of Systems   Review of Systems  All other systems reviewed and are negative.   Physical Exam Updated Vital Signs BP 134/70   Pulse 70   Temp 98 F (36.7 C) (Oral)   Resp 18   Ht 5\' 11"  (1.803 m)   Wt 83.9 kg   SpO2 98%   BMI 25.80 kg/m  Physical Exam Vitals and nursing note reviewed.  Constitutional:      General: He is not in acute distress.    Appearance: He is well-developed.  HENT:     Head: Normocephalic and atraumatic.  Eyes:     Conjunctiva/sclera: Conjunctivae normal.  Cardiovascular:     Rate and Rhythm: Normal rate and regular rhythm.  Pulmonary:     Effort: Pulmonary effort is normal. No respiratory distress.     Breath sounds: No stridor.  Abdominal:     General: There is no distension.     Tenderness: There is no abdominal tenderness. There is no guarding.  Skin:    General: Skin is warm and dry.  Neurological:     Mental Status: He is alert.     Comments: Patient is awake, alert, sitting upright moving extremity spontaneously to command, but clearly has confusion, is minimally participatory in the conversation.  Psychiatric:        Cognition and Memory: Cognition is impaired. Memory is impaired.     ED Results / Procedures / Treatments   Labs (all labs ordered are listed, but only abnormal results are displayed) Labs Reviewed  COMPREHENSIVE METABOLIC PANEL - Abnormal; Notable for the following components:      Result Value   Sodium 125 (*)    Potassium 3.3 (*)    Chloride 94 (*)    Glucose, Bld 106 (*)    Calcium 7.8 (*)    Albumin  2.6 (*)    All other components within normal limits  CBC WITH DIFFERENTIAL/PLATELET - Abnormal; Notable for the following components:   RBC 3.42 (*)    Hemoglobin 9.4 (*)    HCT 29.6 (*)    RDW 16.2 (*)    Platelets 418 (*)    Abs Immature Granulocytes 0.10 (*)    All other components within normal limits  ETHANOL  MAGNESIUM  URINALYSIS, ROUTINE W REFLEX MICROSCOPIC    EKG None  Radiology MR BRAIN WO CONTRAST  Result Date: 01/15/2023 CLINICAL DATA:  Mental status  change, unknown cause EXAM: MRI HEAD WITHOUT CONTRAST TECHNIQUE: Multiplanar, multiecho pulse sequences of the brain and surrounding structures were obtained without intravenous contrast. COMPARISON:  12/29/16 MRI brain FINDINGS: Brain: Negative for acute infarct. No hemorrhage. No hydrocephalus. No extra-axial fluid collection. Mass effect. No mass lesion. There is sequela of very mild chronic microvascular ischemic change. Redemonstrated mild cerebellar tonsillar ectopia, unchanged compared to 2018. Vascular: Normal flow voids. Skull and upper cervical spine: Mild degenerative changes in the upper cervical spine of the C4-C5 level Sinuses/Orbits: No middle ear or mastoid effusion. Paranasal sinuses are clear. Bilateral lens replacement. Other: None. IMPRESSION: No acute intracranial process. Electronically Signed   By: Lorenza Cambridge M.D.   On: 01/15/2023 12:58    Procedures Procedures    Medications Ordered in ED Medications  sodium chloride 0.9 % bolus 500 mL (0 mLs Intravenous Stopped 01/15/23 1514)    ED Course/ Medical Decision Making/ A&P                                 Medical Decision Making Elderly male in no distress awake, alert, sitting upright, speaking clearly.  Patient presents with concern of anorexia, dehydration, fatigue in the context of recent hospitalization for diverticulitis, but without ongoing vomiting or diarrhea though he did have some a few days ago.  Patient's longer history of confusion,  withdrawn status is suggestive of cognitive decline, patient has not yet followed up with neurologist or had formal testing in this regard. Patient's initial vital signs including cardiac 70 sinus normal Pulse ox 100% room air normal Are both reassuring.   Amount and/or Complexity of Data Reviewed Independent Historian: spouse External Data Reviewed: notes. Labs: ordered. Decision-making details documented in ED Course. Radiology: ordered and independent interpretation performed. Decision-making details documented in ED Course.  Risk Prescription drug management. Decision regarding hospitalization. Diagnosis or treatment significantly limited by social determinants of health.   Update: Patient remains in no distress, hemodynamically the same, unremarkable.  MRI reviewed, labs reviewed, unremarkable aside from mild persistent hyponatremia without changes from the past few weeks.  Some suspicion for the patient's lack of p.o. intake following diverticulitis contributing to this versus worsening cognitive decline such as dementia but this is not a formal diagnosis. Patient does not grossly encephalopathic, but more is withdrawn, more consistent with cognitive decline. No evidence of bacteremia, sepsis.  Patient's wife and I discussed findings thus far, including generally reassuring MRI, the patient will follow-up with neurology and primary care after receiving fluid resuscitation here.        Final Clinical Impression(s) / ED Diagnoses Final diagnoses:  Dizziness  Dehydration  Hyponatremia    Rx / DC Orders ED Discharge Orders     None         Gerhard Munch, MD 01/15/23 [phone]

## 2023-01-16 ENCOUNTER — Encounter (HOSPITAL_COMMUNITY): Payer: Self-pay

## 2023-01-16 ENCOUNTER — Ambulatory Visit (HOSPITAL_COMMUNITY): Admit: 2023-01-16 | Payer: Medicare HMO

## 2023-01-16 DIAGNOSIS — H353211 Exudative age-related macular degeneration, right eye, with active choroidal neovascularization: Secondary | ICD-10-CM | POA: Diagnosis not present

## 2023-01-16 SURGERY — ESOPHAGOGASTRODUODENOSCOPY (EGD) WITH PROPOFOL
Anesthesia: Monitor Anesthesia Care

## 2023-01-18 DIAGNOSIS — E663 Overweight: Secondary | ICD-10-CM | POA: Diagnosis not present

## 2023-01-18 DIAGNOSIS — Z6827 Body mass index (BMI) 27.0-27.9, adult: Secondary | ICD-10-CM | POA: Diagnosis not present

## 2023-01-18 DIAGNOSIS — I10 Essential (primary) hypertension: Secondary | ICD-10-CM | POA: Diagnosis not present

## 2023-01-18 DIAGNOSIS — E860 Dehydration: Secondary | ICD-10-CM | POA: Diagnosis not present

## 2023-01-19 ENCOUNTER — Ambulatory Visit
Admission: EM | Admit: 2023-01-19 | Discharge: 2023-01-19 | Disposition: A | Discharge status: Home / Self Care | Payer: Medicare HMO | Attending: Nurse Practitioner | Admitting: Nurse Practitioner

## 2023-01-19 DIAGNOSIS — K1379 Other lesions of oral mucosa: Secondary | ICD-10-CM

## 2023-01-19 DIAGNOSIS — L03114 Cellulitis of left upper limb: Secondary | ICD-10-CM | POA: Diagnosis not present

## 2023-01-19 MED ORDER — MUPIROCIN 2 % EX OINT: 1.0000 | TOPICAL_OINTMENT | Freq: Two times a day (BID) | CUTANEOUS | 0 refills | Status: DC

## 2023-01-19 MED ORDER — CHLORHEXIDINE GLUCONATE 0.12 % MT SOLN: OROMUCOSAL | 0 refills | Status: DC

## 2023-01-19 MED ORDER — CHLORHEXIDINE GLUCONATE 4 % EX SOLN: CUTANEOUS | 0 refills | Status: DC

## 2023-01-19 NOTE — Discharge Instructions (Signed)
Take medication as prescribed. May take over-the-counter Tylenol or ibuprofen as needed for pain, fever, general discomfort. Cool compresses to the left hand to help with pain or swelling. Cleanse the area twice daily and apply the antibiotic ointment.  When out in public, keep the area covered, when you are home, leave the area open to air. Monitor the area for worsening symptoms to include increasing redness, swelling, or if you develop fever, chills, or other concerns.  If so, please follow-up in this clinic or with your primary care physician for further evaluation.  For mouth pain: Take medication as prescribed. Recommend a bland diet to include foods with decrease sodium and spices. May use Listerine that is diluted with water to gargle and spit as needed for mouth pain or discomfort. If symptoms do not improve with this treatment, please follow-up with your primary care physician for further evaluation. Make sure you are drinking at least 5-6 8 ounce glasses of water daily to help stay hydrated.  Follow-up as needed.

## 2023-01-19 NOTE — ED Triage Notes (Addendum)
Pt c/o of sore on the left hand at the base of the middle finger appears red and irritated some swelling redness to the sight, x 2 week has gotten worse over time. Pt states he was walking and just bumped his hand against something.   Pt has c/o sore mouth x 1 week. It was addressed in the ED at last visit.

## 2023-01-19 NOTE — ED Provider Notes (Signed)
[facility]    CSN: [id number] Arrival date & time: 01/19/23  1246      History   Chief Complaint No chief complaint on file.   HPI Cole Brown is a 77 y.o. male.   The history is provided by the patient.   The patient presents with his spouse for complaints of swelling to the knuckle of his left hand.  Patient and spouse do not recall what may have triggered the symptoms.  Per the triage note, patient hit his hand against something.  Since that time, he has noticed the area to the left knuckle.  He states over the past several days, area has increased in size.  He denies fever, chills, foul-smelling drainage, chest pain, abdominal pain, nausea, vomiting, or diarrhea.  Patient's spouse also states patient has complained of pain inside of his mouth.  She states that he notices increased sensitivity when eating spicy or salty foods.  Patient spouse states that patient is currently on antibiotics for diverticulitis to include Cipro and Flagyl.  She states he was seen in the emergency department on 01/15/2023 and diagnosed with dehydration.  She reports patient has been eating better since he was seen in the ER, he is continuing to work towards his baseline.  Patient is currently not receiving chemotherapy or on any immunosuppressant medications.  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   Lower abdominal pain 07/06/2019   Autoimmune disease (HCC)  09/15/2018   [name] disease without gangrene 09/15/2018   Hypercholesteremia 08/21/2018   History of diverticulitis 08/21/2018   History of iron deficiency anemia 08/21/2018   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   Fibromyalgia 08/21/2018   History of multiple allergies 08/21/2018   History of sleep apnea 08/21/2018   Rectal bleed 11/14/2016   Essential hypertension 11/14/2016    Past Surgical History:  Procedure Laterality Date   allergy shots  weekly   CHOLECYSTECTOMY     COLONOSCOPY     Dr.Rehman   COLONOSCOPY N/A 01/27/2015   Rehman: multiple diverticula at sigmoid colon,ext hemorrhoids   EYE SURGERY Bilateral    partial cornea transplants    GALLBLADDER SURGERY  12/1993   UPPER GASTROINTESTINAL ENDOSCOPY         Home Medications    Prior to Admission medications   Medication Sig Start Date End Date Taking? Authorizing Provider  chlorhexidine (HIBICLENS) 4 % external liquid Cleanse the left hand twice daily until symptoms improve. 01/19/23  Yes -Warren, Sadie Haber, NP  chlorhexidine (PERIDEX) 0.12 % solution Gargle and spit 10 mL twice daily for the next 7 days. 01/19/23  Yes -Warren, Sadie Haber, NP  mupirocin ointment (BACTROBAN) 2 % Apply 1 Application topically 2 (two) times daily. 01/19/23  Yes -Warren, Sadie Haber, NP  acetaminophen (TYLENOL) 325 MG tablet Take 650 mg by mouth every 6 (six) hours as needed. daily    [provider]  amLODipine (NORVASC) 5 MG tablet Take 1 tablet (5  mg total) by mouth daily. 08/28/22   [name], NP  aspirin 81 MG EC tablet Take 1 tablet (81 mg total) by mouth daily. 07/25/21   [name], MD  atenolol (TENORMIN) 25 MG tablet TAKE (1/2) TABLET BY MOUTH ONCE DAILY. Patient taking differently: Take 12.5 mg by mouth daily. 12/03/22   [name], MD  azelastine (ASTELIN) 0.1 % nasal spray Place 1 spray into both nostrils 2 (two) times daily. 09/17/19   [provider]  Cholecalciferol (VITAMIN  D) 50 MCG (2000 UT) CAPS Take 2,000 Units by mouth daily.    [provider]  diphenhydrAMINE (BENADRYL) 25 MG tablet Take 25 mg by mouth as needed for allergies.     [provider]  EPINEPHrine 0.3 mg/0.3 mL IJ SOAJ injection Inject 0.3 mg into the muscle as needed for anaphylaxis. 10/14/19   [provider]  famotidine (PEPCID) 20 MG tablet Take 20 mg by mouth. Takes as needed    [provider]  Devoria Albe (VABYSMO IZ) by Intravitreal route. Monthly    [provider]  FERROUS SULFATE ER PO Take 1 tablet by mouth daily. 325 mg daily 11/26/16   [provider]  FLUoxetine (PROZAC) 10 MG tablet Take 10 mg by mouth daily.    [provider]  fluticasone (FLONASE) 50 MCG/ACT nasal spray Place 1-2 sprays into both nostrils daily. 07/27/20   [provider]  folic acid (FOLVITE) 1 MG tablet TAKE 2 TABLETS BY MOUTH DAILY. Patient taking differently: Take 2 mg by mouth daily. 10/12/22   Gearldine Bienenstock, PA-C  hydrOXYzine (ATARAX/VISTARIL) 25 MG tablet Take 25 mg by mouth every 8 (eight) hours as needed for anxiety or itching. 04/15/20   [provider]  Investigational - Study Medication Study name: atorvastatin 40mg  or placebo.    [provider]  levocetirizine (XYZAL) 5 MG tablet Take 5 mg by mouth every evening.     [provider]  methotrexate (RHEUMATREX) 2.5 MG tablet Take 8 tablets (20 mg total) by mouth once a week. 10/25/22   Gearldine Bienenstock, PA-C  montelukast (SINGULAIR) 10 MG tablet Take 10 mg by mouth at bedtime.    [provider]  Multiple Vitamins-Minerals (ICAPS AREDS 2 PO) Take 1 capsule by mouth daily.    [provider]  naproxen sodium (ALEVE) 220 MG tablet Take 220 mg by mouth daily as needed (pain).    [provider]  Omega-3 Fatty Acids (FISH OIL) 1000 MG CAPS Take 1 capsule by mouth daily.    [provider]  OVER THE COUNTER MEDICATION Prep h  ointment and cream as needed    [provider]  Testosterone (ANDROGEL TD) Place onto the skin daily.    [provider]  triazolam (HALCION) 0.25 MG tablet Take 0.25 mg by mouth at bedtime.     [provider]    Family History Family History  Problem Relation Age of Onset   Heart disease Mother    Pancreatic cancer Mother    Prostate cancer Father    Healthy Sister    Parkinson's disease Brother    Asthma Sister    Healthy Sister     Social History Social History   Tobacco Use   Smoking status: Never    Passive exposure: Never   Smokeless tobacco: Never  Vaping Use   Vaping status: Never Used  Substance Use Topics   Alcohol use: Yes    Alcohol/week: 0.0 standard  drinks of alcohol    Comment: occas   Drug use: No     Allergies   Plaquenil [hydroxychloroquine sulfate] and Lodine [etodolac]   Review of Systems Review of Systems Per HPI  Physical Exam Triage Vital Signs ED Triage Vitals [01/19/23 1339]  Encounter Vitals Group     BP 115/66     Systolic BP Percentile      Diastolic BP Percentile      Pulse      Resp 12     Temp 97.7 F (36.5 C)     Temp Source Oral     SpO2      Weight      Height      Head Circumference      Peak Flow      Pain Score 3     Pain Loc      Pain Education      Exclude from Growth Chart    No data found.  Updated Vital Signs BP 115/66 (BP Location: Right Arm)   Temp 97.7 F (36.5 C) (Oral)   Resp 12   Visual Acuity Right Eye Distance:   Left Eye Distance:   Bilateral Distance:    Right Eye Near:   Left Eye Near:    Bilateral Near:     Physical Exam Vitals and nursing note reviewed.  Constitutional:      General: He is not in acute distress.    Appearance: Normal appearance.  HENT:     Head: Normocephalic.     Mouth/Throat:     Lips: Pink.     Mouth: Mucous membranes are moist. No oral lesions or angioedema.     Tongue: No lesions. Tongue does not deviate from midline.      Pharynx: Oropharynx is clear. Uvula midline.  Eyes:     Extraocular Movements: Extraocular movements intact.     Pupils: Pupils are equal, round, and reactive to light.  Cardiovascular:     Rate and Rhythm: Normal rate and regular rhythm.     Pulses: Normal pulses.     Heart sounds: Normal heart sounds.  Pulmonary:     Effort: Pulmonary effort is normal. No respiratory distress.     Breath sounds: Normal breath sounds. No stridor. No wheezing, rhonchi or rales.  Abdominal:     General: Bowel sounds are normal.     Palpations: Abdomen is soft.  Musculoskeletal:     Cervical back: Normal range of motion.  Skin:    General: Skin is warm and dry.     Comments: Induration to third metacarpal phalangeal joint of the left hand.  Induration with erythematous base.  Area appears fluctuant, there is no oozing or drainage present.  Neurological:     General: No focal deficit present.     Mental Status: He is alert and oriented to person, place, and time.  Psychiatric:        Mood and Affect: Mood normal.        Behavior: Behavior normal.      UC Treatments / Results  Labs (all labs ordered are listed, but only abnormal results are displayed) Labs Reviewed - No data to display  EKG   Radiology No results found.  Procedures Procedures (including critical care time)  Medications Ordered in UC Medications - No data to display  Initial Impression / Assessment and Plan / UC Course  I have reviewed the triage vital signs and the nursing notes.  Pertinent labs & imaging  results that were available during my care of the patient were reviewed by me and considered in my medical decision making (see chart for details).  The patient is well-appearing, he is in no acute distress, vital signs are stable.  Will treat for cellulitis of the left third metacarpal phalangeal joint for cellulitis topically with mupirocin 2% ointment.  Hibiclens also prescribed to cleanse the area.  With regard  to his mouth soreness, Periodex 12% mouthwash was prescribed for patient to gargle and spit twice daily for the next 7 days.  Discussed with the patient's spouse that because patient is currently on Cipro and Flagyl, would like to avoid additional antibiotics at this time.  Patient's spouse is in agreement.  Supportive care recommendations were provided and discussed with the patient and his spouse to include over-the-counter analgesics for pain or discomfort, warm salt water gargles, and use of Listerine, diluting it with water to gargle and spit as needed.  With regard to his hand, cool compresses were also recommended, along with covering the area if he goes out in public and leaving the area open to air when he is home.  Patient's spouse was advised if symptoms or not improving over the next 7 to 10 days, recommend following up in this clinic or with his primary care physician for further evaluation.  Patient's spouse is in agreement with this plan of care and verbalizes understanding.  All questions were answered.  Patient stable for discharge.   Final Clinical Impressions(s) / UC Diagnoses   Final diagnoses:  Cellulitis of left hand excluding fingers and thumb  Mouth pain     Discharge Instructions      Take medication as prescribed. May take over-the-counter Tylenol or ibuprofen as needed for pain, fever, general discomfort. Cool compresses to the left hand to help with pain or swelling. Cleanse the area twice daily and apply the antibiotic ointment.  When out in public, keep the area covered, when you are home, leave the area open to air. Monitor the area for worsening symptoms to include increasing redness, swelling, or if you develop fever, chills, or other concerns.  If so, please follow-up in this clinic or with your primary care physician for further evaluation.  For mouth pain: Take medication as prescribed. Recommend a bland diet to include foods with decrease sodium and  spices. May use Listerine that is diluted with water to gargle and spit as needed for mouth pain or discomfort. If symptoms do not improve with this treatment, please follow-up with your primary care physician for further evaluation. Make sure you are drinking at least 5-6 8 ounce glasses of water daily to help stay hydrated.  Follow-up as needed.     ED Prescriptions     Medication Sig Dispense Auth. Provider   mupirocin ointment (BACTROBAN) 2 % Apply 1 Application topically 2 (two) times daily. 22 g -Warren, Sadie Haber, NP   chlorhexidine (PERIDEX) 0.12 % solution Gargle and spit 10 mL twice daily for the next 7 days. 120 mL -Warren, Sadie Haber, NP   chlorhexidine (HIBICLENS) 4 % external liquid Cleanse the left hand twice daily until symptoms improve. 118 mL -Warren, Sadie Haber, NP      PDMP not reviewed this encounter.   Abran Cantor, NP 01/19/23 1416

## 2023-01-30 ENCOUNTER — Other Ambulatory Visit: Payer: Self-pay

## 2023-01-30 ENCOUNTER — Emergency Department (HOSPITAL_COMMUNITY)
Admission: EM | Admit: 2023-01-30 | Discharge: 2023-01-31 | Disposition: A | Discharge status: Home / Self Care | Payer: Medicare HMO | Attending: Emergency Medicine | Admitting: Emergency Medicine

## 2023-01-30 ENCOUNTER — Emergency Department (HOSPITAL_COMMUNITY): Payer: Medicare HMO

## 2023-01-30 ENCOUNTER — Encounter (HOSPITAL_COMMUNITY): Payer: Self-pay

## 2023-01-30 DIAGNOSIS — E871 Hypo-osmolality and hyponatremia: Secondary | ICD-10-CM | POA: Insufficient documentation

## 2023-01-30 DIAGNOSIS — Z1152 Encounter for screening for COVID-19: Secondary | ICD-10-CM | POA: Insufficient documentation

## 2023-01-30 DIAGNOSIS — M797 Fibromyalgia: Secondary | ICD-10-CM | POA: Diagnosis not present

## 2023-01-30 DIAGNOSIS — Z7982 Long term (current) use of aspirin: Secondary | ICD-10-CM | POA: Insufficient documentation

## 2023-01-30 DIAGNOSIS — R079 Chest pain, unspecified: Secondary | ICD-10-CM | POA: Diagnosis not present

## 2023-01-30 DIAGNOSIS — R5383 Other fatigue: Secondary | ICD-10-CM | POA: Diagnosis present

## 2023-01-30 DIAGNOSIS — I700 Atherosclerosis of aorta: Secondary | ICD-10-CM | POA: Diagnosis not present

## 2023-01-30 DIAGNOSIS — R519 Headache, unspecified: Secondary | ICD-10-CM | POA: Diagnosis not present

## 2023-01-30 DIAGNOSIS — I6782 Cerebral ischemia: Secondary | ICD-10-CM | POA: Diagnosis not present

## 2023-01-30 DIAGNOSIS — R531 Weakness: Secondary | ICD-10-CM | POA: Diagnosis not present

## 2023-01-30 LAB — BASIC METABOLIC PANEL
Anion gap: 11 (ref 5–15)
BUN: 15 mg/dL (ref 8–23)
CO2: 17 mmol/L — ABNORMAL LOW (ref 22–32)
Calcium: 8.2 mg/dL — ABNORMAL LOW (ref 8.9–10.3)
Chloride: 96 mmol/L — ABNORMAL LOW (ref 98–111)
Creatinine, Ser: 0.89 mg/dL (ref 0.61–1.24)
GFR, Estimated: >60 mL/min (ref >60)
Glucose, Bld: 105 mg/dL — ABNORMAL HIGH (ref 70–99)
Potassium: 4.1 mmol/L (ref 3.5–5.1)
Sodium: 124 mmol/L — ABNORMAL LOW (ref 135–145)

## 2023-01-30 LAB — URINALYSIS, ROUTINE W REFLEX MICROSCOPIC
Bilirubin Urine: NEGATIVE
Glucose, UA: NEGATIVE mg/dL
Hgb urine dipstick: NEGATIVE
Ketones, ur: 5 mg/dL — AB
Leukocytes,Ua: NEGATIVE
Nitrite: NEGATIVE
Protein, ur: NEGATIVE mg/dL
Specific Gravity, Urine: 1.032 — ABNORMAL HIGH (ref 1.005–1.030)
pH: 5 (ref 5.0–8.0)

## 2023-01-30 LAB — CBC
HCT: 27.7 % — ABNORMAL LOW (ref 39.0–52.0)
Hemoglobin: 8.6 g/dL — ABNORMAL LOW (ref 13.0–17.0)
MCH: 26.5 pg (ref 26.0–34.0)
MCHC: 31 g/dL (ref 30.0–36.0)
MCV: 85.2 fL (ref 80.0–100.0)
Platelets: 443 10*3/uL — ABNORMAL HIGH (ref 150–400)
RBC: 3.25 MIL/uL — ABNORMAL LOW (ref 4.22–5.81)
RDW: 17.6 % — ABNORMAL HIGH (ref 11.5–15.5)
WBC: 8.9 10*3/uL (ref 4.0–10.5)
nRBC: 0 % (ref 0.0–0.2)

## 2023-01-30 MED ORDER — SODIUM CHLORIDE 0.9 % IV BOLUS
500.0000 mL | Freq: Once | INTRAVENOUS | Status: AC
  Administered 2023-01-30: 500 mL via INTRAVENOUS

## 2023-01-30 NOTE — ED Provider Notes (Signed)
[place] EMERGENCY DEPARTMENT AT [facility] Provider Note   CSN: [id number] Arrival date & time: 01/30/23  1907     History {Add pertinent medical, surgical, social history, OB history to HPI:1} Chief Complaint  Patient presents with   Weakness   Body Pain    Cole Brown is a 77 y.o. male.  Presents to the emergency department with multiple complaints.  He has not been doing well for at least a week.  He does have a history of fibromyalgia.  He seems to have had an increase of his generalized pain.  Wife is concerned because she thinks he is taking too much Tylenol for this pain.  Patient has had some increase of confusion over the last year or so.  She reports increased "brain fog" this week.  He has not been wanting to eat or drink.  He has had some nausea and has vomited several times.  No diarrhea.  He has fallen several times.       Home Medications Prior to Admission medications   Medication Sig Start Date End Date Taking? Authorizing Provider  acetaminophen (TYLENOL) 325 MG tablet Take 650 mg by mouth every 6 (six) hours as needed. daily    [provider]  amLODipine (NORVASC) 5 MG tablet Take 1 tablet (5 mg total) by mouth daily. 08/28/22   [name], NP  aspirin 81 MG EC tablet Take 1 tablet (81 mg total) by mouth daily. 07/25/21   [name], MD  atenolol (TENORMIN) 25 MG tablet TAKE (1/2) TABLET BY MOUTH ONCE DAILY. Patient taking differently: Take 12.5 mg by mouth daily. 12/03/22   [name], MD  azelastine (ASTELIN) 0.1 % nasal spray Place 1 spray into both nostrils 2 (two) times daily. 09/17/19   [provider]  chlorhexidine (HIBICLENS) 4 % external liquid Cleanse the left hand twice daily until symptoms improve. 01/19/23   Leath-Warren, Sadie Haber, NP  chlorhexidine (PERIDEX) 0.12 % solution Gargle and spit 10 mL twice daily for the next 7 days. 01/19/23   Leath-Warren, Sadie Haber, NP  Cholecalciferol (VITAMIN D) 50  MCG (2000 UT) CAPS Take 2,000 Units by mouth daily.    [provider]  diphenhydrAMINE (BENADRYL) 25 MG tablet Take 25 mg by mouth as needed for allergies.     [provider]  EPINEPHrine 0.3 mg/0.3 mL IJ SOAJ injection Inject 0.3 mg into the muscle as needed for anaphylaxis. 10/14/19   [provider]  famotidine (PEPCID) 20 MG tablet Take 20 mg by mouth. Takes as needed    [provider]  Devoria Albe (VABYSMO IZ) by Intravitreal route. Monthly    [provider]  FERROUS SULFATE ER PO Take 1 tablet by mouth daily. 325 mg daily 11/26/16   [provider]  FLUoxetine (PROZAC) 10 MG tablet Take 10 mg by mouth daily.    [provider]  fluticasone (FLONASE) 50 MCG/ACT nasal spray Place 1-2 sprays into both nostrils daily. 07/27/20   [provider]  folic acid (FOLVITE) 1 MG tablet TAKE 2 TABLETS BY MOUTH DAILY. Patient taking differently: Take 2 mg by mouth daily. 10/12/22   Gearldine Bienenstock, PA-C  hydrOXYzine (ATARAX/VISTARIL) 25 MG tablet Take 25 mg by mouth every 8 (eight) hours as needed for anxiety or itching. 04/15/20   [provider]  Investigational - Study Medication Study name: atorvastatin 40mg  or placebo.    [provider]  levocetirizine (XYZAL) 5 MG tablet Take 5  mg by mouth every evening.     [provider]  methotrexate (RHEUMATREX) 2.5 MG tablet Take 8 tablets (20 mg total) by mouth once a week. 10/25/22   Gearldine Bienenstock, PA-C  montelukast (SINGULAIR) 10 MG tablet Take 10 mg by mouth at bedtime.    [provider]  Multiple Vitamins-Minerals (ICAPS AREDS 2 PO) Take 1 capsule by mouth daily.    [provider]  mupirocin ointment (BACTROBAN) 2 % Apply 1 Application topically 2 (two) times daily. 01/19/23   Leath-Warren, Sadie Haber, NP  naproxen sodium (ALEVE) 220 MG tablet Take 220 mg by mouth daily as needed (pain).    [provider]  Omega-3 Fatty Acids  (FISH OIL) 1000 MG CAPS Take 1 capsule by mouth daily.    [provider]  OVER THE COUNTER MEDICATION Prep h ointment and cream as needed    [provider]  Testosterone (ANDROGEL TD) Place onto the skin daily.    [provider]  triazolam (HALCION) 0.25 MG tablet Take 0.25 mg by mouth at bedtime.     [provider]      Allergies    Plaquenil [hydroxychloroquine sulfate] and Lodine [etodolac]    Review of Systems   Review of Systems  Physical Exam Updated Vital Signs BP 120/71 (BP Location: Right Arm)   Pulse 77   Temp 98.2 F (36.8 C) (Oral)   Resp 16   SpO2 100%  Physical Exam Vitals and nursing note reviewed.  Constitutional:      General: He is not in acute distress.    Appearance: He is well-developed.  HENT:     Head: Normocephalic and atraumatic.     Mouth/Throat:     Mouth: Mucous membranes are moist.  Eyes:     General: Vision grossly intact. Gaze aligned appropriately.     Extraocular Movements: Extraocular movements intact.     Conjunctiva/sclera: Conjunctivae normal.  Cardiovascular:     Rate and Rhythm: Normal rate and regular rhythm.     Pulses: Normal pulses.     Heart sounds: Normal heart sounds, S1 normal and S2 normal. No murmur heard.    No friction rub. No gallop.  Pulmonary:     Effort: Pulmonary effort is normal. No respiratory distress.     Breath sounds: Normal breath sounds.  Abdominal:     Palpations: Abdomen is soft.     Tenderness: There is no abdominal tenderness. There is no guarding or rebound.     Hernia: No hernia is present.  Musculoskeletal:        General: No swelling.     Cervical back: Full passive range of motion without pain, normal range of motion and neck supple. No pain with movement, spinous process tenderness or muscular tenderness. Normal range of motion.     Right lower leg: No edema.     Left lower leg: No edema.  Skin:    General: Skin is warm and dry.     Capillary Refill:  Capillary refill takes less than 2 seconds.     Findings: No ecchymosis, erythema, lesion or wound.  Neurological:     Mental Status: He is alert and oriented to person, place, and time.     GCS: GCS eye subscore is 4. GCS verbal subscore is 5. GCS motor subscore is 6.     Cranial Nerves: Cranial nerves 2-12 are intact.     Sensory: Sensation is intact.     Motor: Motor function  is intact. No weakness or abnormal muscle tone.     Coordination: Coordination is intact.  Psychiatric:        Mood and Affect: Mood normal.        Speech: Speech normal.        Behavior: Behavior normal.     ED Results / Procedures / Treatments   Labs (all labs ordered are listed, but only abnormal results are displayed) Labs Reviewed  BASIC METABOLIC PANEL - Abnormal; Notable for the following components:      Result Value   Sodium 124 (*)    Chloride 96 (*)    CO2 17 (*)    Glucose, Bld 105 (*)    Calcium 8.2 (*)    All other components within normal limits  CBC - Abnormal; Notable for the following components:   RBC 3.25 (*)    Hemoglobin 8.6 (*)    HCT 27.7 (*)    RDW 17.6 (*)    Platelets 443 (*)    All other components within normal limits  URINALYSIS, ROUTINE W REFLEX MICROSCOPIC - Abnormal; Notable for the following components:   Specific Gravity, Urine 1.032 (*)    Ketones, ur 5 (*)    All other components within normal limits  CULTURE, BLOOD (SINGLE)  SARS CORONAVIRUS 2 BY RT PCR  HEPATIC FUNCTION PANEL  PROTIME-INR  CK  SEDIMENTATION RATE  LACTIC ACID, PLASMA  ACETAMINOPHEN LEVEL  TROPONIN I (HIGH SENSITIVITY)    EKG None  Radiology No results found.  Procedures Procedures  {Document cardiac monitor, telemetry assessment procedure when appropriate:1}  Medications Ordered in ED Medications  sodium chloride 0.9 % bolus 500 mL (has no administration in time range)    ED Course/ Medical Decision Making/ A&P   {   Click here for ABCD2, HEART and other  calculatorsREFRESH Note before signing :1}                              Medical Decision Making Amount and/or Complexity of Data Reviewed Labs: ordered. Radiology: ordered.   ***  {Document critical care time when appropriate:1} {Document review of labs and clinical decision tools ie heart score, Chads2Vasc2 etc:1}  {Document your independent review of radiology images, and any outside records:1} {Document your discussion with family members, caretakers, and with consultants:1} {Document social determinants of health affecting pt's care:1} {Document your decision making why or why not admission, treatments were needed:1} Final Clinical Impression(s) / ED Diagnoses Final diagnoses:  None    Rx / DC Orders ED Discharge Orders     None

## 2023-01-30 NOTE — ED Triage Notes (Signed)
Pt presents with fatigue, weakness and pain everywhere. Pt has hx of fibromyalgia. Wife states that it has gotten worse and that he has not been getting out of bed, not eating, not drinking. Hx of recent falls.

## 2023-01-31 DIAGNOSIS — R519 Headache, unspecified: Secondary | ICD-10-CM | POA: Diagnosis not present

## 2023-01-31 DIAGNOSIS — R531 Weakness: Secondary | ICD-10-CM | POA: Diagnosis not present

## 2023-01-31 DIAGNOSIS — R079 Chest pain, unspecified: Secondary | ICD-10-CM | POA: Diagnosis not present

## 2023-01-31 DIAGNOSIS — M797 Fibromyalgia: Secondary | ICD-10-CM | POA: Diagnosis not present

## 2023-01-31 DIAGNOSIS — I700 Atherosclerosis of aorta: Secondary | ICD-10-CM | POA: Diagnosis not present

## 2023-01-31 DIAGNOSIS — I6782 Cerebral ischemia: Secondary | ICD-10-CM | POA: Diagnosis not present

## 2023-01-31 LAB — HEPATIC FUNCTION PANEL
ALT: 25 U/L (ref 0–44)
AST: 22 U/L (ref 15–41)
Albumin: 2.6 g/dL — ABNORMAL LOW (ref 3.5–5.0)
Alkaline Phosphatase: 107 U/L (ref 38–126)
Bilirubin, Direct: 0.3 mg/dL — ABNORMAL HIGH (ref 0.0–0.2)
Indirect Bilirubin: 0.5 mg/dL (ref 0.3–0.9)
Total Bilirubin: 0.8 mg/dL (ref 0.3–1.2)
Total Protein: 6.3 g/dL — ABNORMAL LOW (ref 6.5–8.1)

## 2023-01-31 LAB — LACTIC ACID, PLASMA: Lactic Acid, Venous: 1.1 mmol/L (ref 0.5–1.9)

## 2023-01-31 LAB — ACETAMINOPHEN LEVEL
Acetaminophen (Tylenol), Serum: 21 ug/mL (ref 10–30)
Acetaminophen (Tylenol), Serum: 46 ug/mL — ABNORMAL HIGH (ref 10–30)

## 2023-01-31 LAB — SEDIMENTATION RATE: Sed Rate: 69 mm/h — ABNORMAL HIGH (ref 0–16)

## 2023-01-31 LAB — SARS CORONAVIRUS 2 BY RT PCR: SARS Coronavirus 2 by RT PCR: NEGATIVE

## 2023-01-31 LAB — TROPONIN I (HIGH SENSITIVITY): Troponin I (High Sensitivity): 8 ng/L (ref <18)

## 2023-01-31 LAB — PROTIME-INR
INR: 1.2 (ref 0.8–1.2)
Prothrombin Time: 15.2 seconds (ref 11.4–15.2)

## 2023-01-31 LAB — CK: Total CK: 92 U/L (ref 49–397)

## 2023-02-05 ENCOUNTER — Encounter (HOSPITAL_COMMUNITY): Payer: Self-pay | Admitting: Radiology

## 2023-02-05 ENCOUNTER — Ambulatory Visit (HOSPITAL_COMMUNITY)
Admission: RE | Admit: 2023-02-05 | Discharge: 2023-02-05 | Disposition: A | Discharge status: Home / Self Care | Payer: Medicare HMO | Source: Ambulatory Visit | Attending: Gastroenterology | Admitting: Gastroenterology

## 2023-02-05 ENCOUNTER — Telehealth (INDEPENDENT_AMBULATORY_CARE_PROVIDER_SITE_OTHER): Payer: Self-pay | Admitting: Gastroenterology

## 2023-02-05 DIAGNOSIS — K4090 Unilateral inguinal hernia, without obstruction or gangrene, not specified as recurrent: Secondary | ICD-10-CM | POA: Diagnosis not present

## 2023-02-05 DIAGNOSIS — K5730 Diverticulosis of large intestine without perforation or abscess without bleeding: Secondary | ICD-10-CM | POA: Diagnosis not present

## 2023-02-05 DIAGNOSIS — K5792 Diverticulitis of intestine, part unspecified, without perforation or abscess without bleeding: Secondary | ICD-10-CM | POA: Insufficient documentation

## 2023-02-05 DIAGNOSIS — N281 Cyst of kidney, acquired: Secondary | ICD-10-CM | POA: Diagnosis not present

## 2023-02-05 LAB — CULTURE, BLOOD (SINGLE)
Culture: NO GROWTH
Special Requests: ADEQUATE

## 2023-02-05 MED ORDER — IOHEXOL 12 MG/ML PO SOLN: 30.0000 mL | Freq: Once | ORAL | Status: DC

## 2023-02-05 MED ORDER — IOHEXOL 300 MG/ML  SOLN
100.0000 mL | Freq: Once | INTRAMUSCULAR | Status: AC | PRN
  Administered 2023-02-05: 100 mL via INTRAVENOUS

## 2023-02-05 NOTE — Telephone Encounter (Signed)
Pt wife called in and states that pt had CT completed today but did not drink anything. When I originally called to schedule, central scheduling advised that pt be there 2 hours prior to drink oral contrast. When pt arrived at [facility] they were told that they did not have to be there until 2:15 (30 minutes prior). Pt wife is wanting to make sure what was done today was correct. Please advise. Thank you

## 2023-02-06 NOTE — Telephone Encounter (Signed)
Hi Cole Brown  I see that the CT scan was done yesterday.  I wanted this CT scan to be done with rectal contrast, and did not specify in order in epic.  I am not sure if rectal contrast was given or not.  Regardless the CT scan is done and revealed provide his inflammation, I am awaiting for the result and will follow-up this

## 2023-02-06 NOTE — Telephone Encounter (Signed)
FYI-Contacted CT at [facility] and spoke to [place]. Micah states he did receive rectal contrast

## 2023-02-11 ENCOUNTER — Ambulatory Visit (INDEPENDENT_AMBULATORY_CARE_PROVIDER_SITE_OTHER): Payer: Medicare HMO | Admitting: Gastroenterology

## 2023-02-11 ENCOUNTER — Ambulatory Visit: Payer: Medicare HMO | Admitting: Internal Medicine

## 2023-02-12 ENCOUNTER — Ambulatory Visit: Payer: Medicare HMO

## 2023-02-12 ENCOUNTER — Encounter (HOSPITAL_COMMUNITY): Payer: Self-pay | Admitting: *Deleted

## 2023-02-12 ENCOUNTER — Emergency Department (HOSPITAL_COMMUNITY): Payer: Medicare HMO

## 2023-02-12 ENCOUNTER — Inpatient Hospital Stay (HOSPITAL_COMMUNITY)
Admission: EM | Admit: 2023-02-12 | Discharge: 2023-02-18 | DRG: 377 | Disposition: A | Discharge status: Home / Self Care | Payer: Medicare HMO | Attending: Internal Medicine | Admitting: Internal Medicine

## 2023-02-12 ENCOUNTER — Other Ambulatory Visit: Payer: Self-pay

## 2023-02-12 DIAGNOSIS — Z7982 Long term (current) use of aspirin: Secondary | ICD-10-CM

## 2023-02-12 DIAGNOSIS — E43 Unspecified severe protein-calorie malnutrition: Secondary | ICD-10-CM | POA: Diagnosis not present

## 2023-02-12 DIAGNOSIS — D62 Acute posthemorrhagic anemia: Secondary | ICD-10-CM

## 2023-02-12 DIAGNOSIS — Z79899 Other long term (current) drug therapy: Secondary | ICD-10-CM

## 2023-02-12 DIAGNOSIS — R933 Abnormal findings on diagnostic imaging of other parts of digestive tract: Secondary | ICD-10-CM

## 2023-02-12 DIAGNOSIS — M797 Fibromyalgia: Secondary | ICD-10-CM | POA: Diagnosis present

## 2023-02-12 DIAGNOSIS — Z8719 Personal history of other diseases of the digestive system: Secondary | ICD-10-CM

## 2023-02-12 DIAGNOSIS — B952 Enterococcus as the cause of diseases classified elsewhere: Secondary | ICD-10-CM | POA: Diagnosis not present

## 2023-02-12 DIAGNOSIS — G9341 Metabolic encephalopathy: Secondary | ICD-10-CM | POA: Diagnosis not present

## 2023-02-12 DIAGNOSIS — D509 Iron deficiency anemia, unspecified: Secondary | ICD-10-CM | POA: Diagnosis not present

## 2023-02-12 DIAGNOSIS — N390 Urinary tract infection, site not specified: Secondary | ICD-10-CM | POA: Diagnosis not present

## 2023-02-12 DIAGNOSIS — Z888 Allergy status to other drugs, medicaments and biological substances status: Secondary | ICD-10-CM

## 2023-02-12 DIAGNOSIS — K449 Diaphragmatic hernia without obstruction or gangrene: Secondary | ICD-10-CM | POA: Diagnosis present

## 2023-02-12 DIAGNOSIS — K264 Chronic or unspecified duodenal ulcer with hemorrhage: Secondary | ICD-10-CM | POA: Diagnosis present

## 2023-02-12 DIAGNOSIS — K254 Chronic or unspecified gastric ulcer with hemorrhage: Principal | ICD-10-CM | POA: Diagnosis present

## 2023-02-12 DIAGNOSIS — K2950 Unspecified chronic gastritis without bleeding: Secondary | ICD-10-CM | POA: Diagnosis not present

## 2023-02-12 DIAGNOSIS — E7800 Pure hypercholesterolemia, unspecified: Secondary | ICD-10-CM | POA: Diagnosis present

## 2023-02-12 DIAGNOSIS — K921 Melena: Secondary | ICD-10-CM | POA: Diagnosis not present

## 2023-02-12 DIAGNOSIS — Z6824 Body mass index (BMI) 24.0-24.9, adult: Secondary | ICD-10-CM | POA: Diagnosis not present

## 2023-02-12 DIAGNOSIS — K649 Unspecified hemorrhoids: Secondary | ICD-10-CM | POA: Diagnosis present

## 2023-02-12 DIAGNOSIS — K922 Gastrointestinal hemorrhage, unspecified: Secondary | ICD-10-CM

## 2023-02-12 DIAGNOSIS — K5792 Diverticulitis of intestine, part unspecified, without perforation or abscess without bleeding: Secondary | ICD-10-CM | POA: Diagnosis not present

## 2023-02-12 DIAGNOSIS — I10 Essential (primary) hypertension: Secondary | ICD-10-CM | POA: Diagnosis not present

## 2023-02-12 DIAGNOSIS — Z8249 Family history of ischemic heart disease and other diseases of the circulatory system: Secondary | ICD-10-CM | POA: Diagnosis not present

## 2023-02-12 DIAGNOSIS — Z91030 Bee allergy status: Secondary | ICD-10-CM | POA: Diagnosis not present

## 2023-02-12 DIAGNOSIS — I7300 Raynaud's syndrome without gangrene: Secondary | ICD-10-CM | POA: Diagnosis present

## 2023-02-12 DIAGNOSIS — K5733 Diverticulitis of large intestine without perforation or abscess with bleeding: Secondary | ICD-10-CM | POA: Diagnosis not present

## 2023-02-12 DIAGNOSIS — Z820 Family history of epilepsy and other diseases of the nervous system: Secondary | ICD-10-CM | POA: Diagnosis not present

## 2023-02-12 DIAGNOSIS — N321 Vesicointestinal fistula: Secondary | ICD-10-CM

## 2023-02-12 DIAGNOSIS — R42 Dizziness and giddiness: Secondary | ICD-10-CM

## 2023-02-12 DIAGNOSIS — E871 Hypo-osmolality and hyponatremia: Secondary | ICD-10-CM | POA: Diagnosis not present

## 2023-02-12 DIAGNOSIS — K269 Duodenal ulcer, unspecified as acute or chronic, without hemorrhage or perforation: Secondary | ICD-10-CM

## 2023-02-12 DIAGNOSIS — K259 Gastric ulcer, unspecified as acute or chronic, without hemorrhage or perforation: Secondary | ICD-10-CM | POA: Diagnosis not present

## 2023-02-12 DIAGNOSIS — D649 Anemia, unspecified: Secondary | ICD-10-CM

## 2023-02-12 DIAGNOSIS — Z825 Family history of asthma and other chronic lower respiratory diseases: Secondary | ICD-10-CM | POA: Diagnosis not present

## 2023-02-12 DIAGNOSIS — R4182 Altered mental status, unspecified: Secondary | ICD-10-CM | POA: Diagnosis not present

## 2023-02-12 LAB — URINALYSIS, ROUTINE W REFLEX MICROSCOPIC
Bilirubin Urine: NEGATIVE
Glucose, UA: NEGATIVE mg/dL
Ketones, ur: NEGATIVE mg/dL
Nitrite: NEGATIVE
Protein, ur: NEGATIVE mg/dL
RBC / HPF: >50 RBC/hpf (ref 0–5)
Specific Gravity, Urine: 1.011 (ref 1.005–1.030)
WBC, UA: >50 WBC/hpf (ref 0–5)
pH: 6 (ref 5.0–8.0)

## 2023-02-12 LAB — CBC WITH DIFFERENTIAL/PLATELET
Abs Immature Granulocytes: 0.03 10*3/uL (ref 0.00–0.07)
Basophils Absolute: 0.1 10*3/uL (ref 0.0–0.1)
Basophils Relative: 1 %
Eosinophils Absolute: 0.1 10*3/uL (ref 0.0–0.5)
Eosinophils Relative: 1 %
HCT: 25.2 % — ABNORMAL LOW (ref 39.0–52.0)
Hemoglobin: 7.8 g/dL — ABNORMAL LOW (ref 13.0–17.0)
Immature Granulocytes: 0 %
Lymphocytes Relative: 13 %
Lymphs Abs: 1 10*3/uL (ref 0.7–4.0)
MCH: 26.5 pg (ref 26.0–34.0)
MCHC: 31 g/dL (ref 30.0–36.0)
MCV: 85.7 fL (ref 80.0–100.0)
Monocytes Absolute: 0.4 10*3/uL (ref 0.1–1.0)
Monocytes Relative: 6 %
Neutro Abs: 6 10*3/uL (ref 1.7–7.7)
Neutrophils Relative %: 79 %
Platelets: 247 10*3/uL (ref 150–400)
RBC: 2.94 MIL/uL — ABNORMAL LOW (ref 4.22–5.81)
RDW: 19.3 % — ABNORMAL HIGH (ref 11.5–15.5)
WBC: 7.5 10*3/uL (ref 4.0–10.5)
nRBC: 0 % (ref 0.0–0.2)

## 2023-02-12 LAB — RETICULOCYTES
Immature Retic Fract: 22.3 % — ABNORMAL HIGH (ref 2.3–15.9)
RBC.: 2.96 MIL/uL — ABNORMAL LOW (ref 4.22–5.81)
Retic Count, Absolute: 72.2 10*3/uL (ref 19.0–186.0)
Retic Ct Pct: 2.4 % (ref 0.4–3.1)

## 2023-02-12 LAB — COMPREHENSIVE METABOLIC PANEL
ALT: 25 U/L (ref 0–44)
AST: 26 U/L (ref 15–41)
Albumin: 2.3 g/dL — ABNORMAL LOW (ref 3.5–5.0)
Alkaline Phosphatase: 153 U/L — ABNORMAL HIGH (ref 38–126)
Anion gap: 10 (ref 5–15)
BUN: 19 mg/dL (ref 8–23)
CO2: 20 mmol/L — ABNORMAL LOW (ref 22–32)
Calcium: 7.8 mg/dL — ABNORMAL LOW (ref 8.9–10.3)
Chloride: 97 mmol/L — ABNORMAL LOW (ref 98–111)
Creatinine, Ser: 0.9 mg/dL (ref 0.61–1.24)
GFR, Estimated: >60 mL/min (ref >60)
Glucose, Bld: 100 mg/dL — ABNORMAL HIGH (ref 70–99)
Potassium: 3.8 mmol/L (ref 3.5–5.1)
Sodium: 127 mmol/L — ABNORMAL LOW (ref 135–145)
Total Bilirubin: 0.6 mg/dL (ref 0.3–1.2)
Total Protein: 6 g/dL — ABNORMAL LOW (ref 6.5–8.1)

## 2023-02-12 LAB — FERRITIN: Ferritin: 109 ng/mL (ref 24–336)

## 2023-02-12 LAB — ACETAMINOPHEN LEVEL: Acetaminophen (Tylenol), Serum: <10 ug/mL — ABNORMAL LOW (ref 10–30)

## 2023-02-12 LAB — OSMOLALITY: Osmolality: 279 mOsm/kg (ref 275–295)

## 2023-02-12 LAB — IRON AND TIBC
Iron: 16 ug/dL — ABNORMAL LOW (ref 45–182)
Saturation Ratios: 8 % — ABNORMAL LOW (ref 17.9–39.5)
TIBC: 212 ug/dL — ABNORMAL LOW (ref 250–450)
UIBC: 196 ug/dL

## 2023-02-12 LAB — HIV ANTIBODY (ROUTINE TESTING W REFLEX): HIV Screen 4th Generation wRfx: NONREACTIVE

## 2023-02-12 LAB — POC OCCULT BLOOD, ED

## 2023-02-12 LAB — OSMOLALITY, URINE: Osmolality, Ur: 336 mOsm/kg (ref 300–900)

## 2023-02-12 LAB — TSH: TSH: 1.662 u[IU]/mL (ref 0.350–4.500)

## 2023-02-12 LAB — FOLATE: Folate: >40 ng/mL (ref >5.9)

## 2023-02-12 LAB — AMMONIA: Ammonia: <10 umol/L (ref 9–35)

## 2023-02-12 LAB — PREPARE RBC (CROSSMATCH)

## 2023-02-12 LAB — VITAMIN B12: Vitamin B-12: 1150 pg/mL — ABNORMAL HIGH (ref 180–914)

## 2023-02-12 MED ORDER — MONTELUKAST SODIUM 10 MG PO TABS
10.0000 mg | ORAL_TABLET | Freq: Every day | ORAL | Status: DC
  Administered 2023-02-12 – 2023-02-17 (×6): 10 mg via ORAL
  Filled 2023-02-12 (×7): qty 1

## 2023-02-12 MED ORDER — ONDANSETRON HCL 4 MG/2ML IJ SOLN
4.0000 mg | Freq: Four times a day (QID) | INTRAMUSCULAR | Status: DC | PRN
  Administered 2023-02-13 – 2023-02-15 (×2): 4 mg via INTRAVENOUS
  Filled 2023-02-12 (×3): qty 2

## 2023-02-12 MED ORDER — SODIUM CHLORIDE 0.9 % IV SOLN
1.0000 g | INTRAVENOUS | Status: DC
  Administered 2023-02-12 – 2023-02-14 (×3): 1 g via INTRAVENOUS
  Filled 2023-02-12 (×3): qty 10

## 2023-02-12 MED ORDER — ATENOLOL 25 MG PO TABS
12.5000 mg | ORAL_TABLET | Freq: Every day | ORAL | Status: DC
  Administered 2023-02-13 – 2023-02-18 (×6): 12.5 mg via ORAL
  Filled 2023-02-12 (×6): qty 1

## 2023-02-12 MED ORDER — OXYCODONE HCL 5 MG PO TABS: 5.0000 mg | ORAL_TABLET | ORAL | Status: DC | PRN

## 2023-02-12 MED ORDER — FLUOXETINE HCL 10 MG PO CAPS
10.0000 mg | ORAL_CAPSULE | Freq: Every day | ORAL | Status: DC
  Administered 2023-02-13 – 2023-02-18 (×6): 10 mg via ORAL
  Filled 2023-02-12 (×6): qty 1

## 2023-02-12 MED ORDER — AMLODIPINE BESYLATE 5 MG PO TABS
5.0000 mg | ORAL_TABLET | Freq: Every day | ORAL | Status: DC
  Administered 2023-02-12 – 2023-02-17 (×5): 5 mg via ORAL
  Filled 2023-02-12 (×6): qty 1

## 2023-02-12 MED ORDER — ACETAMINOPHEN 650 MG RE SUPP
650.0000 mg | Freq: Four times a day (QID) | RECTAL | Status: DC
  Filled 2023-02-12: qty 1

## 2023-02-12 MED ORDER — PANTOPRAZOLE INFUSION (NEW) - SIMPLE MED
8.0000 mg/h | INTRAVENOUS | Status: AC
  Administered 2023-02-12 – 2023-02-14 (×6): 8 mg/h via INTRAVENOUS
  Filled 2023-02-12 (×2): qty 80
  Filled 2023-02-12 (×3): qty 100
  Filled 2023-02-12: qty 80
  Filled 2023-02-12: qty 100
  Filled 2023-02-12: qty 80
  Filled 2023-02-12: qty 100

## 2023-02-12 MED ORDER — PANTOPRAZOLE 80MG IVPB - SIMPLE MED
80.0000 mg | Freq: Once | INTRAVENOUS | Status: AC
  Administered 2023-02-12: 80 mg via INTRAVENOUS
  Filled 2023-02-12: qty 100

## 2023-02-12 MED ORDER — ACETAMINOPHEN 500 MG PO TABS
1000.0000 mg | ORAL_TABLET | Freq: Four times a day (QID) | ORAL | Status: DC
  Administered 2023-02-12 – 2023-02-17 (×15): 1000 mg via ORAL
  Filled 2023-02-12 (×19): qty 2

## 2023-02-12 MED ORDER — ONDANSETRON HCL 4 MG PO TABS: 4.0000 mg | ORAL_TABLET | Freq: Four times a day (QID) | ORAL | Status: DC | PRN

## 2023-02-12 MED ORDER — FLUTICASONE PROPIONATE 50 MCG/ACT NA SUSP
1.0000 | Freq: Every day | NASAL | Status: DC
  Administered 2023-02-13 – 2023-02-15 (×3): 1 via NASAL
  Administered 2023-02-16: 2 via NASAL
  Administered 2023-02-17: 1 via NASAL
  Administered 2023-02-18: 2 via NASAL
  Filled 2023-02-12 (×2): qty 16

## 2023-02-12 MED ORDER — AZELASTINE HCL 0.1 % NA SOLN: 1.0000 | Freq: Two times a day (BID) | NASAL | Status: DC

## 2023-02-12 MED ORDER — BOOST / RESOURCE BREEZE PO LIQD CUSTOM
1.0000 | Freq: Three times a day (TID) | ORAL | Status: DC
  Administered 2023-02-12 – 2023-02-16 (×7): 1 via ORAL

## 2023-02-12 MED ORDER — LEVOCETIRIZINE DIHYDROCHLORIDE 5 MG PO TABS: 5.0000 mg | ORAL_TABLET | Freq: Every evening | ORAL | Status: DC

## 2023-02-12 MED ORDER — LORATADINE 10 MG PO TABS
10.0000 mg | ORAL_TABLET | Freq: Every day | ORAL | Status: DC
  Administered 2023-02-13 – 2023-02-18 (×6): 10 mg via ORAL
  Filled 2023-02-12 (×6): qty 1

## 2023-02-12 MED ORDER — SODIUM CHLORIDE 0.9% IV SOLUTION: Freq: Once | INTRAVENOUS | Status: AC

## 2023-02-12 NOTE — ED Notes (Signed)
Dr. Eloise Harman performed rectal exam for occult blood; specimen is positive for blood in stool.

## 2023-02-12 NOTE — H&P (Addendum)
History and Physical    Cole Brown ZOX:[id number] DOB: November 17, 1945 DOA: 02/12/2023  PCP: Elfredia Nevins, MD   Patient coming from: Home  Chief Complaint: Dizziness/lightheadedness  HPI: Cole Brown is a 77 y.o. male with medical history significant for hypertension, inflammatory arthritis, fibromyalgia with [name] phenomenon, and recent diverticulitis who presented to the ED with progressive fatigue and confusion as well as some dizziness and lightheadedness that acutely worsened approximately 3 days ago.  This appears to be worsened with standing.  He appears to deny any abdominal pain, dark stools, or bloody stools.  He has had some increased pain and has been taking high doses of Tylenol up to eight 500 mg tablets per day.  Wife also reports a change in his personality with some "brain fog."   ED Course: Vital signs stable and orthostatic vitals currently pending.  1 unit PRBC ordered and pending.  EDP discussed with GI who recommends hospitalization for endoscopy upper and lower.  Urinalysis concerning for UTI.  He has been started on Protonix infusion.  Review of Systems: Reviewed as noted above, otherwise negative.  Past Medical History:  Diagnosis Date   Diverticula of colon    2016 colonoscopy   Fibromyalgia    Fibromyalgia    History of GI diverticular bleed    Hypercholesteremia    Hypertension     Past Surgical History:  Procedure Laterality Date   allergy shots  weekly   CHOLECYSTECTOMY     COLONOSCOPY     Dr.Rehman   COLONOSCOPY N/A 01/27/2015   Rehman: multiple diverticula at sigmoid colon,ext hemorrhoids   EYE SURGERY Bilateral    partial cornea transplants    GALLBLADDER SURGERY  12/1993   UPPER GASTROINTESTINAL ENDOSCOPY       reports that he has never smoked. He has never been exposed to tobacco smoke. He has never used smokeless tobacco. He reports that he does not currently use alcohol. He reports that he does not use drugs.  Allergies   Allergen Reactions   Plaquenil [Hydroxychloroquine Sulfate]     rash   Lodine [Etodolac] Swelling and Rash    Family History  Problem Relation Age of Onset   Heart disease Mother    Pancreatic cancer Mother    Prostate cancer Father    Healthy Sister    Parkinson's disease Brother    Asthma Sister    Healthy Sister     Prior to Admission medications   Medication Sig Start Date End Date Taking? Authorizing Provider  acetaminophen (TYLENOL) 325 MG tablet Take 650 mg by mouth every 6 (six) hours as needed. daily    [provider]  amLODipine (NORVASC) 5 MG tablet Take 1 tablet (5 mg total) by mouth daily. 08/28/22   [name], NP  aspirin 81 MG EC tablet Take 1 tablet (81 mg total) by mouth daily. 07/25/21   [name], MD  atenolol (TENORMIN) 25 MG tablet TAKE (1/2) TABLET BY MOUTH ONCE DAILY. Patient taking differently: Take 12.5 mg by mouth daily. 12/03/22   [name], MD  azelastine (ASTELIN) 0.1 % nasal spray Place 1 spray into both nostrils 2 (two) times daily. 09/17/19   [provider]  chlorhexidine (HIBICLENS) 4 % external liquid Cleanse the left hand twice daily until symptoms improve. 01/19/23   Leath-Warren, Sadie Haber, NP  chlorhexidine (PERIDEX) 0.12 % solution Gargle and spit 10 mL twice daily for the next 7 days. 01/19/23   Leath-Warren, Sadie Haber, NP  Cholecalciferol (  VITAMIN D) 50 MCG (2000 UT) CAPS Take 2,000 Units by mouth daily.    [provider]  diphenhydrAMINE (BENADRYL) 25 MG tablet Take 25 mg by mouth as needed for allergies.     [provider]  EPINEPHrine 0.3 mg/0.3 mL IJ SOAJ injection Inject 0.3 mg into the muscle as needed for anaphylaxis. 10/14/19   [provider]  famotidine (PEPCID) 20 MG tablet Take 20 mg by mouth. Takes as needed    [provider]  Devoria Albe (VABYSMO IZ) by Intravitreal route. Monthly    [provider]  FERROUS SULFATE ER PO Take 1 tablet by mouth  daily. 325 mg daily 11/26/16   [provider]  FLUoxetine (PROZAC) 10 MG tablet Take 10 mg by mouth daily.    [provider]  fluticasone (FLONASE) 50 MCG/ACT nasal spray Place 1-2 sprays into both nostrils daily. 07/27/20   [provider]  folic acid (FOLVITE) 1 MG tablet TAKE 2 TABLETS BY MOUTH DAILY. Patient taking differently: Take 2 mg by mouth daily. 10/12/22   Gearldine Bienenstock, PA-C  hydrOXYzine (ATARAX/VISTARIL) 25 MG tablet Take 25 mg by mouth every 8 (eight) hours as needed for anxiety or itching. 04/15/20   [provider]  Investigational - Study Medication Study name: atorvastatin 40mg  or placebo.    [provider]  levocetirizine (XYZAL) 5 MG tablet Take 5 mg by mouth every evening.     [provider]  methotrexate (RHEUMATREX) 2.5 MG tablet Take 8 tablets (20 mg total) by mouth once a week. 10/25/22   Gearldine Bienenstock, PA-C  montelukast (SINGULAIR) 10 MG tablet Take 10 mg by mouth at bedtime.    [provider]  Multiple Vitamins-Minerals (ICAPS AREDS 2 PO) Take 1 capsule by mouth daily.    [provider]  mupirocin ointment (BACTROBAN) 2 % Apply 1 Application topically 2 (two) times daily. 01/19/23   Leath-Warren, Sadie Haber, NP  naproxen sodium (ALEVE) 220 MG tablet Take 220 mg by mouth daily as needed (pain).    [provider]  Omega-3 Fatty Acids (FISH OIL) 1000 MG CAPS Take 1 capsule by mouth daily.    [provider]  OVER THE COUNTER MEDICATION Prep h ointment and cream as needed    [provider]  Testosterone (ANDROGEL TD) Place onto the skin daily.    [provider]  triazolam (HALCION) 0.25 MG tablet Take 0.25 mg by mouth at bedtime.     [provider]    Physical Exam: Vitals:   02/12/23 1046 02/12/23 1048  BP:  122/72  Pulse:  73  Resp:  14  Temp:  (!) 97 F (36.1 C)  TempSrc:  Temporal  SpO2:  100%  Weight: 83.9 kg   Height: 5\' 11"  (1.803 m)      Constitutional: NAD, calm, comfortable Vitals:   02/12/23 1046 02/12/23 1048  BP:  122/72  Pulse:  73  Resp:  14  Temp:  (!) 97 F (36.1 C)  TempSrc:  Temporal  SpO2:  100%  Weight: 83.9 kg   Height: 5\' 11"  (1.803 m)    Eyes: lids and conjunctivae normal Neck: normal, supple Respiratory: clear to auscultation bilaterally. Normal respiratory effort. No accessory muscle use.  Cardiovascular: Regular rate and rhythm, no murmurs. Abdomen: no tenderness, no distention. Bowel sounds positive.  Musculoskeletal:  No edema. Skin: no rashes, lesions, ulcers.  Psychiatric: Flat affect  Labs on Admission: I have personally reviewed following labs and  imaging studies  CBC: Recent Labs  Lab 02/12/23 1129  WBC 7.5  NEUTROABS 6.0  HGB 7.8*  HCT 25.2*  MCV 85.7  PLT 247   Basic Metabolic Panel: Recent Labs  Lab 02/12/23 1129  NA 127*  K 3.8  CL 97*  CO2 20*  GLUCOSE 100*  BUN 19  CREATININE 0.90  CALCIUM 7.8*   GFR: Estimated Creatinine Clearance: 70.9 mL/min (by C-G formula based on SCr of 0.9 mg/dL). Liver Function Tests: Recent Labs  Lab 02/12/23 1129  AST 26  ALT 25  ALKPHOS 153*  BILITOT 0.6  PROT 6.0*  ALBUMIN 2.3*   No results for input(s): "LIPASE", "AMYLASE" in the last 168 hours. Recent Labs  Lab 02/12/23 1129  AMMONIA <10   Coagulation Profile: No results for input(s): "INR", "PROTIME" in the last 168 hours. Cardiac Enzymes: No results for input(s): "CKTOTAL", "CKMB", "CKMBINDEX", "TROPONINI" in the last 168 hours. BNP (last 3 results) No results for input(s): "PROBNP" in the last 8760 hours. HbA1C: No results for input(s): "HGBA1C" in the last 72 hours. CBG: No results for input(s): "GLUCAP" in the last 168 hours. Lipid Profile: No results for input(s): "CHOL", "HDL", "LDLCALC", "TRIG", "CHOLHDL", "LDLDIRECT" in the last 72 hours. Thyroid Function Tests: No results for input(s): "TSH", "T4TOTAL", "FREET4", "T3FREE", "THYROIDAB" in the  last 72 hours. Anemia Panel: No results for input(s): "VITAMINB12", "FOLATE", "FERRITIN", "TIBC", "IRON", "RETICCTPCT" in the last 72 hours. Urine analysis:    Component Value Date/Time   COLORURINE YELLOW 02/12/2023 1341   APPEARANCEUR CLOUDY (A) 02/12/2023 1341   APPEARANCEUR Clear 10/09/2021 1455   LABSPEC 1.011 02/12/2023 1341   PHURINE 6.0 02/12/2023 1341   GLUCOSEU NEGATIVE 02/12/2023 1341   HGBUR LARGE (A) 02/12/2023 1341   BILIRUBINUR NEGATIVE 02/12/2023 1341   BILIRUBINUR negative 02/13/2022 1323   BILIRUBINUR Negative 10/09/2021 1455   KETONESUR NEGATIVE 02/12/2023 1341   PROTEINUR NEGATIVE 02/12/2023 1341   UROBILINOGEN 0.2 02/13/2022 1323   NITRITE NEGATIVE 02/12/2023 1341   LEUKOCYTESUR LARGE (A) 02/12/2023 1341    Radiological Exams on Admission: CT Head Wo Contrast  Result Date: 02/12/2023 CLINICAL DATA:  Mental status change of unknown cause. Dizziness. Lightheaded. Symptoms began 3 days ago. EXAM: CT HEAD WITHOUT CONTRAST TECHNIQUE: Contiguous axial images were obtained from the base of the skull through the vertex without intravenous contrast. RADIATION DOSE REDUCTION: This exam was performed according to the departmental dose-optimization program which includes automated exposure control, adjustment of the mA and/or kV according to patient size and/or use of iterative reconstruction technique. COMPARISON:  01/30/2023 FINDINGS: Brain: Age related volume loss. 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 No evidence of old or acute supra tentorial infarction, mass lesion, hemorrhage, hydrocephalus or extra-axial collection. Vascular: No abnormal vascular finding. Skull: Negative Sinuses/Orbits: Clear/normal Other: None IMPRESSION: No acute CT finding. Age related volume loss. 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 Electronically Signed   By: Paulina Fusi M.D.   On: 02/12/2023 13:08    EKG: Independently reviewed. SR  62bpm.  Assessment/Plan Principal Problem:   Acute blood loss anemia Active Problems:   Essential hypertension   Fibromyalgia   [name] disease without gangrene   Diverticulitis   Hyponatremia    Acute blood loss anemia Plan for 1 unit PRBC transfusion in ED PPI infusion ordered in ED Avoid heparin agents and use SCDs Continue to monitor hemoglobin levels Appreciate GI consultation for inpatient endoscopy  Questionable acute versus chronic encephalopathy Perhaps metabolic in the setting of  UTI Obtain urine cultures Start Rocephin empirically Check TSH, folate, RPR, B12 level  Recent diverticulitis Follows with Dr. 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 Plan for likely colonoscopy during this admission per GI Likely will require surgical referral  Mild asymptomatic chronic hyponatremia Continue to monitor closely Check TSH as above Urine and serum osmolarities  Hypertension Continue home medications  Fibromyalgia with intermittent [name] phenomenon Hold methotrexate for now Continue Tylenol 500 mg 4 times daily and as needed oxycodone 5 mg for moderate pain  DVT prophylaxis: SCDs Code Status: Full Family Communication: Wife at bedside 8/27 Disposition Plan: Admit for evaluation of confusion and GI bleed Consults called:GI Admission status:Inpatient, Tele   Severity of Illness: The appropriate patient status for this patient is INPATIENT. Inpatient status is judged to be reasonable and necessary in order to provide the required intensity of service to ensure the patient's safety. The patient's presenting symptoms, physical exam findings, and initial radiographic and laboratory data in the context of their chronic comorbidities is felt to place them at high risk for further clinical deterioration. Furthermore, it is not anticipated that  the patient will be medically stable for discharge from the hospital within 2 midnights of admission.   * I certify that at the point of admission it is my clinical judgment that the patient will require inpatient hospital care spanning beyond 2 midnights from the point of admission due to high intensity of service, high risk for further deterioration and high frequency of surveillance required.*   Cole Brown D Deeanne Deininger DO [facility]  If 7PM-7AM, please contact night-coverage [email or web address]  02/12/2023, 3:17 PM

## 2023-02-12 NOTE — ED Notes (Signed)
Hospitalist in room at this time.  

## 2023-02-12 NOTE — ED Provider Notes (Signed)
[place] EMERGENCY DEPARTMENT AT [facility] Provider Note   CSN: [id number] Arrival date & time: 02/12/23  0932     History {Add pertinent medical, surgical, social history, OB history to HPI:1} Chief Complaint  Patient presents with   Dizziness    Cole Brown is a 77 y.o. male.  HPI     Home Medications Prior to Admission medications   Medication Sig Start Date End Date Taking? Authorizing Provider  acetaminophen (TYLENOL) 325 MG tablet Take 650 mg by mouth every 6 (six) hours as needed. daily    [provider]  amLODipine (NORVASC) 5 MG tablet Take 1 tablet (5 mg total) by mouth daily. 08/28/22   [name], NP  aspirin 81 MG EC tablet Take 1 tablet (81 mg total) by mouth daily. 07/25/21   [name], MD  atenolol (TENORMIN) 25 MG tablet TAKE (1/2) TABLET BY MOUTH ONCE DAILY. Patient taking differently: Take 12.5 mg by mouth daily. 12/03/22   [name], MD  azelastine (ASTELIN) 0.1 % nasal spray Place 1 spray into both nostrils 2 (two) times daily. 09/17/19   [provider]  chlorhexidine (HIBICLENS) 4 % external liquid Cleanse the left hand twice daily until symptoms improve. 01/19/23   Leath-Warren, Sadie Haber, NP  chlorhexidine (PERIDEX) 0.12 % solution Gargle and spit 10 mL twice daily for the next 7 days. 01/19/23   Leath-Warren, Sadie Haber, NP  Cholecalciferol (VITAMIN D) 50 MCG (2000 UT) CAPS Take 2,000 Units by mouth daily.    [provider]  diphenhydrAMINE (BENADRYL) 25 MG tablet Take 25 mg by mouth as needed for allergies.     [provider]  EPINEPHrine 0.3 mg/0.3 mL IJ SOAJ injection Inject 0.3 mg into the muscle as needed for anaphylaxis. 10/14/19   [provider]  famotidine (PEPCID) 20 MG tablet Take 20 mg by mouth. Takes as needed    [provider]  Devoria Albe (VABYSMO IZ) by Intravitreal route. Monthly    [provider]  FERROUS SULFATE ER PO Take 1 tablet by  mouth daily. 325 mg daily 11/26/16   [provider]  FLUoxetine (PROZAC) 10 MG tablet Take 10 mg by mouth daily.    [provider]  fluticasone (FLONASE) 50 MCG/ACT nasal spray Place 1-2 sprays into both nostrils daily. 07/27/20   [provider]  folic acid (FOLVITE) 1 MG tablet TAKE 2 TABLETS BY MOUTH DAILY. Patient taking differently: Take 2 mg by mouth daily. 10/12/22   Gearldine Bienenstock, PA-C  hydrOXYzine (ATARAX/VISTARIL) 25 MG tablet Take 25 mg by mouth every 8 (eight) hours as needed for anxiety or itching. 04/15/20   [provider]  Investigational - Study Medication Study name: atorvastatin 40mg  or placebo.    [provider]  levocetirizine (XYZAL) 5 MG tablet Take 5 mg by mouth every evening.     [provider]  methotrexate (RHEUMATREX) 2.5 MG tablet Take 8 tablets (20 mg total) by mouth once a week. 10/25/22   Gearldine Bienenstock, PA-C  montelukast (SINGULAIR) 10 MG tablet Take 10 mg by mouth at bedtime.    [provider]  Multiple Vitamins-Minerals (ICAPS AREDS 2 PO) Take 1 capsule by mouth daily.    [provider]  mupirocin ointment (BACTROBAN) 2 % Apply 1 Application topically 2 (two) times daily. 01/19/23   Leath-Warren, Sadie Haber, NP  naproxen sodium (ALEVE) 220 MG tablet Take 220 mg by mouth daily as needed (pain).  [provider]  Omega-3 Fatty Acids (FISH OIL) 1000 MG CAPS Take 1 capsule by mouth daily.    [provider]  OVER THE COUNTER MEDICATION Prep h ointment and cream as needed    [provider]  Testosterone (ANDROGEL TD) Place onto the skin daily.    [provider]  triazolam (HALCION) 0.25 MG tablet Take 0.25 mg by mouth at bedtime.     [provider]      Allergies    Plaquenil [hydroxychloroquine sulfate] and Lodine [etodolac]    Review of Systems   Review of Systems  Physical Exam Updated Vital Signs BP 122/72 (BP Location: Right Arm)    Pulse 73   Temp (!) 97 F (36.1 C) (Temporal)   Resp 14   Ht 5\' 11"  (1.803 m)   Wt 83.9 kg   SpO2 100%   BMI 25.80 kg/m  Physical Exam  ED Results / Procedures / Treatments   Labs (all labs ordered are listed, but only abnormal results are displayed) Labs Reviewed  CBC WITH DIFFERENTIAL/PLATELET - Abnormal; Notable for the following components:      Result Value   RBC 2.94 (*)    Hemoglobin 7.8 (*)    HCT 25.2 (*)    RDW 19.3 (*)    All other components within normal limits  COMPREHENSIVE METABOLIC PANEL - Abnormal; Notable for the following components:   Sodium 127 (*)    Chloride 97 (*)    CO2 20 (*)    Glucose, Bld 100 (*)    Calcium 7.8 (*)    Total Protein 6.0 (*)    Albumin 2.3 (*)    Alkaline Phosphatase 153 (*)    All other components within normal limits  ACETAMINOPHEN LEVEL - Abnormal; Notable for the following components:   Acetaminophen (Tylenol), Serum <10 (*)    All other components within normal limits  AMMONIA  URINALYSIS, ROUTINE W REFLEX MICROSCOPIC  CBG MONITORING, ED    EKG None  Radiology No results found.  Procedures Procedures  {Document cardiac monitor, telemetry assessment procedure when appropriate:1}  Medications Ordered in ED Medications - No data to display  ED Course/ Medical Decision Making/ A&P   {   Click here for ABCD2, HEART and other calculatorsREFRESH Note before signing :1}                              Medical Decision Making  ***  {Document critical care time when appropriate:1} {Document review of labs and clinical decision tools ie heart score, Chads2Vasc2 etc:1}  {Document your independent review of radiology images, and any outside records:1} {Document your discussion with family members, caretakers, and with consultants:1} {Document social determinants of health affecting pt's care:1} {Document your decision making why or why not admission, treatments were needed:1} Final Clinical Impression(s) / ED  Diagnoses Final diagnoses:  None    Rx / DC Orders ED Discharge Orders     None

## 2023-02-12 NOTE — ED Triage Notes (Signed)
Pt c/o dizziness and lightheadedness x months, worsening 3 days ago. Decreased oral intake. Wife reports he is taking "mega amounts" of Tylenol for pain. Pt reports he normally has pain and fatigue from his fibromyalgia, but it has been worsening. Wife also reports change in personality and "brain fog".

## 2023-02-12 NOTE — Consult Note (Signed)
@LOGO @   Referring Provider: Rolly Pancake, MD Primary Care Physician:  Elfredia Nevins, MD Primary Gastroenterologist:  Dr. Tasia Catchings  Date of Admission: 02/12/2023 Date of Consultation: 02/12/2023  Reason for Consultation: IDA  HPI:  Cole Brown is a 77 y.o. year old male with medical history significant for hypertension, inflammatory arthritis, fibromyalgia with [name] phenomenon, IDA, recent complicated diverticulitis with contained perforation in July 2024, most recent CT A/P with contrast 02/05/2023 with findings consistent with diverticulitis but would need to rule out underlying malignancy considering persistence of findings.  CT also noted colovesical fistula.  Patient presented to the emergency room with progressive fatigue and confusion, dizziness, lightheadedness that acutely worsened approximately 3 days ago.  ED course: Vital signs within normal limits. Hemoglobin 7.8 fecal occult blood has been ordered, but not completed. Sodium 127. UA concerning for UTI.  Consult:  Since March has had issues with poor po intake and memory issues.  Thinks he is lost about 5 to 10 pounds since March.  Denies nausea, vomiting, reflux symptoms, dysphagia.  Denies abdominal pain.  Bowels are moving normally.  He did have some diarrhea after having his CT last week, but no persistent symptoms.  No BRBPR.  Reports having some black stools on 8/25 and 8/26.  No additional antibiotics were prescribed for diverticulitis after his CT scan on 8/20.  Admits to taking 8-10 500 mg Tylenol daily as well as Aleve intermittently due to fibromyalgia.  He emptied a 24 tablet bottle of Aleve over the last week.  Wife reports he had a lot of trouble with brain fog and memory loss.  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  Last colonoscopy in August 2016 with multiple diverticula in the sigmoid colon, external hemorrhoids.  No prior EGD.  *Per  review office visit with Dr. Tasia Catchings 12/25/2022, patient have reported intermittent fresh blood upon wiping, but no blood dripping into the toilet bowl or mixed in the stool.   Wt Readings from Last 8 Encounters:  02/12/23 83.9 kg  01/15/23 83.9 kg  01/08/23 89 kg  01/07/23 88.5 kg  01/04/23 83 kg  12/25/22 86.7 kg  10/25/22 88.6 kg  08/28/22 89.1 kg     Past Medical History:  Diagnosis Date   Diverticula of colon    2016 colonoscopy   Fibromyalgia    Fibromyalgia    History of GI diverticular bleed    Hypercholesteremia    Hypertension     Past Surgical History:  Procedure Laterality Date   allergy shots  weekly   CHOLECYSTECTOMY     COLONOSCOPY     Dr.Rehman   COLONOSCOPY N/A 01/27/2015   Rehman: multiple diverticula at sigmoid colon,ext hemorrhoids   EYE SURGERY Bilateral    partial cornea transplants    GALLBLADDER SURGERY  12/1993   UPPER GASTROINTESTINAL ENDOSCOPY      Prior to Admission medications   Medication Sig Start Date End Date Taking? Authorizing Provider  acetaminophen (TYLENOL) 325 MG tablet Take 650 mg by mouth every 6 (six) hours as needed. daily    [provider]  amLODipine (NORVASC) 5 MG tablet Take 1 tablet (5 mg total) by mouth daily. 08/28/22   [name], NP  aspirin 81 MG EC tablet Take 1 tablet (81 mg total) by mouth daily. 07/25/21   [name], MD  atenolol (TENORMIN) 25 MG tablet TAKE (1/2) TABLET BY MOUTH ONCE DAILY. Patient taking differently: Take 12.5 mg  by mouth daily. 12/03/22   [name], MD  azelastine (ASTELIN) 0.1 % nasal spray Place 1 spray into both nostrils 2 (two) times daily. 09/17/19   [provider]  chlorhexidine (HIBICLENS) 4 % external liquid Cleanse the left hand twice daily until symptoms improve. 01/19/23   Leath-Warren, Sadie Haber, NP  chlorhexidine (PERIDEX) 0.12 % solution Gargle and spit 10 mL twice daily for the next 7 days. 01/19/23   Leath-Warren, Sadie Haber, NP  Cholecalciferol  (VITAMIN D) 50 MCG (2000 UT) CAPS Take 2,000 Units by mouth daily.    [provider]  diphenhydrAMINE (BENADRYL) 25 MG tablet Take 25 mg by mouth as needed for allergies.     [provider]  EPINEPHrine 0.3 mg/0.3 mL IJ SOAJ injection Inject 0.3 mg into the muscle as needed for anaphylaxis. 10/14/19   [provider]  famotidine (PEPCID) 20 MG tablet Take 20 mg by mouth. Takes as needed    [provider]  Devoria Albe (VABYSMO IZ) by Intravitreal route. Monthly    [provider]  FERROUS SULFATE ER PO Take 1 tablet by mouth daily. 325 mg daily 11/26/16   [provider]  FLUoxetine (PROZAC) 10 MG tablet Take 10 mg by mouth daily.    [provider]  fluticasone (FLONASE) 50 MCG/ACT nasal spray Place 1-2 sprays into both nostrils daily. 07/27/20   [provider]  folic acid (FOLVITE) 1 MG tablet TAKE 2 TABLETS BY MOUTH DAILY. Patient taking differently: Take 2 mg by mouth daily. 10/12/22   Gearldine Bienenstock, PA-C  hydrOXYzine (ATARAX/VISTARIL) 25 MG tablet Take 25 mg by mouth every 8 (eight) hours as needed for anxiety or itching. 04/15/20   [provider]  Investigational - Study Medication Study name: atorvastatin 40mg  or placebo.    [provider]  levocetirizine (XYZAL) 5 MG tablet Take 5 mg by mouth every evening.     [provider]  methotrexate (RHEUMATREX) 2.5 MG tablet Take 8 tablets (20 mg total) by mouth once a week. 10/25/22   Gearldine Bienenstock, PA-C  montelukast (SINGULAIR) 10 MG tablet Take 10 mg by mouth at bedtime.    [provider]  Multiple Vitamins-Minerals (ICAPS AREDS 2 PO) Take 1 capsule by mouth daily.    [provider]  mupirocin ointment (BACTROBAN) 2 % Apply 1 Application topically 2 (two) times daily. 01/19/23   Leath-Warren, Sadie Haber, NP  naproxen sodium (ALEVE) 220 MG tablet Take 220 mg by mouth daily as needed (pain).    [provider]  Omega-3  Fatty Acids (FISH OIL) 1000 MG CAPS Take 1 capsule by mouth daily.    [provider]  OVER THE COUNTER MEDICATION Prep h ointment and cream as needed    [provider]  Testosterone (ANDROGEL TD) Place onto the skin daily.    [provider]  triazolam (HALCION) 0.25 MG tablet Take 0.25 mg by mouth at bedtime.     [provider]    Current Facility-Administered Medications  Medication Dose Route Frequency Provider Last Rate Last Admin   0.9 %  sodium chloride infusion (Manually program via Guardrails IV Fluids)   Intravenous Once [name], [name], DO       acetaminophen (TYLENOL) tablet 1,000 mg  1,000 mg Oral Q6H [name], [name], DO       Or   acetaminophen (TYLENOL) suppository 650 mg  650 mg Rectal Q6H [name], [name], DO  amLODipine (NORVASC) tablet 5 mg  5 mg Oral Daily [name], [name], DO       atenolol (TENORMIN) tablet 12.5 mg  12.5 mg Oral Daily [name], [name], DO       azelastine (ASTELIN) 0.1 % nasal spray 1 spray  1 spray Each Nare BID [name], [name], DO       cefTRIAXone (ROCEPHIN) 1 g in sodium chloride 0.9 % 100 mL IVPB  1 g Intravenous Q24H [name], [name], DO       FLUoxetine (PROZAC) tablet 10 mg  10 mg Oral Daily [name], [name], DO       fluticasone (FLONASE) 50 MCG/ACT nasal spray 1-2 spray  1-2 spray Each Nare Daily [name], [name], DO       levocetirizine (XYZAL) tablet 5 mg  5 mg Oral QPM [name], [name], DO       montelukast (SINGULAIR) tablet 10 mg  10 mg Oral QHS [name], [name], DO       ondansetron (ZOFRAN) tablet 4 mg  4 mg Oral Q6H PRN [name], [name], DO       Or   ondansetron (ZOFRAN) injection 4 mg  4 mg Intravenous Q6H PRN [name], [name], DO       oxyCODONE (Oxy IR/ROXICODONE) immediate release tablet 5 mg  5 mg Oral Q4H PRN [name], [name], DO       pantoprazole (PROTONIX) 80 mg /NS 100 mL IVPB  80 mg Intravenous Once Maurilio Lovely D, DO 300 mL/hr at 02/12/23 1536 80 mg at 02/12/23 1536   pantoprozole (PROTONIX) 80 mg /NS 100  mL infusion  8 mg/hr Intravenous Continuous [name], [name], DO       Current Outpatient Medications  Medication Sig Dispense Refill   acetaminophen (TYLENOL) 325 MG tablet Take 650 mg by mouth every 6 (six) hours as needed. daily     amLODipine (NORVASC) 5 MG tablet Take 1 tablet (5 mg total) by mouth daily. 90 tablet 3   aspirin 81 MG EC tablet Take 1 tablet (81 mg total) by mouth daily.     atenolol (TENORMIN) 25 MG tablet TAKE (1/2) TABLET BY MOUTH ONCE DAILY. (Patient taking differently: Take 12.5 mg by mouth daily.) 45 tablet 2   azelastine (ASTELIN) 0.1 % nasal spray Place 1 spray into both nostrils 2 (two) times daily.     chlorhexidine (HIBICLENS) 4 % external liquid Cleanse the left hand twice daily until symptoms improve. 118 mL 0   chlorhexidine (PERIDEX) 0.12 % solution Gargle and spit 10 mL twice daily for the next 7 days. 120 mL 0   Cholecalciferol (VITAMIN D) 50 MCG (2000 UT) CAPS Take 2,000 Units by mouth daily.     diphenhydrAMINE (BENADRYL) 25 MG tablet Take 25 mg by mouth as needed for allergies.      EPINEPHrine 0.3 mg/0.3 mL IJ SOAJ injection Inject 0.3 mg into the muscle as needed for anaphylaxis.     famotidine (PEPCID) 20 MG tablet Take 20 mg by mouth. Takes as needed     Faricimab-svoa (VABYSMO IZ) by Intravitreal route. Monthly     FERROUS SULFATE ER PO Take 1 tablet by mouth daily. 325 mg daily     FLUoxetine (PROZAC) 10 MG tablet Take 10 mg by mouth daily.     fluticasone (FLONASE) 50 MCG/ACT nasal spray Place 1-2 sprays into both nostrils daily.     folic acid (FOLVITE) 1 MG tablet TAKE 2 TABLETS BY MOUTH DAILY. (  Patient taking differently: Take 2 mg by mouth daily.) 180 tablet 3   hydrOXYzine (ATARAX/VISTARIL) 25 MG tablet Take 25 mg by mouth every 8 (eight) hours as needed for anxiety or itching.     Investigational - Study Medication Study name: atorvastatin 40mg  or placebo.     levocetirizine (XYZAL) 5 MG tablet Take 5 mg by mouth every evening.       methotrexate (RHEUMATREX) 2.5 MG tablet Take 8 tablets (20 mg total) by mouth once a week. 96 tablet 0   montelukast (SINGULAIR) 10 MG tablet Take 10 mg by mouth at bedtime.     Multiple Vitamins-Minerals (ICAPS AREDS 2 PO) Take 1 capsule by mouth daily.     mupirocin ointment (BACTROBAN) 2 % Apply 1 Application topically 2 (two) times daily. 22 g 0   naproxen sodium (ALEVE) 220 MG tablet Take 220 mg by mouth daily as needed (pain).     Omega-3 Fatty Acids (FISH OIL) 1000 MG CAPS Take 1 capsule by mouth daily.     OVER THE COUNTER MEDICATION Prep h ointment and cream as needed     Testosterone (ANDROGEL TD) Place onto the skin daily.     triazolam (HALCION) 0.25 MG tablet Take 0.25 mg by mouth at bedtime.       Allergies as of 02/12/2023 - Review Complete 02/12/2023  Allergen Reaction Noted   Plaquenil [hydroxychloroquine sulfate]  11/06/2018   Lodine [etodolac] Swelling and Rash 01/29/2013    Family History  Problem Relation Age of Onset   Heart disease Mother    Pancreatic cancer Mother    Prostate cancer Father    Healthy Sister    Parkinson's disease Brother    Asthma Sister    Healthy Sister     Social History   Socioeconomic History   Marital status: Married    Spouse name: Not on file   Number of children: Not on file   Years of education: Not on file   Highest education level: Not on file  Occupational History   Not on file  Tobacco Use   Smoking status: Never    Passive exposure: Never   Smokeless tobacco: Never  Vaping Use   Vaping status: Never Used  Substance and Sexual Activity   Alcohol use: Not Currently    Comment: occas   Drug use: No   Sexual activity: Not on file  Other Topics Concern   Not on file  Social History Narrative   Not on file   Social Determinants of Health   Financial Resource Strain: Not on file  Food Insecurity: No Food Insecurity (01/03/2023)   Hunger Vital Sign    Worried About Running Out of Food in the Last Year: Never  true    Ran Out of Food in the Last Year: Never true  Transportation Needs: No Transportation Needs (01/03/2023)   PRAPARE - Administrator, Civil Service (Medical): No    Lack of Transportation (Non-Medical): No  Physical Activity: Not on file  Stress: Not on file  Social Connections: Not on file  Intimate Partner Violence: Not At Risk (01/03/2023)   Humiliation, Afraid, Rape, and Kick questionnaire    Fear of Current or Ex-Partner: No    Emotionally Abused: No    Physically Abused: No    Sexually Abused: No    Review of Systems: Gen: Denies fever, chills, loss of appetite, change in weight or weight loss CV: Denies chest pain, heart palpitations, syncope, edema  Resp:  Denies shortness of breath with rest, cough, wheezing GI: Denies dysphagia or odynophagia. Denies vomiting blood, jaundice, and fecal incontinence.  GU : Denies urinary burning, urinary frequency, urinary incontinence.  MS: Denies joint pain,swelling, cramping Derm: Denies rash, itching, dry skin Psych: Denies depression, anxiety,confusion, or memory loss Heme: Denies bruising, bleeding, and enlarged lymph nodes.  Physical Exam: Vital signs in last 24 hours: Temp:  [97 F (36.1 C)] 97 F (36.1 C) (08/27 1048) Pulse Rate:  [73] 73 (08/27 1048) Resp:  [14] 14 (08/27 1048) BP: (122)/(72) 122/72 (08/27 1048) SpO2:  [100 %] 100 % (08/27 1048) Weight:  [83.9 kg] 83.9 kg (08/27 1046)   General:   Alert,  Well-developed, well-nourished, pleasant and cooperative in NAD Head:  Normocephalic and atraumatic. Eyes:  Sclera clear, no icterus.   Conjunctiva pink. Ears:  Normal auditory acuity. Nose:  No deformity, discharge,  or lesions. Mouth:  No deformity or lesions, dentition normal. Neck:  Supple; no masses or thyromegaly. Lungs:  Clear throughout to auscultation.   No wheezes, crackles, or rhonchi. No acute distress. Heart:  Regular rate and rhythm; no murmurs, clicks, rubs,  or gallops. Abdomen:  Soft,  nontender and nondistended. No masses, hepatosplenomegaly or hernias noted. Normal bowel sounds, without guarding, and without rebound.   Rectal:  Deferred until time of colonoscopy.   Msk:  Symmetrical without gross deformities. Normal posture. Pulses:  Normal pulses noted. Extremities:  Without clubbing or edema. Neurologic:  Alert and  oriented x4;  grossly normal neurologically. Skin:  Intact without significant lesions or rashes. Cervical Nodes:  No significant cervical adenopathy. Psych:  Alert and cooperative. Normal mood and affect.  Intake/Output from previous day: No intake/output data recorded. Intake/Output this shift: No intake/output data recorded.  Lab Results: Recent Labs    02/12/23 1129  WBC 7.5  HGB 7.8*  HCT 25.2*  PLT 247   BMET Recent Labs    02/12/23 1129  NA 127*  K 3.8  CL 97*  CO2 20*  GLUCOSE 100*  BUN 19  CREATININE 0.90  CALCIUM 7.8*   LFT Recent Labs    02/12/23 1129  PROT 6.0*  ALBUMIN 2.3*  AST 26  ALT 25  ALKPHOS 153*  BILITOT 0.6   PT/INR No results for input(s): "LABPROT", "INR" in the last 72 hours. Hepatitis Panel No results for input(s): "HEPBSAG", "HCVAB", "HEPAIGM", "HEPBIGM" in the last 72 hours. C-Diff No results for input(s): "CDIFFTOX" in the last 72 hours.  Studies/Results: CT Head Wo Contrast  Result Date: 02/12/2023 CLINICAL DATA:  Mental status change of unknown cause. Dizziness. Lightheaded. Symptoms began 3 days ago. EXAM: CT HEAD WITHOUT CONTRAST TECHNIQUE: Contiguous axial images were obtained from the base of the skull through the vertex without intravenous contrast. RADIATION DOSE REDUCTION: This exam was performed according to the departmental dose-optimization program which includes automated exposure control, adjustment of the mA and/or kV according to patient size and/or use of iterative reconstruction technique. COMPARISON:  01/30/2023 FINDINGS: Brain: Age related volume loss. Mild chronic  small-vessel ischemic change of the pons. No evidence of old or acute supra tentorial infarction, mass lesion, hemorrhage, hydrocephalus or extra-axial collection. Vascular: No abnormal vascular finding. Skull: Negative Sinuses/Orbits: Clear/normal Other: None IMPRESSION: No acute CT finding. Age related volume loss. 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 Electronically Signed   By: Paulina Fusi M.D.   On: 02/12/2023 13:08    Impression: 77 y.o. year old male with medical history significant for hypertension, inflammatory arthritis,  fibromyalgia with [name] phenomenon, IDA, recent complicated diverticulitis with contained perforation in July 2024, most recent CT A/P with contrast 02/05/2023 with findings consistent with diverticulitis but would need to rule out underlying malignancy considering persistence of findings.  CT also noted colovesical fistula.  Patient presented to the emergency room with progressive fatigue and confusion, dizziness, lightheadedness that acutely worsened approximately 3 days ago, found to have worsening anemia with hemoglobin of 7.8.  GI consulted for further evaluation.  Acute on chronic anemia: Chronic anemia dating back at least to 2019 with baseline hemoglobin in the 11-12 range.  Hemoglobin declined to 10.8 in May, down to 7.3 in July and received 2 units PRBCs.  Most recent outpatient hemoglobin 8.6 on 8/14.  Hemoglobin 7.8 today.  Fecal occult blood positive.  Evidence of iron deficiency on labs in July and takes oral iron outpatient.  Previously reported toilet tissue hematochezia at his GI office visit in July, but denies this today.  He does report 2 days of black stools recently (8/25 and 8/26).  His most recent CT scan on 8/20 shows findings consistent with diverticulitis, but unable to rule out underlying malignancy considering persistence of findings.  Notably, patient denies any abdominal pain and his abdominal exam is completely benign today.  He  does report lack of appetite with poor p.o. intake since around March of this year, but denies any other significant upper GI symptoms.  Admits to taking Aleve intermittently due to fibromyalgia. Has documented 12 pound weight loss since March.  Etiology of IDA is not entirely clear.  He needs colonoscopy to rule out underlying colonic malignancy.  He also needs an upper endoscopy to rule out PUD, gastritis, duodenitis, H pylori, and malignancy.   In light of hyponatremia and patient eating breakfast this morning, we will push colonoscopy prep to tomorrow and plan for procedures on Thursday.  Hospitalist have started PPI infusion.    Hyponatremia: Management per hospitalist.  Colovesical fistula: Needs colonoscopy to rule out underlying malignancy.  Will also need to see surgery in the outpatient setting for management.  A referral has already been placed for this.  Urine analysis is suggestive of UTI.  Repeat urinalysis with reflex has been ordered by hospitalist.   Plan: Clear liquid diet. Plan for colon prep tomorrow with EGD and colonoscopy on Thursday. Okay to continue PPI infusion as ordered by hospitalist. Continue to monitor H&H and for overt GI bleeding.  Transfuse as necessary. Follow-up on pending anemia labs. Hold oral iron while inpatient. Management of hyponatremia per hospitalist. Outpatient evaluation for colovesical fistula.    LOS: 0 days    02/12/2023, 3:37 PM   Ermalinda Memos, [facility] Gastroenterology

## 2023-02-12 NOTE — ED Notes (Signed)
Pt's wife asking why is pt being admitted, Dr. Eloise Harman informed and said he will be in with wife when he can

## 2023-02-12 NOTE — Progress Notes (Signed)
Hi Cole Brown ,  Can you please call the patient and tell the CT scan still shows that he has diverticulitis still on the repeat CT scan  It Also shows constipation and a new finding of colovesical fistula ( connection between colon and bladder)  Plan:  Follow up with me in clinic on 02/19/2023 so I can examine the patient book for colonoscopy  Continue taking Miaralax twice daily   Ann: can we refer this patient to the surgeons for colovesical fistula    Thanks,  [name], MD Gastroenterology and Hepatology [facility] Gastroenterology

## 2023-02-13 DIAGNOSIS — M797 Fibromyalgia: Secondary | ICD-10-CM

## 2023-02-13 DIAGNOSIS — D62 Acute posthemorrhagic anemia: Secondary | ICD-10-CM | POA: Diagnosis not present

## 2023-02-13 DIAGNOSIS — I7300 Raynaud's syndrome without gangrene: Secondary | ICD-10-CM

## 2023-02-13 DIAGNOSIS — I10 Essential (primary) hypertension: Secondary | ICD-10-CM

## 2023-02-13 DIAGNOSIS — D649 Anemia, unspecified: Secondary | ICD-10-CM

## 2023-02-13 DIAGNOSIS — K5792 Diverticulitis of intestine, part unspecified, without perforation or abscess without bleeding: Secondary | ICD-10-CM | POA: Diagnosis not present

## 2023-02-13 DIAGNOSIS — E871 Hypo-osmolality and hyponatremia: Secondary | ICD-10-CM

## 2023-02-13 LAB — CBC
HCT: 24.9 % — ABNORMAL LOW (ref 39.0–52.0)
Hemoglobin: 7.8 g/dL — ABNORMAL LOW (ref 13.0–17.0)
MCH: 26.7 pg (ref 26.0–34.0)
MCHC: 31.3 g/dL (ref 30.0–36.0)
MCV: 85.3 fL (ref 80.0–100.0)
Platelets: 255 10*3/uL (ref 150–400)
RBC: 2.92 MIL/uL — ABNORMAL LOW (ref 4.22–5.81)
RDW: 18.6 % — ABNORMAL HIGH (ref 11.5–15.5)
WBC: 7 10*3/uL (ref 4.0–10.5)
nRBC: 0 % (ref 0.0–0.2)

## 2023-02-13 LAB — RPR
RPR Ser Ql: REACTIVE — AB
RPR Titer: 1:1 {titer}

## 2023-02-13 LAB — COMPREHENSIVE METABOLIC PANEL
ALT: 21 U/L (ref 0–44)
AST: 20 U/L (ref 15–41)
Albumin: 2 g/dL — ABNORMAL LOW (ref 3.5–5.0)
Alkaline Phosphatase: 139 U/L — ABNORMAL HIGH (ref 38–126)
Anion gap: 9 (ref 5–15)
BUN: 16 mg/dL (ref 8–23)
CO2: 19 mmol/L — ABNORMAL LOW (ref 22–32)
Calcium: 7.5 mg/dL — ABNORMAL LOW (ref 8.9–10.3)
Chloride: 101 mmol/L (ref 98–111)
Creatinine, Ser: 0.74 mg/dL (ref 0.61–1.24)
GFR, Estimated: >60 mL/min (ref >60)
Glucose, Bld: 86 mg/dL (ref 70–99)
Potassium: 3.6 mmol/L (ref 3.5–5.1)
Sodium: 129 mmol/L — ABNORMAL LOW (ref 135–145)
Total Bilirubin: 0.6 mg/dL (ref 0.3–1.2)
Total Protein: 5.4 g/dL — ABNORMAL LOW (ref 6.5–8.1)

## 2023-02-13 LAB — MAGNESIUM: Magnesium: 1.9 mg/dL (ref 1.7–2.4)

## 2023-02-13 MED ORDER — ADULT MULTIVITAMIN W/MINERALS CH
1.0000 | ORAL_TABLET | Freq: Every day | ORAL | Status: DC
  Administered 2023-02-13 – 2023-02-18 (×6): 1 via ORAL
  Filled 2023-02-13 (×6): qty 1

## 2023-02-13 MED ORDER — PROSOURCE PLUS PO LIQD
30.0000 mL | Freq: Three times a day (TID) | ORAL | Status: DC
  Administered 2023-02-13 – 2023-02-18 (×9): 30 mL via ORAL
  Filled 2023-02-13 (×9): qty 30

## 2023-02-13 MED ORDER — METOCLOPRAMIDE HCL 5 MG/ML IJ SOLN
10.0000 mg | Freq: Once | INTRAMUSCULAR | Status: AC
  Administered 2023-02-13: 10 mg via INTRAVENOUS
  Filled 2023-02-13: qty 2

## 2023-02-13 MED ORDER — PEG 3350-KCL-NA BICARB-NACL 420 G PO SOLR
4000.0000 mL | Freq: Once | ORAL | Status: AC
  Administered 2023-02-13: 4000 mL via ORAL

## 2023-02-13 MED ORDER — SODIUM CHLORIDE 0.9 % IV SOLN: INTRAVENOUS | Status: DC

## 2023-02-13 NOTE — Progress Notes (Signed)
Initial Nutrition Assessment  DOCUMENTATION CODES:   Not applicable  INTERVENTION:   -Boost Breeze po TID, each supplement provides 250 kcal and 9 grams of protein  -30 ml Prosource Plus TID, each supplement provides 100 kcals and 15 grams protein -MVI with minerals daily -RD will follow for diet advancement and adjust supplement regimen as appropriate  NUTRITION DIAGNOSIS:   Increased nutrient needs related to acute illness as evidenced by estimated needs.  GOAL:   Patient will meet greater than or equal to 90% of their needs  MONITOR:   PO intake, Supplement acceptance, Diet advancement  REASON FOR ASSESSMENT:   Malnutrition Screening Tool    ASSESSMENT:   Pt with medical history significant for hypertension, inflammatory arthritis, fibromyalgia with [name] phenomenon, and recent diverticulitis who presented with progressive fatigue and confusion as well as some dizziness and lightheadedness that acutely worsened approximately 3 days PTA.  Pt admitted with acute blood loss anemia.   Reviewed I/O's: +1.4 L x 24 hours  Pt unavailable at time of visit. Attempted to speak with pt via call to hospital room phone, however, unable to reach. RD unable to obtain further nutrition-related history or complete nutrition-focused physical exam at this time.    Per GI notes, pt with symptomatic anemia and concern for colovesical fistula. He endorses at 10 pound weight loss over the past 5 months. Plan for EGD and colonoscopy.    Pt currently on a clear liquid diet. Meal completions 75%.   Reviewed wt hx; pt has experienced a 10.3% wt loss over the past month, which is significant for time frame. Highly suspect malnutrition, however, unable to identify at this time. Pt would greatly benefit from addition of oral nutrition supplements.   Medications reviewed.   Labs reviewed: Na: 129.    Diet Order:   Diet Order             Diet clear liquid Room service appropriate? Yes;  Fluid consistency: Thin  Diet effective now                   EDUCATION NEEDS:   No education needs have been identified at this time  Skin:  Skin Assessment: Reviewed RN Assessment  Last BM:  02/12/23  Height:   Ht Readings from Last 1 Encounters:  02/12/23 5\' 11"  (1.803 m)    Weight:   Wt Readings from Last 1 Encounters:  02/12/23 79.4 kg    Ideal Body Weight:  78.2 kg  BMI:  Body mass index is 24.41 kg/m.  Estimated Nutritional Needs:   Kcal:  3244-0102  Protein:  115-130 grams  Fluid:  > 2 L    Levada Schilling, RD, LDN, CDCES Registered Dietitian II Certified Diabetes Care and Education Specialist Please refer to [facility] for RD and/or RD on-call/weekend/after hours pager

## 2023-02-13 NOTE — Care Management Important Message (Signed)
Important Message  Patient Details  Name: Cole Brown MRN: [id number] Date of Birth: 02-22-1946   Medicare Important Message Given:  N/A - LOS <3 / Initial given by admissions     [name] 02/13/2023, 9:47 AM

## 2023-02-13 NOTE — Progress Notes (Signed)
Mobility Specialist Progress Note:    02/13/23 1150  Mobility  Activity Ambulated with assistance in hallway  Level of Assistance Modified independent, requires aide device or extra time  Assistive Device None  Distance Ambulated (ft) 150 ft  Range of Motion/Exercises Active;All extremities  Activity Response Tolerated well  Mobility Referral Yes  $Mobility charge 1 Mobility  Mobility Specialist Start Time (ACUTE ONLY) 1150  Mobility Specialist Stop Time (ACUTE ONLY) 1200  Mobility Specialist Time Calculation (min) (ACUTE ONLY) 10 min   Pt was received standing in room, eager for mobility. Required Modi to ambulate with no AD, CGA for safety and security. Tolerated well, asx throughout. Returned pt sitting EOB, all needs met.   Lawerance Bach Mobility Specialist Please contact via Special educational needs teacher or  Rehab office at [phone]

## 2023-02-13 NOTE — Progress Notes (Signed)
Mobility Specialist Progress Note:    02/13/23 1415  Mobility  Activity Ambulated with assistance in hallway  Level of Assistance Modified independent, requires aide device or extra time  Assistive Device None  Distance Ambulated (ft) 210 ft  Range of Motion/Exercises Active;All extremities  Activity Response Tolerated well  Mobility Referral Yes  $Mobility charge 1 Mobility  Mobility Specialist Start Time (ACUTE ONLY) 1415  Mobility Specialist Stop Time (ACUTE ONLY) 1425  Mobility Specialist Time Calculation (min) (ACUTE ONLY) 10 min   Pt was received in chair, agreeable to mobility. Required Modi and CGA for security to ambulate with no AD. Tolerated well, asx throughout. Returned pt to chair, all needs met.   Lawerance Bach Mobility Specialist Please contact via Special educational needs teacher or  Rehab office at [phone]

## 2023-02-13 NOTE — Progress Notes (Signed)
Progress Note   Patient: Cole Brown:[id number] DOB: 1945-10-20 DOA: 02/12/2023     1 DOS: the patient was seen and examined on 02/13/2023   Brief hospital admission narrative: As per H&P written by Dr. [name] on 02/12/2023 Cole Brown is a 77 y.o. male with medical history significant for hypertension, inflammatory arthritis, fibromyalgia with [name] phenomenon, and recent diverticulitis who presented to the ED with progressive fatigue and confusion as well as some dizziness and lightheadedness that acutely worsened approximately 3 days ago.  This appears to be worsened with standing.  He appears to deny any abdominal pain, dark stools, or bloody stools.  He has had some increased pain and has been taking high doses of Tylenol up to eight 500 mg tablets per day.  Wife also reports a change in his personality with some "brain fog."   ED Course: Vital signs stable and orthostatic vitals currently pending.  1 unit PRBC ordered and pending.  EDP discussed with GI who recommends hospitalization for endoscopy upper and lower.  Urinalysis concerning for UTI.  He has been started on Protonix infusion.  Assessment and Plan: 1-acute blood loss anemia -Nonspecified gastro intestinal bleeding as patient's source of decreased hemoglobin -Endoscopy and colonoscopy planned/anticipated during this hospitalization by GI service -1 unit PRBCs has been provided and patient's hemoglobin 7.8 currently -Continue IV PPI, clear liquid diet and as needed antiemetics.  2-metabolic encephalopathy -At transfusion and fluid resuscitation patient's symptoms stabilized and mentation improved -Continue minimizing sedative agents -Will continue IV antibiotics for presumed UTI while following cultures -Continue to maintain adequate hydration and follow hemoglobin trend with intention to further transfuse as needed for hemoglobin less than 7.  3-chronic hyponatremia -Stable and at baseline -TSH within normal  limits -Continue to follow electrolytes.  4-recent diverticulitis -CT scan of the abdomen demonstrating still presence of diverticulitis with concerns for colovesicular fistula -Patient will require outpatient follow-up with general surgery and urology service -Current antibiotic for presumed UTI will also provide some coverage for residual diverticulitis.  5-presumed UTI -Empirically started on Rocephin -Continue to follow cultures results and sensitivity   6-hypertension -Blood pressure stable -Continue current antihypertensive agents.  7-fibromyalgia with intermittent [name] phenomenon -Continue Tylenol and as needed analgesics using oxycodone per home regimen -Holding methotrexate at the moment. -Continue outpatient follow-up with rheumatology service.  Subjective:  Still complaining of intermittent nausea; has not had any further vomiting.  Patient started on clear liquid diet with intention for bowel prep and anticipating endoscopic evaluation in the next 24 hours.  No fever, no chest pain, no abdominal pain, no dysuria.  Physical Exam: Vitals:   02/12/23 1928 02/12/23 2300 02/13/23 0415 02/13/23 1205  BP: 117/73 117/73 132/78 118/75  Pulse: 68 67 80 82  Resp: 18 18 18 20   Temp: 98.1 F (36.7 C) 98.2 F (36.8 C) 98.4 F (36.9 C) (!) 97.5 F (36.4 C)  TempSrc: Oral   Oral  SpO2: 99% 100% 100% 100%  Weight:      Height:       General exam: Alert, awake, oriented x 3; no overnight events. Respiratory system: Clear to auscultation. Respiratory effort normal. Cardiovascular system:RRR. No rubs or gallops; no JVD. Gastrointestinal system: Abdomen is nondistended, soft and nontender. No organomegaly or masses felt. Normal bowel sounds heard. Central nervous system: No focal neurological deficits. Extremities: No cyanosis or clubbing. Skin: No rashes or petechiae. Psychiatry: Judgement and insight appear appropriate on today's examination; following commands  adequately.  Flat affect.  Data  Reviewed: CBC: White blood cells 7.0, hemoglobin 7.8 and platelet count 255K Comprehensive metabolic panel: Sodium 129, potassium 3.6, chloride 101, bicarb 19, BUN 16, creatinine 0.74, alk phos 139, total bilirubin 0.6, AST 20, ALT 21 and GFR >60 Magnesium: 1.9  Family Communication: No family at bedside  Disposition: Status is: Inpatient Remains inpatient appropriate because: Close monitoring of patient hemoglobin trend with further transfusion as needed; follow GI service recommendations/evaluation for endoscopic procedures.   Planned Discharge Destination: Home  Time spent: 45 minutes  Author: [name], MD 02/13/2023 7:01 PM  For on call review [email or web address].

## 2023-02-13 NOTE — Progress Notes (Signed)
Patient was not able to tolerate GI prep. Patient started to vomit, IV Zofran given and Reglan IV ordered. Moderate amount of red blood noted in emesis bag. Dr. Gwenlyn Perking and GI made aware.

## 2023-02-13 NOTE — Progress Notes (Signed)
   02/13/23 1556  TOC Brief Assessment  Insurance and Status Reviewed  Patient has primary care physician Yes  Home environment has been reviewed with spouse  Prior level of function: independent  Prior/Current Home Services No current home services  Social Determinants of Health Reivew SDOH reviewed no interventions necessary  Readmission risk has been reviewed Yes  Transition of care needs no transition of care needs at this time   [facility] Screen Transition of Care Department [facility]) has reviewed patient and no TOC needs have been identified at this time. We will continue to monitor patient advancement through interdisciplinary progression rounds. If new patient transition needs arise, please place a TOC consult.

## 2023-02-13 NOTE — Progress Notes (Signed)
Briefly, chart reviewed, hgb stable at 7.8. our team recommended EGD and Colonoscopy (to evaluate for anemia/abnormal CT of colon) to be done tomorrow as patient ate yesterday. Prep and endo orders have been placed. Patient is on clear liquids and will be made NPO at midnight in preparation for EGD and Colonoscopy tomorrow.   Chenel Wernli L. Jeanmarie Hubert, MSN, APRN, AGNP-C Adult-Gerontology Nurse Practitioner [facility] at [facility]

## 2023-02-14 DIAGNOSIS — M797 Fibromyalgia: Secondary | ICD-10-CM | POA: Diagnosis not present

## 2023-02-14 DIAGNOSIS — D62 Acute posthemorrhagic anemia: Secondary | ICD-10-CM | POA: Diagnosis not present

## 2023-02-14 DIAGNOSIS — E43 Unspecified severe protein-calorie malnutrition: Secondary | ICD-10-CM | POA: Insufficient documentation

## 2023-02-14 DIAGNOSIS — K5792 Diverticulitis of intestine, part unspecified, without perforation or abscess without bleeding: Secondary | ICD-10-CM | POA: Diagnosis not present

## 2023-02-14 DIAGNOSIS — I10 Essential (primary) hypertension: Secondary | ICD-10-CM | POA: Diagnosis not present

## 2023-02-14 LAB — BASIC METABOLIC PANEL
Anion gap: 8 (ref 5–15)
BUN: 32 mg/dL — ABNORMAL HIGH (ref 8–23)
CO2: 21 mmol/L — ABNORMAL LOW (ref 22–32)
Calcium: 7.6 mg/dL — ABNORMAL LOW (ref 8.9–10.3)
Chloride: 100 mmol/L (ref 98–111)
Creatinine, Ser: 0.89 mg/dL (ref 0.61–1.24)
GFR, Estimated: >60 mL/min (ref >60)
Glucose, Bld: 99 mg/dL (ref 70–99)
Potassium: 4 mmol/L (ref 3.5–5.1)
Sodium: 129 mmol/L — ABNORMAL LOW (ref 135–145)

## 2023-02-14 LAB — CBC
HCT: 21.4 % — ABNORMAL LOW (ref 39.0–52.0)
HCT: 26.2 % — ABNORMAL LOW (ref 39.0–52.0)
Hemoglobin: 6.8 g/dL — CL (ref 13.0–17.0)
Hemoglobin: 8.6 g/dL — ABNORMAL LOW (ref 13.0–17.0)
MCH: 27 pg (ref 26.0–34.0)
MCH: 28.3 pg (ref 26.0–34.0)
MCHC: 31.8 g/dL (ref 30.0–36.0)
MCHC: 32.8 g/dL (ref 30.0–36.0)
MCV: 84.9 fL (ref 80.0–100.0)
MCV: 86.2 fL (ref 80.0–100.0)
Platelets: 283 10*3/uL (ref 150–400)
Platelets: 298 10*3/uL (ref 150–400)
RBC: 2.52 MIL/uL — ABNORMAL LOW (ref 4.22–5.81)
RBC: 3.04 MIL/uL — ABNORMAL LOW (ref 4.22–5.81)
RDW: 18.7 % — ABNORMAL HIGH (ref 11.5–15.5)
RDW: 17.2 % — ABNORMAL HIGH (ref 11.5–15.5)
WBC: 9.4 10*3/uL (ref 4.0–10.5)
WBC: 12 10*3/uL — ABNORMAL HIGH (ref 4.0–10.5)
nRBC: 0 % (ref 0.0–0.2)
nRBC: 0 % (ref 0.0–0.2)

## 2023-02-14 LAB — URINE CULTURE: Culture: >=100000 — AB

## 2023-02-14 LAB — T.PALLIDUM AB, TOTAL: T Pallidum Abs: NONREACTIVE

## 2023-02-14 LAB — PREPARE RBC (CROSSMATCH)

## 2023-02-14 MED ORDER — BISACODYL 5 MG PO TBEC: 5.0000 mg | DELAYED_RELEASE_TABLET | Freq: Once | ORAL | Status: DC

## 2023-02-14 MED ORDER — SODIUM CHLORIDE 0.9 % IV SOLN: INTRAVENOUS | Status: DC | PRN

## 2023-02-14 MED ORDER — BISACODYL 5 MG PO TBEC: 10.0000 mg | DELAYED_RELEASE_TABLET | Freq: Once | ORAL | Status: DC

## 2023-02-14 MED ORDER — SODIUM CHLORIDE 0.9% IV SOLUTION: Freq: Once | INTRAVENOUS | Status: AC

## 2023-02-14 MED ORDER — CHLORHEXIDINE GLUCONATE CLOTH 2 % EX PADS
6.0000 | MEDICATED_PAD | Freq: Every day | CUTANEOUS | Status: DC
  Administered 2023-02-14 – 2023-02-17 (×4): 6 via TOPICAL

## 2023-02-14 MED ORDER — POLYETHYLENE GLYCOL 3350 17 G PO PACK
68.0000 g | PACK | ORAL | Status: DC | PRN
  Filled 2023-02-14: qty 4

## 2023-02-14 MED ORDER — SODIUM CHLORIDE 0.9 % IV SOLN
25.0000 mg | Freq: Once | INTRAVENOUS | Status: AC
  Administered 2023-02-14: 25 mg via INTRAVENOUS
  Filled 2023-02-14: qty 1

## 2023-02-14 MED ORDER — PROMETHAZINE HCL 25 MG/ML IJ SOLN
INTRAMUSCULAR | Status: AC
  Filled 2023-02-14: qty 1

## 2023-02-14 MED ORDER — SODIUM CHLORIDE 0.9 % IV SOLN
1.0000 g | Freq: Four times a day (QID) | INTRAVENOUS | Status: DC
  Administered 2023-02-14: 1 g via INTRAVENOUS
  Filled 2023-02-14 (×4): qty 1000

## 2023-02-14 MED ORDER — FUROSEMIDE 10 MG/ML IJ SOLN
20.0000 mg | Freq: Once | INTRAMUSCULAR | Status: AC
  Administered 2023-02-14: 20 mg via INTRAVENOUS
  Filled 2023-02-14: qty 2

## 2023-02-14 MED ORDER — POLYETHYLENE GLYCOL 3350 17 G PO PACK
238.0000 g | PACK | Freq: Once | ORAL | Status: AC
  Administered 2023-02-14: 238 g via ORAL
  Filled 2023-02-14: qty 14

## 2023-02-14 MED ORDER — BISACODYL 5 MG PO TBEC
10.0000 mg | DELAYED_RELEASE_TABLET | Freq: Once | ORAL | Status: AC
  Administered 2023-02-14: 10 mg via ORAL
  Filled 2023-02-14: qty 2

## 2023-02-14 NOTE — TOC Initial Note (Signed)
Transition of Care [facility]) - Initial/Assessment Note    Patient Details  Name: Cole Brown MRN: [id number] Date of Birth: 21-Jan-1946  Transition of Care [facility]) CM/SW Contact:    Annice Needy, LCSW Phone Number: 02/14/2023, 1:31 PM  Clinical Narrative:                 Patient from home with spouse. Independent at baseline. Admitted for acute blood loss anemia. TOC will follow and address needs as they arise.   Expected Discharge Plan: Home/Self Care Barriers to Discharge: Continued Medical Work up   Patient Goals and CMS Choice Patient states their goals for this hospitalization and ongoing recovery are:: patient plans to d/c home          Expected Discharge Plan and Services       Living arrangements for the past 2 months: Single Family Home                                      Prior Living Arrangements/Services Living arrangements for the past 2 months: Single Family Home Lives with:: Spouse                   Activities of Daily Living Home Assistive Devices/Equipment: None ADL Screening (condition at time of admission) Patient's cognitive ability adequate to safely complete daily activities?: Yes Is the patient deaf or have difficulty hearing?: No Does the patient have difficulty seeing, even when wearing glasses/contacts?: Yes Does the patient have difficulty concentrating, remembering, or making decisions?: No Patient able to express need for assistance with ADLs?: Yes Does the patient have difficulty dressing or bathing?: Yes Independently performs ADLs?: Yes (appropriate for developmental age) Does the patient have difficulty walking or climbing stairs?: No Weakness of Legs: None Weakness of Arms/Hands: None  Permission Sought/Granted                  Emotional Assessment       Orientation: : Oriented to Self, Oriented to Place, Oriented to  Time, Oriented to Situation Alcohol / Substance Use: Not Applicable    Admission  diagnosis:  Acute blood loss anemia [D62] Patient Active Problem List   Diagnosis Date Noted   Acute blood loss anemia 02/12/2023   Perforation of sigmoid colon (HCC) 01/08/2023   Acute post-hemorrhagic anemia 01/08/2023   Diverticulitis 01/03/2023   Hyponatremia 01/03/2023   Hematochezia 12/25/2022   Diverticulosis of colon without hemorrhage 12/25/2022   Diarrhea 03/30/2021   Hemorrhoids 03/30/2021   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   Lower abdominal pain 07/06/2019   Autoimmune disease (HCC) 09/15/2018   [name] disease without gangrene 09/15/2018   Hypercholesteremia 08/21/2018   History of diverticulitis 08/21/2018   History of iron deficiency anemia 08/21/2018   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   Fibromyalgia 08/21/2018   History of multiple allergies 08/21/2018   History of sleep apnea 08/21/2018   Rectal bleed 11/14/2016   Essential hypertension 11/14/2016   PCP:  Elfredia Nevins, MD Pharmacy:   [facility] - [place], [place] - [phone] [place] [address] Phone: [phone] Fax: [phone]  [facility] [phone] - [place], [place] - [address] AT [facility] [address] [place] [address] Phone: [phone] Fax: [phone]     Social Determinants of Health (SDOH) Social History: SDOH Screenings   Food Insecurity: No Food Insecurity (02/12/2023)  Housing: Low Risk  (02/12/2023)  Transportation Needs: No Transportation Needs (02/12/2023)  Utilities: At Risk (02/12/2023)  Tobacco Use: Low Risk  (02/12/2023)   SDOH Interventions:     Readmission Risk Interventions     No data to display

## 2023-02-14 NOTE — Progress Notes (Signed)
Rapid response called for large volume hematemesis. Patient already has GI consult. He is on protonix drip. He received blood earlier on day shift. Repeat HH shows hgb 8.6. Holding 1 unit ahead. Saw patient at bedside in the ICU. He was alert, oriented, and reported "feeling pretty good." Abdomen is non tender. He does have some peripheral edema. HR is slightly elevated at the time of my exam, but his BP is stable. Will continue to monitor.

## 2023-02-14 NOTE — Progress Notes (Addendum)
Progress Note   Patient: Cole Brown:[id number] DOB: 11-15-1945 DOA: 02/12/2023     2 DOS: the patient was seen and examined on 02/14/2023   Brief hospital admission narrative: As per H&P written by Dr. [name] on 02/12/2023 Cole Brown is a 77 y.o. male with medical history significant for hypertension, inflammatory arthritis, fibromyalgia with [name] phenomenon, and recent diverticulitis who presented to the ED with progressive fatigue and confusion as well as some dizziness and lightheadedness that acutely worsened approximately 3 days ago.  This appears to be worsened with standing.  He appears to deny any abdominal pain, dark stools, or bloody stools.  He has had some increased pain and has been taking high doses of Tylenol up to eight 500 mg tablets per day.  Wife also reports a change in his personality with some "brain fog."   ED Course: Vital signs stable and orthostatic vitals currently pending.  1 unit PRBC ordered and pending.  EDP discussed with GI who recommends hospitalization for endoscopy upper and lower.  Urinalysis concerning for UTI.  He has been started on Protonix infusion.  Assessment and Plan: 1-acute blood loss anemia -Nonspecified gastro intestinal bleeding as patient's source of decreased hemoglobin -Overnight with multiple episodes of bloody emesis and inability to tolerate bowel prep.  Hemoglobin down to 6.8. - a total of 3 units PRBCs has been provided to stabilize hemoglobin level -Per GI service plan is for endoscopy and colonoscopy on 02/15/2023.   -Continue IV PPI, clear liquid diet and as needed antiemetics. -N.p.o. after midnight.  2-metabolic encephalopathy -At transfusion and fluid resuscitation patient's symptoms stabilized and mentation improved -Continue minimizing sedative agents -Will continue IV antibiotics for presumed UTI while following cultures -Continue to maintain adequate hydration and follow hemoglobin trend with intention to further  transfuse as needed for hemoglobin less than 7. -Given hemoglobin of 6.8 on today's examination patient will receive 2 units of PRBCs.  3-chronic hyponatremia -Stable and at baseline -TSH within normal limits -Continue to follow electrolytes. -Sodium level 129.  4-recent diverticulitis -CT scan of the abdomen demonstrating still presence of diverticulitis with concerns for colovesicular fistula -Patient will require outpatient follow-up with general surgery and urology service -Current antibiotic for presumed UTI will also provide some coverage for residual diverticulitis.  5-Enterococcus UTI -Empirically started on Rocephin and antibiotics transition to ampicillin based on culture results. -Continue to maintain adequate hydration -Patient reporting no dysuria currently.  6-hypertension -Blood pressure stable -Continue current antihypertensive agents. -Continue to follow vital signs.  7-fibromyalgia with intermittent [name] phenomenon -Continue Tylenol and as needed analgesics using oxycodone per home regimen -Holding methotrexate at the moment. -Continue outpatient follow-up with rheumatology service.  8-moderate protein calorie malnutrition -Appreciate assistance and recommendation by nutritional service Continue feeding supplement -Patient advised to maintain adequate hydration.  Subjective:  Unable to tolerate bowel prep; multiple bloody emesis overnight with the presence of decreased hemoglobin down to 6.8.  Patient has remained hemodynamically stable otherwise.  No fever, no shortness of breath, no chest pain.  Physical Exam: Vitals:   02/14/23 0309 02/14/23 0940 02/14/23 0957 02/14/23 1251  BP: 130/63 113/67 118/74 (!) 140/78  Pulse: 79 81 81 84  Resp: 20 16 17 17   Temp: 97.9 F (36.6 C) 98.1 F (36.7 C) 98.1 F (36.7 C) 97.8 F (36.6 C)  TempSrc:  Oral Oral Oral  SpO2: 100% 100% 100% 100%  Weight:      Height:       General exam: Alert, awake, following  commands appropriately and in no acute distress.  Patient with multiple episodes of bloody emesis while trying to bowel prep.  Overnight bowel prep was aborted and patient symptoms stabilized with the use of Reglan and Zofran.  No chest pain, no abdominal pain, stable vital signs. Respiratory system: Clear to auscultation. Respiratory effort normal.  Good saturation on room air. Cardiovascular system:RRR. No rubs or gallops; no JVD. Gastrointestinal system: Abdomen is nondistended, soft and nontender. No organomegaly or masses felt. Normal bowel sounds heard. Central nervous system: Alert and oriented. No focal neurological deficits. Extremities: No cyanosis, clubbing or edema. Skin: No petechiae. Psychiatry: Judgement and insight appear normal.  Flat affect  Data Reviewed: CBC: White blood cells 9.4, hemoglobin 6.8, platelet count 283K Basic metabolic panel: Sodium 129, potassium 4.0, chloride 100, bicarb 21, BUN 32, creatinine 0.89 and GFR >60  Family Communication: No family at bedside  Disposition: Status is: Inpatient Remains inpatient appropriate because: Close monitoring of patient hemoglobin trend with further transfusion as needed; follow GI service recommendations/evaluation for endoscopic procedures.   Planned Discharge Destination: Home  Time spent: 45 minutes  Author: [name], MD 02/14/2023 5:45 PM  For on call review [email or web address].

## 2023-02-14 NOTE — Progress Notes (Signed)
RT responded to raid response.  Patient O2 sat 100% on room air at this time.  No respiratory distress noted.

## 2023-02-14 NOTE — Progress Notes (Addendum)
Nutrition Follow-up  DOCUMENTATION CODES:   Severe malnutrition in context of acute illness/injury  INTERVENTION:   -Continue Boost Breeze po TID, each supplement provides 250 kcal and 9 grams of protein  -Continue 30 ml Prosource Plus TID, each supplement provides 100 kcals and 15 grams protein -Continue MVI with minerals daily -RD will follow for diet advancement and adjust supplement regimen as appropriate  NUTRITION DIAGNOSIS:   Severe Malnutrition related to acute illness (diverticulitis) as evidenced by mild fat depletion, mild muscle depletion, moderate muscle depletion, percent weight loss.  Ongoing  GOAL:   Patient will meet greater than or equal to 90% of their needs  Progressing   MONITOR:   PO intake, Supplement acceptance, Diet advancement  REASON FOR ASSESSMENT:   Malnutrition Screening Tool    ASSESSMENT:   Pt with medical history significant for hypertension, inflammatory arthritis, fibromyalgia with [name] phenomenon, and recent diverticulitis who presented with progressive fatigue and confusion as well as some dizziness and lightheadedness that acutely worsened approximately 3 days PTA.  Reviewed I/O's: +571 ml x 24 hours and +1.9 L since admission   Per GI notes, pt with symptomatic anemia and concern for colovesical fistula. Plan for EDG and colonoscopy tomorrow.   Pt in recliner chair at time of visit. He deferred most of the history to his wife, who has compiled a binder of detailed notes regarding pt's medical status over the past 4-6 weeks. Wife shares that she sporadically saw the pt decline since March, however, this has worsened over the past 6 weeks. At baseline, she describes pt as very active, vibrant person with an extremely hearty appetite. Intake has declined significantly over the past 6 weeks as pt has no appetite or desire to eat, often eating bites and sips at meals. Wife expressed concern that pt is now so weak that he needs  assistance with most ADLs, including going to the bathroom. Pt has not had much to eat since admission, due to colonoscopy prep. Pt's last oral intake was on 02/11/23, which consisted of a few bites of soup. Wife anxious that miralax regimen will work and is hopeful that testing will provide some further insight into pt's condition; she expresses frustration that he has had a lot of medical work-up without a definitive diagnosis. Emotional support provided.   Pt currently on a clear liquid diet. Noted meal completions 75-100%. Pt complains on hunger and is eager to eat when able. Noted variable acceptance of supplements.   Pt wife endorses wt loss, but unsure how much. Unsure of UBW. Reviewed wt hx; pt has experienced a 10.3% wt loss over the past month, which is significant for time frame.   Medications reviewed and include miralax.   Labs reviewed: Na: 129.    NUTRITION - FOCUSED PHYSICAL EXAM:  Flowsheet Row Most Recent Value  Orbital Region Moderate depletion  Upper Arm Region Mild depletion  Thoracic and Lumbar Region No depletion  Buccal Region Mild depletion  Temple Region Moderate depletion  Clavicle Bone Region Moderate depletion  Clavicle and Acromion Bone Region Moderate depletion  Scapular Bone Region Moderate depletion  Dorsal Hand Moderate depletion  Patellar Region No depletion  Anterior Thigh Region No depletion  Posterior Calf Region No depletion  Edema (RD Assessment) None  Hair Reviewed  Eyes Reviewed  Mouth Reviewed  Skin Reviewed  Nails Reviewed       Diet Order:   Diet Order             Diet NPO  time specified  Diet effective midnight           Diet clear liquid Room service appropriate? Yes; Fluid consistency: Thin  Diet effective now                   EDUCATION NEEDS:   No education needs have been identified at this time  Skin:  Skin Assessment: Reviewed RN Assessment  Last BM:  02/13/23  Height:   Ht Readings from Last 1 Encounters:   02/12/23 5\' 11"  (1.803 m)    Weight:   Wt Readings from Last 1 Encounters:  02/12/23 79.4 kg    Ideal Body Weight:  78.2 kg  BMI:  Body mass index is 24.41 kg/m.  Estimated Nutritional Needs:   Kcal:  1610-9604  Protein:  115-130 grams  Fluid:  > 2 L    Levada Schilling, RD, LDN, CDCES Registered Dietitian II Certified Diabetes Care and Education Specialist Please refer to [facility] for RD and/or RD on-call/weekend/after hours pager

## 2023-02-14 NOTE — Progress Notes (Addendum)
RN called RRT for Pt after vomiting a large amount of bright red blood 1 full blue vomiting bag. Vitals taken after blood infused and BP was soft, Pt began to c/o nausea vomiting, dizziness, and decrease sensation in his hands and feet. RRT team arrived, new vitals taken and pt transferred to ICU. Report given to ICU at bedside.   Pt had started Bowel Prep with Day shift nurse. This RN witnessed one cup of bowel prep completed mixed with Gatorade.

## 2023-02-14 NOTE — Plan of Care (Signed)
Alert, oriented. Receiving 2nd ordered unit of PRBCs at this time. Miralax bowel prep initiated with positive result. Educated patient on importance of completing entire prep as ordered.  Care order received from GI NP C Mahon to continue prep even if not completed at midnight.     Problem: Education: Goal: Knowledge of General Education information will improve Description: Including pain rating scale, medication(s)/side effects and non-pharmacologic comfort measures Outcome: Progressing   Problem: Health Behavior/Discharge Planning: Goal: Ability to manage health-related needs will improve Outcome: Progressing   Problem: Clinical Measurements: Goal: Ability to maintain clinical measurements within normal limits will improve Outcome: Progressing Goal: Will remain free from infection Outcome: Progressing Goal: Diagnostic test results will improve Outcome: Progressing Goal: Respiratory complications will improve Outcome: Progressing Goal: Cardiovascular complication will be avoided Outcome: Progressing   Problem: Activity: Goal: Risk for activity intolerance will decrease Outcome: Progressing   Problem: Nutrition: Goal: Adequate nutrition will be maintained Outcome: Progressing   Problem: Coping: Goal: Level of anxiety will decrease Outcome: Progressing   Problem: Elimination: Goal: Will not experience complications related to bowel motility Outcome: Progressing Goal: Will not experience complications related to urinary retention Outcome: Progressing   Problem: Pain Managment: Goal: General experience of comfort will improve Outcome: Progressing   Problem: Safety: Goal: Ability to remain free from injury will improve Outcome: Progressing   Problem: Skin Integrity: Goal: Risk for impaired skin integrity will decrease Outcome: Progressing

## 2023-02-14 NOTE — Progress Notes (Signed)
Gastroenterology Progress Note   Referring Provider: No ref. provider found Primary Care Physician:  Elfredia Nevins, MD Primary Gastroenterologist:  Dr. Tasia Catchings  Patient ID: Cole Brown; [id number]; [birth year]    Subjective   No pain. wife reports weakness and lack of appetite. States was unable to tolerate prep yesterday due to taste and volume issues. Did not drink prep after receiving Reglan last night. He had hematemesis shortly after attempting golytely last night. No BM overnight.   Objective   Vital signs in last 24 hours Temp:  [97.5 F (36.4 C)-97.9 F (36.6 C)] 97.9 F (36.6 C) (08/29 0309) Pulse Rate:  [79-82] 79 (08/29 0309) Resp:  [18-20] 20 (08/29 0309) BP: (118-130)/(63-75) 130/63 (08/29 0309) SpO2:  [100 %] 100 % (08/29 0309) Last BM Date : 02/13/23  Physical Exam General:   Alert and oriented, pleasant Head:  Normocephalic and atraumatic. Eyes:  No icterus, sclera clear. Conjuctiva pink.  Mouth:  Without lesions, mucosa pink and moist.  Neck:  Supple, without thyromegaly or masses.  Abdomen:  Bowel sounds present, soft, non-tender, non-distended. No HSM or hernias noted. No rebound or guarding. No masses appreciated  Msk:  Symmetrical without gross deformities. Normal posture. Extremities:  Without clubbing or edema. Neurologic:  Alert and  oriented x4;  grossly normal neurologically. Skin:  Warm and dry, intact without significant lesions.  Psych:  Alert and cooperative. Normal mood and affect.  Intake/Output from previous day: 08/28 0701 - 08/29 0700 In: 570.5 [P.O.:240; I.V.:230.5; IV Piggyback:100] Out: -  Intake/Output this shift: No intake/output data recorded.  Lab Results  Recent Labs    02/12/23 1129 02/13/23 0458 02/14/23 0517  WBC 7.5 7.0 9.4  HGB 7.8* 7.8* 6.8*  HCT 25.2* 24.9* 21.4*  PLT 247 255 283   BMET Recent Labs    02/12/23 1129 02/13/23 0458 02/14/23 0517  NA 127* 129* 129*  K 3.8 3.6 4.0  CL 97* 101 100  CO2  20* 19* 21*  GLUCOSE 100* 86 99  BUN 19 16 32*  CREATININE 0.90 0.74 0.89  CALCIUM 7.8* 7.5* 7.6*   LFT Recent Labs    02/12/23 1129 02/13/23 0458  PROT 6.0* 5.4*  ALBUMIN 2.3* 2.0*  AST 26 20  ALT 25 21  ALKPHOS 153* 139*  BILITOT 0.6 0.6   PT/INR No results for input(s): "LABPROT", "INR" in the last 72 hours. Hepatitis Panel No results for input(s): "HEPBSAG", "HCVAB", "HEPAIGM", "HEPBIGM" in the last 72 hours.  Studies/Results CT Head Wo Contrast  Result Date: 02/12/2023 CLINICAL DATA:  Mental status change of unknown cause. Dizziness. Lightheaded. Symptoms began 3 days ago. EXAM: CT HEAD WITHOUT CONTRAST TECHNIQUE: Contiguous axial images were obtained from the base of the skull through the vertex without intravenous contrast. RADIATION DOSE REDUCTION: This exam was performed according to the departmental dose-optimization program which includes automated exposure control, adjustment of the mA and/or kV according to patient size and/or use of iterative reconstruction technique. COMPARISON:  01/30/2023 FINDINGS: Brain: Age related volume loss. 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 No evidence of old or acute supra tentorial infarction, mass lesion, hemorrhage, hydrocephalus or extra-axial collection. Vascular: No abnormal vascular finding. Skull: Negative Sinuses/Orbits: Clear/normal Other: None IMPRESSION: No acute CT finding. Age related volume loss. 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 Electronically Signed   By: Paulina Fusi M.D.   On: 02/12/2023 13:08   CT ABDOMEN PELVIS W CONTRAST  Result Date: 02/12/2023 CLINICAL DATA:  Diverticulitis, complication suspected  assess perforation and diverticulitis EXAM: CT ABDOMEN AND PELVIS WITH CONTRAST TECHNIQUE: Multidetector CT imaging of the abdomen and pelvis was performed using the standard protocol following bolus administration of intravenous contrast. RADIATION DOSE REDUCTION: This exam was performed  according to the departmental dose-optimization program which includes automated exposure control, adjustment of the mA and/or kV according to patient size and/or use of iterative reconstruction technique. CONTRAST:  OMNIPAQUE IOHEXOL 300 MG/ML  SOLN COMPARISON:  01/07/2023 FINDINGS: Lower chest: No acute abnormality Hepatobiliary: No focal liver abnormality is seen. Status post cholecystectomy. No biliary dilatation. Pancreas: No focal abnormality or ductal dilatation. Spleen: Calcifications throughout the spleen compatible with old granulomatous disease. Normal size. Adrenals/Urinary Tract: Adrenal glands normal. 6.7 cm left upper pole renal cyst appears benign and stable. No follow-up imaging recommended. No stones or hydronephrosis. Continued abnormal wall thickening within the anterior superior bladder wall adjacent to the sigmoid colon. There is contrast and air within the bladder wall or bladder lumen compatible with colovesical fistula. Stomach/Bowel: Again seen is abnormal segment of mid sigmoid colon with wall thickening and surrounding inflammation. Overall, appearance has improved somewhat, but continues to be abnormal compatible with diverticulitis. 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 Scattered colonic diverticula elsewhere. Moderate stool burden. Stomach and small bowel decompressed, unremarkable. Vascular/Lymphatic: Aortic atherosclerosis. No evidence of aneurysm or adenopathy. Reproductive: No visible focal abnormality. Other: No free fluid or free air. Small right inguinal hernia containing fat. Musculoskeletal: No acute bony abnormality. Multilevel degenerative changes throughout the lumbar spine. IMPRESSION: Continued abnormal sigmoid colon with wall thickening and surrounding inflammation most compatible with diverticulitis although the persistence of this finding for some time is somewhat concerning. Consider further  evaluation with colonoscopy after acute symptoms resolve to exclude malignancy. Abnormal wall thickening in the anterior superior bladder wall with gas and contrast now seen in the bladder wall or bladder lumen compatible with colovesical fistula. Moderate stool burden throughout the colon. Aortic atherosclerosis. Electronically Signed   By: Charlett Nose M.D.   On: 02/12/2023 08:02   DG Chest 2 View  Result Date: 01/31/2023 CLINICAL DATA:  Fall, weakness, and pain.  History of fibromyalgia. EXAM: CHEST - 2 VIEW COMPARISON:  11/14/2016. FINDINGS: The heart size and mediastinal contours are within normal limits. There is atherosclerotic calcification of the aorta. No consolidation, effusion, or pneumothorax. Degenerative changes are present in the thoracic spine. No acute osseous abnormality. IMPRESSION: No active cardiopulmonary disease. Electronically Signed   By: Thornell Sartorius M.D.   On: 01/31/2023 00:22   CT HEAD WO CONTRAST ( )  Result Date: 01/31/2023 CLINICAL DATA:  Recent falls, fatigue/weakness, pain EXAM: CT HEAD WITHOUT CONTRAST TECHNIQUE: Contiguous axial images were obtained from the base of the skull through the vertex without intravenous contrast. RADIATION DOSE REDUCTION: This exam was performed according to the departmental dose-optimization program which includes automated exposure control, adjustment of the mA and/or kV according to patient size and/or use of iterative reconstruction technique. COMPARISON:  MRI brain dated 01/15/2023 FINDINGS: Brain: No evidence of acute infarction, hemorrhage, hydrocephalus, extra-axial collection or mass lesion/mass effect. Subcortical white matter and periventricular small vessel ischemic changes. Vascular: No hyperdense vessel or unexpected calcification. Skull: Normal. Negative for fracture or focal lesion. Sinuses/Orbits: The visualized paranasal sinuses are essentially clear. The mastoid air cells are unopacified. Other: None. IMPRESSION: No acute  intracranial abnormality. Small vessel ischemic changes. Electronically Signed   By: Charline Bills M.D.   On: 01/31/2023  00:12   MR BRAIN WO CONTRAST  Result Date: 01/15/2023 CLINICAL DATA:  Mental status change, unknown cause EXAM: MRI HEAD WITHOUT CONTRAST TECHNIQUE: Multiplanar, multiecho pulse sequences of the brain and surrounding structures were obtained without intravenous contrast. COMPARISON:  12/29/16 MRI brain FINDINGS: Brain: Negative for acute infarct. No hemorrhage. No hydrocephalus. No extra-axial fluid collection. Mass effect. No mass lesion. There is sequela of very mild chronic microvascular ischemic change. Redemonstrated mild cerebellar tonsillar ectopia, unchanged compared to 2018. Vascular: Normal flow voids. Skull and upper cervical spine: Mild degenerative changes in the upper cervical spine of the C4-C5 level Sinuses/Orbits: No middle ear or mastoid effusion. Paranasal sinuses are clear. Bilateral lens replacement. Other: None. IMPRESSION: No acute intracranial process. Electronically Signed   By: Lorenza Cambridge M.D.   On: 01/15/2023 12:58    Assessment  77 y.o. male with a history of inflammatory arthritis, fibromyalgia with [name] phenomenon, IDA, HTN, recent complicated diverticulitis with contained perforation in July 2024, most recent CT A/P with contrast 02/05/2023 with findings consistent with diverticulitis but would need to rule out underlying malignancy considering persistence of findings and also noted colovesical fistula.  He presented to the ED with progressive confusion, fatigue, dizziness, lightheadedness that acutely worsened 3 days prior and found to have worsening anemia with a hemoglobin of 7.8.  GI consulted for further evaluation.  Acute on chronic anemia: Anemia dating back to 2019 with baseline hemoglobin 11-12.  This past May hemoglobin declined to 10.8 and was down to 7.3 in July in which time he received 2 units PRBCs.  Most recent outpatient hemoglobin  8.6 on 8/14.  On day of admission hemoglobin down to 7.8 with heme positive stool.  He did have evidence of iron deficiency on labs in July and has been taking oral iron outpatient.  Previously reported toilet tissue hematochezia however has denied any this admission.  CT on 8/21 findings consistent with diverticulitis but unable to rule out malignancy.  Continues to deny any abdominal pain and exam is benign.  He has previously been taking NSAIDs due to fibromyalgia.  Etiology unclear at this time, has been attempting to get workup with EGD and colonoscopy since July however given concerns for diverticulitis this has been postponed.  Given his anemia he is in need of evaluation with EGD and colonoscopy to rule out malignancy, gastritis, duodenitis, esophagitis, H. pylori, and AVMs.  Attempted at prepping for procedures yesterday which was unsuccessful given vomiting and hematemesis yesterday evening.  Given issues with GoLytely we will prep with MiraLAX prep today and plan for procedure tomorrow.  Given attempt at procedures, p.o. iron on hold.  Provided education regarding bowel prep and potential differentials to patient and wife today. All questions answered.   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  As stated above needs colonoscopy to rule out malignancy.  Will likely require surgery evaluation outpatient for management which she is due to see next month.  UA this admission negative.  Plan / Recommendations  Miralax prep with gatorade plus dulcolax, miscellaneous instructions provided Clear liquids today NPO midnight, may drink prep until 0900 if not clear by then Tap water enema x2 tomorrow CBC this afternoon and tomorrow morning Continue PPI infusion until after procedures, then transition to IBID Reglan if needed for nausea during prep, discussed with Dr. Gwenlyn Perking    LOS: 2 days   02/14/2023, 9:13 AM  Brooke Bonito, MSN, FNP-BC, AGACNP-BC [facility]

## 2023-02-14 NOTE — Progress Notes (Signed)
Mobility Specialist Progress Note:  P  02/14/23 0800  Mobility  Activity Ambulated with assistance to bathroom  Level of Assistance Contact guard assist, steadying assist  Assistive Device Front wheel walker  Distance Ambulated (ft) 10 ft  Range of Motion/Exercises Active;All extremities  Activity Response Tolerated well  Mobility Referral Yes  $Mobility charge 1 Mobility  Mobility Specialist Start Time (ACUTE ONLY) 0800  Mobility Specialist Stop Time (ACUTE ONLY) 0810  Mobility Specialist Time Calculation (min) (ACUTE ONLY) 10 min   Pt received in bed, agreeable to mobility, wife at bedside. Required CGA to stand and ambulate with RW. Requested assistance to BR. Deferred further ambulation d/t lightheadedness. Returned pt to chair, BP 97/69(78) after ambulation. All needs met.   Lawerance Bach Mobility Specialist Please contact via Special educational needs teacher or  Rehab office at [phone]

## 2023-02-15 ENCOUNTER — Encounter (HOSPITAL_COMMUNITY)
Admission: EM | Disposition: A | Discharge status: Home / Self Care | Payer: Self-pay | Source: Home / Self Care | Attending: Internal Medicine

## 2023-02-15 ENCOUNTER — Telehealth: Payer: Self-pay | Admitting: Gastroenterology

## 2023-02-15 ENCOUNTER — Inpatient Hospital Stay (HOSPITAL_COMMUNITY): Payer: Medicare HMO | Admitting: Anesthesiology

## 2023-02-15 DIAGNOSIS — D509 Iron deficiency anemia, unspecified: Secondary | ICD-10-CM | POA: Diagnosis not present

## 2023-02-15 DIAGNOSIS — K921 Melena: Secondary | ICD-10-CM

## 2023-02-15 DIAGNOSIS — I10 Essential (primary) hypertension: Secondary | ICD-10-CM

## 2023-02-15 DIAGNOSIS — K259 Gastric ulcer, unspecified as acute or chronic, without hemorrhage or perforation: Secondary | ICD-10-CM

## 2023-02-15 DIAGNOSIS — K922 Gastrointestinal hemorrhage, unspecified: Secondary | ICD-10-CM

## 2023-02-15 DIAGNOSIS — K5792 Diverticulitis of intestine, part unspecified, without perforation or abscess without bleeding: Secondary | ICD-10-CM | POA: Diagnosis not present

## 2023-02-15 DIAGNOSIS — D62 Acute posthemorrhagic anemia: Secondary | ICD-10-CM | POA: Diagnosis not present

## 2023-02-15 DIAGNOSIS — M797 Fibromyalgia: Secondary | ICD-10-CM | POA: Diagnosis not present

## 2023-02-15 DIAGNOSIS — K264 Chronic or unspecified duodenal ulcer with hemorrhage: Secondary | ICD-10-CM

## 2023-02-15 HISTORY — PX: BIOPSY: SHX5522

## 2023-02-15 HISTORY — PX: COLONOSCOPY WITH PROPOFOL: SHX5780

## 2023-02-15 HISTORY — PX: ESOPHAGOGASTRODUODENOSCOPY (EGD) WITH PROPOFOL: SHX5813

## 2023-02-15 HISTORY — PX: SCLEROTHERAPY: SHX6841

## 2023-02-15 HISTORY — PX: HOT HEMOSTASIS: SHX5433

## 2023-02-15 LAB — BASIC METABOLIC PANEL
Anion gap: 8 (ref 5–15)
BUN: 41 mg/dL — ABNORMAL HIGH (ref 8–23)
CO2: 20 mmol/L — ABNORMAL LOW (ref 22–32)
Calcium: 7.5 mg/dL — ABNORMAL LOW (ref 8.9–10.3)
Chloride: 100 mmol/L (ref 98–111)
Creatinine, Ser: 0.8 mg/dL (ref 0.61–1.24)
GFR, Estimated: >60 mL/min (ref >60)
Glucose, Bld: 116 mg/dL — ABNORMAL HIGH (ref 70–99)
Potassium: 3.9 mmol/L (ref 3.5–5.1)
Sodium: 128 mmol/L — ABNORMAL LOW (ref 135–145)

## 2023-02-15 LAB — HEMOGLOBIN AND HEMATOCRIT, BLOOD
HCT: 25.4 % — ABNORMAL LOW (ref 39.0–52.0)
Hemoglobin: 8.2 g/dL — ABNORMAL LOW (ref 13.0–17.0)

## 2023-02-15 LAB — CBC
HCT: 23.9 % — ABNORMAL LOW (ref 39.0–52.0)
Hemoglobin: 7.9 g/dL — ABNORMAL LOW (ref 13.0–17.0)
MCH: 28.2 pg (ref 26.0–34.0)
MCHC: 33.1 g/dL (ref 30.0–36.0)
MCV: 85.4 fL (ref 80.0–100.0)
Platelets: 249 10*3/uL (ref 150–400)
RBC: 2.8 MIL/uL — ABNORMAL LOW (ref 4.22–5.81)
RDW: 17.2 % — ABNORMAL HIGH (ref 11.5–15.5)
WBC: 10.7 10*3/uL — ABNORMAL HIGH (ref 4.0–10.5)
nRBC: 0 % (ref 0.0–0.2)

## 2023-02-15 LAB — MRSA NEXT GEN BY PCR, NASAL: MRSA by PCR Next Gen: NOT DETECTED

## 2023-02-15 SURGERY — ESOPHAGOGASTRODUODENOSCOPY (EGD) WITH PROPOFOL
Anesthesia: Monitor Anesthesia Care

## 2023-02-15 SURGERY — ESOPHAGOGASTRODUODENOSCOPY (EGD) WITH PROPOFOL
Anesthesia: General

## 2023-02-15 MED ORDER — LIDOCAINE HCL (PF) 2 % IJ SOLN
INTRAMUSCULAR | Status: AC
  Filled 2023-02-15: qty 5

## 2023-02-15 MED ORDER — EPHEDRINE SULFATE (PRESSORS) 50 MG/ML IJ SOLN
INTRAMUSCULAR | Status: DC | PRN
Start: 2023-02-15 — End: 2023-02-15
  Administered 2023-02-15: 10 mg via INTRAVENOUS

## 2023-02-15 MED ORDER — PROPOFOL 500 MG/50ML IV EMUL
INTRAVENOUS | Status: AC
  Filled 2023-02-15: qty 50

## 2023-02-15 MED ORDER — EPHEDRINE 5 MG/ML INJ
INTRAVENOUS | Status: AC
  Filled 2023-02-15: qty 5

## 2023-02-15 MED ORDER — SUCRALFATE 1 GM/10ML PO SUSP
1.0000 g | Freq: Three times a day (TID) | ORAL | Status: DC
  Administered 2023-02-15 – 2023-02-18 (×12): 1 g via ORAL
  Filled 2023-02-15 (×13): qty 10

## 2023-02-15 MED ORDER — PHENYLEPHRINE 80 MCG/ML (10ML) SYRINGE FOR IV PUSH (FOR BLOOD PRESSURE SUPPORT)
PREFILLED_SYRINGE | INTRAVENOUS | Status: AC
  Filled 2023-02-15: qty 10

## 2023-02-15 MED ORDER — GLUCAGON HCL RDNA (DIAGNOSTIC) 1 MG IJ SOLR
INTRAMUSCULAR | Status: DC | PRN
Start: 2023-02-15 — End: 2023-02-15
  Administered 2023-02-15: .25 mg via INTRAVENOUS

## 2023-02-15 MED ORDER — PROPOFOL 10 MG/ML IV BOLUS
INTRAVENOUS | Status: DC | PRN
  Administered 2023-02-15: 70 mg via INTRAVENOUS

## 2023-02-15 MED ORDER — SODIUM CHLORIDE 0.9 % IV SOLN
1.0000 g | Freq: Four times a day (QID) | INTRAVENOUS | Status: DC
  Administered 2023-02-15 – 2023-02-18 (×14): 1 g via INTRAVENOUS
  Filled 2023-02-15 (×2): qty 1000
  Filled 2023-02-15: qty 1
  Filled 2023-02-15 (×15): qty 1000

## 2023-02-15 MED ORDER — PROPOFOL 10 MG/ML IV BOLUS: INTRAVENOUS | Status: DC | PRN

## 2023-02-15 MED ORDER — LACTATED RINGERS IV SOLN: INTRAVENOUS | Status: DC

## 2023-02-15 MED ORDER — SODIUM CHLORIDE (PF) 0.9 % IJ SOLN
PREFILLED_SYRINGE | INTRAMUSCULAR | Status: DC | PRN
  Administered 2023-02-15: 3 mL

## 2023-02-15 MED ORDER — LIDOCAINE HCL (CARDIAC) PF 100 MG/5ML IV SOSY
PREFILLED_SYRINGE | INTRAVENOUS | Status: DC | PRN
Start: 2023-02-15 — End: 2023-02-15
  Administered 2023-02-15: 60 mg via INTRATRACHEAL

## 2023-02-15 MED ORDER — LACTATED RINGERS IV SOLN: INTRAVENOUS | Status: DC | PRN

## 2023-02-15 MED ORDER — PROPOFOL 500 MG/50ML IV EMUL
INTRAVENOUS | Status: DC | PRN
  Administered 2023-02-15: 150 ug/kg/min via INTRAVENOUS

## 2023-02-15 MED ORDER — LIDOCAINE HCL (PF) 2 % IJ SOLN
INTRAMUSCULAR | Status: AC
  Filled 2023-02-15: qty 15

## 2023-02-15 MED ORDER — PHENYLEPHRINE HCL (PRESSORS) 10 MG/ML IV SOLN
INTRAVENOUS | Status: DC | PRN
Start: 2023-02-15 — End: 2023-02-15
  Administered 2023-02-15: 160 ug via INTRAVENOUS
  Administered 2023-02-15 (×2): 240 ug via INTRAVENOUS
  Administered 2023-02-15: 160 ug via INTRAVENOUS

## 2023-02-15 MED ORDER — PANTOPRAZOLE SODIUM 40 MG IV SOLR
INTRAVENOUS | Status: AC
  Filled 2023-02-15: qty 20

## 2023-02-15 MED ORDER — EPINEPHRINE 1 MG/10ML IJ SOSY
PREFILLED_SYRINGE | INTRAMUSCULAR | Status: AC
  Filled 2023-02-15: qty 10

## 2023-02-15 NOTE — Brief Op Note (Addendum)
02/15/2023  2:51 PM  PATIENT:  Cole Brown  77 y.o. male  PRE-OPERATIVE DIAGNOSIS:  anemia and CT concerning for colovesical fistula  POST-OPERATIVE DIAGNOSIS:  EGD: gastric ulcers x2; duodenal ulcer; epinephrine 3mL in duodenum; gastric biopsies; 1cm hiatal hernia; ultra clip x1 in duodenum COLON: hemorrhoids  PROCEDURE:  Procedure(s): ESOPHAGOGASTRODUODENOSCOPY (EGD) WITH PROPOFOL (N/A) FELXIBLE SIGMOIDOSCOPY WITH PROPOFOL (N/A) HOT HEMOSTASIS (ARGON PLASMA COAGULATION/BICAP) SCLEROTHERAPY BIOPSY  SURGEON:  Surgeons and Role:    * Dolores Frame, MD - Primary  Patient underwent EGD and colonoscopy under propofol sedation.  Tolerated the procedure adequately.   EGD FINDINGS: - 1 cm hiatal hernia.  - Red blood in the gastric body.  Fluid aspiration performed.  - Non-bleeding gastric ulcers with a clean ulcer base (Forrest Class III).  Biopsied.  - Non-obstructing spurting duodenal ulcer with spurting hemorrhage (Forrest Class Ia).  Injected.  Treated with bipolar cautery.  Clip was placed.  Clip manufacturer: [employer].  - Non-bleeding duodenal ulcer with a clean ulcer base (Forrest Class III).   COLONOSCOPY FINDINGS: - Preparation of the colon was inadequate.  - Stool (melena) in the rectum and in the sigmoid colon.  - No specimens collected.   RECOMMENDATIONS - Return patient to ICU for ongoing care.  - NPO today. May advance to clear liquids in AM if hemoglobin is stable. - H/H every 12 hours, transfuse if Hb less than 7. - PPI drip for 72 hours. - No high dose aspirin, ibuprofen, naproxen, or other non-steroidal anti-inflammatory drugs indefinitely. -If patient presents recurrent clinical bleeding or hemodynamic instability, perform CT Angio bleed protocol STAT - consult IR if acrtive bleeding is identified. - Await pathology results.  - Proceed with repeat colonsocopy once hemodinamically stable - may consider outpatient colonsocopy in short  interval.  [name], MD Gastroenterology and Hepatology [facility]

## 2023-02-15 NOTE — Telephone Encounter (Signed)
Cole Brown - patient has an appointment with Dr. Tasia Catchings on 9/3, please push this out for 2 weeks as hospital follow up.   Brooke Bonito, MSN, APRN, FNP-BC, AGACNP-BC [facility] at [facility]

## 2023-02-15 NOTE — Op Note (Addendum)
[facility] Patient Name: Cole Brown Procedure Date: 02/15/2023 1:39 PM MRN: [id number] Date of Birth: 05-Feb-1946 Attending MD: [name] , , [id number] CSN: [id number] Age: 78 Admit Type: Inpatient Procedure:                Flexible Sigmoidoscopy Indications:              Melena, Abnormal CT of the GI tract Providers:                [name], Crystal Page, Elinor Parkinson,                            Dyann Ruddle Referring MD:              Medicines:                Monitored Anesthesia Care Complications:            No immediate complications. Estimated Blood Loss:     Estimated blood loss: none. Procedure:                Pre-Anesthesia Assessment:                           - Prior to the procedure, a History and Physical                            was performed, and patient medications, allergies                            and sensitivities were reviewed. The patient's                            tolerance of previous anesthesia was reviewed.                           - The risks and benefits of the procedure and the                            sedation options and risks were discussed with the                            patient. All questions were answered and informed                            consent was obtained.                           - ASA Grade Assessment: IV - A patient with severe                            systemic disease that is a constant threat to life.                           After obtaining informed consent, the scope was  passed under direct vision. The PCF-HQ190L                            ([id number]) scope was introduced through the anus and                            advanced to the the sigmoid colon. After obtaining                            informed consent, the scope was passed under direct                            vision.The flexible sigmoidoscopy was accomplished                            without  difficulty. The patient tolerated the                            procedure well. The quality of the bowel                            preparation was inadequate. Scope In: 2:43:31 PM Scope Out: 2:47:40 PM Total Procedure Duration: 0 hours 4 minutes 9 seconds  Findings:      The perianal and digital rectal examinations were normal.      A moderate amount of stool was found in the rectum and in the sigmoid       colon, interfering with visualization. Stool appeared to be melena but       no fresh blood was found. Impression:               - Preparation of the colon was inadequate.                           - Stool in the rectum and in the sigmoid colon.                           - No specimens collected. Moderate Sedation:      Per Anesthesia Care Recommendation:           - Return patient to hospital ward for ongoing care.                           - Proceed with repeat colonsocopy once                            hemodinamically stable - may consider outpatient                            colonsocopy in short interval. Procedure Code(s):        --- Professional ---                           [phone], Sigmoidoscopy, flexible; diagnostic,  including collection of specimen(s) by brushing or                            washing, when performed (separate procedure) Diagnosis Code(s):        --- Professional ---                           K92.1, Melena (includes Hematochezia)                           R93.3, Abnormal findings on diagnostic imaging of                            other parts of digestive tract CPT copyright 2022 [employer]. All rights reserved. The codes documented in this report are preliminary and upon coder review may  be revised to meet current compliance requirements. [name], MD [name],  02/15/2023 2:58:13 PM This report has been signed electronically. Number of Addenda: 0

## 2023-02-15 NOTE — Progress Notes (Signed)
Progress Note   Patient: Cole Brown GMW:[id number] DOB: 19-Apr-1946 DOA: 02/12/2023     3 DOS: the patient was seen and examined on 02/15/2023   Brief hospital admission narrative: As per H&P written by Dr. [name] on 02/12/2023 Cole Brown is a 77 y.o. male with medical history significant for hypertension, inflammatory arthritis, fibromyalgia with [name] phenomenon, and recent diverticulitis who presented to the ED with progressive fatigue and confusion as well as some dizziness and lightheadedness that acutely worsened approximately 3 days ago.  This appears to be worsened with standing.  He appears to deny any abdominal pain, dark stools, or bloody stools.  He has had some increased pain and has been taking high doses of Tylenol up to eight 500 mg tablets per day.  Wife also reports a change in his personality with some "brain fog."   ED Course: Vital signs stable and orthostatic vitals currently pending.  1 unit PRBC ordered and pending.  EDP discussed with GI who recommends hospitalization for endoscopy upper and lower.  Urinalysis concerning for UTI.  He has been started on Protonix infusion.  Assessment and Plan: 1-acute blood loss anemia -Nonspecified gastro intestinal bleeding as patient's source of decreased hemoglobin -Overnight with multiple episodes of bloody emesis and inability to tolerate bowel prep.  Hemoglobin down to 6.8. - a total of 4 units PRBCs has been provided to stabilize hemoglobin level. -GI service on board and will follow recommendations. -Continue IV PPI, clear liquid diet and as needed antiemetics. -Remains n.p.o. with plans for endoscopic evaluation and sigmoidoscopy later today.  2-metabolic encephalopathy -At transfusion and fluid resuscitation patient's symptoms stabilized and mentation improved -Continue minimizing sedative agents -Will continue IV antibiotics for presumed UTI while following cultures -Continue to maintain adequate hydration and  follow hemoglobin trend with intention to further transfuse as needed for hemoglobin less than 7.5 -Given episode of severe hematemesis and hemoglobin of 6.8 and another unit of PRBCs has been transfused. -Patient has received a total of 4 units during this hospitalization. -Repeat hemoglobin 8.6  3-chronic hyponatremia -Stable and at baseline -TSH within normal limits -Continue to follow electrolytes. -Sodium level 128-129. -Asymptomatic neurologically speaking.  4-recent diverticulitis -CT scan of the abdomen demonstrating still presence of diverticulitis with concerns for colovesicular fistula -Patient will require outpatient follow-up with general surgery and urology service -Current antibiotic for UTI will also provide some coverage for residual diverticulitis.  5-Enterococcus UTI -Empirically started on Rocephin and antibiotics transition to ampicillin based on culture results. -Continue to maintain adequate hydration -Patient reporting no dysuria currently.  6-hypertension -Blood pressure stable -Continue current antihypertensive agents. -Continue to follow vital signs.  7-fibromyalgia with intermittent [name] phenomenon -Continue Tylenol and as needed analgesics using oxycodone per home regimen -Holding methotrexate at the moment. -Continue outpatient follow-up with rheumatology service.  8-moderate protein calorie malnutrition -Appreciate assistance and recommendation by nutritional service Continue feeding supplement -Patient advised to maintain adequate hydration.  Subjective:  Significant episode of hematemesis leading for patient to be transferred to ICU for closer observation and aggressive measurements to be taken if needed.  Patient denies chest pain, no fever, no nausea or vomiting currently.  Expressed no shortness of breath.  Stable blood pressure.  Hemoglobin 6.8  Physical Exam: Vitals:   02/15/23 1539 02/15/23 1600 02/15/23 1658 02/15/23 1700  BP:   132/62 132/62 137/82  Pulse:  70 70 71  Resp:  13 13 13   Temp: 98.1 F (36.7 C)     TempSrc: Oral  SpO2:  100% 100% 95%  Weight:      Height:       General exam: Alert, awake, oriented x 3; no chest pain and no shortness of breath.  Overnight experiencing significant event of bloody emesis with drop in his hemoglobin down to 6.8.  Patient was transferred to ICU Respiratory system: Clear to auscultation. Respiratory effort normal.  Good saturation on room air. Cardiovascular system:RRR. No rub or gallop; no JVD. Gastrointestinal system: Abdomen is nondistended, soft and nontender. No organomegaly or masses felt. Normal bowel sounds heard. Central nervous system: Alert and oriented. No focal neurological deficits. Extremities: No cyanosis or clubbing. Skin: No petechiae. Psychiatry: Flat affect appreciated on exam.  No suicidal ideation or hallucinations.  Data Reviewed: CBC: White blood cells 10.7, hemoglobin 7.9, platelet count of 49K Basic metabolic panel: Sodium 128, potassium 3.9, chloride 100, bicarb 20, BUN 41, creatinine 0.80 and GFR >60  Family Communication: No family at bedside  Disposition: Status is: Inpatient Remains inpatient appropriate because: Close monitoring of patient hemoglobin trend with further transfusion as needed; follow GI service recommendations/evaluation for endoscopic procedures.   Planned Discharge Destination: Home  Time spent: 45 minutes  Author: [name], MD 02/15/2023 5:25 PM  For on call review [email or web address].

## 2023-02-15 NOTE — Progress Notes (Signed)
We will proceed with EGD as scheduled. Initial plan for colonoscopy changed to flexible sigmoidoscopy as the patient has not been able to tolerate bowel prep yet.  I thoroughly discussed with the patient the procedure, including the risks involved. Patient understands what the procedure involves including the benefits and any risks. Patient understands alternatives to the proposed procedure. Risks including (but not limited to) bleeding, tearing of the lining (perforation), rupture of adjacent organs, problems with heart and lung function, infection, and medication reactions. A small percentage of complications may require surgery, hospitalization, repeat endoscopic procedure, and/or transfusion.  Patient understood and agreed.  [name], MD Gastroenterology and Hepatology [facility] Gastroenterology\

## 2023-02-15 NOTE — Care Management Important Message (Signed)
Important Message  Patient Details  Name: Cole Brown MRN: [id number] Date of Birth: Oct 20, 1945   Medicare Important Message Given:  Yes     [name] 02/15/2023, 4:05 PM

## 2023-02-15 NOTE — Plan of Care (Signed)
Patient EGD and colonoscopy done.  No bloody BM or vomiting today.  Bowel prep done for EGD

## 2023-02-15 NOTE — Op Note (Addendum)
[facility] Patient Name: Cole Brown Procedure Date: 02/15/2023 1:45 PM MRN: [id number] Date of Birth: 1946-02-11 Attending MD: [name] , , [id number] CSN: [id number] Age: 77 Admit Type: Inpatient Procedure:                Upper GI endoscopy Indications:              Iron deficiency anemia, Hematemesis, Melena Providers:                [name], Crystal Page, Dyann Ruddle,                            Elinor Parkinson Referring MD:              Medicines:                Monitored Anesthesia Care Complications:            No immediate complications. Estimated Blood Loss:     Estimated blood loss was minimal. Procedure:                Pre-Anesthesia Assessment:                           - Prior to the procedure, a History and Physical                            was performed, and patient medications, allergies                            and sensitivities were reviewed. The patient's                            tolerance of previous anesthesia was reviewed.                           - The risks and benefits of the procedure and the                            sedation options and risks were discussed with the                            patient. All questions were answered and informed                            consent was obtained.                           - ASA Grade Assessment: IV - A patient with severe                            systemic disease that is a constant threat to life.                           After obtaining informed consent, the endoscope was  passed under direct vision. Throughout the                            procedure, the patient's blood pressure, pulse, and                            oxygen saturations were monitored continuously. The                            [id number]-H190 ([id number]) scope was introduced through the                            mouth, and advanced to the second part of duodenum.                             The patient tolerated the procedure well. The upper                            GI endoscopy was performed with difficulty due to                            excessive bleeding. Scope In: 2:10:56 PM Scope Out: 2:36:35 PM Total Procedure Duration: 0 hours 25 minutes 39 seconds  Findings:      A 1 cm hiatal hernia was present.      Red blood was found in the gastric body. Fluid aspiration was performed.      Five non-bleeding cratered and superficial gastric ulcers with a clean       ulcer base (Forrest Class III) were found in the gastric antrum. The       largest lesion was 8 mm in largest dimension. Biopsies were taken with a       cold forceps for Helicobacter pylori testing.      One non-obstructing spurting cratered duodenal ulcer with spurting       hemorrhage (Forrest Class Ia) was found in the first portion of the       duodenum. The lesion was 25 mm in largest dimension. Area was       successfully injected with 3 mL of a 0.1 mg/mL solution of epinephrine       for hemostasis. Coagulation for hemostasis - pressure applied on top of       the vessel - using bipolar probe was successful. I initially attempted       to place an Ultra clip at the area of vessel but given scarred edges,       this was not possible. Due to this, I used one Mantis hemostatic clip,       which was successfully placed. Clip manufacturer: [employer].       There was no bleeding at the end of the procedure.      One non-bleeding cratered duodenal ulcer with a clean ulcer base       (Forrest Class III) was found in the duodenal bulb. The lesion was 10 mm       in largest dimension. Impression:               - 1 cm hiatal hernia.                           -  Red blood in the gastric body. Fluid aspiration                            performed.                           - Non-bleeding gastric ulcers with a clean ulcer                            base (Forrest Class III). Biopsied.                            - Non-obstructing spurting duodenal ulcer with                            spurting hemorrhage (Forrest Class Ia). Injected.                            Treated with bipolar cautery. Clip was placed. Clip                            manufacturer: [employer].                           - Non-bleeding duodenal ulcer with a clean ulcer                            base (Forrest Class III). Moderate Sedation:      Per Anesthesia Care Recommendation:           - Return patient to ICU for ongoing care.                           - NPO today. May advance to clear liquids in AM if                            hemoglobin is stable.                           - H/H every 12 hours, transfuse if Hb less than 7.                           - PPI drip for 72 hours.                           - No high dose aspirin, ibuprofen, naproxen, or                            other non-steroidal anti-inflammatory drugs                            indefinitely.                           - Await pathology results.                           -  If patient presents recurrent clinical bleeding                            or hemodynamic instability, perform CT Angio bleed                            protocol STAT - consult IR if acrtive bleeding is                            identified. Procedure Code(s):        --- Professional ---                           [phone], 59, Esophagogastroduodenoscopy, flexible,                            transoral; with control of bleeding, any method                           43239, Esophagogastroduodenoscopy, flexible,                            transoral; with biopsy, single or multiple Diagnosis Code(s):        --- Professional ---                           K44.9, Diaphragmatic hernia without obstruction or                            gangrene                           K92.2, Gastrointestinal hemorrhage, unspecified                           K25.9, Gastric ulcer, unspecified as acute or                             chronic, without hemorrhage or perforation                           K26.4, Chronic or unspecified duodenal ulcer with                            hemorrhage                           D50.9, Iron deficiency anemia, unspecified                           K92.0, Hematemesis                           K92.1, Melena (includes Hematochezia) CPT copyright 2022 American Medical Association. All rights reserved. The codes documented in this report are preliminary and upon coder review may  be revised to meet current compliance requirements. [name], MD [name],  02/15/2023 2:55:32 PM This  report has been signed electronically. Number of Addenda: 0

## 2023-02-15 NOTE — Anesthesia Postprocedure Evaluation (Signed)
Anesthesia Post Note  Patient: Cole Brown  Procedure(s) Performed: ESOPHAGOGASTRODUODENOSCOPY (EGD) WITH PROPOFOL COLONOSCOPY WITH PROPOFOL HOT HEMOSTASIS (ARGON PLASMA COAGULATION/BICAP) SCLEROTHERAPY BIOPSY  Patient location during evaluation: Phase II Anesthesia Type: General Level of consciousness: awake Pain management: pain level controlled Vital Signs Assessment: post-procedure vital signs reviewed and stable Respiratory status: spontaneous breathing and respiratory function stable Cardiovascular status: blood pressure returned to baseline and stable Postop Assessment: no headache and no apparent nausea or vomiting Anesthetic complications: no Comments: Late entry   No notable events documented.   Last Vitals:  Vitals:   02/15/23 1526 02/15/23 1539  BP: (!) 114/50   Pulse: 75   Resp: 19   Temp:  36.7 C  SpO2: 100%     Last Pain:  Vitals:   02/15/23 1539  TempSrc: Oral  PainSc:                  Windell [place]

## 2023-02-15 NOTE — Transfer of Care (Signed)
Immediate Anesthesia Transfer of Care Note  Patient: Cole Brown  Procedure(s) Performed: ESOPHAGOGASTRODUODENOSCOPY (EGD) WITH PROPOFOL COLONOSCOPY WITH PROPOFOL HOT HEMOSTASIS (ARGON PLASMA COAGULATION/BICAP) SCLEROTHERAPY BIOPSY  Patient Location: PACU  Anesthesia Type:General  Level of Consciousness: drowsy, patient cooperative, and responds to stimulation  Airway & Oxygen Therapy: Patient Spontanous Breathing and Patient connected to nasal cannula oxygen  Post-op Assessment: Report given to RN and Post -op Vital signs reviewed and stable  Post vital signs: Reviewed and stable  Last Vitals:  Vitals Value Taken Time  BP 93/43 02/15/23 1456  Temp 36.5 C 02/15/23 1456  Pulse 73 02/15/23 1456  Resp 14 02/15/23 1456  SpO2 97 % 02/15/23 1456    Last Pain:  Vitals:   02/15/23 1404  TempSrc:   PainSc: 6          Complications: No notable events documented.

## 2023-02-15 NOTE — Anesthesia Preprocedure Evaluation (Signed)
Anesthesia Evaluation  Patient identified by MRN, date of birth, ID band Patient awake    Reviewed: Allergy & Precautions, H&P , NPO status , Patient's Chart, lab work & pertinent test results, reviewed documented beta blocker date and time   Airway Mallampati: II  TM Distance: >3 FB Neck ROM: full    Dental no notable dental hx.    Pulmonary neg pulmonary ROS   Pulmonary exam normal breath sounds clear to auscultation       Cardiovascular Exercise Tolerance: Good hypertension, negative cardio ROS  Rhythm:regular Rate:Normal     Neuro/Psych  Neuromuscular disease negative neurological ROS  negative psych ROS   GI/Hepatic negative GI ROS, Neg liver ROS,,,  Endo/Other  negative endocrine ROS    Renal/GU negative Renal ROS  negative genitourinary   Musculoskeletal   Abdominal   Peds  Hematology negative hematology ROS (+) Blood dyscrasia, anemia   Anesthesia Other Findings   Reproductive/Obstetrics negative OB ROS                             Anesthesia Physical Anesthesia Plan  ASA: 4 and emergent  Anesthesia Plan: General   Post-op Pain Management:    Induction:   PONV Risk Score and Plan: Propofol infusion  Airway Management Planned:   Additional Equipment:   Intra-op Plan:   Post-operative Plan:   Informed Consent: I have reviewed the patients History and Physical, chart, labs and discussed the procedure including the risks, benefits and alternatives for the proposed anesthesia with the patient or authorized representative who has indicated his/her understanding and acceptance.     Dental Advisory Given  Plan Discussed with: CRNA  Anesthesia Plan Comments:        Anesthesia Quick Evaluation

## 2023-02-16 DIAGNOSIS — M797 Fibromyalgia: Secondary | ICD-10-CM | POA: Diagnosis not present

## 2023-02-16 DIAGNOSIS — K269 Duodenal ulcer, unspecified as acute or chronic, without hemorrhage or perforation: Secondary | ICD-10-CM

## 2023-02-16 DIAGNOSIS — N321 Vesicointestinal fistula: Secondary | ICD-10-CM | POA: Diagnosis not present

## 2023-02-16 DIAGNOSIS — D62 Acute posthemorrhagic anemia: Secondary | ICD-10-CM | POA: Diagnosis not present

## 2023-02-16 DIAGNOSIS — K264 Chronic or unspecified duodenal ulcer with hemorrhage: Secondary | ICD-10-CM | POA: Diagnosis not present

## 2023-02-16 DIAGNOSIS — I10 Essential (primary) hypertension: Secondary | ICD-10-CM | POA: Diagnosis not present

## 2023-02-16 DIAGNOSIS — K921 Melena: Secondary | ICD-10-CM | POA: Diagnosis not present

## 2023-02-16 DIAGNOSIS — K5792 Diverticulitis of intestine, part unspecified, without perforation or abscess without bleeding: Secondary | ICD-10-CM | POA: Diagnosis not present

## 2023-02-16 LAB — BPAM RBC
Blood Product Expiration Date: 202409262359
Blood Product Expiration Date: 202409262359
Blood Product Expiration Date: 202410012359
Blood Product Expiration Date: 202410012359
ISSUE DATE / TIME: 202408271623
ISSUE DATE / TIME: 202408290928
ISSUE DATE / TIME: 202408291809
ISSUE DATE / TIME: 202408300427
Unit Type and Rh: 5100
Unit Type and Rh: 5100
Unit Type and Rh: 5100
Unit Type and Rh: 5100

## 2023-02-16 LAB — PREPARE RBC (CROSSMATCH)

## 2023-02-16 LAB — TYPE AND SCREEN
ABO/RH(D): O POS
Antibody Screen: NEGATIVE
Unit division: 0
Unit division: 0
Unit division: 0
Unit division: 0

## 2023-02-16 LAB — CBC
HCT: 22.5 % — ABNORMAL LOW (ref 39.0–52.0)
Hemoglobin: 7.3 g/dL — ABNORMAL LOW (ref 13.0–17.0)
MCH: 28.6 pg (ref 26.0–34.0)
MCHC: 32.4 g/dL (ref 30.0–36.0)
MCV: 88.2 fL (ref 80.0–100.0)
Platelets: 181 10*3/uL (ref 150–400)
RBC: 2.55 MIL/uL — ABNORMAL LOW (ref 4.22–5.81)
RDW: 17.6 % — ABNORMAL HIGH (ref 11.5–15.5)
WBC: 7.6 10*3/uL (ref 4.0–10.5)
nRBC: 0 % (ref 0.0–0.2)

## 2023-02-16 MED ORDER — SODIUM CHLORIDE 0.9% IV SOLUTION: Freq: Once | INTRAVENOUS | Status: AC

## 2023-02-16 MED ORDER — PANTOPRAZOLE 80MG IVPB - SIMPLE MED
80.0000 mg | Freq: Once | INTRAVENOUS | Status: AC
  Administered 2023-02-16: 80 mg via INTRAVENOUS
  Filled 2023-02-16: qty 80

## 2023-02-16 MED ORDER — PANTOPRAZOLE SODIUM 40 MG IV SOLR: 40.0000 mg | Freq: Two times a day (BID) | INTRAVENOUS | Status: DC

## 2023-02-16 MED ORDER — PANTOPRAZOLE INFUSION (NEW) - SIMPLE MED
8.0000 mg/h | INTRAVENOUS | Status: DC
  Administered 2023-02-16 – 2023-02-17 (×2): 8 mg/h via INTRAVENOUS
  Filled 2023-02-16: qty 80
  Filled 2023-02-16 (×4): qty 100
  Filled 2023-02-16: qty 80

## 2023-02-16 NOTE — Progress Notes (Signed)
Progress Note   Patient: Cole Brown:[id number] DOB: 24-May-1946 DOA: 02/12/2023     4 DOS: the patient was seen and examined on 02/16/2023   Brief hospital admission narrative: As per H&P written by Dr. [name] on 02/12/2023 Cole Brown is a 77 y.o. male with medical history significant for hypertension, inflammatory arthritis, fibromyalgia with [name] phenomenon, and recent diverticulitis who presented to the ED with progressive fatigue and confusion as well as some dizziness and lightheadedness that acutely worsened approximately 3 days ago.  This appears to be worsened with standing.  He appears to deny any abdominal pain, dark stools, or bloody stools.  He has had some increased pain and has been taking high doses of Tylenol up to eight 500 mg tablets per day.  Wife also reports a change in his personality with some "brain fog."   ED Course: Vital signs stable and orthostatic vitals currently pending.  1 unit PRBC ordered and pending.  EDP discussed with GI who recommends hospitalization for endoscopy upper and lower.  Urinalysis concerning for UTI.  He has been started on Protonix infusion.  Assessment and Plan: 1-acute blood loss anemia -Nonspecified gastro intestinal bleeding as patient's source of decreased hemoglobin -no nausea, no vomiting, no abd pain. - a total of 5 units PRBCs would have been provided to stabilize hemoglobin level; patient will received 5th unit today.. -GI service on board and will follow recommendations. -Continue IV PPI, clear liquid diet and as needed antiemetics. -S/p endoscopic procedures on 02/15/23; multiple ulcers seen in his stomach and duodenum. Treatment with cautery and clip provided. Continue IV PPI for another 48 hours. -avoid NSAID's -diet advancements as per GI rec's.  2-metabolic encephalopathy -At transfusion and fluid resuscitation patient's symptoms stabilized and mentation improved -Continue minimizing sedative agents -Will continue  tx with antibiotics for UTI. -Continue to maintain adequate hydration and follow hemoglobin trend with intention to further transfuse as needed for hemoglobin less than 7.5 -Given episode of severe hematemesis and hemoglobin of 6.8 and another unit of PRBCs has been transfused. -Patient would have received a total of 5 units during this hospitalization. -Repeat hemoglobin this morning 7.3  3-chronic hyponatremia -Stable and at baseline -TSH within normal limits -Continue to follow electrolytes trend. -Sodium level 128-129. -Asymptomatic neurologically speaking.  4-recent diverticulitis -CT scan of the abdomen demonstrating still presence of diverticulitis with concerns for colovesicular fistula -Patient will require outpatient follow-up with general surgery and urology service -Current antibiotic for UTI will also provide some coverage for residual diverticulitis.  5-Enterococcus UTI -Empirically started on Rocephin and antibiotics transition to ampicillin based on culture results. -Continue to maintain adequate hydration -Patient reporting no dysuria currently. -as mentioned above, will benefit of follow up with urology and general surgery.  6-hypertension -Blood pressure stable -Continue current antihypertensive agents. -Continue to follow vital signs. -heart healthy diet discussed with patient.  7-fibromyalgia with intermittent [name] phenomenon -Continue Tylenol and as needed analgesics using oxycodone per home regimen -continue Holding methotrexate at the moment. -Continue outpatient follow-up with rheumatology, neurology and pain specialist service.  8-moderate protein calorie malnutrition -Appreciate assistance and recommendation by nutritional service -Continue feeding supplement -Patient advised to maintain adequate oral hydration.  Subjective:  S/P EGD and colonoscopy; multiple ulcers seen in the stomach and duodenum, last one treated with cautery and clip. No  overt bleeding reported.  Physical Exam: Vitals:   02/16/23 0500 02/16/23 0517 02/16/23 0600 02/16/23 0811  BP: 118/63  134/66   Pulse: 69  70  Resp: 14  11   Temp:  98.1 F (36.7 C)  98 F (36.7 C)  TempSrc:  Oral  Oral  SpO2: 99%  100%   Weight:  74.9 kg    Height:       General exam: Alert, awake, following commands appropriately and expressing not nausea or vomiting. CLD initiated and tolerated. Respiratory system: Clear to auscultation. Respiratory effort normal. Good saturation on RA Cardiovascular system:RRR. No rubs or gallops.no JVD Gastrointestinal system: Abdomen is nondistended, soft and nontender. No organomegaly or masses felt. Normal bowel sounds heard. Central nervous system: No focal neurological deficits. Extremities: No cyanosis or clubbing, trace edema appreciated. Skin: No petechiae. Psychiatry: flat affect. Following commands and denying hallucinations.  Data Reviewed: CBC: White blood cells 7.6, Hgb 7.3 and platelets 181K Basic metabolic panel: Sodium 128, potassium 3.9, chloride 100, bicarb 20, BUN 41, creatinine 0.80 and GFR >60  Family Communication: No family at bedside  Disposition: Status is: Inpatient Remains inpatient appropriate because: Close monitoring of patient hemoglobin trend with further transfusion as needed; follow GI service recommendations/evaluation for endoscopic procedures.   Planned Discharge Destination: Home  Time spent: 45 minutes  Author: [name], MD 02/16/2023 9:36 AM  For on call review [email or web address].

## 2023-02-16 NOTE — Plan of Care (Signed)

## 2023-02-16 NOTE — Progress Notes (Signed)
Cole Brown, M.D. Gastroenterology & Hepatology   Interval History: Patient reports feeling tired and is still slightly lightheaded.  No reported melena today or any more episodes of hematemesis.  Hemoglobin decreased to 7.3, ordered to have a repeat in the PRBC today.  Inpatient Medications:  Current Facility-Administered Medications:    (feeding supplement) PROSource Plus liquid 30 mL, 30 mL, Oral, TID BM, Cole Loll, MD, 30 mL at 02/16/23 0838   0.9 %  sodium chloride infusion, , Intravenous, PRN, Cole Loll, MD, Stopped at 02/14/23 1739   acetaminophen (TYLENOL) tablet 1,000 mg, 1,000 mg, Oral, Q6H, 1,000 mg at 02/16/23 0839 **OR** acetaminophen (TYLENOL) suppository 650 mg, 650 mg, Rectal, Q6H, Cole Brown, [name], DO   amLODipine (NORVASC) tablet 5 mg, 5 mg, Oral, q1800, Cole Brown, [name], DO, 5 mg at 02/15/23 1658   ampicillin (OMNIPEN) 1 g in sodium chloride 0.9 % 100 mL IVPB, 1 g, Intravenous, Q6H, Cole Loll, MD, Last Rate: 300 mL/hr at 02/16/23 0902, 1 g at 02/16/23 0902   atenolol (TENORMIN) tablet 12.5 mg, 12.5 mg, Oral, Daily, Cole Brown, [name], DO, 12.5 mg at 02/16/23 6295   Chlorhexidine Gluconate Cloth 2 % PADS 6 each, 6 each, Topical, Q0600, Zierle-Ghosh, [name], DO, 6 each at 02/16/23 0549   feeding supplement (BOOST / RESOURCE BREEZE) liquid 1 Container, 1 Container, Oral, TID BM, Cole Brown, [name], DO, 1 Container at 02/16/23 0847   FLUoxetine (PROZAC) capsule 10 mg, 10 mg, Oral, Daily, Cole Brown, [name], DO, 10 mg at 02/16/23 0839   fluticasone (FLONASE) 50 MCG/ACT nasal spray 1-2 spray, 1-2 spray, Each Nare, Daily, Cole Brown, [name], DO, 2 spray at 02/16/23 0852   loratadine (CLARITIN) tablet 10 mg, 10 mg, Oral, Daily, Cole Brown, [name], DO, 10 mg at 02/16/23 0838   montelukast (SINGULAIR) tablet 10 mg, 10 mg, Oral, QHS, Cole Brown, [name], DO, 10 mg at 02/15/23 2143   multivitamin with minerals tablet 1 tablet, 1 tablet, Oral, Daily, Cole Loll, MD, 1 tablet at 02/16/23 0838    ondansetron (ZOFRAN) tablet 4 mg, 4 mg, Oral, Q6H PRN **OR** ondansetron (ZOFRAN) injection 4 mg, 4 mg, Intravenous, Q6H PRN, Cole Brown, [name], DO, 4 mg at 02/15/23 1339   oxyCODONE (Oxy IR/ROXICODONE) immediate release tablet 5 mg, 5 mg, Oral, Q4H PRN, Cole Brown, [name], DO   pantoprazole (PROTONIX) 80 mg /NS 100 mL IVPB, 80 mg, Intravenous, Once, Cole Brown, [name], MD   Cole Brown ON 02/19/2023] pantoprazole (PROTONIX) injection 40 mg, 40 mg, Intravenous, Q12H, Cole Brown, Cole Eubanks, MD   pantoprozole (PROTONIX) 80 mg /NS 100 mL infusion, 8 mg/hr, Intravenous, Continuous, Cole Brown, [name], MD   sucralfate (CARAFATE) 1 GM/10ML suspension 1 g, 1 g, Oral, TID WC & HS, Cole Loll, MD, 1 g at 02/16/23 2841   I/O    Intake/Output Summary (Last 24 hours) at 02/16/2023 1055 Last data filed at 02/16/2023 0920 Gross per 24 hour  Intake 1274.71 ml  Output 1250 ml  Net 24.71 ml     Physical Exam: Temp:  [97.7 F (36.5 C)-98.4 F (36.9 C)] 98.3 F (36.8 C) (08/31 1015) Pulse Rate:  [66-75] 70 (08/31 0600) Resp:  [10-23] 11 (08/31 0600) BP: (93-140)/(43-82) 119/56 (08/31 1015) SpO2:  [95 %-100 %] 100 % (08/31 0600) Weight:  [74.9 kg] 74.9 kg (08/31 0517)  Temp (24hrs), Avg:98 F (36.7 C), Min:97.7 F (36.5 C), Max:98.4 F (36.9 C) GENERAL: The patient is AO x3, in mild distress. Looks mildly pale.  HEENT: Head is  normocephalic and atraumatic. EOMI are intact. Mouth is well hydrated and without lesions. NECK: Supple. No masses LUNGS: Clear to auscultation. No presence of rhonchi/wheezing/rales. Adequate chest expansion HEART: RRR, normal s1 and s2. ABDOMEN: Soft, nontender, no guarding, no peritoneal signs, and nondistended. BS +. No masses. EXTREMITIES: Without any cyanosis, clubbing, rash, lesions or edema. NEUROLOGIC: AOx3, no focal motor deficit. SKIN: no jaundice, no rashes  Laboratory Data: CBC:     Component Value Date/Time   WBC 7.6 02/16/2023 0336   RBC 2.55 (L)  02/16/2023 0336   HGB 7.3 (L) 02/16/2023 0336   HGB 12.0 (L) 10/09/2021 1455   HCT 22.5 (L) 02/16/2023 0336   HCT 36.5 (L) 10/09/2021 1455   PLT 181 02/16/2023 0336   PLT 229 10/09/2021 1455   MCV 88.2 02/16/2023 0336   MCV 88 10/09/2021 1455   MCH 28.6 02/16/2023 0336   MCHC 32.4 02/16/2023 0336   RDW 17.6 (H) 02/16/2023 0336   RDW 16.4 (H) 10/09/2021 1455   LYMPHSABS 1.0 02/12/2023 1129   LYMPHSABS 1.4 10/09/2021 1455   MONOABS 0.4 02/12/2023 1129   EOSABS 0.1 02/12/2023 1129   EOSABS 0.3 10/09/2021 1455   BASOSABS 0.1 02/12/2023 1129   BASOSABS 0.1 10/09/2021 1455   COAG:  Lab Results  Component Value Date   INR 1.2 01/30/2023   INR 1.19 11/15/2016    BMP:     Latest Ref Rng & Units 02/15/2023    3:15 AM 02/14/2023    5:17 AM 02/13/2023    4:58 AM  BMP  Glucose 70 - 99 mg/dL 161  99  86   BUN 8 - 23 mg/dL 41  32  16   Creatinine 0.61 - 1.24 mg/dL 0.96  0.45  4.09   Sodium 135 - 145 mmol/L 128  129  129   Potassium 3.5 - 5.1 mmol/L 3.9  4.0  3.6   Chloride 98 - 111 mmol/L 100  100  101   CO2 22 - 32 mmol/L 20  21  19    Calcium 8.9 - 10.3 mg/dL 7.5  7.6  7.5     HEPATIC:     Latest Ref Rng & Units 02/13/2023    4:58 AM 02/12/2023   11:29 AM 01/30/2023    7:45 PM  Hepatic Function  Total Protein 6.5 - 8.1 g/dL 5.4  6.0  6.3   Albumin 3.5 - 5.0 g/dL 2.0  2.3  2.6   AST 15 - 41 U/L 20  26  22    ALT 0 - 44 U/L 21  25  25    Alk Phosphatase 38 - 126 U/L 139  153  107   Total Bilirubin 0.3 - 1.2 mg/dL 0.6  0.6  0.8   Bilirubin, Direct 0.0 - 0.2 mg/dL   0.3     CARDIAC:  Lab Results  Component Value Date   CKTOTAL 92 01/30/2023      Imaging: I personally reviewed and interpreted the available labs, imaging and endoscopic files.   Assessment/Plan: Cole Brown is a 77 y.o. 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  His hospitalization was complicated by episodes of hematemesis and persistent nausea which melena while attempting to take bowel prep (he was not able to tolerate the prep).  He presented worsening anemia after bleeding episodes, for which  he underwent an EGD and a flexible sigmoidoscopy yesterday.  EGD was remarkable for the presence of blood in the stomach, as well as multiple small gastric ulcers, there was presence of large duodenal ulcerations with largest measuring 20-30 mm and with presence of spurting vessel which required injection of adrenaline, ablation and clipping x1.  flexible sigmoidoscopy was difficult to perform given amount of stool.  Patient is somewhat stable although his hemoglobin has trended down, he will benefit from receiving 1 unit of blood.  He will need to be restarted on his pantoprazole drip and will need to continue Carafate 1 g every 6 hours.  Will need to continue pantoprazole drip for 72 hours.  May advance his diet to clear liquid diet today.  I emphasized the importance of avoiding NSAID use.  -Advance to clear liquids  - H/ H every day, transfuse one unit now.  - PPI drip for 72 hours. - Sucralfate 1 every 6 hours - No high dose aspirin, ibuprofen, naproxen, or other non- steroidal anti- inflammatory drugs indefinitely. - Await pathology results.  - If patient presents recurrent clinical bleeding or hemodynamic instability, perform CT Angio bleed protocol STAT - consult IR if active bleeding is identified. - Will need outpatient evaluation of colovesical fistula with colonoscopy, general surgery and urology at [facility], MD Gastroenterology and Hepatology [facility] Gastroenterology

## 2023-02-16 NOTE — Plan of Care (Signed)
  Problem: Coping: Goal: Level of anxiety will decrease Outcome: Not Progressing   Problem: Elimination: Goal: Will not experience complications related to urinary retention Outcome: Progressing   Problem: Safety: Goal: Ability to remain free from injury will improve Outcome: Progressing

## 2023-02-17 DIAGNOSIS — K269 Duodenal ulcer, unspecified as acute or chronic, without hemorrhage or perforation: Secondary | ICD-10-CM | POA: Diagnosis not present

## 2023-02-17 DIAGNOSIS — D62 Acute posthemorrhagic anemia: Secondary | ICD-10-CM | POA: Diagnosis not present

## 2023-02-17 DIAGNOSIS — N321 Vesicointestinal fistula: Secondary | ICD-10-CM | POA: Diagnosis not present

## 2023-02-17 DIAGNOSIS — K921 Melena: Secondary | ICD-10-CM | POA: Diagnosis not present

## 2023-02-17 DIAGNOSIS — K264 Chronic or unspecified duodenal ulcer with hemorrhage: Secondary | ICD-10-CM | POA: Diagnosis not present

## 2023-02-17 LAB — BASIC METABOLIC PANEL
Anion gap: 5 (ref 5–15)
BUN: 16 mg/dL (ref 8–23)
CO2: 22 mmol/L (ref 22–32)
Calcium: 7.3 mg/dL — ABNORMAL LOW (ref 8.9–10.3)
Chloride: 97 mmol/L — ABNORMAL LOW (ref 98–111)
Creatinine, Ser: 0.73 mg/dL (ref 0.61–1.24)
GFR, Estimated: >60 mL/min (ref >60)
Glucose, Bld: 109 mg/dL — ABNORMAL HIGH (ref 70–99)
Potassium: 3.5 mmol/L (ref 3.5–5.1)
Sodium: 124 mmol/L — ABNORMAL LOW (ref 135–145)

## 2023-02-17 LAB — CBC
HCT: 23.6 % — ABNORMAL LOW (ref 39.0–52.0)
Hemoglobin: 7.6 g/dL — ABNORMAL LOW (ref 13.0–17.0)
MCH: 29.1 pg (ref 26.0–34.0)
MCHC: 32.2 g/dL (ref 30.0–36.0)
MCV: 90.4 fL (ref 80.0–100.0)
Platelets: 167 10*3/uL (ref 150–400)
RBC: 2.61 MIL/uL — ABNORMAL LOW (ref 4.22–5.81)
RDW: 17.8 % — ABNORMAL HIGH (ref 11.5–15.5)
WBC: 8.6 10*3/uL (ref 4.0–10.5)
nRBC: 0 % (ref 0.0–0.2)

## 2023-02-17 LAB — TYPE AND SCREEN
ABO/RH(D): O POS
Antibody Screen: NEGATIVE
Unit division: 0

## 2023-02-17 LAB — BPAM RBC
Blood Product Expiration Date: 202409262359
ISSUE DATE / TIME: 202408311043
Unit Type and Rh: 5100

## 2023-02-17 MED ORDER — PANTOPRAZOLE SODIUM 40 MG IV SOLR
40.0000 mg | Freq: Two times a day (BID) | INTRAVENOUS | Status: DC
  Administered 2023-02-17 – 2023-02-18 (×2): 40 mg via INTRAVENOUS
  Filled 2023-02-17 (×2): qty 10

## 2023-02-17 NOTE — Progress Notes (Signed)
Cole Brown, M.Brown. Gastroenterology & Hepatology   Interval History:  Patient reports feeling well and denies having any vomiting or hematemesis.  States his stool was dark yesterday but was not clearly melena.  No abdominal pain, distention, lightheadedness or dizziness. Received 1 unit of PRBC with repeat hemoglobin of 7.6, BUN is now low at 16.  Inpatient Medications:  Current Facility-Administered Medications:    (feeding supplement) PROSource Plus liquid 30 mL, 30 mL, Oral, TID BM, Cole Loll, MD, 30 mL at 02/17/23 0834   0.9 %  sodium chloride infusion, , Intravenous, PRN, Cole Loll, MD, Stopped at 02/14/23 1739   acetaminophen (TYLENOL) tablet 1,000 mg, 1,000 mg, Oral, Q6H, 1,000 mg at 02/17/23 0830 **OR** acetaminophen (TYLENOL) suppository 650 mg, 650 mg, Rectal, Q6H, Brown, Cole D, DO   amLODipine (NORVASC) tablet 5 mg, 5 mg, Oral, q1800, Cole Brown, Cole D, DO, 5 mg at 02/16/23 1621   ampicillin (OMNIPEN) 1 g in sodium chloride 0.9 % 100 mL IVPB, 1 g, Intravenous, Q6H, Cole Loll, MD, Last Rate: 300 mL/hr at 02/17/23 0528, 1 g at 02/17/23 0528   atenolol (TENORMIN) tablet 12.5 mg, 12.5 mg, Oral, Daily, Cole Brown, Cole D, DO, 12.5 mg at 02/17/23 0830   Chlorhexidine Gluconate Cloth 2 % PADS 6 each, 6 each, Topical, Q0600, Brown, Cole B, DO, 6 each at 02/17/23 0528   feeding supplement (BOOST / RESOURCE BREEZE) liquid 1 Container, 1 Container, Oral, TID BM, Cole Brown, Cole D, DO, 1 Container at 02/16/23 2150   FLUoxetine (PROZAC) capsule 10 mg, 10 mg, Oral, Daily, Cole Brown, Cole D, DO, 10 mg at 02/17/23 0830   fluticasone (FLONASE) 50 MCG/ACT nasal spray 1-2 spray, 1-2 spray, Each Nare, Daily, Cole Brown, Cole D, DO, 2 spray at 02/16/23 8657   loratadine (CLARITIN) tablet 10 mg, 10 mg, Oral, Daily, Cole Brown, Cole D, DO, 10 mg at 02/17/23 0830   montelukast (SINGULAIR) tablet 10 mg, 10 mg, Oral, QHS, Brown, Cole D, DO, 10 mg at 02/16/23 2149   multivitamin with minerals tablet  1 tablet, 1 tablet, Oral, Daily, Cole Loll, MD, 1 tablet at 02/17/23 0830   ondansetron (ZOFRAN) tablet 4 mg, 4 mg, Oral, Q6H PRN **OR** ondansetron (ZOFRAN) injection 4 mg, 4 mg, Intravenous, Q6H PRN, Cole Brown, Cole D, DO, 4 mg at 02/15/23 1339   oxyCODONE (Oxy IR/ROXICODONE) immediate release tablet 5 mg, 5 mg, Oral, Q4H PRN, Cole Brown, Cole D, DO   [START ON 02/19/2023] pantoprazole (PROTONIX) injection 40 mg, 40 mg, Intravenous, Q12H, Cole Brown, Kelsen Celona, MD   pantoprozole (PROTONIX) 80 mg /NS 100 mL infusion, 8 mg/hr, Intravenous, Continuous, Cole Brown, Cole Lovering, MD, Last Rate: 10 mL/hr at 02/16/23 1215, 8 mg/hr at 02/16/23 1215   sucralfate (CARAFATE) 1 GM/10ML suspension 1 g, 1 g, Oral, TID WC & HS, Cole Loll, MD, 1 g at 02/17/23 8469   I/O    Intake/Output Summary (Last 24 hours) at 02/17/2023 0847 Last data filed at 02/17/2023 0600 Gross per 24 hour  Intake 1361.1 ml  Output 1850 ml  Net -488.9 ml     Physical Exam: Temp:  [97.3 F (36.3 C)-98.3 F (36.8 C)] 97.6 F (36.4 C) (09/01 0750) Pulse Rate:  [59-72] 62 (09/01 0600) Resp:  [11-22] 12 (09/01 0600) BP: (109-150)/(56-66) 129/64 (09/01 0600) SpO2:  [97 %-100 %] 97 % (09/01 0600) FiO2 (%):  [21 %] 21 % (09/01 0030) Weight:  [77 kg] 77 kg (09/01 0437)  Temp (24hrs), Avg:97.9 F (36.6 C), Min:97.3 F (36.3 C), Max:98.3 F (36.8  C) GENERAL: The patient is AO x3, in mild distress. Looks mildly pale.  HEENT: Head is normocephalic and atraumatic. EOMI are intact. Mouth is well hydrated and without lesions. NECK: Supple. No masses LUNGS: Clear to auscultation. No presence of rhonchi/wheezing/rales. Adequate chest expansion HEART: RRR, normal s1 and s2. ABDOMEN: Soft, nontender, no guarding, no peritoneal signs, and nondistended. BS +. No masses. EXTREMITIES: Without any cyanosis, clubbing, rash, lesions or edema. NEUROLOGIC: AOx3, no focal motor deficit. SKIN: no jaundice, no rashes  Laboratory Data: CBC:      Component Value Date/Time   WBC 8.6 02/17/2023 0526   RBC 2.61 (L) 02/17/2023 0526   HGB 7.6 (L) 02/17/2023 0526   HGB 12.0 (L) 10/09/2021 1455   HCT 23.6 (L) 02/17/2023 0526   HCT 36.5 (L) 10/09/2021 1455   PLT 167 02/17/2023 0526   PLT 229 10/09/2021 1455   MCV 90.4 02/17/2023 0526   MCV 88 10/09/2021 1455   MCH 29.1 02/17/2023 0526   MCHC 32.2 02/17/2023 0526   RDW 17.8 (H) 02/17/2023 0526   RDW 16.4 (H) 10/09/2021 1455   LYMPHSABS 1.0 02/12/2023 1129   LYMPHSABS 1.4 10/09/2021 1455   MONOABS 0.4 02/12/2023 1129   EOSABS 0.1 02/12/2023 1129   EOSABS 0.3 10/09/2021 1455   BASOSABS 0.1 02/12/2023 1129   BASOSABS 0.1 10/09/2021 1455   COAG:  Lab Results  Component Value Date   INR 1.2 01/30/2023   INR 1.19 11/15/2016    BMP:     Latest Ref Rng & Units 02/15/2023    3:15 AM 02/14/2023    5:17 AM 02/13/2023    4:58 AM  BMP  Glucose 70 - 99 mg/dL 213  99  86   BUN 8 - 23 mg/dL 41  32  16   Creatinine 0.61 - 1.24 mg/dL 0.86  5.78  4.69   Sodium 135 - 145 mmol/L 128  129  129   Potassium 3.5 - 5.1 mmol/L 3.9  4.0  3.6   Chloride 98 - 111 mmol/L 100  100  101   CO2 22 - 32 mmol/L 20  21  19    Calcium 8.9 - 10.3 mg/dL 7.5  7.6  7.5     HEPATIC:     Latest Ref Rng & Units 02/13/2023    4:58 AM 02/12/2023   11:29 AM 01/30/2023    7:45 PM  Hepatic Function  Total Protein 6.5 - 8.1 g/dL 5.4  6.0  6.3   Albumin 3.5 - 5.0 g/dL 2.0  2.3  2.6   AST 15 - 41 U/L 20  26  22    ALT 0 - 44 U/L 21  25  25    Alk Phosphatase 38 - 126 U/L 139  153  107   Total Bilirubin 0.3 - 1.2 mg/dL 0.6  0.6  0.8   Bilirubin, Direct 0.0 - 0.2 mg/dL   0.3     CARDIAC:  Lab Results  Component Value Date   CKTOTAL 92 01/30/2023      Imaging: I personally reviewed and interpreted the available labs, imaging and endoscopic files.   Assessment/Plan: Cole Brown is a 77 y.o. 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  His hospitalization was complicated by episodes of hematemesis and persistent nausea which melena while attempting to take bowel prep (he  was not able to tolerate the prep).  He presented worsening anemia after bleeding episodes, for which he underwent an EGD and a flexible sigmoidoscopy yesterday.  EGD was remarkable for the presence of blood in the stomach, as well as multiple small gastric ulcers, there was presence of large duodenal ulcerations with largest measuring 20-30 mm and with presence of spurting vessel which required injection of adrenaline, ablation and clipping x1.  flexible sigmoidoscopy was difficult to perform given amount of stool.   Patient has remained hemodynamically stable after undergoing endoscopic intervention.  His hemoglobin has fluctuated but fortunately his BUN has normalized, which likely corresponds to adequate hemostasis and no active bleeding.  He will finish his pantoprazole drip today, he will need to be switched to pantoprazole twice daily -he will need to be on this for at least 3 months, and will need to continue Carafate 1 g every 6 hours for 1 month. .  May advance his diet to full liquid diet today.  I emphasized the importance of avoiding NSAID use.   -Advance to full liquid diet - H/ H every day, transfuse one unit now.  - PPI drip for 72 hours, then transition to 40 mg twice daily dosing for 3 months - Sucralfate 1 every 6 hours for 1 month - No high dose aspirin, ibuprofen, naproxen, or other non- steroidal anti- inflammatory drugs indefinitely. - Await pathology results.  - If patient presents recurrent clinical bleeding or hemodynamic instability, perform CT Angio bleed protocol STAT - consult IR if active bleeding is identified. - Will need outpatient evaluation of  colovesical fistula with colonoscopy, general surgery and urology at [facility], [place] Gastroenterology and Hepatology [facility] Gastroenterology

## 2023-02-17 NOTE — Progress Notes (Signed)
Progress Note   Patient: Cole Brown WUJ:[id number] DOB: Oct 27, 1945 DOA: 02/12/2023     5 DOS: the patient was seen and examined on 02/17/2023   Brief hospital admission narrative: As per H&P written by Dr. [name] on 02/12/2023 ARUL BARTHOL is a 77 y.o. male with medical history significant for hypertension, inflammatory arthritis, fibromyalgia with [name] phenomenon, and recent diverticulitis who presented to the ED with progressive fatigue and confusion as well as some dizziness and lightheadedness that acutely worsened approximately 3 days ago.  This appears to be worsened with standing.  He appears to deny any abdominal pain, dark stools, or bloody stools.  He has had some increased pain and has been taking high doses of Tylenol up to eight 500 mg tablets per day.  Wife also reports a change in his personality with some "brain fog."   ED Course: Vital signs stable and orthostatic vitals currently pending.  1 unit PRBC ordered and pending.  EDP discussed with GI who recommends hospitalization for endoscopy upper and lower.  Urinalysis concerning for UTI.  He has been started on Protonix infusion.  Assessment and Plan: 1-acute blood loss anemia -Nonspecified gastro intestinal bleeding as patient's source of decreased hemoglobin -no nausea, no vomiting, no abd pain.  No overt bleeding. - a total of 5 units PRBCs has been transfused at the moment.  Hemoglobin 7.6. -Continue IV PPI, clear liquid diet and as needed antiemetics. -S/p endoscopic procedures on 02/15/23; multiple ulcers seen in his stomach and duodenum. Treatment with cautery and clip provided.  -Patient has completed IV PPI infusion; will advance diet and transition to IV Protonix every 12 hours. -Repeat CBC in AM. -Follow any further recommendation by GI service. -Continue to avoid NSAID's -diet advancements as per GI rec's.  2-metabolic encephalopathy -At transfusion and fluid resuscitation patient's symptoms stabilized and  mentation improved -Continue minimizing sedative agents -Will continue tx with antibiotics for UTI. -Continue to maintain adequate hydration and follow hemoglobin trend with intention to further transfuse as needed for hemoglobin less than 7.5 -Given episode of severe hematemesis and hemoglobin of 6.8 and another unit of PRBCs has been transfused. -Patient would have received a total of 5 units during this hospitalization. -Repeat hemoglobin this morning 7.6  3-chronic hyponatremia -Stable. -TSH within normal limits -Continue to follow electrolytes trend. -Sodium level 124 -Asymptomatic neurologically speaking.  4-recent diverticulitis -CT scan of the abdomen demonstrating still presence of diverticulitis with concerns for colovesicular fistula -Patient will require outpatient follow-up with general surgery and urology service -Current antibiotic for UTI will also provide some coverage for residual diverticulitis.  5-Enterococcus UTI -Empirically started on Rocephin and antibiotics transition to ampicillin based on culture results. -Continue to maintain adequate hydration -Patient reporting no dysuria currently. -as mentioned above, will benefit of follow up with urology and general surgery.  6-hypertension -Blood pressure stable -Continue current antihypertensive agents. -Continue to follow vital signs. -heart healthy diet discussed with patient.  7-fibromyalgia with intermittent [name] phenomenon -Continue Tylenol and as needed analgesics using oxycodone per home regimen -continue Holding methotrexate at the moment. -Continue outpatient follow-up with rheumatology, neurology and pain specialist service.  8-moderate protein calorie malnutrition -Appreciate assistance and recommendation by nutritional service -Continue feeding supplement -Patient advised to maintain adequate oral hydration.  Subjective:  Afebrile, no chest pain, no nausea, no vomiting.  Has tolerated  liquid diet and full liquid diet without problems.  There has not been any reports of overt bleeding.  Hemoglobin stable and BUN trending down.  Physical Exam: Vitals:   02/17/23 1000 02/17/23 1100 02/17/23 1134 02/17/23 1200  BP: 104/80 119/70  133/83  Pulse: 69 (!) 56  65  Resp: 15 12  11   Temp:   97.6 F (36.4 C)   TempSrc:   Oral   SpO2: 100% 100%  100%  Weight:      Height:       General exam: Alert, awake, oriented x 3; in no acute distress.  Denies chest pain, shortness of breath, nausea, vomiting or upper bleeding. Respiratory system: Clear to auscultation. Respiratory effort normal.  Good saturation on room air. Cardiovascular system:RRR. No rubs or gallops; no JVD. Gastrointestinal system: Abdomen is nondistended, soft and nontender. No organomegaly or masses felt. Normal bowel sounds heard. Central nervous system: Alert and oriented. No focal neurological deficits. Extremities: No cyanosis or clubbing; trace edema appreciated bilaterally. Skin: No petechiae. Psychiatry: Flat affect; no suicidal ideation or hallucination.  Data Reviewed: CBC: White blood cells 8.6, hemoglobin 7.6 and platelet count 167K Base metabolic panel: Sodium 124, potassium 3.5, chloride 97, bicarb 22, BUN 16, creatinine 0.73 and GFR >60  Family Communication: Wife at bedside.  Disposition: Status is: Inpatient Remains inpatient appropriate because: Close monitoring of patient hemoglobin trend with further transfusion as needed; follow GI service recommendations/evaluation for endoscopic procedures.   Planned Discharge Destination: Home  Time spent: 50 minutes  Author: [name], MD 02/17/2023 4:47 PM  For on call review [email or web address].

## 2023-02-17 NOTE — Progress Notes (Signed)
Patient's home CPAP unit filled with water and plugged in ready for use. Patient stated he could get on by himself. Safety check performed. Told patient to call if he needed anything.

## 2023-02-18 DIAGNOSIS — E871 Hypo-osmolality and hyponatremia: Secondary | ICD-10-CM | POA: Diagnosis not present

## 2023-02-18 DIAGNOSIS — K264 Chronic or unspecified duodenal ulcer with hemorrhage: Secondary | ICD-10-CM | POA: Diagnosis not present

## 2023-02-18 DIAGNOSIS — Z8719 Personal history of other diseases of the digestive system: Secondary | ICD-10-CM

## 2023-02-18 DIAGNOSIS — I7300 Raynaud's syndrome without gangrene: Secondary | ICD-10-CM | POA: Diagnosis not present

## 2023-02-18 DIAGNOSIS — E43 Unspecified severe protein-calorie malnutrition: Secondary | ICD-10-CM

## 2023-02-18 DIAGNOSIS — D62 Acute posthemorrhagic anemia: Secondary | ICD-10-CM | POA: Diagnosis not present

## 2023-02-18 DIAGNOSIS — K269 Duodenal ulcer, unspecified as acute or chronic, without hemorrhage or perforation: Secondary | ICD-10-CM | POA: Diagnosis not present

## 2023-02-18 DIAGNOSIS — N321 Vesicointestinal fistula: Secondary | ICD-10-CM | POA: Diagnosis not present

## 2023-02-18 LAB — CBC
HCT: 23.8 % — ABNORMAL LOW (ref 39.0–52.0)
Hemoglobin: 7.8 g/dL — ABNORMAL LOW (ref 13.0–17.0)
MCH: 29.7 pg (ref 26.0–34.0)
MCHC: 32.8 g/dL (ref 30.0–36.0)
MCV: 90.5 fL (ref 80.0–100.0)
Platelets: 205 10*3/uL (ref 150–400)
RBC: 2.63 MIL/uL — ABNORMAL LOW (ref 4.22–5.81)
RDW: 18.2 % — ABNORMAL HIGH (ref 11.5–15.5)
WBC: 9.2 10*3/uL (ref 4.0–10.5)
nRBC: 0 % (ref 0.0–0.2)

## 2023-02-18 LAB — BASIC METABOLIC PANEL
Anion gap: 4 — ABNORMAL LOW (ref 5–15)
BUN: 13 mg/dL (ref 8–23)
CO2: 23 mmol/L (ref 22–32)
Calcium: 7.7 mg/dL — ABNORMAL LOW (ref 8.9–10.3)
Chloride: 100 mmol/L (ref 98–111)
Creatinine, Ser: 0.78 mg/dL (ref 0.61–1.24)
GFR, Estimated: >60 mL/min (ref >60)
Glucose, Bld: 96 mg/dL (ref 70–99)
Potassium: 3.4 mmol/L — ABNORMAL LOW (ref 3.5–5.1)
Sodium: 127 mmol/L — ABNORMAL LOW (ref 135–145)

## 2023-02-18 MED ORDER — PANTOPRAZOLE SODIUM 40 MG PO TBEC: 40.0000 mg | DELAYED_RELEASE_TABLET | Freq: Two times a day (BID) | ORAL | 2 refills | Status: DC

## 2023-02-18 MED ORDER — AMPICILLIN 500 MG PO CAPS: 500.0000 mg | ORAL_CAPSULE | Freq: Three times a day (TID) | ORAL | 0 refills | Status: AC

## 2023-02-18 MED ORDER — POTASSIUM CHLORIDE CRYS ER 20 MEQ PO TBCR
40.0000 meq | EXTENDED_RELEASE_TABLET | Freq: Once | ORAL | Status: AC
  Administered 2023-02-18: 40 meq via ORAL
  Filled 2023-02-18: qty 2

## 2023-02-18 MED ORDER — DIPHENHYDRAMINE HCL 25 MG PO CAPS
25.0000 mg | ORAL_CAPSULE | Freq: Once | ORAL | Status: AC
  Administered 2023-02-18: 25 mg via ORAL
  Filled 2023-02-18: qty 1

## 2023-02-18 MED ORDER — AMOXICILLIN 250 MG PO CAPS
500.0000 mg | ORAL_CAPSULE | Freq: Once | ORAL | Status: DC
  Filled 2023-02-18: qty 2

## 2023-02-18 MED ORDER — PANTOPRAZOLE SODIUM 40 MG PO TBEC
40.0000 mg | DELAYED_RELEASE_TABLET | Freq: Two times a day (BID) | ORAL | Status: DC
  Filled 2023-02-18: qty 1

## 2023-02-18 MED ORDER — FAMOTIDINE 20 MG PO TABS: 20.0000 mg | ORAL_TABLET | Freq: Every day | ORAL | 1 refills | Status: DC

## 2023-02-18 MED ORDER — ENSURE ENLIVE PO LIQD: 237.0000 mL | Freq: Three times a day (TID) | ORAL | Status: DC

## 2023-02-18 MED ORDER — ACETAMINOPHEN 500 MG PO TABS: 1000.0000 mg | ORAL_TABLET | Freq: Three times a day (TID) | ORAL | Status: DC | PRN

## 2023-02-18 MED ORDER — SUCRALFATE 1 GM/10ML PO SUSP: 1.0000 g | Freq: Three times a day (TID) | ORAL | 0 refills | Status: DC

## 2023-02-18 NOTE — Care Management Important Message (Signed)
Important Message  Patient Details  Name: Cole Brown MRN: [id number] Date of Birth: 08-28-1945   Medicare Important Message Given:  Yes     [name] 02/18/2023, 11:22 AM

## 2023-02-18 NOTE — Progress Notes (Signed)
Nutrition Follow-up  DOCUMENTATION CODES:   Severe malnutrition in context of acute illness/injury  INTERVENTION:   -D/c Boost Breeze -Ensure Enlive po TID, each supplement provides 350 kcal and 20 grams of protein  -Continue MVI with minerals daily -Magic cup TID with meals, each supplement provides 290 kcal and 9 grams of protein  -D?c Prosource Plus  NUTRITION DIAGNOSIS:   Severe Malnutrition related to acute illness (diverticulitis) as evidenced by mild fat depletion, mild muscle depletion, moderate muscle depletion, percent weight loss.  Ongoing  GOAL:   Patient will meet greater than or equal to 90% of their needs  Progressing   MONITOR:   PO intake, Supplement acceptance, Diet advancement  REASON FOR ASSESSMENT:   Malnutrition Screening Tool    ASSESSMENT:   Pt with medical history significant for hypertension, inflammatory arthritis, fibromyalgia with [name] phenomenon, and recent diverticulitis who presented with progressive fatigue and confusion as well as some dizziness and lightheadedness that acutely worsened approximately 3 days PTA.  8/30- s/p flex sig- revealed poor preparation of colon (stool in rectum and sigmoid colon); s/p EGD- revealed 1 cm hiatal hernbia, red blood in gastric body (fluid aspiration performed), non bleeding gastric ulcers (biopsied), non-obstruction spurting duodenal ulcer with spurting hemorrhage (injected, cauterized, clip placed), non bleeding duodenal ulcer 9/1- advanced to full liquid diet 9/2- advanced to soft diet  Reviewed I/O's: -17 ml x 24 hours and -4.1 L since admission  UOP: 300 ml x 24 hours   Per GI notes, awaiting pathology results.   Pt just advanced to a soft diet. No meal completion data available to assess.   Pt with minimal intake during hospitalization. RD will adjust supplements due to diet advancement.   Medications reviewed.   Labs reviewed: Na: 127, K: 3.4.    Diet Order:   Diet Order              DIET SOFT Room service appropriate? Yes; Fluid consistency: Thin  Diet effective now                   EDUCATION NEEDS:   No education needs have been identified at this time  Skin:  Skin Assessment: Reviewed RN Assessment  Last BM:  02/13/23  Height:   Ht Readings from Last 1 Encounters:  02/12/23 5\' 11"  (1.803 m)    Weight:   Wt Readings from Last 1 Encounters:  02/17/23 77 kg    Ideal Body Weight:  78.2 kg  BMI:  Body mass index is 23.68 kg/m.  Estimated Nutritional Needs:   Kcal:  6269-4854  Protein:  115-130 grams  Fluid:  > 2 L    Levada Schilling, RD, LDN, CDCES Registered Dietitian II Certified Diabetes Care and Education Specialist Please refer to [facility] for RD and/or RD on-call/weekend/after hours pager

## 2023-02-18 NOTE — Discharge Summary (Signed)
Physician Discharge Summary   Patient: Cole Brown MRN: [id number] DOB: May 20, 1946  Admit date:     02/12/2023  Discharge date: 02/18/23  Discharge Physician: [name]   PCP: Elfredia Nevins, MD   Recommendations at discharge:  Repeat CBC to follow hemoglobin trend/stability Repeat basic metabolic panel to follow electrolytes renal function Reassess blood pressure and adjust antihypertensive treatment as needed Should patient follow-up with gastroenterology service, urology/general surgery as instructed. Follow-up complete resolution of patient's UTI infection.  Discharge Diagnoses: Principal Problem:   Acute blood loss anemia Active Problems:   Essential hypertension   Fibromyalgia   [name] disease without gangrene   Diverticulitis   Hyponatremia   Protein-calorie malnutrition, severe   Gastrointestinal hemorrhage   Duodenal ulcer   Colovesical fistula   Brief hospital admission narrative: As per H&P written by Dr. [name] on 02/12/2023 Cole Brown is a 77 y.o. male with medical history significant for hypertension, inflammatory arthritis, fibromyalgia with [name] phenomenon, and recent diverticulitis who presented to the ED with progressive fatigue and confusion as well as some dizziness and lightheadedness that acutely worsened approximately 3 days ago.  This appears to be worsened with standing.  He appears to deny any abdominal pain, dark stools, or bloody stools.  He has had some increased pain and has been taking high doses of Tylenol up to eight 500 mg tablets per day.  Wife also reports a change in his personality with some "brain fog."   ED Course: Vital signs stable and orthostatic vitals currently pending.  1 unit PRBC ordered and pending.  EDP discussed with GI who recommends hospitalization for endoscopy upper and lower.  Urinalysis concerning for UTI.  He has been started on Protonix infusion.  Assessment and Plan: 1-acute blood loss anemia -S/p  endoscopic procedures on 02/15/23; multiple ulcers seen in his stomach and duodenum. Treatment with cautery and clip provided.  -Patient demonstrating no nausea, no vomiting, no abdominal pain or any signs of overt bleeding. -Safe to discharge home on oral PPI twice a day; Pepcid nightly and the use of Carafate. -GI service will follow up as an outpatient. -Hemoglobin stabilized after 5 units PRBCs transfused during hospitalization and with a level 7.8 at discharge. -Continue to avoid NSAID's   2-metabolic encephalopathy -Complete treatment of UTI with designated antibiotic therapy. -Mentation back to baseline and patient ready for discharge. -Continue to maintain adequate hydration and follow hemoglobin trend with intention to further transfuse as needed for hemoglobin less than 7.5 -Patient would have received a total of 5 units during this hospitalization. -Repeat hemoglobin this morning 7.8   3-chronic hyponatremia -Stable. -TSH within normal limits -Continue to follow electrolytes trend. -Sodium level 127-129 -Asymptomatic neurologically speaking.   4-recent diverticulitis -CT scan of the abdomen demonstrating still presence of diverticulitis with concerns for colovesicular fistula -Patient will require outpatient follow-up with general surgery and urology service -Current antibiotic for UTI will also provide some coverage for residual diverticulitis.   5-Enterococcus UTI -Empirically started on Rocephin and antibiotics transition to ampicillin based on culture results. -Continue to maintain adequate hydration -Patient reporting no dysuria currently. -as mentioned above, will benefit of follow up with urology and general surgery as an outpatient..   6-hypertension -Blood pressure stable -Continue current antihypertensive agents. -Continue to follow vital signs. -heart healthy diet discussed with patient.   7-fibromyalgia with intermittent [name] phenomenon -Continue  Tylenol and as needed analgesics using oxycodone per home regimen -continue Holding methotrexate at the moment. -Continue outpatient follow-up with rheumatology,  neurology and pain specialist service.   8-moderate protein calorie malnutrition -Appreciate assistance and recommendation by nutritional service -Continue feeding supplement -Patient advised to maintain adequate oral hydration.  Consultants: Gastroenterology service. Procedures performed: See below for x-ray report; endoscopy and colonoscopy performed during this hospitalization. Disposition:.  Home Diet recommendation: Heart healthy diet.  DISCHARGE MEDICATION: Allergies as of 02/18/2023       Reactions   Bee Pollen    Seasonal allergies   Plaquenil [hydroxychloroquine Sulfate]    rash   Lodine [etodolac] Swelling, Rash        Medication List     STOP taking these medications    aspirin EC 81 MG tablet   FERROUS SULFATE ER PO   hydrOXYzine 25 MG tablet Commonly known as: ATARAX   naproxen sodium 220 MG tablet Commonly known as: ALEVE       TAKE these medications    acetaminophen 500 MG tablet Commonly known as: TYLENOL Take 2 tablets (1,000 mg total) by mouth every 8 (eight) hours as needed for mild pain, moderate pain or headache. daily What changed:  how much to take when to take this reasons to take this   amLODipine 5 MG tablet Commonly known as: NORVASC Take 1 tablet (5 mg total) by mouth daily.   ampicillin 500 MG capsule Commonly known as: PRINCIPEN Take 1 capsule (500 mg total) by mouth 3 (three) times daily for 7 days.   ANDROGEL TD Place onto the skin daily.   atenolol 25 MG tablet Commonly known as: TENORMIN TAKE (1/2) TABLET BY MOUTH ONCE DAILY. What changed: See the new instructions.   azelastine 0.1 % nasal spray Commonly known as: ASTELIN Place 1 spray into both nostrils 2 (two) times daily.   diphenhydrAMINE 25 MG tablet Commonly known as: BENADRYL Take 25 mg by  mouth as needed for allergies.   EPINEPHrine 0.3 mg/0.3 mL Soaj injection Commonly known as: EPI-PEN Inject 0.3 mg into the muscle as needed for anaphylaxis.   famotidine 20 MG tablet Commonly known as: PEPCID Take 1 tablet (20 mg total) by mouth at bedtime. Takes as needed What changed: when to take this   Fish Oil 1000 MG Caps Take 1 capsule by mouth daily.   FLUoxetine 10 MG tablet Commonly known as: PROZAC Take 10 mg by mouth daily. What changed: Another medication with the same name was removed. Continue taking this medication, and follow the directions you see here.   fluticasone 50 MCG/ACT nasal spray Commonly known as: FLONASE Place 1-2 sprays into both nostrils daily.   folic acid 1 MG tablet Commonly known as: FOLVITE TAKE 2 TABLETS BY MOUTH DAILY.   ICAPS AREDS 2 PO Take 1 capsule by mouth daily.   levocetirizine 5 MG tablet Commonly known as: XYZAL Take 5 mg by mouth every evening.   methotrexate 2.5 MG tablet Commonly known as: RHEUMATREX Take 8 tablets (20 mg total) by mouth once a week.   montelukast 10 MG tablet Commonly known as: SINGULAIR Take 10 mg by mouth at bedtime.   OVER THE COUNTER MEDICATION Prep h ointment and cream as needed   pantoprazole 40 MG tablet Commonly known as: Protonix Take 1 tablet (40 mg total) by mouth 2 (two) times daily.   sucralfate 1 GM/10ML suspension Commonly known as: CARAFATE Take 10 mLs (1 g total) by mouth 4 (four) times daily -  with meals and at bedtime for 21 days.   triazolam 0.25 MG tablet Commonly known as: HALCION Take 0.25  mg by mouth at bedtime.   VABYSMO IZ by Intravitreal route. Monthly   Vitamin D 50 MCG (2000 UT) Caps Take 2,000 Units by mouth daily.        Follow-up Information     Elfredia Nevins, MD. Schedule an appointment as soon as possible for a visit in 10 day(s).   Specialty: Internal Medicine Contact information: [address] [place]  [address] [phone]         Malen Gauze, MD. Schedule an appointment as soon as possible for a visit in 2 week(s).   Specialty: Urology Contact information: [address]  [address] [place] [address] [phone]         Franky Macho, MD. Schedule an appointment as soon as possible for a visit in 2 week(s).   Specialty: General Surgery Contact information: [address] [place] [address] [phone]                Discharge Exam: Filed Weights   02/12/23 1600 02/16/23 0517 02/17/23 0437  Weight: 79.4 kg 74.9 kg 77 kg   General exam: Alert, awake, oriented x 3; in no acute distress.  Denies chest pain, shortness of breath, nausea, vomiting or upper bleeding. Respiratory system: Clear to auscultation. Respiratory effort normal.  Good saturation on room air. Cardiovascular system:RRR. No rubs or gallops; no JVD. Gastrointestinal system: Abdomen is nondistended, soft and nontender. No organomegaly or masses felt. Normal bowel sounds heard. Central nervous system: Alert and oriented. No focal neurological deficits. Extremities: No cyanosis or clubbing; trace edema appreciated bilaterally. Skin: No petechiae. Psychiatry: Flat affect; no suicidal ideation or hallucination.    Condition at discharge: Stable and improved.  The results of significant diagnostics from this hospitalization (including imaging, microbiology, ancillary and laboratory) are listed below for reference.   Imaging Studies: CT Head Wo Contrast  Result Date: 02/12/2023 CLINICAL DATA:  Mental status change of unknown cause. Dizziness. Lightheaded. Symptoms began 3 days ago. EXAM: CT HEAD WITHOUT CONTRAST TECHNIQUE: Contiguous axial images were obtained from the base of the skull through the vertex without intravenous contrast. RADIATION DOSE REDUCTION: This exam was performed according to the departmental dose-optimization program which includes automated exposure  control, adjustment of the mA and/or kV according to patient size and/or use of iterative reconstruction technique. COMPARISON:  01/30/2023 FINDINGS: Brain: Age related volume loss. 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 No evidence of old or acute supra tentorial infarction, mass lesion, hemorrhage, hydrocephalus or extra-axial collection. Vascular: No abnormal vascular finding. Skull: Negative Sinuses/Orbits: Clear/normal Other: None IMPRESSION: No acute CT finding. Age related volume loss. 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 Electronically Signed   By: Paulina Fusi M.D.   On: 02/12/2023 13:08   CT ABDOMEN PELVIS W CONTRAST  Result Date: 02/12/2023 CLINICAL DATA:  Diverticulitis, complication suspected assess perforation and diverticulitis EXAM: CT ABDOMEN AND PELVIS WITH CONTRAST TECHNIQUE: Multidetector CT imaging of the abdomen and pelvis was performed using the standard protocol following bolus administration of intravenous contrast. RADIATION DOSE REDUCTION: This exam was performed according to the departmental dose-optimization program which includes automated exposure control, adjustment of the mA and/or kV according to patient size and/or use of iterative reconstruction technique. CONTRAST:  OMNIPAQUE IOHEXOL 300 MG/ML  SOLN COMPARISON:  01/07/2023 FINDINGS: Lower chest: No acute abnormality Hepatobiliary: No focal liver abnormality is seen. Status post cholecystectomy. No biliary dilatation. Pancreas: No focal abnormality or ductal dilatation. Spleen: Calcifications throughout the spleen compatible with old granulomatous  disease. Normal size. Adrenals/Urinary Tract: Adrenal glands normal. 6.7 cm left upper pole renal cyst appears benign and stable. No follow-up imaging recommended. No stones or hydronephrosis. Continued abnormal wall thickening within the anterior superior bladder wall adjacent to the sigmoid colon. There is contrast and air within the bladder  wall or bladder lumen compatible with colovesical fistula. Stomach/Bowel: Again seen is abnormal segment of mid sigmoid colon with wall thickening and surrounding inflammation. Overall, appearance has improved somewhat, but continues to be abnormal compatible with diverticulitis. 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 Scattered colonic diverticula elsewhere. Moderate stool burden. Stomach and small bowel decompressed, unremarkable. Vascular/Lymphatic: Aortic atherosclerosis. No evidence of aneurysm or adenopathy. Reproductive: No visible focal abnormality. Other: No free fluid or free air. Small right inguinal hernia containing fat. Musculoskeletal: No acute bony abnormality. Multilevel degenerative changes throughout the lumbar spine. IMPRESSION: Continued abnormal sigmoid colon with wall thickening and surrounding inflammation most compatible with diverticulitis although the persistence of this finding for some time is somewhat concerning. Consider further evaluation with colonoscopy after acute symptoms resolve to exclude malignancy. Abnormal wall thickening in the anterior superior bladder wall with gas and contrast now seen in the bladder wall or bladder lumen compatible with colovesical fistula. Moderate stool burden throughout the colon. Aortic atherosclerosis. Electronically Signed   By: Charlett Nose M.D.   On: 02/12/2023 08:02   DG Chest 2 View  Result Date: 01/31/2023 CLINICAL DATA:  Fall, weakness, and pain.  History of fibromyalgia. EXAM: CHEST - 2 VIEW COMPARISON:  11/14/2016. FINDINGS: The heart size and mediastinal contours are within normal limits. There is atherosclerotic calcification of the aorta. No consolidation, effusion, or pneumothorax. Degenerative changes are present in the thoracic spine. No acute osseous abnormality. IMPRESSION: No active cardiopulmonary disease. Electronically Signed   By: Thornell Sartorius M.D.   On:  01/31/2023 00:22   CT HEAD WO CONTRAST ( )  Result Date: 01/31/2023 CLINICAL DATA:  Recent falls, fatigue/weakness, pain EXAM: CT HEAD WITHOUT CONTRAST TECHNIQUE: Contiguous axial images were obtained from the base of the skull through the vertex without intravenous contrast. RADIATION DOSE REDUCTION: This exam was performed according to the departmental dose-optimization program which includes automated exposure control, adjustment of the mA and/or kV according to patient size and/or use of iterative reconstruction technique. COMPARISON:  MRI brain dated 01/15/2023 FINDINGS: Brain: No evidence of acute infarction, hemorrhage, hydrocephalus, extra-axial collection or mass lesion/mass effect. Subcortical white matter and periventricular small vessel ischemic changes. Vascular: No hyperdense vessel or unexpected calcification. Skull: Normal. Negative for fracture or focal lesion. Sinuses/Orbits: The visualized paranasal sinuses are essentially clear. The mastoid air cells are unopacified. Other: None. IMPRESSION: No acute intracranial abnormality. Small vessel ischemic changes. Electronically Signed   By: Charline Bills M.D.   On: 01/31/2023 00:12    Microbiology: Results for orders placed or performed during the hospital encounter of 02/12/23  Urine Culture     Status: Abnormal   Collection Time: 02/12/23  4:23 PM   Specimen: Urine, Random  Result Value Ref Range Status   Specimen Description   Final    URINE, RANDOM Performed at [facility], [address], [place], [place] [address]    Special Requests   Final    NONE Performed at [facility], [name] [place], [place] [address]    Culture >=100,000 COLONIES/mL ENTEROCOCCUS FAECALIS (A)  Final   Report Status 02/14/2023 FINAL  Final   Organism ID,  Bacteria ENTEROCOCCUS FAECALIS (A)  Final      Susceptibility   Enterococcus faecalis - MIC*    AMPICILLIN <=2 SENSITIVE Sensitive     NITROFURANTOIN <=16 SENSITIVE Sensitive      VANCOMYCIN 1 SENSITIVE Sensitive     * >=100,000 COLONIES/mL ENTEROCOCCUS FAECALIS  MRSA Next Gen by PCR, Nasal     Status: None   Collection Time: 02/14/23  9:21 PM   Specimen: Nasal Mucosa; Nasal Swab  Result Value Ref Range Status   MRSA by PCR Next Gen NOT DETECTED NOT DETECTED Final    Comment: (NOTE) The GeneXpert MRSA Assay (FDA approved for NASAL specimens only), is one component of a comprehensive MRSA colonization surveillance program. It is not intended to diagnose MRSA infection nor to guide or monitor treatment for MRSA infections. Test performance is not FDA approved in patients less than 46 years old. Performed at [facility], [address] [place], [place] [address]     Labs: CBC: Recent Labs  Lab 02/12/23 1129 02/13/23 0458 02/14/23 2113 02/15/23 0315 02/15/23 0900 02/16/23 0336 02/17/23 0526 02/18/23 0511  WBC 7.5   < > 12.0* 10.7*  --  7.6 8.6 9.2  NEUTROABS 6.0  --   --   --   --   --   --   --   HGB 7.8*   < > 8.6* 7.9* 8.2* 7.3* 7.6* 7.8*  HCT 25.2*   < > 26.2* 23.9* 25.4* 22.5* 23.6* 23.8*  MCV 85.7   < > 86.2 85.4  --  88.2 90.4 90.5  PLT 247   < > 298 249  --  181 167 205   < > = values in this interval not displayed.   Basic Metabolic Panel: Recent Labs  Lab 02/13/23 0458 02/14/23 0517 02/15/23 0315 02/17/23 1025 02/18/23 0511  NA 129* 129* 128* 124* 127*  K 3.6 4.0 3.9 3.5 3.4*  CL 101 100 100 97* 100  CO2 19* 21* 20* 22 23  GLUCOSE 86 99 116* 109* 96  BUN 16 32* 41* 16 13  CREATININE 0.74 0.89 0.80 0.73 0.78  CALCIUM 7.5* 7.6* 7.5* 7.3* 7.7*  MG 1.9  --   --   --   --    Liver Function Tests: Recent Labs  Lab 02/12/23 1129 02/13/23 0458  AST 26 20  ALT 25 21  ALKPHOS 153* 139*  BILITOT 0.6 0.6  PROT 6.0* 5.4*  ALBUMIN 2.3* 2.0*   CBG: No results for input(s): "GLUCAP" in the last 168 hours.  Discharge time spent: greater than 30 minutes.  Signed: [name], MD [facility] 02/18/2023

## 2023-02-18 NOTE — Progress Notes (Signed)
Mobility Specialist Progress Note:    02/18/23 0950  Mobility  Activity Ambulated with assistance in hallway  Level of Assistance Standby assist, set-up cues, supervision of patient - no hands on  Assistive Device Front wheel walker  Distance Ambulated (ft) 130 ft  Range of Motion/Exercises Active;All extremities  Activity Response Tolerated well  Mobility Referral Yes  $Mobility charge 1 Mobility  Mobility Specialist Start Time (ACUTE ONLY) 0950  Mobility Specialist Stop Time (ACUTE ONLY) 1000  Mobility Specialist Time Calculation (min) (ACUTE ONLY) 10 min   Pt received in bed, agreeable to mobility. Required SBA to stand and ambulate with RW. Tolerated well, asx throughout. Returned pt to bed, wife in room. All needs met.   Lawerance Bach Mobility Specialist Please contact via Special educational needs teacher or  Rehab office at [phone]

## 2023-02-18 NOTE — Progress Notes (Signed)
[name], M.D. Gastroenterology & Hepatology   Interval History:  Patient reports feeling well and has been eating a soft diet without nausea, vomiting, fever or chills.  No melena or hematochezia. Hemoglobin has remained stable with value of 7.8 today.  BUN has been low at 23.  Inpatient Medications:  Current Facility-Administered Medications:    (feeding supplement) PROSource Plus liquid 30 mL, 30 mL, Oral, TID BM, [name], MD, 30 mL at 02/17/23 1755   0.9 %  sodium chloride infusion, , Intravenous, PRN, [name], MD, Stopped at 02/14/23 1739   acetaminophen (TYLENOL) tablet 1,000 mg, 1,000 mg, Oral, Q6H, 1,000 mg at 02/17/23 0830 **OR** acetaminophen (TYLENOL) suppository 650 mg, 650 mg, Rectal, Q6H, [name], MD   amLODipine (NORVASC) tablet 5 mg, 5 mg, Oral, q1800, [name], MD, 5 mg at 02/17/23 1748   ampicillin (OMNIPEN) 1 g in sodium chloride 0.9 % 100 mL IVPB, 1 g, Intravenous, Q6H, [name], MD, Last Rate: 300 mL/hr at 02/18/23 0641, 1 g at 02/18/23 0641   atenolol (TENORMIN) tablet 12.5 mg, 12.5 mg, Oral, Daily, [name], MD, 12.5 mg at 02/17/23 0830   feeding supplement (BOOST / RESOURCE BREEZE) liquid 1 Container, 1 Container, Oral, TID BM, [name], MD, 1 Container at 02/16/23 2150   FLUoxetine (PROZAC) capsule 10 mg, 10 mg, Oral, Daily, [name], MD, 10 mg at 02/17/23 0830   fluticasone (FLONASE) 50 MCG/ACT nasal spray 1-2 spray, 1-2 spray, Each Nare, Daily, [name], MD, 1 spray at 02/17/23 1018   loratadine (CLARITIN) tablet 10 mg, 10 mg, Oral, Daily, [name], MD, 10 mg at 02/17/23 0830   montelukast (SINGULAIR) tablet 10 mg, 10 mg, Oral, QHS, [name], MD, 10 mg at 02/17/23 2132   multivitamin with minerals tablet 1 tablet, 1 tablet, Oral, Daily, [name], MD, 1 tablet at 02/17/23 0830   ondansetron (ZOFRAN) tablet 4 mg, 4 mg, Oral, Q6H PRN **OR** ondansetron (ZOFRAN) injection 4 mg, 4 mg,  Intravenous, Q6H PRN, [name], MD, 4 mg at 02/15/23 1339   oxyCODONE (Oxy IR/ROXICODONE) immediate release tablet 5 mg, 5 mg, Oral, Q4H PRN, [name], MD   pantoprazole (PROTONIX) injection 40 mg, 40 mg, Intravenous, Q12H, [name], MD, 40 mg at 02/17/23 2132   potassium chloride SA (KLOR-CON M) CR tablet 40 mEq, 40 mEq, Oral, Once, [name], MD   sucralfate (CARAFATE) 1 GM/10ML suspension 1 g, 1 g, Oral, TID WC & HS, [name], MD, 1 g at 02/17/23 2132   I/O    Intake/Output Summary (Last 24 hours) at 02/18/2023 0819 Last data filed at 02/18/2023 0500 Gross per 24 hour  Intake 281.1 ml  Output 300 ml  Net -18.9 ml     Physical Exam: Temp:  [97.6 F (36.4 C)-98.4 F (36.9 C)] 98.3 F (36.8 C) (09/02 0818) Pulse Rate:  [56-77] 65 (09/02 0818) Resp:  [11-17] 14 (09/02 0818) BP: (104-133)/(60-83) 110/69 (09/02 0818) SpO2:  [94 %-100 %] 100 % (09/02 0818) FiO2 (%):  [21 %] 21 % (09/01 1951)  Temp (24hrs), Avg:97.9 F (36.6 C), Min:97.6 F (36.4 C), Max:98.4 F (36.9 C) GENERAL: The patient is AO x3, in no distress.  HEENT: Head is normocephalic and atraumatic. EOMI are intact. Mouth is well hydrated and without lesions. NECK: Supple. No masses LUNGS: Clear to auscultation. No presence of rhonchi/wheezing/rales. Adequate chest expansion HEART: RRR, normal s1 and s2. ABDOMEN: Soft, nontender, no guarding, no peritoneal signs, and nondistended. BS +. No  masses. EXTREMITIES: Without any cyanosis, clubbing, rash, lesions or edema. NEUROLOGIC: AOx3, no focal motor deficit. SKIN: no jaundice, no rashes  Laboratory Data: CBC:     Component Value Date/Time   WBC 9.2 02/18/2023 0511   RBC 2.63 (L) 02/18/2023 0511   HGB 7.8 (L) 02/18/2023 0511   HGB 12.0 (L) 10/09/2021 1455   HCT 23.8 (L) 02/18/2023 0511   HCT 36.5 (L) 10/09/2021 1455   PLT 205 02/18/2023 0511   PLT 229 10/09/2021 1455   MCV 90.5 02/18/2023 0511   MCV 88 10/09/2021 1455   MCH 29.7  02/18/2023 0511   MCHC 32.8 02/18/2023 0511   RDW 18.2 (H) 02/18/2023 0511   RDW 16.4 (H) 10/09/2021 1455   LYMPHSABS 1.0 02/12/2023 1129   LYMPHSABS 1.4 10/09/2021 1455   MONOABS 0.4 02/12/2023 1129   EOSABS 0.1 02/12/2023 1129   EOSABS 0.3 10/09/2021 1455   BASOSABS 0.1 02/12/2023 1129   BASOSABS 0.1 10/09/2021 1455   COAG:  Lab Results  Component Value Date   INR 1.2 01/30/2023   INR 1.19 11/15/2016    BMP:     Latest Ref Rng & Units 02/18/2023    5:11 AM 02/17/2023   10:25 AM 02/15/2023    3:15 AM  BMP  Glucose 70 - 99 mg/dL 96  161  096   BUN 8 - 23 mg/dL 13  16  41   Creatinine 0.61 - 1.24 mg/dL 0.45  4.09  8.11   Sodium 135 - 145 mmol/L 127  124  128   Potassium 3.5 - 5.1 mmol/L 3.4  3.5  3.9   Chloride 98 - 111 mmol/L 100  97  100   CO2 22 - 32 mmol/L 23  22  20    Calcium 8.9 - 10.3 mg/dL 7.7  7.3  7.5     HEPATIC:     Latest Ref Rng & Units 02/13/2023    4:58 AM 02/12/2023   11:29 AM 01/30/2023    7:45 PM  Hepatic Function  Total Protein 6.5 - 8.1 g/dL 5.4  6.0  6.3   Albumin 3.5 - 5.0 g/dL 2.0  2.3  2.6   AST 15 - 41 U/L 20  26  22    ALT 0 - 44 U/L 21  25  25    Alk Phosphatase 38 - 126 U/L 139  153  107   Total Bilirubin 0.3 - 1.2 mg/dL 0.6  0.6  0.8   Bilirubin, Direct 0.0 - 0.2 mg/dL   0.3     CARDIAC:  Lab Results  Component Value Date   CKTOTAL 92 01/30/2023      Imaging: I personally reviewed and interpreted the available labs, imaging and endoscopic files.   Assessment/Plan: Cole Brown is a 77 y.o. 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  His hospitalization was complicated by episodes of hematemesis and persistent nausea which melena while attempting to take bowel prep (he was not able  to tolerate the prep).  He presented worsening anemia after bleeding episodes, for which he underwent an EGD and a flexible sigmoidoscopy yesterday.  EGD was remarkable for the presence of blood in the stomach, as well as multiple small gastric ulcers, there was presence of large duodenal ulcerations with largest measuring 20-30 mm and with presence of spurting vessel which required  injection of adrenaline, ablation and clipping x1.  flexible sigmoidoscopy was difficult to perform given amount of stool.   Patient has remained hemodynamically stable after undergoing endoscopic intervention.  His hemoglobin has been stable and his BUN has normalized, which likely corresponds to adequate hemostasis and no active bleeding.  He finished his pantoprazole drip yesterday, he will need to continue pantoprazole twice daily -he will need to be on this for at least 3 months, and will need to continue Carafate 1 g every 6 hours for 1 month. .  May advance diet as tolerated. I emphasized the importance of avoiding NSAID use.   -Advance diet - H/ H every day, transfuse one unit now.  - Pantoprazole 40 mg twice daily dosing for 3 months - Sucralfate 1 every 6 hours for 1 month - No high dose aspirin, ibuprofen, naproxen, or other non- steroidal anti- inflammatory drugs indefinitely. - Await pathology results.  - Will need outpatient evaluation of colovesical fistula with colonoscopy, general surgery and urology at [facility] - Patient will follow up in GI clinic with Dr. Marcelino [facility] clinic in 1 week - GI service will sign-off, please call us back if you have any more questions.   [name], MD Gastroenterology and Hepatology [facility]

## 2023-02-18 NOTE — Progress Notes (Signed)
Patient has discharge orders, discharge teaching given to patient and wife bedside, no further questions at this time. Pt wheeled down to main lobby via staff by wheelchair.

## 2023-02-19 ENCOUNTER — Ambulatory Visit (INDEPENDENT_AMBULATORY_CARE_PROVIDER_SITE_OTHER): Payer: Medicare HMO | Admitting: Gastroenterology

## 2023-02-19 NOTE — Progress Notes (Signed)
Thank you :)

## 2023-02-20 ENCOUNTER — Encounter (HOSPITAL_COMMUNITY): Payer: Self-pay | Admitting: Gastroenterology

## 2023-02-21 LAB — SURGICAL PATHOLOGY

## 2023-02-22 ENCOUNTER — Encounter (INDEPENDENT_AMBULATORY_CARE_PROVIDER_SITE_OTHER): Payer: Self-pay | Admitting: *Deleted

## 2023-02-22 NOTE — Consult Note (Signed)
[facility] Customer service manager [facility]) Accountable Care Organization (ACO) [facility] Liaison Note  02/22/2023  Cole Brown 01-Jun-1946 [id number]  Location: [facility] RN Hospital Liaison screened the patient remotely at [facility].  Insurance: [facility] HMO   Cole Brown is a 77 y.o. male who is a Primary Care Patient of Elfredia Nevins, MD. The patient was screened for readmission hospitalization with noted medium risk score for unplanned readmission risk with 2 IP/1 ED in 6 months.  The patient was assessed for potential [facility]) Care Management service needs for post hospital transition for care coordination. Review of patient's electronic medical record reveals patient was admitted for gastrointestinal hematoma. No anticipated needs at the time of discharge.  Plan: [facility] Liaison will continue to follow progress and disposition to asess for post hospital community care coordination/management needs.  Referral request for community care coordination: pending disposition.   [facility] Care Management/Population Health does not replace or interfere with any arrangements made by the Inpatient Transition of Care team.   For questions contact:   Elliot Cousin, RN, [facility] Liaison [place]   Population Health Office Hours MTWF  8:00 am-6:00 pm [phone] mobile [phone] [Office toll free line] Office Hours are M-F 8:30 - 5 pm [email or web address]

## 2023-02-25 DIAGNOSIS — H353211 Exudative age-related macular degeneration, right eye, with active choroidal neovascularization: Secondary | ICD-10-CM | POA: Diagnosis not present

## 2023-02-26 ENCOUNTER — Ambulatory Visit (INDEPENDENT_AMBULATORY_CARE_PROVIDER_SITE_OTHER): Payer: Medicare HMO | Admitting: Gastroenterology

## 2023-02-26 ENCOUNTER — Encounter (INDEPENDENT_AMBULATORY_CARE_PROVIDER_SITE_OTHER): Payer: Self-pay | Admitting: Gastroenterology

## 2023-02-26 VITALS — BP 112/62 | HR 66 | Temp 98.4°F | Ht 71.0 in | Wt 180.8 lb

## 2023-02-26 DIAGNOSIS — I10 Essential (primary) hypertension: Secondary | ICD-10-CM | POA: Diagnosis not present

## 2023-02-26 DIAGNOSIS — D649 Anemia, unspecified: Secondary | ICD-10-CM

## 2023-02-26 DIAGNOSIS — K649 Unspecified hemorrhoids: Secondary | ICD-10-CM | POA: Diagnosis not present

## 2023-02-26 DIAGNOSIS — Z8711 Personal history of peptic ulcer disease: Secondary | ICD-10-CM

## 2023-02-26 DIAGNOSIS — Z8719 Personal history of other diseases of the digestive system: Secondary | ICD-10-CM | POA: Diagnosis not present

## 2023-02-26 DIAGNOSIS — Z862 Personal history of diseases of the blood and blood-forming organs and certain disorders involving the immune mechanism: Secondary | ICD-10-CM

## 2023-02-26 DIAGNOSIS — K625 Hemorrhage of anus and rectum: Secondary | ICD-10-CM | POA: Diagnosis not present

## 2023-02-26 DIAGNOSIS — K922 Gastrointestinal hemorrhage, unspecified: Secondary | ICD-10-CM | POA: Diagnosis not present

## 2023-02-26 MED ORDER — SUTAB 1479-225-188 MG PO TABS: ORAL_TABLET | ORAL | 0 refills | Status: DC

## 2023-02-26 NOTE — Progress Notes (Signed)
[name] , M.D. Gastroenterology & Hepatology [facility] [address] [place], [place] [address] Primary Care Physician: Elfredia Nevins, MD [address] [place] [address]  Chief Complaint: History of recurrent sigmoid diverticulitis with colovesical fistula, recent hospitalization for peptic ulcer disease (Forrest Ia)  History of Present Illness:  Cole Brown is a 77 y.o. year old male with medical history significant for hypertension, inflammatory arthritis, fibromyalgia with [name] phenomenon, IDA, recent complicated diverticulitis with possible contained perforation, here for follow-up of symptomatic anemia with recurrent sigmoid diverticulitis with colovesical fistula, recent hospitalization for peptic ulcer disease (Forrest Ia)  Patient with recent hospitalization which was complicated by episodes of hematemesis and persistent nausea which melena while attempting to take bowel prep (he was not able to tolerate the prep).  He presented worsening anemia after bleeding episodes, for which he underwent an EGD and a flexible sigmoidoscopy yesterday.  EGD was remarkable for the presence of blood in the stomach, as well as multiple small gastric ulcers, there was presence of large duodenal ulcerations with largest measuring 20-30 mm and with presence of spurting vessel which required injection of adrenaline, ablation and clipping x1.  flexible sigmoidoscopy was difficult to perform given amount of stool.    Today the patient and patient wife reports his stools are brown and formed no black color stools or blood in stool.  Although patient took  ' Bunch of aspirin" couple days ago because of chronic pain. currently is taking Tylenol  Last EGD:  - 1 cm hiatal hernia. - Red blood in the gastric body. Fluid aspiration performed. - Non- bleeding gastric ulcers with a clean ulcer base ( Forrest Class III) . Biopsied. - Non-  obstructing spurting duodenal ulcer with spurting hemorrhage ( Forrest Class Ia) . Injected. Treated with bipolar cautery. Clip was placed. Clip manufacturer: [employer]. - Non- bleeding duodenal ulcer with a clean ulcer base ( Forrest Class III) .  A. GASTRIC, BIOPSY:      Gastric antral mucosa with focal mild chronic inactive gastritis and reactive epithelial changes.      Gastric oxyntic mucosa with no significant diagnostic alteration No H.pylori identified on HE stain. Negative for intestinal metaplasia or dysplasia  Last Colonoscopy:august 2016 with multiple diverticula at sigmoid colon and external hemorrhoids   FHx: neg for any gastrointestinal/liver disease, no malignancies Social: neg smoking, alcohol or illicit drug use   Past Medical History: Past Medical History:  Diagnosis Date   Diverticula of colon    2016 colonoscopy   Fibromyalgia    Fibromyalgia    History of GI diverticular bleed    Hypercholesteremia    Hypertension     Past Surgical History: Past Surgical History:  Procedure Laterality Date   allergy shots  weekly   BIOPSY  02/15/2023   Procedure: BIOPSY;  Surgeon: Dolores Frame, MD;  [facility];  Service: Gastroenterology;;   CHOLECYSTECTOMY     COLONOSCOPY     Dr.Rehman   COLONOSCOPY N/A 01/27/2015   Rehman: multiple diverticula at sigmoid colon,ext hemorrhoids   COLONOSCOPY WITH PROPOFOL N/A 02/15/2023   Procedure: COLONOSCOPY WITH PROPOFOL;  Surgeon: Dolores Frame, MD;  [facility];  Service: Gastroenterology;  Laterality: N/A;   ESOPHAGOGASTRODUODENOSCOPY (EGD) WITH PROPOFOL N/A 02/15/2023   Procedure: ESOPHAGOGASTRODUODENOSCOPY (EGD) WITH PROPOFOL;  Surgeon: Dolores Frame, MD;  [facility];  Service: Gastroenterology;  Laterality: N/A;   EYE SURGERY Bilateral    partial  cornea transplants    GALLBLADDER SURGERY  12/1993   HOT HEMOSTASIS  02/15/2023   Procedure: HOT  HEMOSTASIS (ARGON PLASMA COAGULATION/BICAP);  Surgeon: [name], [name], MD;  [facility];  Service: Gastroenterology;;   Susa Day  02/15/2023   Procedure: Susa Day;  Surgeon: [name], [name], MD;  [facility];  Service: Gastroenterology;;   UPPER GASTROINTESTINAL ENDOSCOPY      Family History: Family History  Problem Relation Age of Onset   Heart disease Mother    Pancreatic cancer Mother    Prostate cancer Father    Healthy Sister    Parkinson's disease Brother    Asthma Sister    Healthy Sister     Social History: Social History   Tobacco Use  Smoking Status Never   Passive exposure: Never  Smokeless Tobacco Never   Social History   Substance and Sexual Activity  Alcohol Use Not Currently   Comment: occas   Social History   Substance and Sexual Activity  Drug Use No    Allergies: Allergies  Allergen Reactions   Bee Pollen     Seasonal allergies   Plaquenil [Hydroxychloroquine Sulfate]     rash   Lodine [Etodolac] Swelling and Rash    Medications: Current Outpatient Medications  Medication Sig Dispense Refill   acetaminophen (TYLENOL 8 HOUR ARTHRITIS PAIN) 650 MG CR tablet Take 650 mg by mouth every 8 (eight) hours as needed for pain.     amLODipine (NORVASC) 5 MG tablet Take 1 tablet (5 mg total) by mouth daily. 90 tablet 3   ampicillin (PRINCIPEN) 500 MG capsule Take 500 mg by mouth 3 (three) times daily.     atenolol (TENORMIN) 25 MG tablet TAKE (1/2) TABLET BY MOUTH ONCE DAILY. (Patient taking differently: Take 12.5 mg by mouth daily.) 45 tablet 2   azelastine (ASTELIN) 0.1 % nasal spray Place 1 spray into both nostrils 2 (two) times daily.     Cholecalciferol (VITAMIN D) 50 MCG (2000 UT) CAPS Take 2,000 Units by mouth daily.     diphenhydrAMINE (BENADRYL) 25 MG tablet Take 25 mg by mouth as needed for allergies.      EPINEPHrine 0.3 mg/0.3 mL IJ SOAJ injection Inject 0.3 mg into the muscle as needed  for anaphylaxis.     famotidine (PEPCID) 20 MG tablet Take 1 tablet (20 mg total) by mouth at bedtime. Takes as needed 30 tablet 1   Faricimab-svoa (VABYSMO IZ) by Intravitreal route. Monthly     FLUoxetine (PROZAC) 10 MG tablet Take 10 mg by mouth daily.     fluticasone (FLONASE) 50 MCG/ACT nasal spray Place 1-2 sprays into both nostrils daily.     folic acid (FOLVITE) 1 MG tablet TAKE 2 TABLETS BY MOUTH DAILY. (Patient taking differently: Take 2 mg by mouth daily.) 180 tablet 3   Investigational - Study Medication Astrovasatin study med.     levocetirizine (XYZAL) 5 MG tablet Take 5 mg by mouth every evening.      methotrexate (RHEUMATREX) 2.5 MG tablet Take 8 tablets (20 mg total) by mouth once a week. 96 tablet 0   montelukast (SINGULAIR) 10 MG tablet Take 10 mg by mouth at bedtime.     Multiple Vitamins-Minerals (ICAPS AREDS 2 PO) Take 1 capsule by mouth daily.     Omega-3 Fatty Acids (FISH OIL) 1000 MG CAPS Take 1 capsule by mouth daily.     OVER THE COUNTER MEDICATION Prep h ointment and cream as needed  pantoprazole (PROTONIX) 40 MG tablet Take 1 tablet (40 mg total) by mouth 2 (two) times daily. 60 tablet 2   sucralfate (CARAFATE) 1 GM/10ML suspension Take 10 mLs (1 g total) by mouth 4 (four) times daily -  with meals and at bedtime for 21 days. 840 mL 0   Testosterone (ANDROGEL TD) Place onto the skin daily.     triazolam (HALCION) 0.25 MG tablet Take 0.25 mg by mouth at bedtime.      No current facility-administered medications for this visit.    Review of Systems: GENERAL: negative for malaise, night sweats HEENT: No changes in hearing or vision, no nose bleeds or other nasal problems. NECK: Negative for lumps, goiter, pain and significant neck swelling RESPIRATORY: Negative for cough, wheezing CARDIOVASCULAR: Negative for chest pain, leg swelling, palpitations, orthopnea GI: SEE HPI MUSCULOSKELETAL: Negative for joint pain or swelling, back pain, and muscle pain. SKIN:  Negative for lesions, rash HEMATOLOGY Negative for prolonged bleeding, bruising easily, and swollen nodes. ENDOCRINE: Negative for cold or heat intolerance, polyuria, polydipsia and goiter. NEURO: negative for tremor, gait imbalance, syncope and seizures. The remainder of the review of systems is noncontributory.   Physical Exam: BP 112/62 (BP Location: Left Arm, Patient Position: Sitting, Cuff Size: Normal)   Pulse 66   Temp 98.4 F (36.9 C) (Oral)   Ht 5\' 11"  (1.803 m)   Wt 180 lb 12.8 oz (82 kg)   BMI 25.22 kg/m  GENERAL: The patient is AO x3, in no acute distress. HEENT: Head is normocephalic and atraumatic. EOMI are intact. Mouth is well hydrated and without lesions. NECK: Supple. No masses LUNGS: Clear to auscultation. No presence of rhonchi/wheezing/rales. Adequate chest expansion HEART: RRR, normal s1 and s2. ABDOMEN: Soft, nontender, no guarding, no peritoneal signs, and nondistended. BS +. No masses. EXTREMITIES: Without any cyanosis, clubbing, rash, lesions or edema. NEUROLOGIC: AOx3, no focal motor deficit. SKIN: no jaundice, no rashes   Imaging/Labs: as above     Latest Ref Rng & Units 02/18/2023    5:11 AM 02/17/2023    5:26 AM 02/16/2023    3:36 AM  CBC  WBC 4.0 - 10.5 K/uL 9.2  8.6  7.6   Hemoglobin 13.0 - 17.0 g/dL 7.8  7.6  7.3   Hematocrit 39.0 - 52.0 % 23.8  23.6  22.5   Platelets 150 - 400 K/uL 205  167  181    Lab Results  Component Value Date   IRON 16 (L) 02/12/2023   TIBC 212 (L) 02/12/2023   FERRITIN 109 02/12/2023    I personally reviewed and interpreted the available labs, imaging and endoscopic files.  Impression and Plan:  CARDERO QUIZHPI is a 77 y.o. year old male with medical history significant for hypertension, inflammatory arthritis, fibromyalgia with [name] phenomenon, IDA, recent complicated diverticulitis with possible contained perforation, here for follow-up of symptomatic anemia with recurrent sigmoid diverticulitis with  colovesical fistula, recent hospitalization for peptic ulcer disease (Forrest Ia)  Recent hospitalization was complicated by episodes of hematemesis and persistent nausea which melena while attempting to take bowel prep (he was not able to tolerate the prep).  He presented worsening anemia after bleeding episodes, for which he underwent an EGD and a flexible sigmoidoscopy .  EGD was remarkable for the presence of blood in the stomach, as well as multiple small gastric ulcers, there was presence of large duodenal ulcerations with largest measuring 20-30 mm and with presence of spurting vessel which required injection of adrenaline, ablation  and clipping x1.  flexible sigmoidoscopy was difficult to perform given amount of stool.  # Symptomatic anemia # Recurrent sigmoid Diverticulitis #Colovesicle fistula  Needs colonoscopy to rule out underlying malignancy ASAP Colorectal surgery and urology referral, appointment after colonoscopy  Patient was not able to tolerate liquid prep twice in hospital and hence at this time we will give him Sutab's  Will repeat CBC and ferritin today to assess degree of anemia if less than 7 would need blood transfusion  # Peptic ulcer disease  Patient had multiple Forrest 3 gastric ulcers and 1 large duodenal Forrest Ia  This is likely due to NSAID induced.  He agreed to " bunch of aspirin" written discharge  Recs: -Recommend to take only Tylenol less than 3 g a day - Pantoprazole 40 mg twice daily dosing for 3 months - Sucralfate 1 every 6 hours for 1 month - No high dose aspirin, ibuprofen, naproxen, or other non- steroidal anti- inflammatory drugs indefinitely. -Repeat upper endoscopy in 3 months   All questions were answered.      [name], MD Gastroenterology and Hepatology [facility]   This chart has been completed using [facility] Dictation software, and while attempts have been made to ensure accuracy  , certain words and phrases may not be transcribed as intended

## 2023-02-26 NOTE — Patient Instructions (Addendum)
It was very nice to meet you today, as dicussed with will plan for the following :  1) referral to colorectal surgeon and urology  2)Pantoprazole 40 mg twice daily dosing for 3 months - No high dose aspirin, ibuprofen, naproxen, or other non- steroidal anti- inflammatory drugs indefinitely.  3) colonoscopy

## 2023-02-26 NOTE — Addendum Note (Signed)
Addended by: Marlowe Shores on: 02/26/2023 10:27 AM   Modules accepted: Orders

## 2023-02-26 NOTE — H&P (View-Only) (Signed)
Cole Brown , M.D. Gastroenterology & Hepatology [facility] [address] [place], [place] [address] Primary Care Physician: Elfredia Nevins, MD [address] [place] [address]  Chief Complaint: History of recurrent sigmoid diverticulitis with colovesical fistula, recent hospitalization for peptic ulcer disease (Forrest Ia)  History of Present Illness:  Cole Brown is a 77 y.o. year old male with medical history significant for hypertension, inflammatory arthritis, fibromyalgia with [name] phenomenon, IDA, recent complicated diverticulitis with possible contained perforation, here for follow-up of symptomatic anemia with recurrent sigmoid diverticulitis with colovesical fistula, recent hospitalization for peptic ulcer disease (Forrest Ia)  Patient with recent hospitalization which was complicated by episodes of hematemesis and persistent nausea which melena while attempting to take bowel prep (he was not able to tolerate the prep).  He presented worsening anemia after bleeding episodes, for which he underwent an EGD and a flexible sigmoidoscopy yesterday.  EGD was remarkable for the presence of blood in the stomach, as well as multiple small gastric ulcers, there was presence of large duodenal ulcerations with largest measuring 20-30 mm and with presence of spurting vessel which required injection of adrenaline, ablation and clipping x1.  flexible sigmoidoscopy was difficult to perform given amount of stool.    Today the patient and patient wife reports his stools are brown and formed no black color stools or blood in stool.  Although patient took  ' Bunch of aspirin" couple days ago because of chronic pain. currently is taking Tylenol  Last EGD:  - 1 cm hiatal hernia. - Red blood in the gastric body. Fluid aspiration performed. - Non- bleeding gastric ulcers with a clean ulcer base ( Forrest Class III) . Biopsied. - Non-  obstructing spurting duodenal ulcer with spurting hemorrhage ( Forrest Class Ia) . Injected. Treated with bipolar cautery. Clip was placed. Clip manufacturer: [employer]. - Non- bleeding duodenal ulcer with a clean ulcer base ( Forrest Class III) .  A. GASTRIC, BIOPSY:      Gastric antral mucosa with focal mild chronic inactive gastritis and reactive epithelial changes.      Gastric oxyntic mucosa with no significant diagnostic alteration No H.pylori identified on HE stain. Negative for intestinal metaplasia or dysplasia  Last Colonoscopy:august 2016 with multiple diverticula at sigmoid colon and external hemorrhoids   FHx: neg for any gastrointestinal/liver disease, no malignancies Social: neg smoking, alcohol or illicit drug use   Past Medical History: Past Medical History:  Diagnosis Date   Diverticula of colon    2016 colonoscopy   Fibromyalgia    Fibromyalgia    History of GI diverticular bleed    Hypercholesteremia    Hypertension     Past Surgical History: Past Surgical History:  Procedure Laterality Date   allergy shots  weekly   BIOPSY  02/15/2023   Procedure: BIOPSY;  Surgeon: Dolores Frame, MD;  [facility]: [facility];  Service: Gastroenterology;;   CHOLECYSTECTOMY     COLONOSCOPY     Dr.Rehman   COLONOSCOPY N/A 01/27/2015   Rehman: multiple diverticula at sigmoid colon,ext hemorrhoids   COLONOSCOPY WITH PROPOFOL N/A 02/15/2023   Procedure: COLONOSCOPY WITH PROPOFOL;  Surgeon: Dolores Frame, MD;  [facility]: [facility];  Service: Gastroenterology;  Laterality: N/A;   ESOPHAGOGASTRODUODENOSCOPY (EGD) WITH PROPOFOL N/A 02/15/2023   Procedure: ESOPHAGOGASTRODUODENOSCOPY (EGD) WITH PROPOFOL;  Surgeon: Dolores Frame, MD;  [facility]: [facility];  Service: Gastroenterology;  Laterality: N/A;   EYE SURGERY Bilateral    partial  cornea transplants    GALLBLADDER SURGERY  12/1993   HOT HEMOSTASIS  02/15/2023   Procedure: HOT  HEMOSTASIS (ARGON PLASMA COAGULATION/BICAP);  Surgeon: [name], [name], MD;  Location: [facility];  Service: Gastroenterology;;   Susa Day  02/15/2023   Procedure: Susa Day;  Surgeon: [name], [name], MD;  Location: [facility];  Service: Gastroenterology;;   UPPER GASTROINTESTINAL ENDOSCOPY      Family History: Family History  Problem Relation Age of Onset   Heart disease Mother    Pancreatic cancer Mother    Prostate cancer Father    Healthy Sister    Parkinson's disease Brother    Asthma Sister    Healthy Sister     Social History: Social History   Tobacco Use  Smoking Status Never   Passive exposure: Never  Smokeless Tobacco Never   Social History   Substance and Sexual Activity  Alcohol Use Not Currently   Comment: occas   Social History   Substance and Sexual Activity  Drug Use No    Allergies: Allergies  Allergen Reactions   Bee Pollen     Seasonal allergies   Plaquenil [Hydroxychloroquine Sulfate]     rash   Lodine [Etodolac] Swelling and Rash    Medications: Current Outpatient Medications  Medication Sig Dispense Refill   acetaminophen (TYLENOL 8 HOUR ARTHRITIS PAIN) 650 MG CR tablet Take 650 mg by mouth every 8 (eight) hours as needed for pain.     amLODipine (NORVASC) 5 MG tablet Take 1 tablet (5 mg total) by mouth daily. 90 tablet 3   ampicillin (PRINCIPEN) 500 MG capsule Take 500 mg by mouth 3 (three) times daily.     atenolol (TENORMIN) 25 MG tablet TAKE (1/2) TABLET BY MOUTH ONCE DAILY. (Patient taking differently: Take 12.5 mg by mouth daily.) 45 tablet 2   azelastine (ASTELIN) 0.1 % nasal spray Place 1 spray into both nostrils 2 (two) times daily.     Cholecalciferol (VITAMIN D) 50 MCG (2000 UT) CAPS Take 2,000 Units by mouth daily.     diphenhydrAMINE (BENADRYL) 25 MG tablet Take 25 mg by mouth as needed for allergies.      EPINEPHrine 0.3 mg/0.3 mL IJ SOAJ injection Inject 0.3 mg into the muscle as needed  for anaphylaxis.     famotidine (PEPCID) 20 MG tablet Take 1 tablet (20 mg total) by mouth at bedtime. Takes as needed 30 tablet 1   Faricimab-svoa (VABYSMO IZ) by Intravitreal route. Monthly     FLUoxetine (PROZAC) 10 MG tablet Take 10 mg by mouth daily.     fluticasone (FLONASE) 50 MCG/ACT nasal spray Place 1-2 sprays into both nostrils daily.     folic acid (FOLVITE) 1 MG tablet TAKE 2 TABLETS BY MOUTH DAILY. (Patient taking differently: Take 2 mg by mouth daily.) 180 tablet 3   Investigational - Study Medication Astrovasatin study med.     levocetirizine (XYZAL) 5 MG tablet Take 5 mg by mouth every evening.      methotrexate (RHEUMATREX) 2.5 MG tablet Take 8 tablets (20 mg total) by mouth once a week. 96 tablet 0   montelukast (SINGULAIR) 10 MG tablet Take 10 mg by mouth at bedtime.     Multiple Vitamins-Minerals (ICAPS AREDS 2 PO) Take 1 capsule by mouth daily.     Omega-3 Fatty Acids (FISH OIL) 1000 MG CAPS Take 1 capsule by mouth daily.     OVER THE COUNTER MEDICATION Prep h ointment and cream as needed  pantoprazole (PROTONIX) 40 MG tablet Take 1 tablet (40 mg total) by mouth 2 (two) times daily. 60 tablet 2   sucralfate (CARAFATE) 1 GM/10ML suspension Take 10 mLs (1 g total) by mouth 4 (four) times daily -  with meals and at bedtime for 21 days. 840 mL 0   Testosterone (ANDROGEL TD) Place onto the skin daily.     triazolam (HALCION) 0.25 MG tablet Take 0.25 mg by mouth at bedtime.      No current facility-administered medications for this visit.    Review of Systems: GENERAL: negative for malaise, night sweats HEENT: No changes in hearing or vision, no nose bleeds or other nasal problems. NECK: Negative for lumps, goiter, pain and significant neck swelling RESPIRATORY: Negative for cough, wheezing CARDIOVASCULAR: Negative for chest pain, leg swelling, palpitations, orthopnea GI: SEE HPI MUSCULOSKELETAL: Negative for joint pain or swelling, back pain, and muscle pain. SKIN:  Negative for lesions, rash HEMATOLOGY Negative for prolonged bleeding, bruising easily, and swollen nodes. ENDOCRINE: Negative for cold or heat intolerance, polyuria, polydipsia and goiter. NEURO: negative for tremor, gait imbalance, syncope and seizures. The remainder of the review of systems is noncontributory.   Physical Exam: BP 112/62 (BP Location: Left Arm, Patient Position: Sitting, Cuff Size: Normal)   Pulse 66   Temp 98.4 F (36.9 C) (Oral)   Ht 5\' 11"  (1.803 m)   Wt 180 lb 12.8 oz (82 kg)   BMI 25.22 kg/m  GENERAL: The patient is AO x3, in no acute distress. HEENT: Head is normocephalic and atraumatic. EOMI are intact. Mouth is well hydrated and without lesions. NECK: Supple. No masses LUNGS: Clear to auscultation. No presence of rhonchi/wheezing/rales. Adequate chest expansion HEART: RRR, normal s1 and s2. ABDOMEN: Soft, nontender, no guarding, no peritoneal signs, and nondistended. BS +. No masses. EXTREMITIES: Without any cyanosis, clubbing, rash, lesions or edema. NEUROLOGIC: AOx3, no focal motor deficit. SKIN: no jaundice, no rashes   Imaging/Labs: as above     Latest Ref Rng & Units 02/18/2023    5:11 AM 02/17/2023    5:26 AM 02/16/2023    3:36 AM  CBC  WBC 4.0 - 10.5 K/uL 9.2  8.6  7.6   Hemoglobin 13.0 - 17.0 g/dL 7.8  7.6  7.3   Hematocrit 39.0 - 52.0 % 23.8  23.6  22.5   Platelets 150 - 400 K/uL 205  167  181    Lab Results  Component Value Date   IRON 16 (L) 02/12/2023   TIBC 212 (L) 02/12/2023   FERRITIN 109 02/12/2023    I personally reviewed and interpreted the available labs, imaging and endoscopic files.  Impression and Plan:  Cole Brown is a 77 y.o. year old male with medical history significant for hypertension, inflammatory arthritis, fibromyalgia with [name] phenomenon, IDA, recent complicated diverticulitis with possible contained perforation, here for follow-up of symptomatic anemia with recurrent sigmoid diverticulitis with  colovesical fistula, recent hospitalization for peptic ulcer disease (Forrest Ia)  Recent hospitalization was complicated by episodes of hematemesis and persistent nausea which melena while attempting to take bowel prep (he was not able to tolerate the prep).  He presented worsening anemia after bleeding episodes, for which he underwent an EGD and a flexible sigmoidoscopy .  EGD was remarkable for the presence of blood in the stomach, as well as multiple small gastric ulcers, there was presence of large duodenal ulcerations with largest measuring 20-30 mm and with presence of spurting vessel which required injection of adrenaline, ablation  and clipping x1.  flexible sigmoidoscopy was difficult to perform given amount of stool.  # Symptomatic anemia # Recurrent sigmoid Diverticulitis #Colovesicle fistula  Needs colonoscopy to rule out underlying malignancy ASAP Colorectal surgery and urology referral, appointment after colonoscopy  Patient was not able to tolerate liquid prep twice in hospital and hence at this time we will give him Sutab's  Will repeat CBC and ferritin today to assess degree of anemia if less than 7 would need blood transfusion  # Peptic ulcer disease  Patient had multiple Forrest 3 gastric ulcers and 1 large duodenal Forrest Ia  This is likely due to NSAID induced.  He agreed to " bunch of aspirin" written discharge  Recs: -Recommend to take only Tylenol less than 3 g a day - Pantoprazole 40 mg twice daily dosing for 3 months - Sucralfate 1 every 6 hours for 1 month - No high dose aspirin, ibuprofen, naproxen, or other non- steroidal anti- inflammatory drugs indefinitely. -Repeat upper endoscopy in 3 months   All questions were answered.      Cole Lawman, MD Gastroenterology and Hepatology [facility]   This chart has been completed using [employer] [facility] [employer] Dictation software, and while attempts have been made to ensure accuracy  , certain words and phrases may not be transcribed as intended

## 2023-02-27 LAB — CBC
Hematocrit: 26.3 % — ABNORMAL LOW (ref 37.5–51.0)
Hemoglobin: 7.8 g/dL — ABNORMAL LOW (ref 13.0–17.7)
MCH: 28 pg (ref 26.6–33.0)
MCHC: 29.7 g/dL — ABNORMAL LOW (ref 31.5–35.7)
MCV: 94 fL (ref 79–97)
Platelets: 424 10*3/uL (ref 150–450)
RBC: 2.79 x10E6/uL — ABNORMAL LOW (ref 4.14–5.80)
RDW: 16.8 % — ABNORMAL HIGH (ref 11.6–15.4)
WBC: 6.9 10*3/uL (ref 3.4–10.8)

## 2023-02-27 LAB — FERRITIN: Ferritin: 93 ng/mL (ref 30–400)

## 2023-02-28 DIAGNOSIS — Z6825 Body mass index (BMI) 25.0-25.9, adult: Secondary | ICD-10-CM | POA: Diagnosis not present

## 2023-02-28 DIAGNOSIS — E860 Dehydration: Secondary | ICD-10-CM | POA: Diagnosis not present

## 2023-02-28 DIAGNOSIS — E663 Overweight: Secondary | ICD-10-CM | POA: Diagnosis not present

## 2023-02-28 DIAGNOSIS — K5792 Diverticulitis of intestine, part unspecified, without perforation or abscess without bleeding: Secondary | ICD-10-CM | POA: Diagnosis not present

## 2023-02-28 DIAGNOSIS — K625 Hemorrhage of anus and rectum: Secondary | ICD-10-CM | POA: Diagnosis not present

## 2023-03-04 ENCOUNTER — Telehealth (INDEPENDENT_AMBULATORY_CARE_PROVIDER_SITE_OTHER): Payer: Self-pay | Admitting: Gastroenterology

## 2023-03-04 NOTE — Telephone Encounter (Signed)
Hi Cole Brown .   Patient is taking significant amount of tylenol . He should limit tylenol to less than 3000mg /day . It seems from the message that he has generalized pain , if this continues patient is advise to be examined by a Doctor , either with PCP or ED.

## 2023-03-04 NOTE — Telephone Encounter (Signed)
Pt spouse left message on voicemail Friday at 10:40 am. Pt spouse states  that pt is in unbearable pain. Has been in bathroom with loose stools. No vomiting as wife is aware. No fever. Pt has been at 4000 grams of Tylenol. Pt wife states that sometimes he will eat sometimes he will tell spouse its to bitter, to salty or not hungry Pain is located "everywhere". Spouse states that several years ago a physician told him it was fibromyalgia.  Pt took 5 tylenol yesterday morning and by 9 am he was in bed. Pt then took 3 more at bedtime. Spouse is wanting to know what else they can do. Please advise. Thank you

## 2023-03-04 NOTE — Telephone Encounter (Signed)
Pt wife Annice Pih contacted and verbalized understanding. Pt is seeing [facility] this Wednesday. Pt wife states that pt is wanting more Tylenol. Advised spouse of provider recommendations.

## 2023-03-06 DIAGNOSIS — M2550 Pain in unspecified joint: Secondary | ICD-10-CM | POA: Diagnosis not present

## 2023-03-06 DIAGNOSIS — G8929 Other chronic pain: Secondary | ICD-10-CM | POA: Diagnosis not present

## 2023-03-06 DIAGNOSIS — M353 Polymyalgia rheumatica: Secondary | ICD-10-CM | POA: Diagnosis not present

## 2023-03-08 NOTE — Patient Instructions (Signed)
Cole Brown  03/08/2023     @PREFPERIOPPHARMACY @   Your procedure is scheduled on  03/14/2023.   Report to [facility] at  1200  P.M.   Call this number if you have problems the morning of surgery:  [phone]  If you experience any cold or flu symptoms such as cough, fever, chills, shortness of breath, etc. between now and your scheduled surgery, please notify us at the above number.   Remember:  Follow the diet and prep instructions given to you by the office.     Take these medicines the morning of surgery with A SIP OF WATER               amlodipine, atenolol, prozac, pantoprazole.     Do not wear jewelry, make-up or nail polish, including gel polish,  artificial nails, or any other type of covering on natural nails (fingers and  toes).  Do not wear lotions, powders, or perfumes, or deodorant.  Do not shave 48 hours prior to surgery.  Men may shave face and neck.  Do not bring valuables to the hospital.  [facility] is not responsible for any belongings or valuables.  Contacts, dentures or bridgework may not be worn into surgery.  Leave your suitcase in the car.  After surgery it may be brought to your room.  For patients admitted to the hospital, discharge time will be determined by your treatment team.  Patients discharged the day of surgery will not be allowed to drive home and must have someone with them for 24 hours.    Special instructions:   DO NOT smoke tobacco or vape for 24 hours before your procedure.  Please read over the following fact sheets that you were given. Anesthesia Post-op Instructions and Care and Recovery After Surgery      Colonoscopy, Adult, Care After The following information offers guidance on how to care for yourself after your procedure. Your health care provider may also give you more specific instructions. If you have problems or questions, contact your health care provider. What can I expect after the  procedure? After the procedure, it is common to have: A small amount of blood in your stool for 24 hours after the procedure. Some gas. Mild cramping or bloating of your abdomen. Follow these instructions at home: Eating and drinking  Drink enough fluid to keep your urine pale yellow. Follow instructions from your health care provider about eating or drinking restrictions. Resume your normal diet as told by your health care provider. Avoid heavy or fried foods that are hard to digest. Activity Rest as told by your health care provider. Avoid sitting for a long time without moving. Get up to take short walks every 1-2 hours. This is important to improve blood flow and breathing. Ask for help if you feel weak or unsteady. Return to your normal activities as told by your health care provider. Ask your health care provider what activities are safe for you. Managing cramping and bloating  Try walking around when you have cramps or feel bloated. If directed, apply heat to your abdomen as told by your health care provider. Use the heat source that your health care provider recommends, such as a moist heat pack or a heating pad. Place a towel between your skin and the heat source. Leave the heat on for 20-30 minutes. Remove the heat if your skin turns bright red. This is especially important if you are unable  to feel pain, heat, or cold. You have a greater risk of getting burned. General instructions If you were given a sedative during the procedure, it can affect you for several hours. Do not drive or operate machinery until your health care provider says that it is safe. For the first 24 hours after the procedure: Do not sign important documents. Do not drink alcohol. Do your regular daily activities at a slower pace than normal. Eat soft foods that are easy to digest. Take over-the-counter and prescription medicines only as told by your health care provider. Keep all follow-up visits. This  is important. Contact a health care provider if: You have blood in your stool 2-3 days after the procedure. Get help right away if: You have more than a small spotting of blood in your stool. You have large blood clots in your stool. You have swelling of your abdomen. You have nausea or vomiting. You have a fever. You have increasing pain in your abdomen that is not relieved with medicine. These symptoms may be an emergency. Get help right away. Call 911. Do not wait to see if the symptoms will go away. Do not drive yourself to the hospital. Summary After the procedure, it is common to have a small amount of blood in your stool. You may also have mild cramping and bloating of your abdomen. If you were given a sedative during the procedure, it can affect you for several hours. Do not drive or operate machinery until your health care provider says that it is safe. Get help right away if you have a lot of blood in your stool, nausea or vomiting, a fever, or increased pain in your abdomen. This information is not intended to replace advice given to you by your health care provider. Make sure you discuss any questions you have with your health care provider. Document Revised: 07/17/2022 Document Reviewed: 01/25/2021 Elsevier Patient Education  2024 [employer] Monitored Anesthesia Care, Care After The following information offers guidance on how to care for yourself after your procedure. Your health care provider may also give you more specific instructions. If you have problems or questions, contact your health care provider. What can I expect after the procedure? After the procedure, it is common to have: Tiredness. Little or no memory about what happened during or after the procedure. Impaired judgment when it comes to making decisions. Nausea or vomiting. Some trouble with balance. Follow these instructions at home: For the time period you were told by your health care  provider:  Rest. Do not participate in activities where you could fall or become injured. Do not drive or use machinery. Do not drink alcohol. Do not take sleeping pills or medicines that cause drowsiness. Do not make important decisions or sign legal documents. Do not take care of children on your own. Medicines Take over-the-counter and prescription medicines only as told by your health care provider. If you were prescribed antibiotics, take them as told by your health care provider. Do not stop using the antibiotic even if you start to feel better. Eating and drinking Follow instructions from your health care provider about what you may eat and drink. Drink enough fluid to keep your urine pale yellow. If you vomit: Drink clear fluids slowly and in small amounts as you are able. Clear fluids include water, ice chips, low-calorie sports drinks, and fruit juice that has water added to it (diluted fruit juice). Eat light and bland foods in small amounts as you are  able. These foods include bananas, applesauce, rice, lean meats, toast, and crackers. General instructions  Have a responsible adult stay with you for the time you are told. It is important to have someone help care for you until you are awake and alert. If you have sleep apnea, surgery and some medicines can increase your risk for breathing problems. Follow instructions from your health care provider about wearing your sleep device: When you are sleeping. This includes during daytime naps. While taking prescription pain medicines, sleeping medicines, or medicines that make you drowsy. Do not use any products that contain nicotine or tobacco. These products include cigarettes, chewing tobacco, and vaping devices, such as e-cigarettes. If you need help quitting, ask your health care provider. Contact a health care provider if: You feel nauseous or vomit every time you eat or drink. You feel light-headed. You are still sleepy or  having trouble with balance after 24 hours. You get a rash. You have a fever. You have redness or swelling around the IV site. Get help right away if: You have trouble breathing. You have new confusion after you get home. These symptoms may be an emergency. Get help right away. Call 911. Do not wait to see if the symptoms will go away. Do not drive yourself to the hospital. This information is not intended to replace advice given to you by your health care provider. Make sure you discuss any questions you have with your health care provider. Document Revised: 10/30/2021 Document Reviewed: 10/30/2021 Elsevier Patient Education  2024 [employer].

## 2023-03-09 ENCOUNTER — Ambulatory Visit
Admission: EM | Admit: 2023-03-09 | Discharge: 2023-03-09 | Disposition: A | Discharge status: Home / Self Care | Payer: Medicare HMO | Attending: Family Medicine | Admitting: Family Medicine

## 2023-03-09 DIAGNOSIS — R600 Localized edema: Secondary | ICD-10-CM | POA: Diagnosis not present

## 2023-03-09 DIAGNOSIS — D62 Acute posthemorrhagic anemia: Secondary | ICD-10-CM | POA: Diagnosis not present

## 2023-03-09 NOTE — ED Triage Notes (Signed)
Pt reports he has swollen legs x 6 weeks. Pt has a "rash on both legs since this morning.   Pt is taking 4000 mg of tylenol a day for his fibromyalgia

## 2023-03-09 NOTE — Discharge Instructions (Signed)
Try wrapping the legs in Ace wrap for compression daily to see if this helps with the edema since you are intolerant to the pressure of compression stockings at this time.  Elevate your legs at rest to help with the swelling further.  We have obtained a CBC today to check your blood counts.  If your symptoms significantly worsen, go to the emergency department for immediate further evaluation.  Otherwise follow-up with your primary care provider or specialist managing this condition as soon as possible.

## 2023-03-10 ENCOUNTER — Emergency Department (HOSPITAL_COMMUNITY): Payer: Medicare HMO

## 2023-03-10 ENCOUNTER — Emergency Department (HOSPITAL_COMMUNITY)
Admission: EM | Admit: 2023-03-10 | Discharge: 2023-03-10 | Disposition: A | Discharge status: Home / Self Care | Payer: Medicare HMO | Attending: Emergency Medicine | Admitting: Emergency Medicine

## 2023-03-10 ENCOUNTER — Other Ambulatory Visit: Payer: Self-pay

## 2023-03-10 DIAGNOSIS — Z79899 Other long term (current) drug therapy: Secondary | ICD-10-CM | POA: Diagnosis not present

## 2023-03-10 DIAGNOSIS — M7989 Other specified soft tissue disorders: Secondary | ICD-10-CM

## 2023-03-10 DIAGNOSIS — R600 Localized edema: Secondary | ICD-10-CM | POA: Diagnosis present

## 2023-03-10 DIAGNOSIS — K5732 Diverticulitis of large intestine without perforation or abscess without bleeding: Secondary | ICD-10-CM | POA: Insufficient documentation

## 2023-03-10 DIAGNOSIS — D649 Anemia, unspecified: Secondary | ICD-10-CM | POA: Diagnosis not present

## 2023-03-10 DIAGNOSIS — D72829 Elevated white blood cell count, unspecified: Secondary | ICD-10-CM | POA: Insufficient documentation

## 2023-03-10 DIAGNOSIS — N3289 Other specified disorders of bladder: Secondary | ICD-10-CM | POA: Diagnosis not present

## 2023-03-10 DIAGNOSIS — I10 Essential (primary) hypertension: Secondary | ICD-10-CM | POA: Insufficient documentation

## 2023-03-10 LAB — CBC WITH DIFFERENTIAL/PLATELET
Abs Immature Granulocytes: 0.03 10*3/uL (ref 0.00–0.07)
Basophils Absolute: 0.1 10*3/uL (ref 0.0–0.2)
Basophils Absolute: 0.1 10*3/uL (ref 0.0–0.1)
Basophils Relative: 1 %
Basos: 1 %
EOS (ABSOLUTE): 0.4 10*3/uL (ref 0.0–0.4)
Eos: 4 %
Eosinophils Absolute: 0.3 10*3/uL (ref 0.0–0.5)
Eosinophils Relative: 4 %
HCT: 23.2 % — ABNORMAL LOW (ref 39.0–52.0)
Hematocrit: 21.8 % — ABNORMAL LOW (ref 37.5–51.0)
Hemoglobin: 6.7 g/dL — CL (ref 13.0–17.7)
Hemoglobin: 7.2 g/dL — ABNORMAL LOW (ref 13.0–17.0)
Immature Grans (Abs): 0 10*3/uL (ref 0.0–0.1)
Immature Granulocytes: 0 %
Immature Granulocytes: 0 %
Lymphocytes Absolute: 0.9 10*3/uL (ref 0.7–3.1)
Lymphocytes Relative: 8 %
Lymphs Abs: 0.7 10*3/uL (ref 0.7–4.0)
Lymphs: 9 %
MCH: 27 pg (ref 26.6–33.0)
MCH: 27.8 pg (ref 26.0–34.0)
MCHC: 30.7 g/dL — ABNORMAL LOW (ref 31.5–35.7)
MCHC: 31 g/dL (ref 30.0–36.0)
MCV: 88 fL (ref 79–97)
MCV: 89.6 fL (ref 80.0–100.0)
Monocytes Absolute: 0.6 10*3/uL (ref 0.1–0.9)
Monocytes Absolute: 0.6 10*3/uL (ref 0.1–1.0)
Monocytes Relative: 7 %
Monocytes: 6 %
Neutro Abs: 7.1 10*3/uL (ref 1.7–7.7)
Neutrophils Absolute: 7.5 10*3/uL — ABNORMAL HIGH (ref 1.4–7.0)
Neutrophils Relative %: 80 %
Neutrophils: 80 %
Platelets: 270 10*3/uL (ref 150–450)
Platelets: 270 10*3/uL (ref 150–400)
RBC: 2.48 x10E6/uL — CL (ref 4.14–5.80)
RBC: 2.59 MIL/uL — ABNORMAL LOW (ref 4.22–5.81)
RDW: 16.9 % — ABNORMAL HIGH (ref 11.6–15.4)
RDW: 18.6 % — ABNORMAL HIGH (ref 11.5–15.5)
WBC: 9.5 10*3/uL (ref 3.4–10.8)
WBC: 8.8 10*3/uL (ref 4.0–10.5)
nRBC: 0 % (ref 0.0–0.2)

## 2023-03-10 LAB — BRAIN NATRIURETIC PEPTIDE: B Natriuretic Peptide: 236 pg/mL — ABNORMAL HIGH (ref 0.0–100.0)

## 2023-03-10 LAB — COMPREHENSIVE METABOLIC PANEL
ALT: 19 U/L (ref 0–44)
AST: 19 U/L (ref 15–41)
Albumin: 2.4 g/dL — ABNORMAL LOW (ref 3.5–5.0)
Alkaline Phosphatase: 120 U/L (ref 38–126)
Anion gap: 11 (ref 5–15)
BUN: 18 mg/dL (ref 8–23)
CO2: 22 mmol/L (ref 22–32)
Calcium: 8.1 mg/dL — ABNORMAL LOW (ref 8.9–10.3)
Chloride: 99 mmol/L (ref 98–111)
Creatinine, Ser: 0.69 mg/dL (ref 0.61–1.24)
GFR, Estimated: >60 mL/min (ref >60)
Glucose, Bld: 99 mg/dL (ref 70–99)
Potassium: 3.2 mmol/L — ABNORMAL LOW (ref 3.5–5.1)
Sodium: 132 mmol/L — ABNORMAL LOW (ref 135–145)
Total Bilirubin: 0.8 mg/dL (ref 0.3–1.2)
Total Protein: 6.2 g/dL — ABNORMAL LOW (ref 6.5–8.1)

## 2023-03-10 LAB — URINALYSIS, ROUTINE W REFLEX MICROSCOPIC
Bilirubin Urine: NEGATIVE
Glucose, UA: NEGATIVE mg/dL
Ketones, ur: NEGATIVE mg/dL
Nitrite: NEGATIVE
Protein, ur: 30 mg/dL — AB
Specific Gravity, Urine: 1.011 (ref 1.005–1.030)
WBC, UA: >50 WBC/hpf (ref 0–5)
pH: 8 (ref 5.0–8.0)

## 2023-03-10 LAB — PROTIME-INR
INR: 1.2 (ref 0.8–1.2)
Prothrombin Time: 15.5 seconds — ABNORMAL HIGH (ref 11.4–15.2)

## 2023-03-10 LAB — TYPE AND SCREEN
ABO/RH(D): O POS
Antibody Screen: NEGATIVE

## 2023-03-10 NOTE — ED Triage Notes (Signed)
Pt went to UC yesterday and they called pt back and told him to come to ER due to low hgb.

## 2023-03-10 NOTE — ED Provider Notes (Addendum)
[facility]PARTMENT AT [facility] Provider Note   CSN: [id number] Arrival date & time: 03/10/23  1026     History  No chief complaint on file.   Cole Brown is a 77 y.o. male.  Patient seen at urgent care yesterday.  For bilateral leg swelling with some chronic stasis changes to the skin.  They did blood work.  And hemoglobin apparently came back at 6.7 so he was contacted to come in.  The leg swelling is relatively new the skin changes are very new.  But the leg swelling has been going on for several weeks.  Patient never formally diagnosed with right-sided heart failure.  Patient with a recent admission August 27 to September 2.  That was for acute blood loss anemia gastrointestinal hemorrhage in the past.  They tried to do a colonoscopy could not get good prep.  That is planned for the future.  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  Patient denies any blood in his bowel movements.  Denies any vomiting of any blood.  Patient felt completely fine.  Had no symptoms.  They are trying to get some answers on the bilateral leg swelling.  Urgent care recommended that they wrap them.  But that was just done with Ace wrap.  That has not been very helpful.  Or practical patient is never used tobacco products.  Past surgical history significant for gallbladder surgery in 1995 and gallbladder removal.  Patient had colonoscopy with propofol and February 15, 2023.  Has done by Dr. Earmon Phoenix.       Home Medications Prior to Admission medications   Medication Sig Start Date End Date Taking? Authorizing Provider  acetaminophen (TYLENOL 8 HOUR ARTHRITIS PAIN) 650 MG CR tablet Take 650 mg by mouth every 8 (eight) hours as needed for pain.    [provider]  amLODipine (NORVASC) 5 MG tablet Take 1 tablet (5 mg total) by mouth daily. 08/28/22   [name], NP  ampicillin  (PRINCIPEN) 500 MG capsule Take 500 mg by mouth 3 (three) times daily.    [provider]  atenolol (TENORMIN) 25 MG tablet TAKE (1/2) TABLET BY MOUTH ONCE DAILY. Patient taking differently: Take 12.5 mg by mouth daily. 12/03/22   [name], MD  azelastine (ASTELIN) 0.1 % nasal spray Place 1 spray into both nostrils 2 (two) times daily. 09/17/19   [provider]  Cholecalciferol (VITAMIN D) 50 MCG (2000 UT) CAPS Take 2,000 Units by mouth daily.    [provider]  diphenhydrAMINE (BENADRYL) 25 MG tablet Take 25 mg by mouth as needed for allergies.     [provider]  EPINEPHrine 0.3 mg/0.3 mL IJ SOAJ injection Inject 0.3 mg into the muscle as needed for anaphylaxis. 10/14/19   [provider]  famotidine (PEPCID) 20 MG tablet Take 1 tablet (20 mg total) by mouth at bedtime. Takes as needed 02/18/23   [name], MD  Devoria Albe (VABYSMO IZ) by Intravitreal route. Monthly    [provider]  FLUoxetine (PROZAC) 10 MG tablet Take 10 mg by mouth daily.    [provider]  fluticasone (FLONASE) 50 MCG/ACT nasal spray Place 1-2 sprays into both nostrils daily. 07/27/20   [provider]  folic acid (FOLVITE) 1 MG tablet TAKE 2 TABLETS BY MOUTH DAILY. Patient taking differently: Take 2 mg by mouth daily. 10/12/22   Gearldine Bienenstock, PA-C  Investigational -  Study Medication Astrovasatin study med.    [provider]  levocetirizine (XYZAL) 5 MG tablet Take 5 mg by mouth every evening.     [provider]  methotrexate (RHEUMATREX) 2.5 MG tablet Take 8 tablets (20 mg total) by mouth once a week. 10/25/22   Gearldine Bienenstock, PA-C  montelukast (SINGULAIR) 10 MG tablet Take 10 mg by mouth at bedtime.    [provider]  Multiple Vitamins-Minerals (ICAPS AREDS 2 PO) Take 1 capsule by mouth daily.    [provider]  Omega-3 Fatty Acids (FISH OIL) 1000 MG CAPS Take 1 capsule by mouth daily.     [provider]  OVER THE COUNTER MEDICATION Prep h ointment and cream as needed    [provider]  pantoprazole (PROTONIX) 40 MG tablet Take 1 tablet (40 mg total) by mouth 2 (two) times daily. 02/18/23 02/18/24  [name], MD  Sodium Sulfate-Mag Sulfate-KCl (SUTAB) [phone] MG TABS As directed 02/26/23   Franky Macho, MD  sucralfate (CARAFATE) 1 GM/10ML suspension Take 10 mLs (1 g total) by mouth 4 (four) times daily -  with meals and at bedtime for 21 days. 02/18/23 03/11/23  [name], MD  Testosterone (ANDROGEL TD) Place onto the skin daily.    [provider]  triazolam (HALCION) 0.25 MG tablet Take 0.25 mg by mouth at bedtime.     [provider]      Allergies    Bee pollen, Plaquenil [hydroxychloroquine sulfate], and Lodine [etodolac]    Review of Systems   Review of Systems  Constitutional:  Negative for chills and fever.  HENT:  Negative for ear pain and sore throat.   Eyes:  Negative for pain and visual disturbance.  Respiratory:  Negative for cough and shortness of breath.   Cardiovascular:  Positive for leg swelling. Negative for chest pain and palpitations.  Gastrointestinal:  Negative for abdominal pain and vomiting.  Genitourinary:  Negative for dysuria and hematuria.  Musculoskeletal:  Negative for arthralgias and back pain.  Skin:  Negative for color change and rash.  Neurological:  Negative for seizures and syncope.  All other systems reviewed and are negative.   Physical Exam Updated Vital Signs BP 120/73   Pulse 68   Temp (!) 97.5 F (36.4 C) (Oral)   Resp 16   Ht 1.803 m (5\' 11" )   Wt 79.4 kg   SpO2 98%   BMI 24.41 kg/m  Physical Exam Vitals and nursing note reviewed.  Constitutional:      General: He is not in acute distress.    Appearance: Normal appearance. He is well-developed.  HENT:     Head: Normocephalic and atraumatic.     Mouth/Throat:     Mouth: Mucous membranes are moist.  Eyes:      Conjunctiva/sclera: Conjunctivae normal.  Cardiovascular:     Rate and Rhythm: Normal rate and regular rhythm.     Heart sounds: No murmur heard. Pulmonary:     Effort: Pulmonary effort is normal. No respiratory distress.     Breath sounds: Normal breath sounds. No wheezing, rhonchi or rales.  Abdominal:     Palpations: Abdomen is soft.     Tenderness: There is no abdominal tenderness.  Musculoskeletal:        General: No swelling.     Cervical back: Neck supple.     Right lower leg: Edema present.     Left lower leg: Edema present.     Comments: Patient  with significant bilateral leg swelling up to the knee.  Does have some early erythema chronic stasis skin changes.  Good cap refill to the toes.  Skin:    General: Skin is warm and dry.     Capillary Refill: Capillary refill takes less than 2 seconds.  Neurological:     Mental Status: He is alert.  Psychiatric:        Mood and Affect: Mood normal.     ED Results / Procedures / Treatments   Labs (all labs ordered are listed, but only abnormal results are displayed) Labs Reviewed  COMPREHENSIVE METABOLIC PANEL - Abnormal; Notable for the following components:      Result Value   Sodium 132 (*)    Potassium 3.2 (*)    Calcium 8.1 (*)    Total Protein 6.2 (*)    Albumin 2.4 (*)    All other components within normal limits  CBC WITH DIFFERENTIAL/PLATELET - Abnormal; Notable for the following components:   RBC 2.59 (*)    Hemoglobin 7.2 (*)    HCT 23.2 (*)    RDW 18.6 (*)    All other components within normal limits  PROTIME-INR - Abnormal; Notable for the following components:   Prothrombin Time 15.5 (*)    All other components within normal limits  URINALYSIS, ROUTINE W REFLEX MICROSCOPIC - Abnormal; Notable for the following components:   APPearance CLOUDY (*)    Hgb urine dipstick MODERATE (*)    Protein, ur 30 (*)    Leukocytes,Ua LARGE (*)    Bacteria, UA FEW (*)    All other components within normal limits   URINE CULTURE  BRAIN NATRIURETIC PEPTIDE  TYPE AND SCREEN    EKG None  Radiology No results found.  Procedures Procedures    Medications Ordered in ED Medications - No data to display  ED Course/ Medical Decision Making/ A&P                                 Medical Decision Making Amount and/or Complexity of Data Reviewed Labs: ordered. Radiology: ordered.  Patient is lower extremity edema suggestive of either venous insufficiency or right-sided heart failure.  The skin changes are secondary to chronic derma stasis.  Patient's hemoglobin here today 7.2.  Review of past on September 10 he was 7.8 on September 1 he was 7.6 on August 31 he was 7.3.  Patient was transfused during the last hospitalization with 5 units of blood.  So did have a fairly significant GI bleed.  Patient denies any blood currently.  Vital signs no tachycardia blood pressure is good patient has no lightheadedness.  Workup here today BNP up a little bit at 236.  Urinalysis did have some RBCs and some white blood cells but few bacteria.  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  So probably secondary to that.  Complete metabolic panel potassium down a little bit at 3.2 LFTs normal.  CBC no leukocytosis hemoglobin is mention already 7.2 which is somewhat baseline for him.  And platelets were 270.  Chest x-ray had no acute findings.  And CT renal study done because of the blood in the urine actually showed moderate sigmoid diverticulitis without significant changes no abscess.  This been an ongoing thing.  Patient is already been through 2 courses of antibiotics.  Note also once again this does show suspicion for colovesicular fistula.  Urine sent for  urine culture but will not treat with any antibiotics here again today because they are planning colonoscopy on Tuesday to evaluate this.  And Dr. Lovell Sheehan is also been involved along his gastroenterologist.  Patient nontoxic no acute  distress.   Final Clinical Impression(s) / ED Diagnoses Final diagnoses:  Anemia, unspecified type  Leg swelling    Rx / DC Orders ED Discharge Orders     None         Vanetta Mulders, MD 03/10/23 1314    Vanetta Mulders, MD 03/10/23 [phone]

## 2023-03-10 NOTE — Discharge Instructions (Signed)
Keep your appointment for the repeat colonoscopy.  We can hold off on another course of antibiotics because there is some question of what is going on around the sigmoid colon.  Workup here today suggestive of maybe some right-sided heart failure.  Very important to follow-up with cardiology information provided above call them on Monday and set up an appointment.  Return for fevers persistent vomiting worse abdominal pain.  Or any significant shortness of breath.  As we discussed your hemoglobin here today was 7.2.  Somewhat consistent to where it has been since the end of August.

## 2023-03-10 NOTE — ED Notes (Signed)
Vital signs stable.

## 2023-03-11 NOTE — ED Provider Notes (Signed)
[facility]    CSN: [id number] Arrival date & time: 03/09/23  1532      History   Chief Complaint No chief complaint on file.   HPI Cole Brown is a 77 y.o. male.   Patient presenting today with several weeks of progressively worsening LE edema b/l and now since this morning an erythematous rash to anterior lower legs b/l. States was recently hospitalized (02/12/23) with acute GI bleed that was thought to be secondary to significant ibuprofen use. Had 5 units of blood transfused and per chart review Hgb has been ranging in the 7's. He is currently awaiting colonoscopy with GI in the next week. States edema has been present since these occurrences. Denies new CP, SOB, palpitations, dizziness, abdominal pain, N/V/D, constipation. So far not trying anything for the swelling as compression stockings were not tolerable and seemed to make things worse. Of note, states he has not had repeat labs since discharge from hospital.     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   Gastrointestinal hemorrhage 02/15/2023   Protein-calorie malnutrition, severe 02/14/2023   Acute blood loss anemia 02/12/2023   Perforation of sigmoid colon (HCC) 01/08/2023   Acute post-hemorrhagic anemia 01/08/2023   Diverticulitis 01/03/2023   Hyponatremia 01/03/2023   Hematochezia 12/25/2022   Diverticulosis of colon without hemorrhage 12/25/2022   Diarrhea 03/30/2021   Hemorrhoids 03/30/2021   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   Lower abdominal pain 07/06/2019   Autoimmune disease (HCC) 09/15/2018   [name] disease without gangrene 09/15/2018   Hypercholesteremia 08/21/2018   History of diverticulitis 08/21/2018    History of iron deficiency anemia 08/21/2018   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   Fibromyalgia 08/21/2018   History of multiple allergies 08/21/2018   History of sleep apnea 08/21/2018   Rectal bleed 11/14/2016   Essential hypertension 11/14/2016    Past Surgical History:  Procedure Laterality Date   allergy shots  weekly   BIOPSY  02/15/2023   Procedure: BIOPSY;  Surgeon: Dolores Frame, MD;  Location: [facility];  Service: Gastroenterology;;   CHOLECYSTECTOMY     COLONOSCOPY     Dr.Rehman   COLONOSCOPY N/A 01/27/2015   Rehman: multiple diverticula at sigmoid colon,ext hemorrhoids   COLONOSCOPY WITH PROPOFOL N/A 02/15/2023   Procedure: COLONOSCOPY WITH PROPOFOL;  Surgeon: Dolores Frame, MD;  Location: [facility];  Service: Gastroenterology;  Laterality: N/A;   ESOPHAGOGASTRODUODENOSCOPY (EGD) WITH PROPOFOL N/A 02/15/2023   Procedure: ESOPHAGOGASTRODUODENOSCOPY (EGD) WITH PROPOFOL;  Surgeon: Dolores Frame, MD;  Location: [facility];  Service: Gastroenterology;  Laterality: N/A;   EYE SURGERY Bilateral    partial cornea transplants    GALLBLADDER SURGERY  12/1993   HOT HEMOSTASIS  02/15/2023   Procedure: HOT HEMOSTASIS (ARGON PLASMA COAGULATION/BICAP);  Surgeon: [name], [name], MD;  Location: [facility];  Service: Gastroenterology;;   Susa Day  02/15/2023   Procedure: Susa Day;  Surgeon: [name], [name], MD;  Location: [facility];  Service: Gastroenterology;;   UPPER GASTROINTESTINAL ENDOSCOPY         Home Medications    Prior to Admission medications   Medication Sig Start Date End Date Taking? Authorizing Provider  acetaminophen (TYLENOL 8 HOUR ARTHRITIS PAIN) 650 MG CR tablet Take 650 mg by mouth every 8 (eight) hours as needed for pain.    [provider]  amLODipine (NORVASC) 5 MG tablet Take 1 tablet (5 mg total) by mouth daily. 08/28/22   [name], NP  ampicillin (PRINCIPEN)  500 MG capsule Take 500 mg by mouth 3 (three) times daily.    [provider]  atenolol (TENORMIN) 25 MG tablet TAKE (1/2) TABLET BY MOUTH ONCE DAILY. Patient taking differently: Take 12.5 mg by mouth daily. 12/03/22   [name], MD  azelastine (ASTELIN) 0.1 % nasal spray Place 1 spray into both nostrils 2 (two) times daily. 09/17/19   [provider]  Cholecalciferol (VITAMIN D) 50 MCG (2000 UT) CAPS Take 2,000 Units by mouth daily.    [provider]  diphenhydrAMINE (BENADRYL) 25 MG tablet Take 25 mg by mouth as needed for allergies.     [provider]  EPINEPHrine 0.3 mg/0.3 mL IJ SOAJ injection Inject 0.3 mg into the muscle as needed for anaphylaxis. 10/14/19   [provider]  famotidine (PEPCID) 20 MG tablet Take 1 tablet (20 mg total) by mouth at bedtime. Takes as needed 02/18/23   [name], MD  Devoria Albe (VABYSMO IZ) by Intravitreal route. Monthly    [provider]  FLUoxetine (PROZAC) 10 MG tablet Take 10 mg by mouth daily.    [provider]  fluticasone (FLONASE) 50 MCG/ACT nasal spray Place 1-2 sprays into both nostrils daily. 07/27/20   [provider]  folic acid (FOLVITE) 1 MG tablet TAKE 2 TABLETS BY MOUTH DAILY. Patient taking differently: Take 2 mg by mouth daily. 10/12/22   Gearldine Bienenstock, PA-C  Investigational - Study Medication Astrovasatin study med.    [provider]  levocetirizine (XYZAL) 5 MG tablet Take 5 mg by mouth every evening.     [provider]  methotrexate (RHEUMATREX) 2.5 MG tablet Take 8 tablets (20 mg total) by mouth once a week. 10/25/22   Gearldine Bienenstock, PA-C  montelukast (SINGULAIR) 10 MG tablet Take 10 mg by mouth at bedtime.    [provider]  Multiple Vitamins-Minerals (ICAPS AREDS 2 PO) Take 1 capsule by mouth daily.    [provider]  Omega-3 Fatty Acids (FISH OIL) 1000 MG CAPS Take 1 capsule by mouth daily.    [provider]  OVER THE COUNTER MEDICATION Prep h ointment and cream as needed    [provider]  pantoprazole (PROTONIX) 40 MG tablet Take 1 tablet (40 mg total) by mouth 2 (two) times daily. 02/18/23 02/18/24  [name], MD  Sodium Sulfate-Mag Sulfate-KCl (SUTAB) [phone] MG TABS As directed 02/26/23   Franky Macho, MD  sucralfate (CARAFATE) 1 GM/10ML suspension Take 10 mLs (1 g total) by mouth 4 (four) times daily -  with meals and at bedtime for 21 days. 02/18/23 03/11/23  [name], MD  Testosterone (ANDROGEL TD) Place onto the skin daily.    [provider]  triazolam (HALCION) 0.25 MG tablet Take 0.25 mg by mouth at bedtime.     [provider]    Family History Family History  Problem Relation Age of Onset   Heart disease Mother    Pancreatic cancer Mother    Prostate cancer Father    Healthy Sister    Parkinson's disease Brother    Asthma Sister    Healthy Sister     Social History  Social History   Tobacco Use   Smoking status: Never    Passive exposure: Never   Smokeless tobacco: Never  Vaping Use   Vaping status: Never Used  Substance Use Topics   Alcohol use: Not Currently    Comment: occas   Drug use: No     Allergies   Bee pollen, Plaquenil [hydroxychloroquine sulfate], and Lodine [etodolac]   Review of Systems Review of Systems PER HPI  Physical Exam Triage Vital Signs ED Triage Vitals  Encounter Vitals Group     BP 03/09/23 1542 125/74     Systolic BP Percentile --      Diastolic BP Percentile --      Pulse Rate 03/09/23 1542 67     Resp 03/09/23 1542 20     Temp 03/09/23 1542 98.3 F (36.8 C)     Temp Source 03/09/23 1542 Oral     SpO2 03/09/23 1542 98 %     Weight --      Height --      Head Circumference --      Peak Flow --      Pain Score 03/09/23 1540 4     Pain Loc --      Pain Education --      Exclude from Growth Chart --    No data found.  Updated Vital Signs BP 125/74 (BP Location:  Right Arm)   Pulse 67   Temp 98.3 F (36.8 C) (Oral)   Resp 20   SpO2 98%   Visual Acuity Right Eye Distance:   Left Eye Distance:   Bilateral Distance:    Right Eye Near:   Left Eye Near:    Bilateral Near:     Physical Exam Vitals and nursing note reviewed.  Constitutional:      Appearance: Normal appearance.  HENT:     Head: Atraumatic.  Eyes:     Extraocular Movements: Extraocular movements intact.     Conjunctiva/sclera: Conjunctivae normal.  Cardiovascular:     Rate and Rhythm: Normal rate and regular rhythm.  Pulmonary:     Effort: Pulmonary effort is normal.     Breath sounds: Normal breath sounds.  Musculoskeletal:        General: Swelling and tenderness present. Normal range of motion.     Cervical back: Normal range of motion and neck supple.     Comments: 3+ edema b/l LEs from knees down, ttp diffusely  Skin:    General: Skin is warm and dry.     Comments: Non-blanching vascular type rash to b/l anterior lower legs  Neurological:     General: No focal deficit present.     Mental Status: He is oriented to person, place, and time.     Comments: B/l LEs neurovascularly intact  Psychiatric:        Mood and Affect: Mood normal.        Thought Content: Thought content normal.        Judgment: Judgment normal.      UC Treatments / Results  Labs (all labs ordered are listed, but only abnormal results are displayed) Labs Reviewed  CBC WITH DIFFERENTIAL/PLATELET - Abnormal; Notable for the following components:      Result Value   RBC 2.48 (*)    Hemoglobin 6.7 (*)    Hematocrit 21.8 (*)    MCHC 30.7 (*)    RDW 16.9 (*)    Neutrophils Absolute 7.5 (*)    All other components within  normal limits   Narrative:    Performed at:  [address] [place], [place]  [id number] Lab Director: Jolene Schimke MD, Phone:  [phone]    EKG   Radiology DG Chest 2 View  Result Date: 03/10/2023 CLINICAL DATA:  Lower extremity  swelling.  Low hemoglobin. EXAM: CHEST - 2 VIEW COMPARISON:  01/31/2019 FINDINGS: The heart size and mediastinal contours are within normal limits. Both lungs are clear. Thoracic spine degenerative changes again noted. IMPRESSION: No active cardiopulmonary disease. Electronically Signed   By: Danae Orleans M.D.   On: 03/10/2023 13:14   CT Renal Stone Study  Result Date: 03/10/2023 CLINICAL DATA:  Follow-up diverticulitis. Low hemoglobin. Bladder neck obstruction. EXAM: CT ABDOMEN AND PELVIS WITHOUT CONTRAST TECHNIQUE: Multidetector CT imaging of the abdomen and pelvis was performed following the standard protocol without IV contrast. RADIATION DOSE REDUCTION: This exam was performed according to the departmental dose-optimization program which includes automated exposure control, adjustment of the mA and/or kV according to patient size and/or use of iterative reconstruction technique. COMPARISON:  02/05/2023 FINDINGS: Lower chest: No acute findings. Hepatobiliary:  No mass visualized on this unenhanced exam. Pancreas: No mass or inflammatory process visualized on this unenhanced exam. Spleen:  Within normal limits in size. Adrenals/Urinary tract: No evidence of urolithiasis or hydronephrosis. Wall thickening of the left superior bladder wall is again seen adjacent to the sigmoid diverticulitis. Small amount of gas is again seen within the urinary bladder. These findings remains highly suspicious for colovesical fistula. Stomach/Bowel: Large stool burden is seen throughout the colon. Moderate diverticulitis is again seen involving the sigmoid colon. No evidence of abscess. Vascular/Lymphatic: No pathologically enlarged lymph nodes identified. No evidence of abdominal aortic aneurysm. No evidence of retroperitoneal hemorrhage. Reproductive:  No mass or other significant abnormality. Other:  None. Musculoskeletal:  No suspicious bone lesions identified. IMPRESSION: Moderate sigmoid diverticulitis, without  significant change. No evidence of abscess. Persistent wall thickening of left superior bladder wall, and small amount of gas within the urinary bladder, highly suspicious for colovesical fistula. Large colonic stool burden again noted. Electronically Signed   By: Danae Orleans M.D.   On: 03/10/2023 13:13    Procedures Procedures (including critical care time)  Medications Ordered in UC Medications - No data to display  Initial Impression / Assessment and Plan / UC Course  I have reviewed the triage vital signs and the nursing notes.  Pertinent labs & imaging results that were available during my care of the patient were reviewed by me and considered in my medical decision making (see chart for details).     New rash appears to be related to vascular/stasis etiology. Suspect severe edema secondary to significant blood loss anemia from recent GI bleed. Labs have not been repeated since d/c from hospital - will repeat for safety check given unresolving sxs. Legs wrapped to tolerance in ace wrap today as he does not tolerate compression stockings, discussed elevation, massage and close PCP and specialist f/u. VSs stable and WNL today and he appears in no acute distress.  Final Clinical Impressions(s) / UC Diagnoses   Final diagnoses:  Acute post-hemorrhagic anemia  Bilateral leg edema     Discharge Instructions      Try wrapping the legs in Ace wrap for compression daily to see if this helps with the edema since you are intolerant to the pressure of compression stockings at this time.  Elevate your legs at rest to help with the swelling further.  We  have obtained a CBC today to check your blood counts.  If your symptoms significantly worsen, go to the emergency department for immediate further evaluation.  Otherwise follow-up with your primary care provider or specialist managing this condition as soon as possible.    ED Prescriptions   None    PDMP not reviewed this encounter.    Particia Nearing, [place] 03/11/23 2146

## 2023-03-12 ENCOUNTER — Encounter (HOSPITAL_COMMUNITY): Payer: Self-pay

## 2023-03-12 ENCOUNTER — Encounter (HOSPITAL_COMMUNITY)
Admission: RE | Admit: 2023-03-12 | Discharge: 2023-03-12 | Disposition: A | Discharge status: Home / Self Care | Payer: Medicare HMO | Source: Ambulatory Visit | Attending: Gastroenterology | Admitting: Gastroenterology

## 2023-03-12 VITALS — BP 136/80 | HR 70 | Temp 98.1°F | Resp 18 | Ht 71.0 in | Wt 175.0 lb

## 2023-03-12 DIAGNOSIS — E871 Hypo-osmolality and hyponatremia: Secondary | ICD-10-CM | POA: Insufficient documentation

## 2023-03-12 DIAGNOSIS — K921 Melena: Secondary | ICD-10-CM | POA: Insufficient documentation

## 2023-03-12 DIAGNOSIS — Z01818 Encounter for other preprocedural examination: Secondary | ICD-10-CM | POA: Insufficient documentation

## 2023-03-12 DIAGNOSIS — E43 Unspecified severe protein-calorie malnutrition: Secondary | ICD-10-CM | POA: Diagnosis not present

## 2023-03-12 DIAGNOSIS — K5792 Diverticulitis of intestine, part unspecified, without perforation or abscess without bleeding: Secondary | ICD-10-CM | POA: Insufficient documentation

## 2023-03-12 HISTORY — DX: Anxiety disorder, unspecified: F41.9

## 2023-03-12 HISTORY — DX: Depression, unspecified: F32.A

## 2023-03-12 HISTORY — DX: Gastric ulcer, unspecified as acute or chronic, without hemorrhage or perforation: K25.9

## 2023-03-12 LAB — URINE CULTURE: Culture: >=100000 — AB

## 2023-03-12 LAB — CBC WITH DIFFERENTIAL/PLATELET
Abs Immature Granulocytes: 0.02 10*3/uL (ref 0.00–0.07)
Basophils Absolute: 0 10*3/uL (ref 0.0–0.1)
Basophils Relative: 0 %
Eosinophils Absolute: 0.4 10*3/uL (ref 0.0–0.5)
Eosinophils Relative: 5 %
HCT: 22.9 % — ABNORMAL LOW (ref 39.0–52.0)
Hemoglobin: 7 g/dL — ABNORMAL LOW (ref 13.0–17.0)
Immature Granulocytes: 0 %
Lymphocytes Relative: 10 %
Lymphs Abs: 0.8 10*3/uL (ref 0.7–4.0)
MCH: 27.6 pg (ref 26.0–34.0)
MCHC: 30.6 g/dL (ref 30.0–36.0)
MCV: 90.2 fL (ref 80.0–100.0)
Monocytes Absolute: 0.4 10*3/uL (ref 0.1–1.0)
Monocytes Relative: 5 %
Neutro Abs: 6.2 10*3/uL (ref 1.7–7.7)
Neutrophils Relative %: 80 %
Platelets: 290 10*3/uL (ref 150–400)
RBC: 2.54 MIL/uL — ABNORMAL LOW (ref 4.22–5.81)
RDW: 18.6 % — ABNORMAL HIGH (ref 11.5–15.5)
WBC: 7.8 10*3/uL (ref 4.0–10.5)
nRBC: 0 % (ref 0.0–0.2)

## 2023-03-12 LAB — BASIC METABOLIC PANEL
Anion gap: 9 (ref 5–15)
BUN: 23 mg/dL (ref 8–23)
CO2: 21 mmol/L — ABNORMAL LOW (ref 22–32)
Calcium: 7.8 mg/dL — ABNORMAL LOW (ref 8.9–10.3)
Chloride: 101 mmol/L (ref 98–111)
Creatinine, Ser: 0.74 mg/dL (ref 0.61–1.24)
GFR, Estimated: >60 mL/min (ref >60)
Glucose, Bld: 126 mg/dL — ABNORMAL HIGH (ref 70–99)
Potassium: 3.4 mmol/L — ABNORMAL LOW (ref 3.5–5.1)
Sodium: 131 mmol/L — ABNORMAL LOW (ref 135–145)

## 2023-03-12 NOTE — Progress Notes (Signed)
Patient was seen in the ED over the weekend and was referred to cardiology for evaluation of venous insuffiencey and right sided heart failure. The appointment is later in October.  The EGD is urgent so we will need to proceed this week instead of waiting to see cardiology.

## 2023-03-13 ENCOUNTER — Telehealth (HOSPITAL_BASED_OUTPATIENT_CLINIC_OR_DEPARTMENT_OTHER): Payer: Self-pay

## 2023-03-13 NOTE — Progress Notes (Signed)
ED Antimicrobial Stewardship Positive Culture Follow Up   Cole Brown is an 77 y.o. male who presented to [facility] on 03/10/2023 with a chief complaint of No chief complaint on file.   Recent Results (from the past 720 hour(s))  Urine Culture     Status: Abnormal   Collection Time: 02/12/23  4:23 PM   Specimen: Urine, Random  Result Value Ref Range Status   Specimen Description   Final    URINE, RANDOM Performed at [facility], [address] [place], [place] [address]    Special Requests   Final    NONE Performed at [facility], [address] [place], [place] [address]    Culture >=100,000 COLONIES/mL ENTEROCOCCUS FAECALIS (A)  Final   Report Status 02/14/2023 FINAL  Final   Organism ID, Bacteria ENTEROCOCCUS FAECALIS (A)  Final      Susceptibility   Enterococcus faecalis - MIC*    AMPICILLIN <=2 SENSITIVE Sensitive     NITROFURANTOIN <=16 SENSITIVE Sensitive     VANCOMYCIN 1 SENSITIVE Sensitive     * >=100,000 COLONIES/mL ENTEROCOCCUS FAECALIS  MRSA Next Gen by PCR, Nasal     Status: None   Collection Time: 02/14/23  9:21 PM   Specimen: Nasal Mucosa; Nasal Swab  Result Value Ref Range Status   MRSA by PCR Next Gen NOT DETECTED NOT DETECTED Final    Comment: (NOTE) The GeneXpert MRSA Assay (FDA approved for NASAL specimens only), is one component of a comprehensive MRSA colonization surveillance program. It is not intended to diagnose MRSA infection nor to guide or monitor treatment for MRSA infections. Test performance is not FDA approved in patients less than 62 years old. Performed at [facility], [address] [place], [place] [address]   Urine Culture     Status: Abnormal   Collection Time: 03/10/23 11:41 AM   Specimen: Urine, Clean Catch  Result Value Ref Range Status   Specimen Description   Final    URINE, CLEAN CATCH Performed at [facility], [address] [place], [place] [address]    Special Requests   Final    NONE Performed at [facility], [address], [place], [place] [address]    Culture (A)  Final    >=100,000 COLONIES/mL KLEBSIELLA OXYTOCA 90,000 COLONIES/mL ENTEROCOCCUS FAECALIS    Report Status 03/12/2023 FINAL  Final   Organism ID, Bacteria KLEBSIELLA OXYTOCA (A)  Final   Organism ID, Bacteria ENTEROCOCCUS FAECALIS (A)  Final      Susceptibility   Enterococcus faecalis - MIC*    AMPICILLIN <=2 SENSITIVE Sensitive     NITROFURANTOIN <=16 SENSITIVE Sensitive     VANCOMYCIN 1 SENSITIVE Sensitive     * 90,000 COLONIES/mL ENTEROCOCCUS FAECALIS   Klebsiella oxytoca - MIC*    AMPICILLIN >=32 RESISTANT Resistant     CEFEPIME <=0.12 SENSITIVE Sensitive     CEFTRIAXONE <=0.25 SENSITIVE Sensitive     CIPROFLOXACIN <=0.25 SENSITIVE Sensitive     GENTAMICIN <=1 SENSITIVE Sensitive     IMIPENEM <=0.25 SENSITIVE Sensitive     NITROFURANTOIN <=16 SENSITIVE Sensitive     TRIMETH/SULFA <=20 SENSITIVE Sensitive     AMPICILLIN/SULBACTAM 8 SENSITIVE Sensitive     PIP/TAZO <=4 SENSITIVE Sensitive     * >=100,000 COLONIES/mL KLEBSIELLA OXYTOCA    [x]  Patient discharged originally without antimicrobial agent and treatment is now indicated  New antibiotic prescription: Augmentin 875mg  take 1 tablet po BID x 7 days  ED Provider: Valrie Hart, PA-C  Doristine Counter, PharmD, BCPS 03/13/2023, 9:34 AM Clinical Pharmacist Monday - Friday phone -  [phone] Saturday - Sunday phone - [phone]

## 2023-03-13 NOTE — Telephone Encounter (Signed)
Post ED Visit - Positive Culture Follow-up: Successful Patient Follow-Up  Culture assessed and recommendations reviewed by:  []  Enzo Bi, Pharm.D. []  Celedonio Miyamoto, Pharm.D., BCPS AQ-ID [x]  Wilburn Cornelia, Pharm.D., BCPS []  Georgina Pillion, Pharm.D., BCPS []  [place], [address].D., BCPS, AAHIVP []  Estella Husk, Pharm.D., BCPS, AAHIVP []  Lysle Pearl, PharmD, BCPS []  Phillips Climes, PharmD, BCPS []  Agapito Games, PharmD, BCPS []  Verlan Friends, PharmD  Positive urine culture  [x]  Patient discharged without antimicrobial prescription and treatment is now indicated []  Organism is resistant to prescribed ED discharge antimicrobial []  Patient with positive blood cultures  Changes discussed with ED provider: Valrie Hart, PA-C New antibiotic prescription Augmentin 875 mg po BID x 7 days Called to [facility] in [place]  Contacted patient wife, date 9/25//2025, time 1>45 pm   Cole Brown 03/13/2023, 1:51 PM

## 2023-03-14 ENCOUNTER — Encounter (HOSPITAL_COMMUNITY): Payer: Self-pay

## 2023-03-14 ENCOUNTER — Other Ambulatory Visit: Payer: Self-pay

## 2023-03-14 ENCOUNTER — Encounter (HOSPITAL_COMMUNITY)
Admission: RE | Disposition: A | Discharge status: Home / Self Care | Payer: Self-pay | Source: Home / Self Care | Attending: Gastroenterology

## 2023-03-14 ENCOUNTER — Ambulatory Visit (HOSPITAL_COMMUNITY): Payer: Medicare HMO | Admitting: Anesthesiology

## 2023-03-14 ENCOUNTER — Ambulatory Visit (HOSPITAL_BASED_OUTPATIENT_CLINIC_OR_DEPARTMENT_OTHER): Payer: Medicare HMO | Admitting: Anesthesiology

## 2023-03-14 ENCOUNTER — Ambulatory Visit (HOSPITAL_COMMUNITY)
Admission: RE | Admit: 2023-03-14 | Discharge: 2023-03-14 | Disposition: A | Discharge status: Home / Self Care | Payer: Medicare HMO | Attending: Gastroenterology | Admitting: Gastroenterology

## 2023-03-14 DIAGNOSIS — K648 Other hemorrhoids: Secondary | ICD-10-CM | POA: Diagnosis not present

## 2023-03-14 DIAGNOSIS — D509 Iron deficiency anemia, unspecified: Secondary | ICD-10-CM | POA: Insufficient documentation

## 2023-03-14 DIAGNOSIS — N321 Vesicointestinal fistula: Secondary | ICD-10-CM | POA: Diagnosis not present

## 2023-03-14 DIAGNOSIS — K644 Residual hemorrhoidal skin tags: Secondary | ICD-10-CM | POA: Insufficient documentation

## 2023-03-14 DIAGNOSIS — I7300 Raynaud's syndrome without gangrene: Secondary | ICD-10-CM | POA: Insufficient documentation

## 2023-03-14 DIAGNOSIS — K921 Melena: Secondary | ICD-10-CM | POA: Insufficient documentation

## 2023-03-14 DIAGNOSIS — K279 Peptic ulcer, site unspecified, unspecified as acute or chronic, without hemorrhage or perforation: Secondary | ICD-10-CM | POA: Diagnosis not present

## 2023-03-14 DIAGNOSIS — K56699 Other intestinal obstruction unspecified as to partial versus complete obstruction: Secondary | ICD-10-CM

## 2023-03-14 DIAGNOSIS — M797 Fibromyalgia: Secondary | ICD-10-CM | POA: Diagnosis not present

## 2023-03-14 DIAGNOSIS — I10 Essential (primary) hypertension: Secondary | ICD-10-CM | POA: Insufficient documentation

## 2023-03-14 DIAGNOSIS — K449 Diaphragmatic hernia without obstruction or gangrene: Secondary | ICD-10-CM | POA: Insufficient documentation

## 2023-03-14 DIAGNOSIS — M1990 Unspecified osteoarthritis, unspecified site: Secondary | ICD-10-CM | POA: Diagnosis not present

## 2023-03-14 DIAGNOSIS — F32A Depression, unspecified: Secondary | ICD-10-CM | POA: Insufficient documentation

## 2023-03-14 DIAGNOSIS — F419 Anxiety disorder, unspecified: Secondary | ICD-10-CM | POA: Insufficient documentation

## 2023-03-14 DIAGNOSIS — K56609 Unspecified intestinal obstruction, unspecified as to partial versus complete obstruction: Secondary | ICD-10-CM | POA: Diagnosis not present

## 2023-03-14 DIAGNOSIS — R933 Abnormal findings on diagnostic imaging of other parts of digestive tract: Secondary | ICD-10-CM | POA: Insufficient documentation

## 2023-03-14 HISTORY — PX: FLEXIBLE SIGMOIDOSCOPY: SHX5431

## 2023-03-14 LAB — HM COLONOSCOPY

## 2023-03-14 SURGERY — SIGMOIDOSCOPY, FLEXIBLE
Anesthesia: General

## 2023-03-14 MED ORDER — PROPOFOL 500 MG/50ML IV EMUL
INTRAVENOUS | Status: DC | PRN
  Administered 2023-03-14: 150 ug/kg/min via INTRAVENOUS

## 2023-03-14 MED ORDER — PHENYLEPHRINE 80 MCG/ML (10ML) SYRINGE FOR IV PUSH (FOR BLOOD PRESSURE SUPPORT)
PREFILLED_SYRINGE | INTRAVENOUS | Status: DC | PRN
Start: 2023-03-14 — End: 2023-03-14
  Administered 2023-03-14 (×4): 160 ug via INTRAVENOUS

## 2023-03-14 MED ORDER — PROPOFOL 10 MG/ML IV BOLUS
INTRAVENOUS | Status: DC | PRN
  Administered 2023-03-14 (×2): 40 mg via INTRAVENOUS
  Administered 2023-03-14: 100 mg via INTRAVENOUS
  Administered 2023-03-14: 40 mg via INTRAVENOUS

## 2023-03-14 MED ORDER — LIDOCAINE HCL (CARDIAC) PF 100 MG/5ML IV SOSY
PREFILLED_SYRINGE | INTRAVENOUS | Status: DC | PRN
  Administered 2023-03-14: 50 mg via INTRAVENOUS

## 2023-03-14 MED ORDER — LACTATED RINGERS IV SOLN: INTRAVENOUS | Status: DC

## 2023-03-14 NOTE — Anesthesia Preprocedure Evaluation (Signed)
Anesthesia Evaluation  Patient identified by MRN, date of birth, ID band Patient awake    Reviewed: Allergy & Precautions, H&P , NPO status , Patient's Chart, lab work & pertinent test results, reviewed documented beta blocker date and time   Airway Mallampati: II  TM Distance: >3 FB Neck ROM: full    Dental no notable dental hx.    Pulmonary neg pulmonary ROS   Pulmonary exam normal breath sounds clear to auscultation       Cardiovascular Exercise Tolerance: Good hypertension, negative cardio ROS  Rhythm:regular Rate:Normal     Neuro/Psych  PSYCHIATRIC DISORDERS Anxiety Depression     Neuromuscular disease negative neurological ROS  negative psych ROS   GI/Hepatic negative GI ROS, Neg liver ROS, PUD,,,  Endo/Other  negative endocrine ROS    Renal/GU negative Renal ROS  negative genitourinary   Musculoskeletal   Abdominal   Peds  Hematology negative hematology ROS (+) Blood dyscrasia, anemia   Anesthesia Other Findings   Reproductive/Obstetrics negative OB ROS                             Anesthesia Physical Anesthesia Plan  ASA: 3  Anesthesia Plan: General   Post-op Pain Management:    Induction:   PONV Risk Score and Plan: Propofol infusion  Airway Management Planned:   Additional Equipment:   Intra-op Plan:   Post-operative Plan:   Informed Consent: I have reviewed the patients History and Physical, chart, labs and discussed the procedure including the risks, benefits and alternatives for the proposed anesthesia with the patient or authorized representative who has indicated his/her understanding and acceptance.     Dental Advisory Given  Plan Discussed with: CRNA  Anesthesia Plan Comments: (Cardiology workup pending for LE edema, however Hb very low and this procedure deemed to be urgent.  Will proceed assuming some sort of diastolic dysfunction.)        Anesthesia Quick Evaluation

## 2023-03-14 NOTE — Transfer of Care (Signed)
Immediate Anesthesia Transfer of Care Note  Patient: Cole Brown  Procedure(s) Performed: FLEXIBLE SIGMOIDOSCOPY  Patient Location: Short Stay  Anesthesia Type:General  Level of Consciousness: awake  Airway & Oxygen Therapy: Patient Spontanous Breathing  Post-op Assessment: Report given to RN and Post -op Vital signs reviewed and stable  Post vital signs: Reviewed and stable  Last Vitals:  Vitals Value Taken Time  BP    Temp    Pulse    Resp    SpO2      Last Pain:  Vitals:   03/14/23 1322  TempSrc:   PainSc: 0-No pain      Patients Stated Pain Goal: 5 (03/14/23 1211)  Complications: No notable events documented.

## 2023-03-14 NOTE — Interval H&P Note (Signed)
History and Physical Interval Note:  03/14/2023 1:08 PM  Cole Brown  has presented today for surgery, with the diagnosis of COLOVESICULAR FISTULA.  The various methods of treatment have been discussed with the patient and family. After consideration of risks, benefits and other options for treatment, the patient has consented to  Procedure(s) with comments: COLONOSCOPY WITH PROPOFOL (N/A) - 2:00PM;ASA 3 as a surgical intervention.  The patient's history has been reviewed, patient examined, no change in status, stable for surgery.  I have reviewed the patient's chart and labs.  Questions were answered to the patient's satisfaction.     Juanetta Beets Jerret Mcbane

## 2023-03-14 NOTE — Anesthesia Procedure Notes (Signed)
Date/Time: 03/14/2023 1:22 PM  Performed by: Julian Reil, CRNAPre-anesthesia Checklist: Patient identified, Emergency Drugs available, Suction available and Patient being monitored Patient Re-evaluated:Patient Re-evaluated prior to induction Oxygen Delivery Method: Nasal cannula Induction Type: IV induction Placement Confirmation: positive ETCO2

## 2023-03-14 NOTE — Op Note (Signed)
[facility] Patient Name: Cole Brown Procedure Date: 03/14/2023 1:01 PM MRN: [id number] Date of Birth: 1945/09/30 Attending MD: Sanjuan Dame , MD, [id number] CSN: [id number] Age: 77 Admit Type: Outpatient Procedure:                Colonoscopy Indications:              Hematochezia, Iron deficiency anemia, Abnormal CT                            of the GI tract Providers:                Sanjuan Dame, MD, Angelica Ran, Dyann Ruddle Referring MD:              Medicines:                Monitored Anesthesia Care Complications:            No immediate complications. Estimated Blood Loss:     Estimated blood loss: none. Procedure:                Pre-Anesthesia Assessment:                           - Prior to the procedure, a History and Physical                            was performed, and patient medications and                            allergies were reviewed. The patient's tolerance of                            previous anesthesia was also reviewed. The risks                            and benefits of the procedure and the sedation                            options and risks were discussed with the patient.                            All questions were answered, and informed consent                            was obtained. Prior Anticoagulants: The patient has                            taken no anticoagulant or antiplatelet agents. ASA                            Grade Assessment: III - A patient with severe                            systemic disease. After reviewing the risks and  benefits, the patient was deemed in satisfactory                            condition to undergo the procedure.                           After obtaining informed consent, the colonoscope                            was passed under direct vision. Throughout the                            procedure, the patient's blood pressure, pulse, and                             oxygen saturations were monitored continuously. The                            [id number]) scope was introduced through the                            anus and advanced to the the sigmoid colon. The                            colonoscopy was technically difficult and complex                            due to bowel stenosis. Successful completion of the                            procedure was aided by withdrawing the scope and                            replacing with the pediatric endoscope and                            withdrawing the scope and replacing with the                            'babyscope'. Scope In: 1:27:12 PM Scope Out: 2:13:25 PM Total Procedure Duration: 0 hours 46 minutes 13 seconds  Findings:      The perianal and digital rectal examinations were normal.      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      Non-bleeding external and internal hemorrhoids were found. The       hemorrhoids were small. 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 This stricture was not able to be  traversed with changing 3 scopes; peadiatric ,                            Ultraslim colonoscope and spaghetti upper scope.                            Malignancy is not excluded                           - Non-bleeding external and internal hemorrhoids.                           - No specimens collected. Moderate Sedation:      Per Anesthesia Care Recommendation:           - Patient has a contact number available for                            emergencies. The signs and symptoms of potential                            delayed complications were discussed with the                            patient. Return to normal activities tomorrow.                            Written discharge instructions were provided to the                            patient.                            - Resume previous diet.                           - Continue present medications.                           -Patient to see Dr Lovell Sheehan on October 1st at                            1:45pm. Spoke with Dr. Lovell Sheehan Procedure Code(s):        --- Professional ---                           [phone], 53, Colonoscopy, flexible; diagnostic,                            including collection of specimen(s) by brushing or                            washing, when performed (separate procedure) Diagnosis Code(s):        --- Professional ---                           U04.540,  Other intestinal obstruction unspecified                            as to partial versus complete obstruction                           K64.8, Other hemorrhoids                           K92.1, Melena (includes Hematochezia)                           D50.9, Iron deficiency anemia, unspecified                           R93.3, Abnormal findings on diagnostic imaging of                            other parts of digestive tract CPT copyright 2022 American Medical Association. All rights reserved. The codes documented in this report are preliminary and upon coder review may  be revised to meet current compliance requirements. Sanjuan Dame, MD Sanjuan Dame, MD 03/14/2023 2:31:02 PM This report has been signed electronically. Number of Addenda: 0

## 2023-03-15 ENCOUNTER — Encounter (INDEPENDENT_AMBULATORY_CARE_PROVIDER_SITE_OTHER): Payer: Self-pay | Admitting: *Deleted

## 2023-03-15 ENCOUNTER — Telehealth (INDEPENDENT_AMBULATORY_CARE_PROVIDER_SITE_OTHER): Payer: Self-pay

## 2023-03-15 NOTE — Telephone Encounter (Signed)
Also, does the patient need to resume the oral Iron?   I made the patient wife aware   Patient has residual stool, I would advise not to take imodium for now as he took prep yesterday and was constipated which is still emptying . If he started to have a lot of diarrhea than may need to be checked for C.Diff as he is on antibiotics  She will let us know if he has continuing issues with diarrhea and we will order cdiff, as she says she agrees with this being residual emptying from the prep.

## 2023-03-15 NOTE — Telephone Encounter (Signed)
Patient wife called today states the patient has had some issues with diarrhea and fecal leakage since yesterdays Tcs. Patient denies any abdominal pains or sight of blood. Patient wanted to know if ok to take imodium prn loose stools. I advised, that that was reasonable,but she wanted me to also ask about whether or not the patient needed to resume his Po Fe. Please advise.

## 2023-03-15 NOTE — Telephone Encounter (Signed)
Yes he is Iron deficient and continue taking it every other day

## 2023-03-15 NOTE — Telephone Encounter (Signed)
Patient has residual stool, I would advise not to take imodium for now as he took prep yesterday and was constipated which is still emptying . If he started to have a lot of diarrhea than may need to be checked for C.Diff as he is on antibiotics

## 2023-03-18 ENCOUNTER — Encounter (INDEPENDENT_AMBULATORY_CARE_PROVIDER_SITE_OTHER): Payer: Self-pay

## 2023-03-18 NOTE — Anesthesia Postprocedure Evaluation (Signed)
Anesthesia Post Note  Patient: Cole Brown  Procedure(s) Performed: FLEXIBLE SIGMOIDOSCOPY  Patient location during evaluation: Phase II Anesthesia Type: General Level of consciousness: awake Pain management: pain level controlled Vital Signs Assessment: post-procedure vital signs reviewed and stable Respiratory status: spontaneous breathing and respiratory function stable Cardiovascular status: blood pressure returned to baseline and stable Postop Assessment: no headache and no apparent nausea or vomiting Anesthetic complications: no Comments: Late entry   No notable events documented.   Last Vitals:  Vitals:   03/14/23 1211 03/14/23 1419  BP: 124/70 (!) 106/51  Pulse: (!) 58 61  Resp: 19 13  Temp: 36.7 C 36.7 C  SpO2: 100% 100%    Last Pain:  Vitals:   03/14/23 1419  TempSrc: Oral  PainSc: 0-No pain                 Windell Norfolk

## 2023-03-18 NOTE — Telephone Encounter (Signed)
Sent My Chart Message stating per Dr. Tasia Catchings,   Yes he is Iron deficient and continue taking it every other day

## 2023-03-19 ENCOUNTER — Encounter: Payer: Self-pay | Admitting: General Surgery

## 2023-03-19 ENCOUNTER — Ambulatory Visit: Payer: Medicare HMO | Admitting: General Surgery

## 2023-03-19 ENCOUNTER — Other Ambulatory Visit: Payer: Self-pay

## 2023-03-19 VITALS — BP 128/74 | HR 70 | Temp 98.3°F | Resp 16 | Ht 71.0 in | Wt 187.0 lb

## 2023-03-19 DIAGNOSIS — N321 Vesicointestinal fistula: Secondary | ICD-10-CM | POA: Diagnosis not present

## 2023-03-20 NOTE — Progress Notes (Signed)
Cole Brown; [id number]; [birth year]   HPI Patient is a 77 year old white male who was referred to my care by Dr. Phillips Odor and Dr. 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  This was found on workup for anemia of unknown etiology.  Patient states he occasionally passes air when urinating.  Patient is somewhat forgetful and is a poor historian.  A recent CT scan of the abdomen pelvis revealed inflammation with diverticulosis around the sigmoid colon with a colovesical fistula present on the superior dome of the bladder.  No abscess cavity is present.  A flexible sigmoidoscopy was attempted but could not be passed through this area.  His bowel movements are fairly regular.  He denies any blood per rectum.  He does have a history of significant ulceration of his esophagus and stomach. Past Medical History:  Diagnosis Date   Anxiety    Depression    Diverticula of colon    2016 colonoscopy   Fibromyalgia    Fibromyalgia    History of GI diverticular bleed    Hypercholesteremia    Hypertension    Multiple gastric ulcers     Past Surgical History:  Procedure Laterality Date   allergy shots  weekly   BIOPSY  02/15/2023   Procedure: BIOPSY;  Surgeon: Dolores Frame, MD;  Location: [facility];  Service: Gastroenterology;;   CHOLECYSTECTOMY     COLONOSCOPY     Dr.Rehman   COLONOSCOPY N/A 01/27/2015   Rehman: multiple diverticula at sigmoid colon,ext hemorrhoids   COLONOSCOPY WITH PROPOFOL N/A 02/15/2023   Procedure: COLONOSCOPY WITH PROPOFOL;  Surgeon: Dolores Frame, MD;  Location: [facility];  Service: Gastroenterology;  Laterality: N/A;   ESOPHAGOGASTRODUODENOSCOPY (EGD) WITH PROPOFOL N/A 02/15/2023   Procedure: ESOPHAGOGASTRODUODENOSCOPY (EGD) WITH PROPOFOL;  Surgeon: Dolores Frame, MD;  Location: [facility];  Service: Gastroenterology;  Laterality: N/A;   EYE SURGERY Bilateral    partial cornea transplants    GALLBLADDER SURGERY   12/1993   HOT HEMOSTASIS  02/15/2023   Procedure: HOT HEMOSTASIS (ARGON PLASMA COAGULATION/BICAP);  Surgeon: [name], [name], MD;  Location: [facility];  Service: Gastroenterology;;   Susa Day  02/15/2023   Procedure: Susa Day;  Surgeon: [name], [name], MD;  Location: [facility];  Service: Gastroenterology;;   UPPER GASTROINTESTINAL ENDOSCOPY      Family History  Problem Relation Age of Onset   Heart disease Mother    Pancreatic cancer Mother    Prostate cancer Father    Healthy Sister    Parkinson's disease Brother    Asthma Sister    Healthy Sister     Current Outpatient Medications on File Prior to Visit  Medication Sig Dispense Refill   acetaminophen (TYLENOL 8 HOUR ARTHRITIS PAIN) 650 MG CR tablet Take 2,000 mg by mouth every 12 (twelve) hours.     amLODipine (NORVASC) 5 MG tablet Take 1 tablet (5 mg total) by mouth daily. 90 tablet 3   amoxicillin-clavulanate (AUGMENTIN) 875-125 MG tablet Take 1 tablet by mouth 2 (two) times daily.     atenolol (TENORMIN) 25 MG tablet TAKE (1/2) TABLET BY MOUTH ONCE DAILY. (Patient taking differently: Take 12.5 mg by mouth daily.) 45 tablet 2   azelastine (ASTELIN) 0.1 % nasal spray Place 1 spray into both nostrils 2 (two) times daily.     Cholecalciferol (VITAMIN D) 50 MCG (2000 UT) CAPS Take 2,000 Units by mouth daily.     diphenhydrAMINE (BENADRYL) 25 MG tablet Take 25 mg  by mouth as needed for allergies.      EPINEPHrine 0.3 mg/0.3 mL IJ SOAJ injection Inject 0.3 mg into the muscle as needed for anaphylaxis.     famotidine (PEPCID) 20 MG tablet Take 1 tablet (20 mg total) by mouth at bedtime. Takes as needed 30 tablet 1   FLUoxetine (PROZAC) 10 MG tablet Take 10 mg by mouth daily.     fluticasone (FLONASE) 50 MCG/ACT nasal spray Place 1-2 sprays into both nostrils daily.     folic acid (FOLVITE) 1 MG tablet TAKE 2 TABLETS BY MOUTH DAILY. (Patient taking differently: Take 2 mg by mouth daily.) 180 tablet 3    Investigational - Study Medication Atorvastatin study med. PREVENTABLE trial     levocetirizine (XYZAL) 5 MG tablet Take 5 mg by mouth every evening.      methotrexate (RHEUMATREX) 2.5 MG tablet Take 8 tablets (20 mg total) by mouth once a week. 96 tablet 0   montelukast (SINGULAIR) 10 MG tablet Take 10 mg by mouth at bedtime.     Multiple Vitamins-Minerals (ICAPS AREDS 2 PO) Take 1 capsule by mouth daily.     Omega-3 Fatty Acids (FISH OIL) 1000 MG CAPS Take 1 capsule by mouth daily.     OVER THE COUNTER MEDICATION Prep h ointment and cream as needed     pantoprazole (PROTONIX) 40 MG tablet Take 1 tablet (40 mg total) by mouth 2 (two) times daily. 60 tablet 2   Testosterone (ANDROGEL TD) Place onto the skin daily.     triazolam (HALCION) 0.25 MG tablet Take 0.25 mg by mouth at bedtime.      No current facility-administered medications on file prior to visit.    Allergies  Allergen Reactions   Bee Pollen     Seasonal allergies   Plaquenil [Hydroxychloroquine Sulfate]     rash   Lodine [Etodolac] Swelling and Rash    Social History   Substance and Sexual Activity  Alcohol Use Not Currently   Comment: occas    Social History   Tobacco Use  Smoking Status Never   Passive exposure: Never  Smokeless Tobacco Never    Review of Systems  Constitutional: Negative.   HENT:  Positive for sinus pain.   Eyes: Negative.   Respiratory: Negative.    Cardiovascular: Negative.   Gastrointestinal: Negative.   Genitourinary: Negative.   Musculoskeletal:  Positive for joint pain.  Skin: Negative.   Neurological: Negative.   Endo/Heme/Allergies: Negative.   Psychiatric/Behavioral: Negative.      Objective   Vitals:   03/19/23 1337  BP: 128/74  Pulse: 70  Resp: 16  Temp: 98.3 F (36.8 C)  SpO2: 96%    Physical Exam Vitals reviewed.  Constitutional:      Appearance: Normal appearance. He is normal weight. He is not ill-appearing.  HENT:     Head: Normocephalic and  atraumatic.  Cardiovascular:     Rate and Rhythm: Normal rate and regular rhythm.     Heart sounds: Normal heart sounds. No murmur heard.    No friction rub. No gallop.  Pulmonary:     Effort: Pulmonary effort is normal. No respiratory distress.     Breath sounds: Normal breath sounds. No stridor. No wheezing, rhonchi or rales.  Abdominal:     General: Bowel sounds are normal. There is no distension.     Palpations: Abdomen is soft. There is no mass.     Tenderness: There is no abdominal tenderness. There is no guarding or  rebound.     Hernia: No hernia is present.  Skin:    General: Skin is warm and dry.  Neurological:     Mental Status: He is alert and oriented to person, place, and time.   Dr. Marcelino Freestone notes reviewed.  CT scan images personally reviewed.  Assessment  Colovesical fistula with sigmoid colon stricture. Plan  I told the patient and wife that this would need to be resected with urology on backup to possibly repair the bladder.  He does have a history of cardiac disease and is seeing Dr. Wyline Mood of cardiology on 03/29/2023.  He will see me back in the office on 04/02/2023.  He will need a partial colectomy with repair of the bladder.  I did tell them there is a risk of needing a colostomy.  I also told them that this could be a result of malignancy and that would be evaluated at the time of surgery.  Risks and benefits of the procedure including bleeding, infection, cardiopulmonary difficulties, the possibility of a blood transfusion, and the possibility of a colostomy were fully explained to the patient, who gave informed consent.  He may need a blood transfusion prior to surgical intervention.  Awaiting formal cardiology clearance.

## 2023-03-21 DIAGNOSIS — E559 Vitamin D deficiency, unspecified: Secondary | ICD-10-CM | POA: Diagnosis not present

## 2023-03-21 DIAGNOSIS — M797 Fibromyalgia: Secondary | ICD-10-CM | POA: Diagnosis not present

## 2023-03-21 DIAGNOSIS — M129 Arthropathy, unspecified: Secondary | ICD-10-CM | POA: Diagnosis not present

## 2023-03-21 DIAGNOSIS — M545 Low back pain, unspecified: Secondary | ICD-10-CM | POA: Diagnosis not present

## 2023-03-21 DIAGNOSIS — M549 Dorsalgia, unspecified: Secondary | ICD-10-CM | POA: Diagnosis not present

## 2023-03-21 DIAGNOSIS — M25551 Pain in right hip: Secondary | ICD-10-CM | POA: Diagnosis not present

## 2023-03-21 DIAGNOSIS — M2550 Pain in unspecified joint: Secondary | ICD-10-CM | POA: Diagnosis not present

## 2023-03-21 DIAGNOSIS — R030 Elevated blood-pressure reading, without diagnosis of hypertension: Secondary | ICD-10-CM | POA: Diagnosis not present

## 2023-03-21 DIAGNOSIS — Z79899 Other long term (current) drug therapy: Secondary | ICD-10-CM | POA: Diagnosis not present

## 2023-03-21 DIAGNOSIS — M25552 Pain in left hip: Secondary | ICD-10-CM | POA: Diagnosis not present

## 2023-03-25 ENCOUNTER — Encounter (HOSPITAL_COMMUNITY): Payer: Self-pay | Admitting: Gastroenterology

## 2023-03-25 DIAGNOSIS — H353211 Exudative age-related macular degeneration, right eye, with active choroidal neovascularization: Secondary | ICD-10-CM | POA: Diagnosis not present

## 2023-03-26 ENCOUNTER — Telehealth: Payer: Self-pay | Admitting: Neurology

## 2023-03-26 NOTE — Telephone Encounter (Signed)
Pt's wife cancelled appt due to other health issues more important at this time.

## 2023-03-29 ENCOUNTER — Encounter: Payer: Self-pay | Admitting: Cardiology

## 2023-03-29 ENCOUNTER — Ambulatory Visit: Payer: Medicare HMO | Attending: Cardiology | Admitting: Cardiology

## 2023-03-29 VITALS — BP 128/60 | HR 72 | Ht 72.0 in | Wt 177.6 lb

## 2023-03-29 DIAGNOSIS — Z79899 Other long term (current) drug therapy: Secondary | ICD-10-CM | POA: Diagnosis not present

## 2023-03-29 DIAGNOSIS — R0602 Shortness of breath: Secondary | ICD-10-CM | POA: Diagnosis not present

## 2023-03-29 DIAGNOSIS — Z01810 Encounter for preprocedural cardiovascular examination: Secondary | ICD-10-CM | POA: Diagnosis not present

## 2023-03-29 DIAGNOSIS — R600 Localized edema: Secondary | ICD-10-CM | POA: Diagnosis not present

## 2023-03-29 MED ORDER — FUROSEMIDE 20 MG PO TABS: 20.0000 mg | ORAL_TABLET | Freq: Every day | ORAL | 3 refills | Status: DC

## 2023-03-29 NOTE — Patient Instructions (Addendum)
Medication Instructions:  Your physician has recommended you make the following change in your medication:  Start furosemide 20 mg daily Continue all other medications as prescribed  Labwork: BMET; BNP & Magnesium Level in 2 weeks (04/12/2023) Non-fasting [facility] ([address] [place])  Testing/Procedures: Your physician has requested that you have an echocardiogram. Echocardiography is a painless test that uses sound waves to create images of your heart. It provides your doctor with information about the size and shape of your heart and how well your heart's chambers and valves are working. This procedure takes approximately one hour. There are no restrictions for this procedure. Please do NOT wear cologne, perfume, aftershave, or lotions (deodorant is allowed). Please arrive 15 minutes prior to your appointment time.  Follow-Up: Your physician recommends that you schedule a follow-up appointment in: 3 weeks  Any Other Special Instructions Will Be Listed Below (If Applicable).  If you need a refill on your cardiac medications before your next appointment, please call your pharmacy.

## 2023-03-29 NOTE — Progress Notes (Signed)
Clinical Summary Cole Brown is a 77 y.o.male seen today for follow up of the following medical problems.    1.LE edema - started in September - no exertional. No othopnea, PND - 02/2023 admission with anemia, received 5 units pRBC.  - on norvasc 5mg  well prior to start of leg swelling.     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 Palpitations - prior symptoms seemed to be related to severe allergies, resolved with resolution of his allergy symptoms.      -no recent palpitations - compliant with meds     2. Chest pain - chest tightness episode about 3 weeks ago. Chest tightness midchest, felt heavy. Felt lightheaded, confused. No palpitations at the time. Tightness lasted about 20 minutes. Went away on its own.  - has had other episodes of tightness since that time.  - denies any DOE. Sedentary lifestyle. - history of fibromyalgia per his report.   CAD risk factors: HTN, mother MI at 41  12/2016 echo: LVEF 60-65%, abnormal diastoilc function,      - no recent symptoms.       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        4. HTN -he is compliant with meds  5. Anemia/GI bleed        SH: plays a bassoon Past Medical History:  Diagnosis Date   Anxiety    Depression    Diverticula of colon    2016 colonoscopy   Fibromyalgia    Fibromyalgia    History of GI diverticular bleed    Hypercholesteremia    Hypertension    Multiple gastric ulcers      Allergies  Allergen Reactions   Bee Pollen     Seasonal allergies   Plaquenil [Hydroxychloroquine Sulfate]     rash   Lodine [Etodolac] Swelling and Rash     Current Outpatient Medications  Medication Sig Dispense Refill   acetaminophen (TYLENOL 8 HOUR ARTHRITIS PAIN) 650 MG CR tablet Take 2,000 mg by mouth every 12 (twelve) hours.     amLODipine (NORVASC) 5 MG  tablet Take 1 tablet (5 mg total) by mouth daily. 90 tablet 3   amoxicillin-clavulanate (AUGMENTIN) 875-125 MG tablet Take 1 tablet by mouth 2 (two) times daily.     atenolol (TENORMIN) 25 MG tablet TAKE (1/2) TABLET BY MOUTH ONCE DAILY. (Patient taking differently: Take 12.5 mg by mouth daily.) 45 tablet 2   azelastine (ASTELIN) 0.1 % nasal spray Place 1 spray into both nostrils 2 (two) times daily.     Cholecalciferol (VITAMIN D) 50 MCG (2000 UT) CAPS Take 2,000 Units by mouth daily.     diphenhydrAMINE (BENADRYL) 25 MG tablet Take 25 mg by mouth as needed for allergies.      EPINEPHrine 0.3 mg/0.3 mL IJ SOAJ injection Inject 0.3 mg into the muscle as needed for anaphylaxis.     famotidine (PEPCID) 20 MG tablet Take 1 tablet (20 mg total) by mouth at bedtime. Takes as needed 30 tablet 1   FLUoxetine (PROZAC) 10 MG tablet Take 10 mg by mouth daily.     fluticasone (FLONASE) 50 MCG/ACT nasal spray Place 1-2 sprays into both nostrils daily.     folic acid (FOLVITE) 1 MG tablet TAKE 2 TABLETS BY MOUTH DAILY. (Patient taking differently: Take 2 mg by mouth daily.) 180 tablet 3   Investigational -  Study Medication Atorvastatin study med. PREVENTABLE trial     levocetirizine (XYZAL) 5 MG tablet Take 5 mg by mouth every evening.      methotrexate (RHEUMATREX) 2.5 MG tablet Take 8 tablets (20 mg total) by mouth once a week. 96 tablet 0   montelukast (SINGULAIR) 10 MG tablet Take 10 mg by mouth at bedtime.     Multiple Vitamins-Minerals (ICAPS AREDS 2 PO) Take 1 capsule by mouth daily.     Omega-3 Fatty Acids (FISH OIL) 1000 MG CAPS Take 1 capsule by mouth daily.     OVER THE COUNTER MEDICATION Prep h ointment and cream as needed     pantoprazole (PROTONIX) 40 MG tablet Take 1 tablet (40 mg total) by mouth 2 (two) times daily. 60 tablet 2   Testosterone (ANDROGEL TD) Place onto the skin daily.     triazolam (HALCION) 0.25 MG tablet Take 0.25 mg by mouth at bedtime.      No current facility-administered  medications for this visit.     Past Surgical History:  Procedure Laterality Date   allergy shots  weekly   BIOPSY  02/15/2023   Procedure: BIOPSY;  Surgeon: Dolores Frame, MD;  Location: [facility];  Service: Gastroenterology;;   CHOLECYSTECTOMY     COLONOSCOPY     Dr.Rehman   COLONOSCOPY N/A 01/27/2015   Rehman: multiple diverticula at sigmoid colon,ext hemorrhoids   COLONOSCOPY WITH PROPOFOL N/A 02/15/2023   Procedure: COLONOSCOPY WITH PROPOFOL;  Surgeon: Dolores Frame, MD;  Location: [facility];  Service: Gastroenterology;  Laterality: N/A;   ESOPHAGOGASTRODUODENOSCOPY (EGD) WITH PROPOFOL N/A 02/15/2023   Procedure: ESOPHAGOGASTRODUODENOSCOPY (EGD) WITH PROPOFOL;  Surgeon: Dolores Frame, MD;  Location: [facility];  Service: Gastroenterology;  Laterality: N/A;   EYE SURGERY Bilateral    partial cornea transplants    FLEXIBLE SIGMOIDOSCOPY  03/14/2023   Procedure: FLEXIBLE SIGMOIDOSCOPY;  Surgeon: Franky Macho, MD;  Location: [facility];  Service: Endoscopy;;   GALLBLADDER SURGERY  12/1993   HOT HEMOSTASIS  02/15/2023   Procedure: HOT HEMOSTASIS (ARGON PLASMA COAGULATION/BICAP);  Surgeon: [name], [name], MD;  Location: [facility];  Service: Gastroenterology;;   Susa Day  02/15/2023   Procedure: Susa Day;  Surgeon: [name], [name], MD;  Location: [facility];  Service: Gastroenterology;;   UPPER GASTROINTESTINAL ENDOSCOPY       Allergies  Allergen Reactions   Bee Pollen     Seasonal allergies   Plaquenil [Hydroxychloroquine Sulfate]     rash   Lodine [Etodolac] Swelling and Rash      Family History  Problem Relation Age of Onset   Heart disease Mother    Pancreatic cancer Mother    Prostate cancer Father    Healthy Sister    Parkinson's disease Brother    Asthma Sister    Healthy Sister      Social History Cole Brown reports that he has never smoked. He has never been exposed to  tobacco smoke. He has never used smokeless tobacco. Cole Brown reports that he does not currently use alcohol.   Review of Systems CONSTITUTIONAL: No weight loss, fever, chills, weakness or fatigue.  HEENT: Eyes: No visual loss, blurred vision, double vision or yellow sclerae.No hearing loss, sneezing, congestion, runny nose or sore throat.  SKIN: No rash or itching.  CARDIOVASCULAR: per hpi RESPIRATORY: No shortness of breath, cough or sputum.  GASTROINTESTINAL: No anorexia, nausea, vomiting or diarrhea. No abdominal pain or blood.  GENITOURINARY: No burning  on urination, no polyuria NEUROLOGICAL: No headache, dizziness, syncope, paralysis, ataxia, numbness or tingling in the extremities. No change in bowel or bladder control.  MUSCULOSKELETAL: No muscle, back pain, joint pain or stiffness.  LYMPHATICS: No enlarged nodes. No history of splenectomy.  PSYCHIATRIC: No history of depression or anxiety.  ENDOCRINOLOGIC: No reports of sweating, cold or heat intolerance. No polyuria or polydipsia.  Marland Kitchen   Physical Examination Today's Vitals   03/29/23 1305  BP: 128/60  Pulse: 72  SpO2: 97%  Weight: 177 lb 9.6 oz (80.6 kg)  Height: 6' (1.829 m)   Body mass index is 24.09 kg/m.  Gen: resting comfortably, no acute distress HEENT: no scleral icterus, pupils equal round and reactive, no palptable cervical adenopathy,  CV: RRR, no mrg, no jvd Resp: Clear to auscultation bilaterally GI: abdomen is soft, non-tender, non-distended, normal bowel sounds, no hepatosplenomegaly MSK: extremities are warm, 2+ bilateral LE edema Skin: warm, no rash Neuro:  no focal deficits Psych: appropriate affect   Diagnostic Studies  12/2016 echo Study Conclusions   - Left ventricle: The cavity size was normal. Systolic function was   normal. The estimated ejection fraction was in the range of 60%   to 65%. There was an increased relative contribution of atrial   contraction to ventricular filling. Mild  focal basal septal   hypertrophy. - Aorta: Very mild ascending aortic dilatation. Maximal diameter   3.8 cm. - Mitral valve: There was trivial regurgitation. - Tricuspid valve: There was mild regurgitation. - Pulmonary arteries: PA peak pressure: 36 mm Hg (S). - Systemic veins: IVC is dilated with normal respiratory variation.   Estimated RAP 8 mmHg.   Assessment and Plan  1. LE edema/SOB - severe bilateral edema unclear etiology - plan for echo to evaluate for any underlying cardiac dysfunction - start lasix 20mg  daily. 2 weeks check bmet/mg/bnp  2. Preoperative evaluation - considerin surgery for colvesiculal fistua - needs significant diuresis, f/u echo for any potential cardiac etiology for his LE edema. Would hold on surgery - very sedentary lifestyle, not able to assess functrional capacity by history. Once euvolemic may require stress testing to better risk statify   F/u 3 weeks.   [name], M.D.

## 2023-03-30 ENCOUNTER — Other Ambulatory Visit: Payer: Self-pay | Admitting: Physician Assistant

## 2023-03-30 DIAGNOSIS — M359 Systemic involvement of connective tissue, unspecified: Secondary | ICD-10-CM

## 2023-04-01 NOTE — Telephone Encounter (Signed)
Last Fill: 10/25/2022  Labs: 03/12/2023 RBC 2.54, Hgb 7.0, Hct 22.9, RDW 18.6, Sodium 131, Potassium 3.4, CO2 21, Glucose 126, Calcium 7.8  Next Visit: 04/23/2023  Last Visit: 10/25/2022  DX: Autoimmune disease   Current Dose per office note 10/25/2022: Methotrexate 8 tablets by mouth once weekly   Okay to refill Methotrexate?

## 2023-04-02 ENCOUNTER — Encounter: Payer: Self-pay | Admitting: General Surgery

## 2023-04-02 ENCOUNTER — Ambulatory Visit (INDEPENDENT_AMBULATORY_CARE_PROVIDER_SITE_OTHER): Payer: Medicare HMO | Admitting: General Surgery

## 2023-04-02 ENCOUNTER — Ambulatory Visit: Payer: Medicare HMO | Admitting: General Surgery

## 2023-04-02 VITALS — BP 113/71 | HR 72 | Temp 97.6°F | Resp 14 | Ht 72.0 in | Wt 174.0 lb

## 2023-04-02 DIAGNOSIS — N321 Vesicointestinal fistula: Secondary | ICD-10-CM

## 2023-04-02 NOTE — Patient Instructions (Signed)
Follow up here after cleared by Dr. Wyline Mood of Cardiology.

## 2023-04-02 NOTE — Progress Notes (Signed)
Subjective:     Cole Brown  Patient here for follow-up after his cardiology visit.  Overall, he states he is doing okay.  He did have an episode of abdominal pain and cramping after eating at [facility] yesterday.  He denies passing air when voiding.  He denies any fever or chills.  He continues to have bowel movements, though they are narrower in caliber.  This has not changed since I last saw him. Dr. Wyline Mood did reach out to me and states that he needs to do some further management of his medications prior to clearing him for surgery.  The patient and wife understand that.  They are getting a 2D echo later this month and following up with Dr. Wyline Mood in early November. Objective:    BP 113/71   Pulse 72   Temp 97.6 F (36.4 C) (Oral)   Resp 14   Ht 6' (1.829 m)   Wt 174 lb (78.9 kg)   SpO2 96%   BMI 23.60 kg/m   General:  alert, cooperative, and no distress  Abdomen is soft, nontender, nondistended. Cardiology notes reviewed     Assessment:    Colovesical fistula, narrowing of sigmoid colon.   Cardiac disease, currently being worked up by Dr. Wyline Mood Plan:   There is no need for urgent surgical intervention.  Patient does not have obstructive symptoms at the present time.  His colovesical fistula may have closed as he is not symptomatic at the present time. I told the patient and wife that surgery can be delayed until after his cardiac workup has been completed.  They understand this and agree.  Follow-up here after clearance by Dr. Wyline Mood of cardiology.

## 2023-04-04 ENCOUNTER — Ambulatory Visit: Payer: Medicare HMO | Admitting: Neurology

## 2023-04-10 ENCOUNTER — Ambulatory Visit: Payer: Medicare HMO | Attending: Cardiology

## 2023-04-10 DIAGNOSIS — R0602 Shortness of breath: Secondary | ICD-10-CM | POA: Diagnosis not present

## 2023-04-10 LAB — ECHOCARDIOGRAM COMPLETE
AR max vel: 2.81 cm2
AV Area VTI: 2.56 cm2
AV Area mean vel: 2.35 cm2
AV Mean grad: 5 mm[Hg]
AV Peak grad: 8.3 mm[Hg]
Ao pk vel: 1.44 m/s
Area-P 1/2: 2.74 cm2
Calc EF: 58.5 %
MV VTI: 2.14 cm2
S' Lateral: 2.4 cm
Single Plane A2C EF: 61.1 %
Single Plane A4C EF: 56.5 %

## 2023-04-12 DIAGNOSIS — R0602 Shortness of breath: Secondary | ICD-10-CM | POA: Diagnosis not present

## 2023-04-12 DIAGNOSIS — Z79899 Other long term (current) drug therapy: Secondary | ICD-10-CM | POA: Diagnosis not present

## 2023-04-16 LAB — BRAIN NATRIURETIC PEPTIDE: BNP: 84.3 pg/mL (ref 0.0–100.0)

## 2023-04-16 LAB — BASIC METABOLIC PANEL
BUN/Creatinine Ratio: 18 (ref 10–24)
BUN: 16 mg/dL (ref 8–27)
CO2: 22 mmol/L (ref 20–29)
Calcium: 8.7 mg/dL (ref 8.6–10.2)
Chloride: 100 mmol/L (ref 96–106)
Creatinine, Ser: 0.88 mg/dL (ref 0.76–1.27)
Glucose: 111 mg/dL — ABNORMAL HIGH (ref 70–99)
Potassium: 4.2 mmol/L (ref 3.5–5.2)
Sodium: 135 mmol/L (ref 134–144)
eGFR: 87 mL/min/{1.73_m2} (ref >59)

## 2023-04-16 LAB — MAGNESIUM: Magnesium: 2.2 mg/dL (ref 1.6–2.3)

## 2023-04-23 ENCOUNTER — Ambulatory Visit
Admission: EM | Admit: 2023-04-23 | Discharge: 2023-04-23 | Disposition: A | Discharge status: Home / Self Care | Payer: Medicare HMO

## 2023-04-23 ENCOUNTER — Ambulatory Visit: Payer: Medicare HMO | Attending: Rheumatology | Admitting: Rheumatology

## 2023-04-23 ENCOUNTER — Encounter: Payer: Self-pay | Admitting: Rheumatology

## 2023-04-23 ENCOUNTER — Encounter: Payer: Self-pay | Admitting: Emergency Medicine

## 2023-04-23 VITALS — BP 120/70 | HR 86 | Resp 15 | Ht 71.0 in | Wt 176.4 lb

## 2023-04-23 DIAGNOSIS — R251 Tremor, unspecified: Secondary | ICD-10-CM | POA: Diagnosis not present

## 2023-04-23 DIAGNOSIS — R760 Raised antibody titer: Secondary | ICD-10-CM | POA: Diagnosis not present

## 2023-04-23 DIAGNOSIS — M359 Systemic involvement of connective tissue, unspecified: Secondary | ICD-10-CM | POA: Diagnosis not present

## 2023-04-23 DIAGNOSIS — R4189 Other symptoms and signs involving cognitive functions and awareness: Secondary | ICD-10-CM

## 2023-04-23 DIAGNOSIS — M19042 Primary osteoarthritis, left hand: Secondary | ICD-10-CM

## 2023-04-23 DIAGNOSIS — G8929 Other chronic pain: Secondary | ICD-10-CM

## 2023-04-23 DIAGNOSIS — Z8719 Personal history of other diseases of the digestive system: Secondary | ICD-10-CM | POA: Diagnosis not present

## 2023-04-23 DIAGNOSIS — Z79899 Other long term (current) drug therapy: Secondary | ICD-10-CM

## 2023-04-23 DIAGNOSIS — R232 Flushing: Secondary | ICD-10-CM

## 2023-04-23 DIAGNOSIS — M545 Low back pain, unspecified: Secondary | ICD-10-CM

## 2023-04-23 DIAGNOSIS — Z8669 Personal history of other diseases of the nervous system and sense organs: Secondary | ICD-10-CM

## 2023-04-23 DIAGNOSIS — M797 Fibromyalgia: Secondary | ICD-10-CM | POA: Diagnosis not present

## 2023-04-23 DIAGNOSIS — R5383 Other fatigue: Secondary | ICD-10-CM | POA: Diagnosis not present

## 2023-04-23 DIAGNOSIS — M19041 Primary osteoarthritis, right hand: Secondary | ICD-10-CM

## 2023-04-23 DIAGNOSIS — E7800 Pure hypercholesterolemia, unspecified: Secondary | ICD-10-CM

## 2023-04-23 DIAGNOSIS — M7061 Trochanteric bursitis, right hip: Secondary | ICD-10-CM | POA: Diagnosis not present

## 2023-04-23 DIAGNOSIS — I10 Essential (primary) hypertension: Secondary | ICD-10-CM

## 2023-04-23 DIAGNOSIS — Z862 Personal history of diseases of the blood and blood-forming organs and certain disorders involving the immune mechanism: Secondary | ICD-10-CM

## 2023-04-23 DIAGNOSIS — Z889 Allergy status to unspecified drugs, medicaments and biological substances status: Secondary | ICD-10-CM

## 2023-04-23 DIAGNOSIS — M7062 Trochanteric bursitis, left hip: Secondary | ICD-10-CM

## 2023-04-23 DIAGNOSIS — I7300 Raynaud's syndrome without gangrene: Secondary | ICD-10-CM

## 2023-04-23 DIAGNOSIS — H353211 Exudative age-related macular degeneration, right eye, with active choroidal neovascularization: Secondary | ICD-10-CM | POA: Diagnosis not present

## 2023-04-23 DIAGNOSIS — H18519 Endothelial corneal dystrophy, unspecified eye: Secondary | ICD-10-CM

## 2023-04-23 MED ORDER — METHOTREXATE SODIUM 2.5 MG PO TABS: 10.0000 mg | ORAL_TABLET | ORAL | 2 refills | Status: DC

## 2023-04-23 NOTE — ED Triage Notes (Signed)
Shivers x 1 week, dry mouth and brain fog and fatigue.  Patient states he has body aches.    Has been taking tylenol.  Covid test on Thursday a week ago was negative.

## 2023-04-23 NOTE — Patient Instructions (Addendum)
Please reduce methotrexate to 4 tablets p.o. weekly.  Discontinue aspirin.  If there is an infection then stop methotrexate.  Standing Labs We placed an order today for your standing lab work.   Please have your standing labs drawn in December and every 3 months  Please have your labs drawn 2 weeks prior to your appointment so that the provider can discuss your lab results at your appointment, if possible.  Please note that you may see your imaging and lab results in MyChart before we have reviewed them. We will contact you once all results are reviewed. Please allow our office up to 72 hours to thoroughly review all of the results before contacting the office for clarification of your results.  WALK-IN LAB HOURS  Monday through Thursday from 8:00 am -12:30 pm and 1:00 pm-5:00 pm and Friday from 8:00 am-12:00 pm.  Patients with office visits requiring labs will be seen before walk-in labs.  You may encounter longer than normal wait times. Please allow additional time. Wait times may be shorter on  Monday and Thursday afternoons.  We do not book appointments for walk-in labs. We appreciate your patience and understanding with our staff.   Labs are drawn by [facility]. Please bring your co-pay at the time of your lab draw.  You may receive a bill from [facility] for your lab work.  Please note if you are on Hydroxychloroquine and and an order has been placed for a Hydroxychloroquine level,  you will need to have it drawn 4 hours or more after your last dose.  If you wish to have your labs drawn at another location, please call the office 24 hours in advance so we can fax the orders.  The office is located at [address], [address], [place], [place] [address]   If you have any questions regarding directions or hours of operation,  please call [phone].   As a reminder, please drink plenty of water prior to coming for your lab work. Thanks!   Vaccines You are taking a medication(s) that  can suppress your immune system.  The following immunizations are recommended: Flu annually Covid-19  Td/Tdap (tetanus, diphtheria, pertussis) every 10 years Pneumonia (Prevnar 15 then Pneumovax 23 at least 1 year apart.  Alternatively, can take Prevnar 20 without needing additional dose) Shingrix: 2 doses from 4 weeks to 6 months apart  Please check with your PCP to make sure you are up to date.   If you have signs or symptoms of an infection or start antibiotics: First, call your PCP for workup of your infection. Hold your medication through the infection, until you complete your antibiotics, and until symptoms resolve if you take the following: Injectable medication (Actemra, Benlysta, Cimzia, Cosentyx, Enbrel, Humira, Kevzara, Orencia, Remicade, Simponi, Stelara, Taltz, Tremfya) Methotrexate Leflunomide (Arava) Mycophenolate (Cellcept) Osborne [place], or Rinvoq   Low Back Sprain or Strain Rehab Ask your health care provider which exercises are safe for you. Do exercises exactly as told by your health care provider and adjust them as directed. It is normal to feel mild stretching, pulling, tightness, or discomfort as you do these exercises. Stop right away if you feel sudden pain or your pain gets worse. Do not begin these exercises until told by your health care provider. Stretching and range-of-motion exercises These exercises warm up your muscles and joints and improve the movement and flexibility of your back. These exercises also help to relieve pain, numbness, and tingling. Lumbar rotation  Lie on your back  on a firm bed or the floor with your knees bent. Straighten your arms out to your sides so each arm forms a 90-degree angle (right angle) with a side of your body. Slowly move (rotate) both of your knees to one side of your body until you feel a stretch in your lower back (lumbar). Try not to let your shoulders lift off the floor. Hold this position for __________  seconds. Tense your abdominal muscles and slowly move your knees back to the starting position. Repeat this exercise on the other side of your body. Repeat __________ times. Complete this exercise __________ times a day. Single knee to chest  Lie on your back on a firm bed or the floor with both legs straight. Bend one of your knees. Use your hands to move your knee up toward your chest until you feel a gentle stretch in your lower back and buttock. Hold your leg in this position by holding on to the front of your knee. Keep your other leg as straight as possible. Hold this position for __________ seconds. Slowly return to the starting position. Repeat with your other leg. Repeat __________ times. Complete this exercise __________ times a day. Prone extension on elbows  Lie on your abdomen on a firm bed or the floor (prone position). Prop yourself up on your elbows. Use your arms to help lift your chest up until you feel a gentle stretch in your abdomen and your lower back. This will place some of your body weight on your elbows. If this is uncomfortable, try stacking pillows under your chest. Your hips should stay down, against the surface that you are lying on. Keep your hip and back muscles relaxed. Hold this position for __________ seconds. Slowly relax your upper body and return to the starting position. Repeat __________ times. Complete this exercise __________ times a day. Strengthening exercises These exercises build strength and endurance in your back. Endurance is the ability to use your muscles for a long time, even after they get tired. Pelvic tilt This exercise strengthens the muscles that lie deep in the abdomen. Lie on your back on a firm bed or the floor with your legs extended. Bend your knees so they are pointing toward the ceiling and your feet are flat on the floor. Tighten your lower abdominal muscles to press your lower back against the floor. This motion will tilt  your pelvis so your tailbone points up toward the ceiling instead of pointing to your feet or the floor. To help with this exercise, you may place a small towel under your lower back and try to push your back into the towel. Hold this position for __________ seconds. Let your muscles relax completely before you repeat this exercise. Repeat __________ times. Complete this exercise __________ times a day. Alternating arm and leg raises  Get on your hands and knees on a firm surface. If you are on a hard floor, you may want to use padding, such as an exercise mat, to cushion your knees. Line up your arms and legs. Your hands should be directly below your shoulders, and your knees should be directly below your hips. Lift your left leg behind you. At the same time, raise your right arm and straighten it in front of you. Do not lift your leg higher than your hip. Do not lift your arm higher than your shoulder. Keep your abdominal and back muscles tight. Keep your hips facing the ground. Do not arch your back. Keep your  balance carefully, and do not hold your breath. Hold this position for __________ seconds. Slowly return to the starting position. Repeat with your right leg and your left arm. Repeat __________ times. Complete this exercise __________ times a day. Abdominal set with straight leg raise  Lie on your back on a firm bed or the floor. Bend one of your knees and keep your other leg straight. Tense your abdominal muscles and lift your straight leg up, 4-6 inches (10-15 cm) off the ground. Keep your abdominal muscles tight and hold this position for __________ seconds. Do not hold your breath. Do not arch your back. Keep it flat against the ground. Keep your abdominal muscles tense as you slowly lower your leg back to the starting position. Repeat with your other leg. Repeat __________ times. Complete this exercise __________ times a day. Single leg lower with bent knees Lie on your  back on a firm bed or the floor. Tense your abdominal muscles and lift your feet off the floor, one foot at a time, so your knees and hips are bent in 90-degree angles (right angles). Your knees should be over your hips and your lower legs should be parallel to the floor. Keeping your abdominal muscles tense and your knee bent, slowly lower one of your legs so your toe touches the ground. Lift your leg back up to return to the starting position. Do not hold your breath. Do not let your back arch. Keep your back flat against the ground. Repeat with your other leg. Repeat __________ times. Complete this exercise __________ times a day. Posture and body mechanics Good posture and healthy body mechanics can help to relieve stress in your body's tissues and joints. Body mechanics refers to the movements and positions of your body while you do your daily activities. Posture is part of body mechanics. Good posture means: Your spine is in its natural S-curve position (neutral). Your shoulders are pulled back slightly. Your head is not tipped forward (neutral). Follow these guidelines to improve your posture and body mechanics in your everyday activities. Standing  When standing, keep your spine neutral and your feet about hip-width apart. Keep a slight bend in your knees. Your ears, shoulders, and hips should line up. When you do a task in which you stand in one place for a long time, place one foot up on a stable object that is 2-4 inches (5-10 cm) high, such as a footstool. This helps keep your spine neutral. Sitting  When sitting, keep your spine neutral and keep your feet flat on the floor. Use a footrest, if necessary, and keep your thighs parallel to the floor. Avoid rounding your shoulders, and avoid tilting your head forward. When working at a desk or a computer, keep your desk at a height where your hands are slightly lower than your elbows. Slide your chair under your desk so you are close  enough to maintain good posture. When working at a computer, place your monitor at a height where you are looking straight ahead and you do not have to tilt your head forward or downward to look at the screen. Resting When lying down and resting, avoid positions that are most painful for you. If you have pain with activities such as sitting, bending, stooping, or squatting, lie in a position in which your body does not bend very much. For example, avoid curling up on your side with your arms and knees near your chest (fetal position). If you have pain with  activities such as standing for a long time or reaching with your arms, lie with your spine in a neutral position and bend your knees slightly. Try the following positions: Lying on your side with a pillow between your knees. Lying on your back with a pillow under your knees. Lifting  When lifting objects, keep your feet at least shoulder-width apart and tighten your abdominal muscles. Bend your knees and hips and keep your spine neutral. It is important to lift using the strength of your legs, not your back. Do not lock your knees straight out. Always ask for help to lift heavy or awkward objects. This information is not intended to replace advice given to you by your health care provider. Make sure you discuss any questions you have with your health care provider. Document Revised: 10/08/2022 Document Reviewed: 08/22/2020 Elsevier Patient Education  2024 [employer].

## 2023-04-23 NOTE — Discharge Instructions (Addendum)
As discussed, it is recommended that he follow-up with neurology for further evaluation.  You may call [facility] to reschedule appointment. Follow-up with his PCP as soon as possible for repeat blood work. If he experiences worsening symptoms to include fever, chills, worsening tremors/shivering, becomes confused, develop chest pain, shortness of breath, difficulty breathing, please go to the emergency department immediately for further evaluation. Follow-up as needed.

## 2023-04-23 NOTE — ED Provider Notes (Signed)
[facility]    CSN: [id number] Arrival date & time: 04/23/23  1757      History   Chief Complaint No chief complaint on file.   HPI Cole Brown is a 77 y.o. male.   The history is provided by the spouse.   Patient brought in by his spouse for complaints of shivering, dry mouth, brain fog and fatigue.  Shivering has been present for the past week followed by hot flashes per the patient's spouse.  She also states that patient has been experiencing dry mouth, brain fog and fatigue.  Per review of the patient's chart, patient has been seen in the emergency department on multiple occasions for fatigue, of note, he was seen for hyponatremia in August.  Patient spouse reports patient experienced an episode of "shivering" so bad this evening, that he could not hold his fork.  Spouse reports patient has tested negative for both influenza and COVID over the past week.  Patient was seen by rheumatology today.  Patient is currently on methotrexate for fibromyalgia.  Most recent chemistry profile dated 04/12/2023 is within normal limits.  Most recent CBC was done on 03/08/2023, H&H 7/22.9.  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   Gastrointestinal hemorrhage 02/15/2023   Protein-calorie malnutrition, severe 02/14/2023   Acute blood loss anemia 02/12/2023   Perforation of sigmoid colon (HCC) 01/08/2023   Acute post-hemorrhagic anemia 01/08/2023   Diverticulitis 01/03/2023   Hyponatremia 01/03/2023   Hematochezia 12/25/2022   Diverticulosis of colon without hemorrhage 12/25/2022   Diarrhea 03/30/2021   Hemorrhoids  03/30/2021   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   Lower abdominal pain 07/06/2019   Autoimmune disease (HCC) 09/15/2018   [name] disease without gangrene 09/15/2018   Hypercholesteremia 08/21/2018   History of diverticulitis 08/21/2018   History of iron deficiency anemia 08/21/2018   Fibromyalgia 08/21/2018   History of multiple allergies 08/21/2018   History of sleep apnea 08/21/2018   Rectal bleed 11/14/2016   Essential hypertension 11/14/2016   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    Past Surgical History:  Procedure Laterality Date   allergy shots  weekly   BIOPSY  02/15/2023   Procedure: BIOPSY;  Surgeon: Dolores Frame, MD;  Location: [facility];  Service: Gastroenterology;;   CHOLECYSTECTOMY     COLONOSCOPY     Dr.Rehman   COLONOSCOPY N/A 01/27/2015   Rehman: multiple diverticula at sigmoid colon,ext hemorrhoids   COLONOSCOPY WITH PROPOFOL N/A 02/15/2023   Procedure: COLONOSCOPY WITH PROPOFOL;  Surgeon: Dolores Frame, MD;  [facility]: [facility];  Service: Gastroenterology;  Laterality: N/A;   ESOPHAGOGASTRODUODENOSCOPY (EGD) WITH PROPOFOL N/A 02/15/2023   Procedure: ESOPHAGOGASTRODUODENOSCOPY (EGD) WITH PROPOFOL;  Surgeon: Dolores Frame, MD;  Location: [facility];  Service: Gastroenterology;  Laterality: N/A;   EYE SURGERY Bilateral    partial cornea transplants    FLEXIBLE SIGMOIDOSCOPY  03/14/2023   Procedure: FLEXIBLE SIGMOIDOSCOPY;  Surgeon: Franky Macho, MD;  Location: [facility];  Service: Endoscopy;;   GALLBLADDER SURGERY  12/1993   HOT HEMOSTASIS  02/15/2023  Procedure: HOT HEMOSTASIS (ARGON PLASMA COAGULATION/BICAP);  Surgeon: [name], [name], MD;  Location: [facility];  Service: Gastroenterology;;   Susa Day  02/15/2023   Procedure: Susa Day;  Surgeon: [name], [name], MD;  Location: [facility];  Service: Gastroenterology;;   UPPER GASTROINTESTINAL ENDOSCOPY          Home Medications    Prior to Admission medications   Medication Sig Start Date End Date Taking? Authorizing Provider  acetaminophen (TYLENOL) 500 MG tablet Take 1,000 mg by mouth every 12 (twelve) hours.    [provider]  amLODipine (NORVASC) 5 MG tablet Take 1 tablet (5 mg total) by mouth daily. 08/28/22   [name], NP  atenolol (TENORMIN) 25 MG tablet TAKE (1/2) TABLET BY MOUTH ONCE DAILY. Patient taking differently: Take 12.5 mg by mouth daily. 12/03/22   [name], MD  azelastine (ASTELIN) 0.1 % nasal spray Place 1 spray into both nostrils 2 (two) times daily. 09/17/19   [provider]  Cholecalciferol (VITAMIN D) 50 MCG (2000 UT) CAPS Take 2,000 Units by mouth daily.    [provider]  diphenhydrAMINE (BENADRYL) 25 MG tablet Take 25 mg by mouth daily as needed for allergies.    [provider]  EPINEPHrine 0.3 mg/0.3 mL IJ SOAJ injection Inject 0.3 mg into the muscle as needed for anaphylaxis. 10/14/19   [provider]  famotidine (PEPCID) 20 MG tablet Take 1 tablet (20 mg total) by mouth at bedtime. Takes as needed 02/18/23   [name], MD  FLUoxetine (PROZAC) 10 MG tablet Take 10 mg by mouth daily.    [provider]  fluticasone (FLONASE) 50 MCG/ACT nasal spray Place 1-2 sprays into both nostrils daily. 07/27/20   [provider]  folic acid (FOLVITE) 1 MG tablet TAKE 2 TABLETS BY MOUTH DAILY. Patient taking differently: Take 2 mg by mouth daily. 10/12/22   Gearldine Bienenstock, PA-C  furosemide (LASIX) 20 MG tablet Take 1 tablet (20 mg total) by mouth daily. 03/29/23   [name], MD  Investigational - Study Medication Atorvastatin study med. PREVENTABLE trial    [provider]  levocetirizine (XYZAL) 5 MG tablet Take 5 mg by mouth every evening.     [provider]  methotrexate (RHEUMATREX) 2.5 MG tablet Take 4 tablets (10 mg total) by mouth once a week. 04/23/23   Pollyann Savoy, MD  montelukast (SINGULAIR) 10 MG tablet Take 10 mg by mouth at bedtime.    [provider]  Multiple Vitamins-Minerals (ICAPS AREDS 2 PO) Take 1 capsule by mouth daily.    [provider]  Omega-3 Fatty Acids (FISH OIL) 1000 MG CAPS Take 1 capsule by mouth daily.    [provider]  OVER THE COUNTER MEDICATION Prep h ointment and cream as needed    [provider]  pantoprazole (PROTONIX) 40 MG tablet Take 1 tablet (40 mg total) by mouth 2 (two) times daily. 02/18/23 02/18/24  [name], MD  Testosterone (ANDROGEL TD) Place onto the skin daily.    [provider]  triazolam (HALCION) 0.25 MG tablet Take 0.25 mg by mouth at bedtime.     [provider]    Family History Family History  Problem Relation Age of Onset   Heart disease Mother    Pancreatic cancer Mother    Prostate cancer Father    Healthy Sister    Parkinson's disease Brother    Asthma Sister    Healthy Sister  Social History Social History   Tobacco Use   Smoking status: Never    Passive exposure: Never   Smokeless tobacco: Never  Vaping Use   Vaping status: Never Used  Substance Use Topics   Alcohol use: Not Currently   Drug use: No     Allergies   Bee pollen, Plaquenil [hydroxychloroquine sulfate], and Lodine [etodolac]   Review of Systems Review of Systems Per HPI  Physical Exam Triage Vital Signs ED Triage Vitals  Encounter Vitals Group     BP 04/23/23 1839 (!) 155/77     Systolic BP Percentile --      Diastolic BP Percentile --      Pulse Rate 04/23/23 1839 (!) 113     Resp 04/23/23 1839 18     Temp 04/23/23 1839 99.9 F (37.7 C)     Temp Source 04/23/23 1839 Oral     SpO2 04/23/23 1839 99 %     Weight --      Height --      Head Circumference --      Peak Flow --      Pain Score 04/23/23 1842 8     Pain Loc --      Pain Education --      Exclude from Growth Chart --    No data found.  Updated Vital Signs BP (!)  155/77 (BP Location: Right Arm)   Pulse (!) 113   Temp 99.9 F (37.7 C) (Oral)   Resp 18   SpO2 99%   Visual Acuity Right Eye Distance:   Left Eye Distance:   Bilateral Distance:    Right Eye Near:   Left Eye Near:    Bilateral Near:     Physical Exam Vitals and nursing note reviewed.  Constitutional:      General: He is not in acute distress.    Appearance: Normal appearance.  HENT:     Head: Normocephalic.     Right Ear: Tympanic membrane, ear canal and external ear normal.     Left Ear: Tympanic membrane, ear canal and external ear normal.     Nose: Nose normal.     Mouth/Throat:     Mouth: Mucous membranes are moist.  Eyes:     Extraocular Movements: Extraocular movements intact.     Pupils: Pupils are equal, round, and reactive to light.  Cardiovascular:     Rate and Rhythm: Regular rhythm. Tachycardia present.     Pulses: Normal pulses.     Heart sounds: Normal heart sounds.  Pulmonary:     Effort: Pulmonary effort is normal.     Breath sounds: Normal breath sounds.  Abdominal:     General: Bowel sounds are normal.     Palpations: Abdomen is soft.     Tenderness: There is no abdominal tenderness.  Musculoskeletal:     Cervical back: Normal range of motion.  Lymphadenopathy:     Cervical: No cervical adenopathy.  Skin:    General: Skin is warm and dry.  Neurological:     General: No focal deficit present.     Mental Status: He is alert and oriented to person, place, and time.  Psychiatric:        Mood and Affect: Mood normal.        Behavior: Behavior normal.      UC Treatments / Results  Labs (all labs ordered are listed, but only abnormal results are displayed) Labs Reviewed - No data to display  EKG  Radiology No results found.  Procedures Procedures (including critical care time)  Medications Ordered in UC Medications - No data to display  Initial Impression / Assessment and Plan / UC Course  I have reviewed the triage vital signs  and the nursing notes.  Pertinent labs & imaging results that were available during my care of the patient were reviewed by me and considered in my medical decision making (see chart for details).  Patient with ongoing symptoms that been present for the past several months.  Patient's wife reports patient is experiencing hot flashes along with tremors that have been present for the past week. Greater than 45 minutes reviewing chart and discussion with the patient's wife regarding recommendation for follow-up with neurology given the patient's symptoms of brain fog, tremor, and temperature changes.  Patient's wife reports patient was scheduled to see neurology, but appointment was canceled.  Supportive care recommendations were provided and discussed with the patient's spouse to include increasing his fluid intake, Tylenol for pain or discomfort, and to follow-up with his PCP to repeat his blood count.  Patient's spouse was given strict ER follow-up precautions.  Spouse was in agreement with this plan of care and verbalized understanding.  All questions were answered.  Patient stable for discharge.  Final Clinical Impressions(s) / UC Diagnoses   Final diagnoses:  Hot flashes  Other fatigue  Brain fog  Occasional tremors     Discharge Instructions      As discussed, it is recommended that he follow-up with neurology for further evaluation.  You may call [facility] to reschedule appointment. Follow-up with his PCP as soon as possible for repeat blood work. If he experiences worsening symptoms to include fever, chills, worsening tremors/shivering, becomes confused, develop chest pain, shortness of breath, difficulty breathing, please go to the emergency department immediately for further evaluation. Follow-up as needed.     ED Prescriptions   None    PDMP not reviewed this encounter.   Abran Cantor, NP 04/23/23 1958

## 2023-04-25 ENCOUNTER — Encounter: Payer: Self-pay | Admitting: Student

## 2023-04-25 ENCOUNTER — Ambulatory Visit: Payer: Medicare HMO | Attending: Student | Admitting: Student

## 2023-04-25 ENCOUNTER — Encounter: Payer: Self-pay | Admitting: *Deleted

## 2023-04-25 VITALS — BP 120/70 | HR 62 | Ht 71.0 in | Wt 178.4 lb

## 2023-04-25 DIAGNOSIS — R413 Other amnesia: Secondary | ICD-10-CM | POA: Diagnosis not present

## 2023-04-25 DIAGNOSIS — R600 Localized edema: Secondary | ICD-10-CM | POA: Diagnosis not present

## 2023-04-25 DIAGNOSIS — Z87898 Personal history of other specified conditions: Secondary | ICD-10-CM

## 2023-04-25 DIAGNOSIS — I10 Essential (primary) hypertension: Secondary | ICD-10-CM | POA: Diagnosis not present

## 2023-04-25 DIAGNOSIS — Z79899 Other long term (current) drug therapy: Secondary | ICD-10-CM | POA: Diagnosis not present

## 2023-04-25 DIAGNOSIS — Z01810 Encounter for preprocedural cardiovascular examination: Secondary | ICD-10-CM | POA: Diagnosis not present

## 2023-04-25 DIAGNOSIS — D509 Iron deficiency anemia, unspecified: Secondary | ICD-10-CM | POA: Diagnosis not present

## 2023-04-25 NOTE — Progress Notes (Signed)
Cardiology Office Note    Date:  04/25/2023  ID:  Cole Brown, DOB 09-Jul-1945, MRN [id number] Cardiologist: Dina Rich, MD    History of Present Illness:    Cole Brown is a 77 y.o. male with past medical history of palpitations, history of chest pain (prior symptoms in 2018 but no recent recurrence), fibromyalgia, [name], HTN, anemia and history of GI bleed who presents to the office today for 3-week follow-up.  He was examined by Dr. Wyline Mood in 03/2023 and reported worsening lower extremity edema for the past month. He had been hospitalized in 02/2023 for acute blood loss anemia and received 5 units pRBC's during that timeframe. He did have 2+ pitting edema on examination and was started on Lasix 20 mg daily with plans for a follow-up echocardiogram for reassessment of any structural abnormalities. He did need preoperative cardiac clearance for colovesicular fistula and was recommended to reassess this following diuresis. If unable to assess functional capacity by history, may need to consider stress testing to risk-stratify. Follow-up labs did show his BNP was normal at 84 and creatinine was stable at 0.88. Repeat echo showed a preserved EF of 60 to 65% with no regional wall motion abnormalities. RV function was normal with normal PASP. He had borderline dilatation of the aortic root at 38 mm and borderline dilatation of the ascending aorta at 39 mm but no significant valve abnormalities.  He did present to Urgent Care on 04/23/2023 for evaluation of "brain fog", fatigue and bodyaches. Had recently been tested for influenza and COVID with both being negative. No acute abnormalities were noted and he was encouraged to follow-up with Neurology as an outpatient.  In talking with the patient and his wife today, she reports his biggest issue over the past several months has been "brain fog", memory issues and progressive weakness. Reports that he stays in bed throughout a majority of the  day. Was previously having chills last week which prompted them to seek evaluation as discussed above and workup was unrevealing at that time. She is concerned that his chills are due to progressive anemia and we will check a CBC with upcoming labs.  He denies any specific chest pain or dyspnea on exertion but has been very inactive as discussed above. They recently went on a trip and she reports he was transported by wheelchair throughout the airport. He denies any specific orthopnea or PND and his lower extremity edema has significantly improved with the use of Lasix.  Studies Reviewed:   EKG: EKG is not ordered today.  Echocardiogram: 03/2023 IMPRESSIONS     1. Left ventricular ejection fraction, by estimation, is 60 to 65%. Left  ventricular ejection fraction by 3D volume is 70 %. The left ventricle has  normal function. The left ventricle has no regional wall motion  abnormalities. Left ventricular diastolic   parameters were normal.   2. Right ventricular systolic function is normal. The right ventricular  size is normal. There is normal pulmonary artery systolic pressure.   3. The mitral valve is normal in structure. No evidence of mitral valve  regurgitation. No evidence of mitral stenosis.   4. The aortic valve is tricuspid. Aortic valve regurgitation is not  visualized. No aortic stenosis is present.   5. Aortic dilatation noted. There is borderline dilatation of the aortic  root, measuring 38 mm. There is borderline dilatation of the ascending  aorta, measuring 39 mm.   6. The inferior vena cava is normal in size with  greater than 50%  respiratory variability, suggesting right atrial pressure of 3 mmHg.   Comparison(s): No significant change from prior study.    Physical Exam:   VS:  BP 120/70   Pulse 62   Ht 5\' 11"  (1.803 m)   Wt 178 lb 6.4 oz (80.9 kg)   SpO2 97%   BMI 24.88 kg/m    Wt Readings from Last 3 Encounters:  04/25/23 178 lb 6.4 oz (80.9 kg)  04/23/23  176 lb 6.4 oz (80 kg)  04/02/23 174 lb (78.9 kg)     GEN: Well nourished, well developed male appearing in no acute distress NECK: No JVD; No carotid bruits CARDIAC: RRR, no murmurs, rubs, gallops RESPIRATORY:  Clear to auscultation without rales, wheezing or rhonchi  ABDOMEN: Appears non-distended. No obvious abdominal masses. EXTREMITIES: No clubbing or cyanosis. Trace lower extremity edema.  Distal pedal pulses are 2+ bilaterally.   Assessment and Plan:   1. Lower Extremity Edema - This occurred following a recent admission for anemia during which he received 5 units pRBC's. Follow-up echocardiogram showed a preserved EF of 60 to 65% with normal RV function and normal PASP. He did have borderline dilatation of the aortic root which will be followed over time.  - His lower extremity edema has significantly improved by examination with only trace edema today. Will continue Lasix at current dose of 20 mg daily and recheck renal function and electrolytes. Will also add on a CBC to follow-up labs as his wife is concerned about progressive anemia as this has not been rechecked by his PCP and was at 7.0 on 03/12/2023.  2. Preoperative Cardiac Clearance for Colovesical Fistula - He is unable to perform more than 4 METS of activity given his decline over the past several months. Reviewed options with the patient and his wife and will plan to obtain a Lexiscan Myoview for ischemic evaluation. Will forward a copy of results to Dr. Lovell Sheehan once available. If no high-risk findings, would anticipate he could proceed with planned surgery given no recent anginal symptoms and due to improvement in volume status.  3. Palpitations - He denies any recent palpitations and is in normal sinus rhythm by examination today. Remains on Atenolol 12.5 mg daily.  4. HTN - Blood pressure is well-controlled at 120/70 during today's visit. Continue current medical therapy with Amlodipine 5mg  daily, Atenolol 12.5 mg daily  and Lasix 20 mg daily.  5. Memory Loss - His wife reports he has experienced progressive "brain fog" and memory loss over the past several months. Encouraged them to reschedule follow-up with Neurology for further evaluation of this.  Signed, Ellsworth Lennox, PA-C

## 2023-04-25 NOTE — Patient Instructions (Signed)
Medication Instructions:  Your physician recommends that you continue on your current medications as directed. Please refer to the Current Medication list given to you today.  *If you need a refill on your cardiac medications before your next appointment, please call your pharmacy*   Lab Work: Your physician recommends that you return for lab work in: Today Designer, fashion/clothing)   If you have labs (blood work) drawn today and your tests are completely normal, you will receive your results only by: MyChart Message (if you have MyChart) OR A paper copy in the mail If you have any lab test that is abnormal or we need to change your treatment, we will call you to review the results.   Testing/Procedures: Your physician has requested that you have en exercise stress myoview. For further information please visit [email or web address] Please follow instruction sheet, as given.    Follow-Up: At [facility], you and your health needs are our priority.  As part of our continuing mission to provide you with exceptional heart care, we have created designated Provider Care Teams.  These Care Teams include your primary Cardiologist (physician) and Advanced Practice Providers (APPs -  Physician Assistants and Nurse Practitioners) who all work together to provide you with the care you need, when you need it.  We recommend signing up for the patient portal called "MyChart".  Sign up information is provided on this After Visit Summary.  MyChart is used to connect with patients for Virtual Visits (Telemedicine).  Patients are able to view lab/test results, encounter notes, upcoming appointments, etc.  Non-urgent messages can be sent to your provider as well.   To learn more about what you can do with MyChart, go to [email or web address].    Your next appointment:   3 - 4 month(s)  Provider:   Dina Rich, MD    Other Instructions Thank you for choosing [facility]!

## 2023-04-26 ENCOUNTER — Telehealth: Payer: Self-pay | Admitting: *Deleted

## 2023-04-26 LAB — COMPREHENSIVE METABOLIC PANEL
ALT: 39 [IU]/L (ref 0–44)
AST: 43 [IU]/L — ABNORMAL HIGH (ref 0–40)
Albumin: 3 g/dL — ABNORMAL LOW (ref 3.8–4.8)
Alkaline Phosphatase: 158 [IU]/L — ABNORMAL HIGH (ref 44–121)
BUN/Creatinine Ratio: 16 (ref 10–24)
BUN: 18 mg/dL (ref 8–27)
Bilirubin Total: 0.3 mg/dL (ref 0.0–1.2)
CO2: 20 mmol/L (ref 20–29)
Calcium: 8.5 mg/dL — ABNORMAL LOW (ref 8.6–10.2)
Chloride: 100 mmol/L (ref 96–106)
Creatinine, Ser: 1.12 mg/dL (ref 0.76–1.27)
Globulin, Total: 3.3 g/dL (ref 1.5–4.5)
Glucose: 105 mg/dL — ABNORMAL HIGH (ref 70–99)
Potassium: 4.8 mmol/L (ref 3.5–5.2)
Sodium: 134 mmol/L (ref 134–144)
Total Protein: 6.3 g/dL (ref 6.0–8.5)
eGFR: 67 mL/min/{1.73_m2} (ref >59)

## 2023-04-26 LAB — CBC
Hematocrit: 25.2 % — ABNORMAL LOW (ref 37.5–51.0)
Hemoglobin: 7.3 g/dL — ABNORMAL LOW (ref 13.0–17.7)
MCH: 22.9 pg — ABNORMAL LOW (ref 26.6–33.0)
MCHC: 29 g/dL — ABNORMAL LOW (ref 31.5–35.7)
MCV: 79 fL (ref 79–97)
Platelets: 343 10*3/uL (ref 150–450)
RBC: 3.19 x10E6/uL — ABNORMAL LOW (ref 4.14–5.80)
RDW: 17.9 % — ABNORMAL HIGH (ref 11.6–15.4)
WBC: 7.7 10*3/uL (ref 3.4–10.8)

## 2023-04-26 MED ORDER — FUROSEMIDE 20 MG PO TABS: 20.0000 mg | ORAL_TABLET | ORAL | 3 refills | Status: DC

## 2023-04-26 NOTE — Telephone Encounter (Signed)
Wife notified.

## 2023-04-26 NOTE — Telephone Encounter (Signed)
-----   Message from Ellsworth Lennox sent at 04/26/2023  7:22 AM EST ----- Please let the patient and his wife know that his kidney function is starting to suggest mild dehydration. He has been taking Lasix 20 mg daily and would have him take 20 mg every other day. Sodium and potassium are within a normal range. His alkaline phosphatase is slightly elevated but similar to prior values when compared to his hospitalization in 01/2023. Of most concern is his hemoglobin still remains low at 7.3. I have tagged his Gastroenterologist in this message and I would encourage them to follow-up with the office as well to make a follow-up appointment.

## 2023-04-29 ENCOUNTER — Telehealth (INDEPENDENT_AMBULATORY_CARE_PROVIDER_SITE_OTHER): Payer: Self-pay

## 2023-04-29 NOTE — Telephone Encounter (Signed)
Referral sent, they will contact patient with apt

## 2023-04-29 NOTE — Telephone Encounter (Signed)
Dear Dr Lovell Sheehan and Venora Maples  Thank you all for taking care of this patient .   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 His Hbg remains low although stable ( around 7)  At this time its imperative to r/o colonic malignancy leading to anemia . I understand he is awaiting cardiac workup before surgery.    My options for his anemia are limited as colonic malignancy needs to be ruled out with surgical intervention . I can refer him to Hematology until than for Iron /blood transfusion to optimize the patient prior to surgery   Let me your thoughts  =================  Crystal : please inform patient that hemoglobin is low but similar to before , please ensure he is not taking NSAIDs like previously. I recommend him to be seen my hematology until he can be cleared by cardiology for surgery . I will be happy to see him in clinic as well    Dewayne Hatch; please refer the patient to hematology for symptomatic anemia .

## 2023-04-29 NOTE — Telephone Encounter (Signed)
Patient wife called today to follow up on the message from Cardiology regarding needing a follow up with Gi since patient having symptomatic anemia/ Fatigue.   04/26/2023 ---- Message from Ellsworth Lennox sent at 04/26/2023  7:22 AM EST ----- Please let the patient and his wife know that his kidney function is starting to suggest mild dehydration. He has been taking Lasix 20 mg daily and would have him take 20 mg every other day. Sodium and potassium are within a normal range. His alkaline phosphatase is slightly elevated but similar to prior values when compared to his hospitalization in 01/2023. Of most concern is his hemoglobin still remains low at 7.3. I have tagged his Gastroenterologist in this message and I would encourage them to follow-up with the office as well to make a follow-up appointment.

## 2023-04-30 NOTE — Telephone Encounter (Signed)
I spoke with the patient wife Cole Brown (on [place]) made her aware per Dr. Tasia Catchings:   The hemoglobin is low but similar to before , please ensure he is not taking NSAIDs like previously. I recommend him to be seen my hematology until he can be cleared by cardiology for surgery . I will be happy to see him in clinic as well.  They are aware the hgb is low and similar to previous.  Per patient wife no, the patient is not taking any NSAID's. He is taking Tylenol prn.    They are aware we have made a referral to Hematology and if patient worsens to the point of having shortness of breath, fatigue, chest pain, dizziness, to report to the nearest ED and let the office know if they need anything in the mean time. The patient wife Cole Brown is aware and states understanding of all.   They are aware the referral has been submitted and will await call from Hematology for appointment date and time.

## 2023-05-01 ENCOUNTER — Inpatient Hospital Stay: Payer: Medicare HMO

## 2023-05-01 ENCOUNTER — Inpatient Hospital Stay: Payer: Medicare HMO | Attending: Hematology | Admitting: Hematology

## 2023-05-01 ENCOUNTER — Encounter: Payer: Self-pay | Admitting: Hematology

## 2023-05-01 VITALS — BP 128/78 | HR 61 | Temp 96.3°F | Resp 16 | Ht 71.0 in | Wt 175.0 lb

## 2023-05-01 DIAGNOSIS — K648 Other hemorrhoids: Secondary | ICD-10-CM | POA: Diagnosis not present

## 2023-05-01 DIAGNOSIS — D500 Iron deficiency anemia secondary to blood loss (chronic): Secondary | ICD-10-CM | POA: Diagnosis not present

## 2023-05-01 DIAGNOSIS — K922 Gastrointestinal hemorrhage, unspecified: Secondary | ICD-10-CM | POA: Diagnosis not present

## 2023-05-01 DIAGNOSIS — R760 Raised antibody titer: Secondary | ICD-10-CM | POA: Diagnosis not present

## 2023-05-01 DIAGNOSIS — Z79631 Long term (current) use of antimetabolite agent: Secondary | ICD-10-CM | POA: Diagnosis not present

## 2023-05-01 DIAGNOSIS — Z800 Family history of malignant neoplasm of digestive organs: Secondary | ICD-10-CM | POA: Insufficient documentation

## 2023-05-01 DIAGNOSIS — D7589 Other specified diseases of blood and blood-forming organs: Secondary | ICD-10-CM | POA: Insufficient documentation

## 2023-05-01 DIAGNOSIS — Z79899 Other long term (current) drug therapy: Secondary | ICD-10-CM | POA: Diagnosis not present

## 2023-05-01 DIAGNOSIS — D649 Anemia, unspecified: Secondary | ICD-10-CM

## 2023-05-01 LAB — FOLATE: Folate: >40 ng/mL (ref >5.9)

## 2023-05-01 LAB — FERRITIN: Ferritin: 25 ng/mL (ref 24–336)

## 2023-05-01 LAB — CBC WITH DIFFERENTIAL/PLATELET
Abs Immature Granulocytes: 0.06 10*3/uL (ref 0.00–0.07)
Basophils Absolute: 0.1 10*3/uL (ref 0.0–0.1)
Basophils Relative: 1 %
Eosinophils Absolute: 0.1 10*3/uL (ref 0.0–0.5)
Eosinophils Relative: 2 %
HCT: 26.1 % — ABNORMAL LOW (ref 39.0–52.0)
Hemoglobin: 7.7 g/dL — ABNORMAL LOW (ref 13.0–17.0)
Immature Granulocytes: 1 %
Lymphocytes Relative: 23 %
Lymphs Abs: 1.6 10*3/uL (ref 0.7–4.0)
MCH: 23.6 pg — ABNORMAL LOW (ref 26.0–34.0)
MCHC: 29.5 g/dL — ABNORMAL LOW (ref 30.0–36.0)
MCV: 80.1 fL (ref 80.0–100.0)
Monocytes Absolute: 0.4 10*3/uL (ref 0.1–1.0)
Monocytes Relative: 6 %
Neutro Abs: 4.6 10*3/uL (ref 1.7–7.7)
Neutrophils Relative %: 67 %
Platelets: 368 10*3/uL (ref 150–400)
RBC: 3.26 MIL/uL — ABNORMAL LOW (ref 4.22–5.81)
RDW: 19.8 % — ABNORMAL HIGH (ref 11.5–15.5)
WBC: 6.9 10*3/uL (ref 4.0–10.5)
nRBC: 0 % (ref 0.0–0.2)

## 2023-05-01 LAB — IRON AND TIBC
Iron: 19 ug/dL — ABNORMAL LOW (ref 45–182)
Saturation Ratios: 5 % — ABNORMAL LOW (ref 17.9–39.5)
TIBC: 415 ug/dL (ref 250–450)
UIBC: 396 ug/dL

## 2023-05-01 LAB — DIRECT ANTIGLOBULIN TEST (NOT AT ARMC)
DAT, IgG: NEGATIVE
DAT, complement: NEGATIVE

## 2023-05-01 LAB — RETICULOCYTES
Immature Retic Fract: 20.4 % — ABNORMAL HIGH (ref 2.3–15.9)
RBC.: 3.19 MIL/uL — ABNORMAL LOW (ref 4.22–5.81)
Retic Count, Absolute: 57.4 10*3/uL (ref 19.0–186.0)
Retic Ct Pct: 1.8 % (ref 0.4–3.1)

## 2023-05-01 LAB — SAMPLE TO BLOOD BANK

## 2023-05-01 LAB — LACTATE DEHYDROGENASE: LDH: 117 U/L (ref 98–192)

## 2023-05-01 LAB — VITAMIN B12: Vitamin B-12: 287 pg/mL (ref 180–914)

## 2023-05-01 NOTE — Progress Notes (Signed)
[facility] [address], [place] [address]   Clinic Day:  05/01/2023  Referring physician: Franky Macho, MD  Patient Care Team: Assunta Found, MD as PCP - General (Family Medicine) Wyline Mood Dorothe Pea, MD as PCP - Cardiology (Cardiology) Janalyn Harder, MD (Inactive) as Consulting Physician (Dermatology) [name], NP as Nurse Practitioner (Cardiology)   ASSESSMENT & PLAN:   Assessment:  1.  Normocytic anemia from blood loss: - Severe anemia since July 2024 requiring blood transfusions.  Last admission on 02/12/2023 for GI hemorrhage with acute blood loss anemia.  EGD on 02/15/2023 with multiple ulcers seen in the stomach and duodenum, s/p cauterization and clipping.  He has received 5 units PRBC. - Sigmoidoscopy on 02/15/2023 due to inadequate preparation. 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  Stricture was not able to be traversed with changing 3 scopes, pediatric, ultraslim and spaghetti upper scope.  Malignancy not excluded.  Nonbleeding external and internal hemorrhoids. - Still has intermittent bleeding per rectum per wife.  2.  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  Lupus anticoagulant negative.  Antibeta 2 glycoprotein 1 IgM antibody more than 150 on many occasions.  Anticardiolipin IgM improved to normal.  He was told to continue aspirin 325 mg daily for primary prophylaxis.  3.  Social/family history: -Lives at home with his wife.  Independent of ADLs and IADLs.  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  Non-smoker. - Mother had pancreatic cancer.  Father had colon cancer.  No family history of antiphospholipid syndrome or DVTs.  Plan:  1.  Normocytic anemia from blood loss: - I have reviewed previous blood work with the patient and his wife. - Recommend  repeating CBC with the blood bank sample.  Will check for nutritional deficiencies, hemolysis, bone marrow infiltrative process. - Will also check an EPO level and NGS myeloid panel. - We talked about the need for blood transfusion if hemoglobin is less than 7.  He is reportedly having a stress test next week in preparation for surgery? Colon. - We also discussed about parenteral iron therapy in the form of INFeD 1 g x 1 if his ferritin is low. - Discussed rare chance of reactions including anaphylactic reactions. - RTC 2 weeks for follow-up.  Orders Placed This Encounter  Procedures   Vitamin B12    Standing Status:   Future    Number of Occurrences:   1    Standing Expiration Date:   04/30/2024   Folate    Standing Status:   Future    Number of Occurrences:   1    Standing Expiration Date:   04/30/2024   Methylmalonic acid, serum    Standing Status:   Future    Number of Occurrences:   1    Standing Expiration Date:   04/30/2024   Lactate dehydrogenase    Standing Status:   Future    Number of Occurrences:   1    Standing Expiration Date:   04/30/2024   Reticulocytes    Standing Status:   Future    Number of Occurrences:   1    Standing Expiration Date:   04/30/2024   Kappa/lambda light chains    Standing Status:   Future    Number of Occurrences:   1    Standing Expiration Date:  04/30/2024   Immunofixation electrophoresis    Standing Status:   Future    Number of Occurrences:   1    Standing Expiration Date:   04/30/2024   Protein electrophoresis, serum    Standing Status:   Future    Number of Occurrences:   1    Standing Expiration Date:   04/30/2024   Haptoglobin    Standing Status:   Future    Number of Occurrences:   1    Standing Expiration Date:   04/30/2024   CBC with Differential    Standing Status:   Future    Number of Occurrences:   1    Standing Expiration Date:   04/30/2024   Iron and TIBC ([facility] ONLY)    Standing Status:    Future    Number of Occurrences:   1    Standing Expiration Date:   04/30/2024   Ferritin    Standing Status:   Future    Number of Occurrences:   1    Standing Expiration Date:   04/30/2024   Erythropoietin    Standing Status:   Future    Number of Occurrences:   1    Standing Expiration Date:   04/30/2024   Copper, serum    Standing Status:   Future    Number of Occurrences:   1    Standing Expiration Date:   04/30/2024   IntelliGEN Myeloid    Standing Status:   Future    Number of Occurrences:   1    Standing Expiration Date:   04/30/2024   Direct antiglobulin test    Standing Status:   Future    Number of Occurrences:   1    Standing Expiration Date:   04/30/2024   Sample to Blood Bank(Blood Bank Hold)    Standing Status:   Future    Number of Occurrences:   1    Standing Expiration Date:   04/30/2024      Alben Deeds Teague,acting as a scribe for Doreatha Massed, MD.,have documented all relevant documentation on the behalf of Doreatha Massed, MD,as directed by  Doreatha Massed, MD while in the presence of Doreatha Massed, MD.   I, Doreatha Massed MD, have reviewed the above documentation for accuracy and completeness, and I agree with the above.   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 male presenting to clinic today for evaluation of anemia at the request of Franky Macho, MD.  Patient was admitted to the hospital on 02/12/23 for GI hemorrhage with acute blood loss anemia and underwent an EGD on 8/30. Multiple ulcers were seen in the stomach and duodenum and hemorrhage was cauterized and clipped. He was given 5 units of PRBC. He was also thought to have a UTI and given Protonix infusion, Rocephin transitioned to ampicillin. He was discharged on 9/2. He was seen again  in the ED for anemia on 03/10/23 after being seen at urgent care the day prior for bilateral leg edema. He underwent a sigmoidoscopy on 03/14/23 with Dr. Tasia Catchings, and was found to still be anemic. He was seen at urgent care on 04/23/23 for hot flashes and was recommended to follow-up with neurology.   Today, he states that he is doing well overall. His appetite level is at 100%.  His energy level is at 50%.  He was found to have abnormal CBC from 04/25/23 with low RBC at 3.19, low HGB at 7.3, low HCT at 25.2, MCV normal at 79, low MCH at 22.9, low MCHC at 29.0, and elevated RDW at 17.9.   PAST MEDICAL HISTORY:   Past Medical History: Past Medical History:  Diagnosis Date   Anxiety    Depression    Diverticula of colon    2016 colonoscopy   Fibromyalgia    Fibromyalgia    History of GI diverticular bleed    Hypercholesteremia    Hypertension    Multiple gastric ulcers     Surgical History: Past Surgical History:  Procedure Laterality Date   allergy shots  weekly   BIOPSY  02/15/2023   Procedure: BIOPSY;  Surgeon: Dolores Frame, MD;  [facility]: [facility];  Service: Gastroenterology;;   CHOLECYSTECTOMY     COLONOSCOPY     Dr.Rehman   COLONOSCOPY N/A 01/27/2015   Rehman: multiple diverticula at sigmoid colon,ext hemorrhoids   COLONOSCOPY WITH PROPOFOL N/A 02/15/2023   Procedure: COLONOSCOPY WITH PROPOFOL;  Surgeon: Dolores Frame, MD;  [facility]: [facility];  Service: Gastroenterology;  Laterality: N/A;   ESOPHAGOGASTRODUODENOSCOPY (EGD) WITH PROPOFOL N/A 02/15/2023   Procedure: ESOPHAGOGASTRODUODENOSCOPY (EGD) WITH PROPOFOL;  Surgeon: Dolores Frame, MD;  [facility]: [facility];  Service: Gastroenterology;  Laterality: N/A;   EYE SURGERY Bilateral    partial cornea transplants    FLEXIBLE SIGMOIDOSCOPY  03/14/2023   Procedure: FLEXIBLE SIGMOIDOSCOPY;  Surgeon: Franky Macho, MD;  Location: [facility];  Service: Endoscopy;;   GALLBLADDER  SURGERY  12/1993   HOT HEMOSTASIS  02/15/2023   Procedure: HOT HEMOSTASIS (ARGON PLASMA COAGULATION/BICAP);  Surgeon: [name], [name], MD;  Location: [facility];  Service: Gastroenterology;;   Susa Day  02/15/2023   Procedure: Susa Day;  Surgeon: [name], [name], MD;  Location: [facility];  Service: Gastroenterology;;   UPPER GASTROINTESTINAL ENDOSCOPY      Social History: Social History   Socioeconomic History   Marital status: Married    Spouse name: Not on file   Number of children: Not on file   Years of education: Not on file   Highest education level: Not on file  Occupational History   Not on file  Tobacco Use   Smoking status: Never    Passive exposure: Never   Smokeless tobacco: Never  Vaping Use   Vaping status: Never Used  Substance and Sexual Activity   Alcohol use: Not Currently   Drug use: No   Sexual activity: Not on file  Other Topics Concern   Not on file  Social History Narrative   Not on file   Social Determinants of Health   Financial Resource Strain: Not on file  Food Insecurity: No Food Insecurity (02/12/2023)   Hunger Vital Sign    Worried About Running Out of Food in the Last Year: Never true    Ran Out of Food in the Last Year: Never true  Transportation Needs: No Transportation Needs (02/12/2023)   PRAPARE - Transportation    Lack of Transportation (Medical): No    Lack of Transportation (Non-Medical): No  Physical Activity: Not on file  Stress: Not on file  Social Connections: Not on file  Intimate Partner Violence: Not At Risk (02/12/2023)   Humiliation, Afraid, Rape, and Kick questionnaire    Fear of Current or Ex-Partner: No    Emotionally Abused: No  Physically Abused: No    Sexually Abused: No    Family History: Family History  Problem Relation Age of Onset   Heart disease Mother    Pancreatic cancer Mother    Prostate cancer Father    Healthy Sister    Parkinson's disease Brother     Asthma Sister    Healthy Sister     Current Medications:  Current Outpatient Medications:    acetaminophen (TYLENOL) 500 MG tablet, Take 1,000 mg by mouth every 12 (twelve) hours., Disp: , Rfl:    amLODipine (NORVASC) 5 MG tablet, Take 1 tablet (5 mg total) by mouth daily., Disp: 90 tablet, Rfl: 3   atenolol (TENORMIN) 25 MG tablet, TAKE (1/2) TABLET BY MOUTH ONCE DAILY. (Patient taking differently: Take 12.5 mg by mouth daily.), Disp: 45 tablet, Rfl: 2   azelastine (ASTELIN) 0.1 % nasal spray, Place 1 spray into both nostrils 2 (two) times daily., Disp: , Rfl:    Cholecalciferol (VITAMIN D) 50 MCG (2000 UT) CAPS, Take 2,000 Units by mouth daily., Disp: , Rfl:    diphenhydrAMINE (BENADRYL) 25 MG tablet, Take 25 mg by mouth daily as needed for allergies., Disp: , Rfl:    EPINEPHrine 0.3 mg/0.3 mL IJ SOAJ injection, Inject 0.3 mg into the muscle as needed for anaphylaxis., Disp: , Rfl:    famotidine (PEPCID) 20 MG tablet, Take 1 tablet (20 mg total) by mouth at bedtime. Takes as needed, Disp: 30 tablet, Rfl: 1   FLUoxetine (PROZAC) 10 MG tablet, Take 10 mg by mouth daily., Disp: , Rfl:    fluticasone (FLONASE) 50 MCG/ACT nasal spray, Place 1-2 sprays into both nostrils daily., Disp: , Rfl:    folic acid (FOLVITE) 1 MG tablet, TAKE 2 TABLETS BY MOUTH DAILY. (Patient taking differently: Take 2 mg by mouth daily.), Disp: 180 tablet, Rfl: 3   furosemide (LASIX) 20 MG tablet, Take 1 tablet (20 mg total) by mouth every other day., Disp: 45 tablet, Rfl: 3   Investigational - Study Medication, Atorvastatin study med. PREVENTABLE trial, Disp: , Rfl:    levocetirizine (XYZAL) 5 MG tablet, Take 5 mg by mouth every evening. , Disp: , Rfl:    methotrexate (RHEUMATREX) 2.5 MG tablet, Take 4 tablets (10 mg total) by mouth once a week., Disp: 16 tablet, Rfl: 2   montelukast (SINGULAIR) 10 MG tablet, Take 10 mg by mouth at bedtime., Disp: , Rfl:    Multiple Vitamins-Minerals (ICAPS AREDS 2 PO), Take 1 capsule by  mouth daily., Disp: , Rfl:    Omega-3 Fatty Acids (FISH OIL) 1000 MG CAPS, Take 1 capsule by mouth daily., Disp: , Rfl:    OVER THE COUNTER MEDICATION, Prep h ointment and cream as needed, Disp: , Rfl:    pantoprazole (PROTONIX) 40 MG tablet, Take 1 tablet (40 mg total) by mouth 2 (two) times daily., Disp: 60 tablet, Rfl: 2   Testosterone (ANDROGEL TD), Place onto the skin daily., Disp: , Rfl:    triazolam (HALCION) 0.25 MG tablet, Take 0.25 mg by mouth at bedtime. , Disp: , Rfl:    Allergies: Allergies  Allergen Reactions   Bee Pollen     Seasonal allergies   Plaquenil [Hydroxychloroquine Sulfate]     rash   Lodine [Etodolac] Swelling and Rash    REVIEW OF SYSTEMS:   Review of Systems  Constitutional:  Positive for fatigue. Negative for chills and fever.  HENT:   Negative for lump/mass, mouth sores, nosebleeds, sore throat and trouble swallowing.  Eyes:  Negative for eye problems.  Respiratory:  Negative for cough and shortness of breath.   Cardiovascular:  Negative for chest pain, leg swelling and palpitations.  Gastrointestinal:  Positive for blood in stool. Negative for abdominal pain, constipation, diarrhea, nausea and vomiting.  Genitourinary:  Negative for bladder incontinence, difficulty urinating, dysuria, frequency, hematuria and nocturia.   Musculoskeletal:  Negative for arthralgias, back pain, flank pain, myalgias and neck pain.  Skin:  Negative for itching and rash.  Neurological:  Negative for dizziness, headaches and numbness.  Hematological:  Does not bruise/bleed easily.  Psychiatric/Behavioral:  Negative for depression, sleep disturbance and suicidal ideas. The patient is not nervous/anxious.   All other systems reviewed and are negative.    VITALS:   Blood pressure 128/78, pulse 61, temperature (!) 96.3 F (35.7 C), temperature source Tympanic, resp. rate 16, height 5\' 11"  (1.803 m), weight 175 lb (79.4 kg), SpO2 97%.  Wt Readings from Last 3 Encounters:   05/01/23 175 lb (79.4 kg)  04/25/23 178 lb 6.4 oz (80.9 kg)  04/23/23 176 lb 6.4 oz (80 kg)    Body mass index is 24.41 kg/m.   PHYSICAL EXAM:   Physical Exam Vitals and nursing note reviewed. Exam conducted with a chaperone present.  Constitutional:      Appearance: Normal appearance.  Cardiovascular:     Rate and Rhythm: Normal rate and regular rhythm.     Pulses: Normal pulses.     Heart sounds: Normal heart sounds.  Pulmonary:     Effort: Pulmonary effort is normal.     Breath sounds: Normal breath sounds.  Abdominal:     Palpations: Abdomen is soft. There is no hepatomegaly, splenomegaly or mass.     Tenderness: There is no abdominal tenderness.  Musculoskeletal:     Right lower leg: No edema.     Left lower leg: No edema.  Lymphadenopathy:     Cervical: No cervical adenopathy.     Right cervical: No superficial, deep or posterior cervical adenopathy.    Left cervical: No superficial, deep or posterior cervical adenopathy.     Upper Body:     Right upper body: No supraclavicular or axillary adenopathy.     Left upper body: No supraclavicular or axillary adenopathy.  Neurological:     General: No focal deficit present.     Mental Status: He is alert and oriented to person, place, and time.  Psychiatric:        Mood and Affect: Mood normal.        Behavior: Behavior normal.     LABS:      Latest Ref Rng & Units 04/25/2023    4:23 PM 03/12/2023    1:20 PM 03/10/2023   11:12 AM  CBC  WBC 3.4 - 10.8 x10E3/uL 7.7  7.8  8.8   Hemoglobin 13.0 - 17.7 g/dL 7.3  7.0  7.2   Hematocrit 37.5 - 51.0 % 25.2  22.9  23.2   Platelets 150 - 450 x10E3/uL 343  290  270       Latest Ref Rng & Units 04/25/2023    4:23 PM 04/12/2023   10:42 AM 03/12/2023    1:20 PM  CMP  Glucose 70 - 99 mg/dL 782  956  213   BUN 8 - 27 mg/dL 18  16  23    Creatinine 0.76 - 1.27 mg/dL 0.86  5.78  4.69   Sodium 134 - 144 mmol/L 134  135  131   Potassium  3.5 - 5.2 mmol/L 4.8  4.2  3.4    Chloride 96 - 106 mmol/L 100  100  101   CO2 20 - 29 mmol/L 20  22  21    Calcium 8.6 - 10.2 mg/dL 8.5  8.7  7.8   Total Protein 6.0 - 8.5 g/dL 6.3     Total Bilirubin 0.0 - 1.2 mg/dL 0.3     Alkaline Phos 44 - 121 IU/L 158     AST 0 - 40 IU/L 43     ALT 0 - 44 IU/L 39        No results found for: "CEA1", "CEA" / No results found for: "CEA1", "CEA" No results found for: "PSA1" No results found for: "ONG295" No results found for: "CAN125"  Lab Results  Component Value Date   ALBUMINELP 3.9 08/21/2018   A1GS 0.4 (H) 08/21/2018   A2GS 0.8 08/21/2018   BETS 0.5 08/21/2018   BETA2SER 0.5 08/21/2018   GAMS 1.8 (H) 08/21/2018   SPEI  08/21/2018     Comment:     . One or more serum protein fractions are outside the normal ranges.  No abnormal protein bands are apparent. .    Lab Results  Component Value Date   TIBC 212 (L) 02/12/2023   TIBC 265 01/03/2023   FERRITIN 93 02/26/2023   FERRITIN 109 02/12/2023   FERRITIN 67 01/03/2023   IRONPCTSAT 8 (L) 02/12/2023   IRONPCTSAT 6 (L) 01/03/2023   No results found for: "LDH"   STUDIES:   ECHOCARDIOGRAM COMPLETE  Result Date: 04/10/2023    ECHOCARDIOGRAM REPORT   Patient Name:   Cole Brown Date of Exam: 04/10/2023 Medical Rec #:  [id number]       Height:       72.0 in Accession #:    [id number]      Weight:       174.0 lb Date of Birth:  March 28, 1946        BSA:          2.008 m Patient Age:    77 years        BP:           114/72 mmHg Patient Gender: M               HR:           59 bpm. Exam Location:  [facility] Procedure: 2D Echo, 3D Echo, Cardiac Doppler, Color Doppler and Strain Analysis Indications:    R06.02 SOB  History:        Patient has prior history of Echocardiogram examinations, most                 recent 01/04/2017. Signs/Symptoms:Dyspnea; Risk                 Factors:Dyslipidemia, Hypertension and Non-Smoker.  Sonographer:    Dominica Severin RCS, RVS Referring Phys: [id number] Dorothe Pea BRANCH IMPRESSIONS  1. Left  ventricular ejection fraction, by estimation, is 60 to 65%. Left ventricular ejection fraction by 3D volume is 70 %. The left ventricle has normal function. The left ventricle has no regional wall motion abnormalities. Left ventricular diastolic  parameters were normal.  2. Right ventricular systolic function is normal. The right ventricular size is normal. There is normal pulmonary artery systolic pressure.  3. The mitral valve is normal in structure. No evidence of mitral valve regurgitation. No evidence of mitral stenosis.  4. The aortic valve is tricuspid. Aortic valve regurgitation  is not visualized. No aortic stenosis is present.  5. Aortic dilatation noted. There is borderline dilatation of the aortic root, measuring 38 mm. There is borderline dilatation of the ascending aorta, measuring 39 mm.  6. The inferior vena cava is normal in size with greater than 50% respiratory variability, suggesting right atrial pressure of 3 mmHg. Comparison(s): No significant change from prior study. FINDINGS  Left Ventricle: Left ventricular ejection fraction, by estimation, is 60 to 65%. Left ventricular ejection fraction by 3D volume is 70 %. The left ventricle has normal function. The left ventricle has no regional wall motion abnormalities. The left ventricular internal cavity size was normal in size. There is no left ventricular hypertrophy. Left ventricular diastolic parameters were normal. Right Ventricle: The right ventricular size is normal. No increase in right ventricular wall thickness. Right ventricular systolic function is normal. There is normal pulmonary artery systolic pressure. The tricuspid regurgitant velocity is 2.08 m/s, and  with an assumed right atrial pressure of 3 mmHg, the estimated right ventricular systolic pressure is 20.3 mmHg. Left Atrium: Left atrial size was normal in size. Right Atrium: Right atrial size was normal in size. Pericardium: There is no evidence of pericardial effusion. Mitral  Valve: The mitral valve is normal in structure. No evidence of mitral valve regurgitation. No evidence of mitral valve stenosis. MV peak gradient, 5.4 mmHg. The mean mitral valve gradient is 2.0 mmHg. Tricuspid Valve: The tricuspid valve is normal in structure. Tricuspid valve regurgitation is not demonstrated. No evidence of tricuspid stenosis. Aortic Valve: The aortic valve is tricuspid. Aortic valve regurgitation is not visualized. No aortic stenosis is present. Aortic valve mean gradient measures 5.0 mmHg. Aortic valve peak gradient measures 8.3 mmHg. Aortic valve area, by VTI measures 2.56 cm. Pulmonic Valve: The pulmonic valve was normal in structure. Pulmonic valve regurgitation is trivial. No evidence of pulmonic stenosis. Aorta: Aortic dilatation noted. There is borderline dilatation of the aortic root, measuring 38 mm. There is borderline dilatation of the ascending aorta, measuring 39 mm. Venous: The inferior vena cava is normal in size with greater than 50% respiratory variability, suggesting right atrial pressure of 3 mmHg. IAS/Shunts: No atrial level shunt detected by color flow Doppler.  LEFT VENTRICLE PLAX 2D LVIDd:         4.90 cm         Diastology LVIDs:         2.40 cm         LV e' medial:    6.85 cm/s LV PW:         1.00 cm         LV E/e' medial:  13.1 LV IVS:        1.00 cm         LV e' lateral:   8.49 cm/s LVOT diam:     2.10 cm         LV E/e' lateral: 10.5 LV SV:         86 LV SV Index:   43 LVOT Area:     3.46 cm        3D Volume EF                                LV 3D EF:    Left  ventricul LV Volumes (MOD)                            ar LV vol d, MOD    111.0 ml                   ejection A2C:                                        fraction LV vol d, MOD    82.0 ml                    by 3D A4C:                                        volume is LV vol s, MOD    43.2 ml                    70 %. A2C: LV vol s, MOD    35.7 ml A4C:                            3D Volume EF: LV SV MOD A2C:   67.8 ml       3D EF:        70 % LV SV MOD A4C:   82.0 ml       LV EDV:       143 ml LV SV MOD BP:    56.6 ml       LV ESV:       42 ml                                LV SV:        101 ml RIGHT VENTRICLE RV Basal diam:  2.50 cm RV Mid diam:    2.50 cm RV S prime:     14.60 cm/s TAPSE (M-mode): 3.0 cm LEFT ATRIUM             Index        RIGHT ATRIUM           Index LA diam:        3.60 cm 1.79 cm/m   RA Area:     14.50 cm LA Vol (A2C):   83.7 ml 41.68 ml/m  RA Volume:   32.30 ml  16.08 ml/m LA Vol (A4C):   46.2 ml 23.01 ml/m LA Biplane Vol: 63.9 ml 31.82 ml/m  AORTIC VALVE                     PULMONIC VALVE AV Area (Vmax):    2.81 cm      PV Vmax:          1.13 m/s AV Area (Vmean):   2.35 cm      PV Peak grad:     5.1 mmHg AV Area (VTI):     2.56 cm      PR End Diast Vel: 3.66 msec AV Vmax:           144.00 cm/s AV Vmean:          105.000  cm/s AV VTI:            0.337 m AV Peak Grad:      8.3 mmHg AV Mean Grad:      5.0 mmHg LVOT Vmax:         117.00 cm/s LVOT Vmean:        71.200 cm/s LVOT VTI:          0.249 m LVOT/AV VTI ratio: 0.74  AORTA Ao Root diam: 3.80 cm Ao Asc diam:  3.90 cm MITRAL VALVE               TRICUSPID VALVE MV Area (PHT): 2.74 cm    TR Peak grad:   17.3 mmHg MV Area VTI:   2.14 cm    TR Vmax:        208.00 cm/s MV Peak grad:  5.4 mmHg MV Mean grad:  2.0 mmHg    SHUNTS MV Vmax:       1.16 m/s    Systemic VTI:  0.25 m MV Vmean:      65.0 cm/s   Systemic Diam: 2.10 cm MV Decel Time: 277 msec MV E velocity: 89.40 cm/s MV A velocity: 79.00 cm/s MV E/A ratio:  1.13 Vishnu Priya Mallipeddi Electronically signed by Winfield Rast Mallipeddi Signature Date/Time: 04/10/2023/12:39:09 PM    Final

## 2023-05-01 NOTE — Patient Instructions (Addendum)
You were seen and examined today by Dr. Ellin Saba. Dr. Ellin Saba is a hematologist, meaning that he specializes in blood abnormalities. Dr. Ellin Saba discussed your past medical history, family history of cancers/blood conditions and the events that led to you being here today.  You were referred to Dr. Ellin Saba due to anemia (low hemoglobin).  Dr. Ellin Saba has recommended additional labs today for further evaluation.   We will see you in 3-4 weeks to review the results of today's blood work.  Follow-up as scheduled.

## 2023-05-02 ENCOUNTER — Encounter: Payer: Self-pay | Admitting: Hematology

## 2023-05-02 DIAGNOSIS — D500 Iron deficiency anemia secondary to blood loss (chronic): Secondary | ICD-10-CM | POA: Insufficient documentation

## 2023-05-02 LAB — ERYTHROPOIETIN: Erythropoietin: 53.6 m[IU]/mL — ABNORMAL HIGH (ref 2.6–18.5)

## 2023-05-02 LAB — HAPTOGLOBIN: Haptoglobin: 285 mg/dL (ref 34–355)

## 2023-05-02 LAB — KAPPA/LAMBDA LIGHT CHAINS
Kappa free light chain: 105.4 mg/L — ABNORMAL HIGH (ref 3.3–19.4)
Kappa, lambda light chain ratio: 1.47 (ref 0.26–1.65)
Lambda free light chains: 71.7 mg/L — ABNORMAL HIGH (ref 5.7–26.3)

## 2023-05-02 NOTE — Addendum Note (Signed)
Addended by: Doreatha Massed on: 05/02/2023 07:58 AM   Modules accepted: Orders

## 2023-05-03 DIAGNOSIS — H524 Presbyopia: Secondary | ICD-10-CM | POA: Diagnosis not present

## 2023-05-03 DIAGNOSIS — H5203 Hypermetropia, bilateral: Secondary | ICD-10-CM | POA: Diagnosis not present

## 2023-05-03 DIAGNOSIS — H52223 Regular astigmatism, bilateral: Secondary | ICD-10-CM | POA: Diagnosis not present

## 2023-05-03 LAB — COPPER, SERUM: Copper: 135 ug/dL — ABNORMAL HIGH (ref 69–132)

## 2023-05-04 LAB — METHYLMALONIC ACID, SERUM: Methylmalonic Acid, Quantitative: 812 nmol/L — ABNORMAL HIGH (ref 0–378)

## 2023-05-06 LAB — IMMUNOFIXATION ELECTROPHORESIS
IgA: 428 mg/dL (ref 61–437)
IgG (Immunoglobin G), Serum: 1577 mg/dL (ref 603–1613)
IgM (Immunoglobulin M), Srm: 274 mg/dL — ABNORMAL HIGH (ref 15–143)
Total Protein ELP: 7 g/dL (ref 6.0–8.5)

## 2023-05-07 ENCOUNTER — Ambulatory Visit (HOSPITAL_BASED_OUTPATIENT_CLINIC_OR_DEPARTMENT_OTHER)
Admission: RE | Admit: 2023-05-07 | Discharge: 2023-05-07 | Disposition: A | Discharge status: Home / Self Care | Payer: Medicare HMO | Source: Ambulatory Visit | Attending: Student | Admitting: Student

## 2023-05-07 ENCOUNTER — Ambulatory Visit (HOSPITAL_COMMUNITY)
Admission: RE | Admit: 2023-05-07 | Discharge: 2023-05-07 | Disposition: A | Discharge status: Home / Self Care | Payer: Medicare HMO | Source: Ambulatory Visit | Attending: Student | Admitting: Student

## 2023-05-07 ENCOUNTER — Encounter (HOSPITAL_COMMUNITY): Payer: Self-pay

## 2023-05-07 DIAGNOSIS — Z01810 Encounter for preprocedural cardiovascular examination: Secondary | ICD-10-CM

## 2023-05-07 LAB — NM MYOCAR MULTI W/SPECT W/WALL MOTION / EF
Base ST Depression (mm): 0 mm
LV dias vol: 124 mL (ref 62–150)
LV sys vol: 74 mL
Nuc Stress EF: 40 %
Peak HR: 76 {beats}/min
RATE: 0.4
Rest HR: 55 {beats}/min
Rest Nuclear Isotope Dose: 11 mCi
SDS: 7
SRS: 2
SSS: 9
ST Depression (mm): 0 mm
Stress Nuclear Isotope Dose: 33 mCi
TID: 1.08

## 2023-05-07 MED ORDER — TECHNETIUM TC 99M TETROFOSMIN IV KIT
10.0000 | PACK | Freq: Once | INTRAVENOUS | Status: AC | PRN
  Administered 2023-05-07: 11 via INTRAVENOUS

## 2023-05-07 MED ORDER — REGADENOSON 0.4 MG/5ML IV SOLN
INTRAVENOUS | Status: AC
  Administered 2023-05-07: 0.4 mg via INTRAVENOUS
  Filled 2023-05-07: qty 5

## 2023-05-07 MED ORDER — TECHNETIUM TC 99M TETROFOSMIN IV KIT
30.0000 | PACK | Freq: Once | INTRAVENOUS | Status: AC | PRN
Start: 2023-05-07 — End: 2023-05-07
  Administered 2023-05-07: 33 via INTRAVENOUS

## 2023-05-07 MED ORDER — SODIUM CHLORIDE FLUSH 0.9 % IV SOLN
INTRAVENOUS | Status: AC
  Administered 2023-05-07: 10 mL via INTRAVENOUS
  Filled 2023-05-07: qty 10

## 2023-05-08 ENCOUNTER — Other Ambulatory Visit: Payer: Self-pay

## 2023-05-08 ENCOUNTER — Ambulatory Visit (HOSPITAL_COMMUNITY): Payer: Medicare HMO | Attending: Rheumatology

## 2023-05-08 DIAGNOSIS — M545 Low back pain, unspecified: Secondary | ICD-10-CM | POA: Insufficient documentation

## 2023-05-08 DIAGNOSIS — M797 Fibromyalgia: Secondary | ICD-10-CM | POA: Insufficient documentation

## 2023-05-08 DIAGNOSIS — G8929 Other chronic pain: Secondary | ICD-10-CM | POA: Insufficient documentation

## 2023-05-08 LAB — PROTEIN ELECTROPHORESIS, SERUM
A/G Ratio: 0.8 (ref 0.7–1.7)
Albumin ELP: 3 g/dL (ref 2.9–4.4)
Alpha-1-Globulin: 0.3 g/dL (ref 0.0–0.4)
Alpha-2-Globulin: 0.9 g/dL (ref 0.4–1.0)
Beta Globulin: 1 g/dL (ref 0.7–1.3)
Gamma Globulin: 1.8 g/dL (ref 0.4–1.8)
Globulin, Total: 4 g/dL — ABNORMAL HIGH (ref 2.2–3.9)
Total Protein ELP: 7 g/dL (ref 6.0–8.5)

## 2023-05-08 NOTE — Therapy (Signed)
OUTPATIENT PHYSICAL THERAPY THORACOLUMBAR EVALUATION   Patient Name: Cole Brown SCORE MRN: [id number] DOB:1946/02/18, 77 y.o., male Today's Date: 05/08/2023  END OF SESSION:  PT End of Session - 05/08/23 1310     Visit Number 1    Number of Visits 8    Date for PT Re-Evaluation 06/05/23    Authorization Type [employer]    PT Start Time 1305    PT Stop Time 1345    PT Time Calculation (min) 40 min    Activity Tolerance Patient tolerated treatment well    Behavior During Therapy [facility] for tasks assessed/performed             Past Medical History:  Diagnosis Date   Anxiety    Depression    Diverticula of colon    2016 colonoscopy   Fibromyalgia    Fibromyalgia    History of GI diverticular bleed    Hypercholesteremia    Hypertension    Multiple gastric ulcers    Past Surgical History:  Procedure Laterality Date   allergy shots  weekly   BIOPSY  02/15/2023   Procedure: BIOPSY;  Surgeon: Dolores Frame, MD;  [facility];  Service: Gastroenterology;;   CHOLECYSTECTOMY     COLONOSCOPY     Dr.Rehman   COLONOSCOPY N/A 01/27/2015   Rehman: multiple diverticula at sigmoid colon,ext hemorrhoids   COLONOSCOPY WITH PROPOFOL N/A 02/15/2023   Procedure: COLONOSCOPY WITH PROPOFOL;  Surgeon: Dolores Frame, MD;  [facility];  Service: Gastroenterology;  Laterality: N/A;   ESOPHAGOGASTRODUODENOSCOPY (EGD) WITH PROPOFOL N/A 02/15/2023   Procedure: ESOPHAGOGASTRODUODENOSCOPY (EGD) WITH PROPOFOL;  Surgeon: Dolores Frame, MD;  [facility];  Service: Gastroenterology;  Laterality: N/A;   EYE SURGERY Bilateral    partial cornea transplants    FLEXIBLE SIGMOIDOSCOPY  03/14/2023   Procedure: FLEXIBLE SIGMOIDOSCOPY;  Surgeon: Franky Macho, MD;  [facility];  Service: Endoscopy;;   GALLBLADDER SURGERY  12/1993   HOT HEMOSTASIS  02/15/2023   Procedure: HOT HEMOSTASIS (ARGON PLASMA COAGULATION/BICAP);   Surgeon: [name], [name], MD;  [facility];  Service: Gastroenterology;;   Susa Day  02/15/2023   Procedure: Susa Day;  Surgeon: [name], [name], MD;  [facility];  Service: Gastroenterology;;   UPPER GASTROINTESTINAL ENDOSCOPY     Patient Active Problem List   Diagnosis Date Noted   Iron deficiency anemia due to chronic blood loss 05/02/2023   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/14/2023   Duodenal ulcer 02/16/2023   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   Gastrointestinal hemorrhage 02/15/2023   Protein-calorie malnutrition, severe 02/14/2023   Acute blood loss anemia 02/12/2023   Perforation of sigmoid colon (HCC) 01/08/2023   Acute post-hemorrhagic anemia 01/08/2023   Diverticulitis 01/03/2023   Hyponatremia 01/03/2023   Hematochezia 12/25/2022   Diverticulosis of colon without hemorrhage 12/25/2022   Diarrhea 03/30/2021   Hemorrhoids 03/30/2021   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   Lower abdominal pain 07/06/2019   Autoimmune disease (HCC) 09/15/2018   [name] disease without gangrene 09/15/2018   Hypercholesteremia 08/21/2018   History of diverticulitis 08/21/2018   History of iron deficiency anemia 08/21/2018   Fibromyalgia 08/21/2018   History of multiple allergies 08/21/2018   History of sleep apnea 08/21/2018   Rectal bleed 11/14/2016   Essential hypertension 11/14/2016   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    PCP: Assunta Found, MD  REFERRING PROVIDER:  Pollyann Savoy, MD [facility] [place]    REFERRING DIAG: M79.7 (ICD-10-CM) -  Fibromyalgia M54.50,G89.29 (ICD-10-CM) - Chronic bilateral low back pain without sciatica  Rationale for Evaluation and Treatment: Rehabilitation  THERAPY DIAG:  Fibromyalgia - Plan: PT plan of care cert/re-cert  Low back pain, unspecified back pain laterality, unspecified chronicity, unspecified whether sciatica present - Plan: PT plan of care  cert/re-cert  ONSET DATE:  diagnosed with fibromyalgia in the mid 90's  SUBJECTIVE:                                                                                                                                                                                           SUBJECTIVE STATEMENT: Some low back pain but mostly he feels his issue is fibromyalgia.  Was involved with a research study at [facility] looking into mild exercise to improve his management of fibromyalgia.  Has recently had some significant anemia; noted decreased appetite; fatigue and such; multiple trips to the ED; His right side tends to be a little more sensitive and painful Accompanied by his wife Annice Pih  PERTINENT HISTORY:  Anemia; having iron infusion on 11/25 at [facility]; has ulcers in his esophagus being treated with Protonix daily.   Having surgery soon per Franky Macho will be having a fistula   PAIN:  Are you having pain? Yes: NPRS scale:   fibromyalgia pain0-6 or 7/10  right abdomen 3/10 Pain location: Right side of abdomen; muscles pain due to fibromyalgia Pain description: achey,  Aggravating factors: damp weather, changes in weather Relieving factors: Tylenol rest  PRECAUTIONS: None    WEIGHT BEARING RESTRICTIONS: No  FALLS:  Has patient fallen in last 6 months? Yes. Number of falls August 8th  LIVING ENVIRONMENT: Lives with: lives with their spouse Lives in: House/apartment Stairs: Yes: External: 3 steps; none Has following equipment at home: Grab bars and CPAP  OCCUPATION: retired  PLOF: Independent  PATIENT GOALS: not to ache so much  NEXT MD VISIT: March 5th 2025  OBJECTIVE:  Note: Objective measures were completed at Evaluation unless otherwise noted.  DIAGNOSTIC FINDINGS:  None current  PATIENT SURVEYS:  Modified Oswestry 13/50 25%     COGNITION: Overall cognitive status: Within functional limits for tasks assessed     SENSATION: Not tested  POSTURE: rounded shoulders and  forward head bilateral knee valgus  PALPATION: Not tested  LUMBAR ROM:   AROM eval  Flexion   Extension   Right lateral flexion   Left lateral flexion   Right rotation   Left rotation    (Blank rows = not tested)  LOWER EXTREMITY ROM:     Active  Right eval Left eval  Hip flexion    Hip extension  Hip abduction    Hip adduction    Hip internal rotation    Hip external rotation    Knee flexion    Knee extension    Ankle dorsiflexion    Ankle plantarflexion    Ankle inversion    Ankle eversion     (Blank rows = not tested)  LOWER EXTREMITY MMT:    MMT Right eval Left eval  Hip flexion 4+ 4+  Hip extension 4- 4  Hip abduction 3+ 4  Hip adduction    Hip internal rotation    Hip external rotation    Knee flexion 4+ 5  Knee extension 5 5  Ankle dorsiflexion 5 5  Ankle plantarflexion    Ankle inversion    Ankle eversion     (Blank rows = not tested)  FUNCTIONAL TESTS:  5 times sit to stand: 16.50 sec no UE assist 2 minute walk test: not tested SLS R 0" and left 18" Able to attain quadruped position  GAIT: Distance walked: 80 ft in clinic Assistive device utilized: None Level of assistance: Modified independence Comments: slightly decreased gait speed; no LOB or path deviation noted  TODAY'S TREATMENT:                                                                                                                              DATE: 05/08/2023 physical therapy and HEP instruction    PATIENT EDUCATION:  Education details: Patient educated on exam findings, POC, scope of PT, HEP, and patient upcoming surgery versus scheduling PT visits. Person educated: Patient Education method: Explanation, Demonstration, and Handouts Education comprehension: verbalized understanding, returned demonstration, verbal cues required, and tactile cues required  HOME EXERCISE PROGRAM: Access Code: [email or web address] URL: [email or web address] Date:  05/08/2023 Prepared by: [facility]  Exercises - Sidelying Hip Abduction  - 2 x daily - 7 x weekly - 2 sets - 10 reps - Prone Hip Extension  - 2 x daily - 7 x weekly - 2 sets - 10 reps - Standing Hip Extension with Counter Support  - 1 x daily - 7 x weekly - 2 sets - 10 reps - Standing Hip Abduction with Counter Support  - 1 x daily - 7 x weekly - 2 sets - 10 reps - standing single leg balance at the counter (try to not hold on)  - 1 x daily - 7 x weekly - 1 sets - 10 reps - Sit to Stand  - 1 x daily - 7 x weekly - 3 sets - 10 reps  ASSESSMENT:  CLINICAL IMPRESSION: Patient is a 77 y.o. male who was seen today for physical therapy evaluation and treatment for M79.7 (ICD-10-CM) - Fibromyalgia M54.50,G89.29 (ICD-10-CM) - Chronic bilateral low back pain without sciatica. Patient denies any specific low back pain currently. Patient demonstrates muscle weakness, impaired balance, and fascial restrictions which are likely contributing to symptoms of pain and are negatively impacting patient ability to perform ADLs and  functional mobility tasks. Patient will benefit from skilled physical therapy services to address these deficits to reduce pain and improve level of function with ADLs and functional mobility tasks.   OBJECTIVE IMPAIRMENTS: decreased activity tolerance, decreased balance, decreased knowledge of condition, decreased strength, increased fascial restrictions, impaired perceived functional ability, and pain.   ACTIVITY LIMITATIONS: sitting, standing, and locomotion level  PARTICIPATION LIMITATIONS: shopping, community activity, and yard work  PERSONAL FACTORS:  other medical issues  are also affecting patient's functional outcome. Anemia; upcoming surgery  REHAB POTENTIAL: Good  CLINICAL DECISION MAKING: Evolving/moderate complexity  EVALUATION COMPLEXITY: Moderate   GOALS: Goals reviewed with patient? No  SHORT TERM GOALS: Target date: 05/15/2023  patient will be independent  with initial HEP  Baseline: Goal status: INITIAL   LONG TERM GOALS: Target date: 06/05/2023  Patient will be independent in self management strategies to improve quality of life and functional outcomes.  Baseline:  Goal status: INITIAL  2.  Patient will self report 50% improvement to improve tolerance for functional activity  Baseline:  Goal status: INITIAL  3.  Patient will improve Modified Oswestry score by 3 points (10/50) to demonstrate improved perceived functional mobility  Baseline: 13/50 Goal status: INITIAL  4.  Patient will increase  leg MMTs to 4+ to 5/5 without pain to promote return to ambulation community distances with minimal deviation.  Baseline: see above Goal status: INITIAL  5.  Patient will be able to stand SLS x 10" right leg to demonstrate improved functional balance Baseline: 0" Goal status: INITIAL PLAN:  PT FREQUENCY: 2x/week  PT DURATION: 4 weeks  PLANNED INTERVENTIONS: 97164- PT Re-evaluation, 97110-Therapeutic exercises, 97530- Therapeutic activity, 97112- Neuromuscular re-education, 97535- Self Care, 16109- Manual therapy, [phone]- Gait training, [phone]- Orthotic Fit/training, [phone]- Canalith repositioning, [id number]- Aquatic Therapy, [phone]- Splinting, Patient/Family education, Balance training, Stair training, Taping, Dry Needling, Joint mobilization, Joint manipulation, Spinal manipulation, Spinal mobilization, Scar mobilization, and DME instructions. Marland Kitchen  PLAN FOR NEXT SESSION: Review HEP and goals; progress balance; strengthening as able; patient likely facing upcoming abdominal surgery so will hold off scheduling further appointments until he knows the date of this surgery.     2:04 PM, 05/08/23 Deone Omahoney Small Jelitza Manninen MPT [facility] [phone] Ph:[phone]  [employer] Auth Request  Referring diagnosis code (ICD 10)? M79.7; M54.50 Treatment diagnosis codes (ICD 10)? (if different than referring diagnosis) M79.7; M 54.50 What was  this (referring dx) caused by? []  Surgery []  Fall [x]  Ongoing issue []  Arthritis []  Other: ____________  Laterality: []  Rt []  Lt [x]  Both  Deficits: [x]  Pain [x]  Stiffness [x]  Weakness [x]  Edema [x]  Balance Deficits []  Coordination [x]  Gait Disturbance []  ROM []  Other   Functional Tool Score: Modified Oswestry 13/50  CPT codes: See Planned Interventions listed in the Plan section of the Evaluation.

## 2023-05-10 ENCOUNTER — Encounter (INDEPENDENT_AMBULATORY_CARE_PROVIDER_SITE_OTHER): Payer: Self-pay | Admitting: *Deleted

## 2023-05-10 ENCOUNTER — Ambulatory Visit
Admission: EM | Admit: 2023-05-10 | Discharge: 2023-05-10 | Disposition: A | Discharge status: Home / Self Care | Payer: Medicare HMO | Attending: Nurse Practitioner | Admitting: Nurse Practitioner

## 2023-05-10 DIAGNOSIS — N390 Urinary tract infection, site not specified: Secondary | ICD-10-CM | POA: Diagnosis not present

## 2023-05-10 LAB — POCT URINALYSIS DIP (MANUAL ENTRY)
Bilirubin, UA: NEGATIVE
Glucose, UA: NEGATIVE mg/dL
Ketones, POC UA: NEGATIVE mg/dL
Nitrite, UA: POSITIVE — AB
Protein Ur, POC: 100 mg/dL — AB
Spec Grav, UA: 1.01 (ref 1.010–1.025)
Urobilinogen, UA: 0.2 U/dL
pH, UA: 6 (ref 5.0–8.0)

## 2023-05-10 MED ORDER — CEPHALEXIN 500 MG PO CAPS: 500.0000 mg | ORAL_CAPSULE | Freq: Four times a day (QID) | ORAL | 0 refills | Status: AC

## 2023-05-10 NOTE — ED Triage Notes (Signed)
Pt reports he has burning with urination, "itch on the scrotum", some low back abdominal pain and confusion, since earlier today.

## 2023-05-10 NOTE — ED Provider Notes (Signed)
[facility]    CSN: [id number] Arrival date & time: 05/10/23  1241      History   Chief Complaint No chief complaint on file.   HPI Cole Brown is a 77 y.o. male.   Patient presents today accompanied by wife for 1 day history of burning with urination testicles and penis with urinating.  Urinary frequency, abdominal pain, nausea/vomiting, or diarrhea.  No fevers or penile discharge.  Patient's wife reports he has a colovesical fistula that they are worried is pushing on the bladder and is following closely with gastroenterology.  He also gets iron infusions for anemia.  Patient denies history of UTIs.  Wife reports he was treated with ampicillin a couple of months ago for the fistula when he was discharged from the hospital for bleeding in his throat and stomach.    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   Gastrointestinal hemorrhage 02/15/2023   Protein-calorie malnutrition, severe 02/14/2023   Acute blood loss anemia 02/12/2023   Perforation of sigmoid colon (HCC) 01/08/2023   Acute post-hemorrhagic anemia 01/08/2023   Diverticulitis 01/03/2023   Hyponatremia 01/03/2023   Hematochezia 12/25/2022   Diverticulosis of colon without hemorrhage 12/25/2022   Diarrhea 03/30/2021   Hemorrhoids 03/30/2021   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   Lower abdominal pain 07/06/2019   Autoimmune disease (HCC) 09/15/2018   [name] disease without gangrene 09/15/2018   Hypercholesteremia 08/21/2018   History of diverticulitis 08/21/2018    History of iron deficiency anemia 08/21/2018   Fibromyalgia 08/21/2018   History of multiple allergies 08/21/2018   History of sleep apnea 08/21/2018   Rectal bleed 11/14/2016   Essential hypertension 11/14/2016   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    Past Surgical History:  Procedure Laterality Date   allergy shots  weekly   BIOPSY  02/15/2023   Procedure: BIOPSY;  Surgeon: Dolores Frame, MD;  [facility];  Service: Gastroenterology;;   CHOLECYSTECTOMY     COLONOSCOPY     Dr.Rehman   COLONOSCOPY N/A 01/27/2015   Rehman: multiple diverticula at sigmoid colon,ext hemorrhoids   COLONOSCOPY WITH PROPOFOL N/A 02/15/2023   Procedure: COLONOSCOPY WITH PROPOFOL;  Surgeon: Dolores Frame, MD;  [facility];  Service: Gastroenterology;  Laterality: N/A;   ESOPHAGOGASTRODUODENOSCOPY (EGD) WITH PROPOFOL N/A 02/15/2023   Procedure: ESOPHAGOGASTRODUODENOSCOPY (EGD) WITH PROPOFOL;  Surgeon: Dolores Frame, MD;  [facility];  Service: Gastroenterology;  Laterality: N/A;   EYE SURGERY Bilateral    partial cornea transplants    FLEXIBLE SIGMOIDOSCOPY  03/14/2023   Procedure: FLEXIBLE SIGMOIDOSCOPY;  Surgeon: Franky Macho, MD;  [facility];  Service: Endoscopy;;   GALLBLADDER SURGERY  12/1993   HOT HEMOSTASIS  02/15/2023   Procedure: HOT HEMOSTASIS (ARGON PLASMA COAGULATION/BICAP);  Surgeon: [name], [name], MD;  [facility];  Service: Gastroenterology;;   Susa Day  02/15/2023   Procedure: Susa Day;  Surgeon: [name], [name], MD;  [facility];  Service: Gastroenterology;;  UPPER GASTROINTESTINAL ENDOSCOPY         Home Medications    Prior to Admission medications   Medication Sig Start Date End Date Taking? Authorizing Provider  cephALEXin (KEFLEX) 500 MG capsule Take 1 capsule (500 mg total) by mouth 4 (four) times daily for 10 days. 05/10/23 05/20/23 Yes  Valentino Nose, NP  acetaminophen (TYLENOL) 500 MG tablet Take 1,000 mg by mouth every 12 (twelve) hours.    [provider]  amLODipine (NORVASC) 5 MG tablet Take 1 tablet (5 mg total) by mouth daily. 08/28/22   [name], NP  atenolol (TENORMIN) 25 MG tablet TAKE (1/2) TABLET BY MOUTH ONCE DAILY. Patient taking differently: Take 12.5 mg by mouth daily. 12/03/22   [name], MD  azelastine (ASTELIN) 0.1 % nasal spray Place 1 spray into both nostrils 2 (two) times daily. 09/17/19   [provider]  Cholecalciferol (VITAMIN D) 50 MCG (2000 UT) CAPS Take 2,000 Units by mouth daily.    [provider]  diphenhydrAMINE (BENADRYL) 25 MG tablet Take 25 mg by mouth daily as needed for allergies.    [provider]  EPINEPHrine 0.3 mg/0.3 mL IJ SOAJ injection Inject 0.3 mg into the muscle as needed for anaphylaxis. 10/14/19   [provider]  famotidine (PEPCID) 20 MG tablet Take 1 tablet (20 mg total) by mouth at bedtime. Takes as needed 02/18/23   [name], MD  FLUoxetine (PROZAC) 10 MG tablet Take 10 mg by mouth daily.    [provider]  fluticasone (FLONASE) 50 MCG/ACT nasal spray Place 1-2 sprays into both nostrils daily. 07/27/20   [provider]  folic acid (FOLVITE) 1 MG tablet TAKE 2 TABLETS BY MOUTH DAILY. Patient taking differently: Take 2 mg by mouth daily. 10/12/22   Gearldine Bienenstock, PA-C  furosemide (LASIX) 20 MG tablet Take 1 tablet (20 mg total) by mouth every other day. 04/26/23   Ellsworth Lennox, PA-C  Investigational - Study Medication Atorvastatin study med. PREVENTABLE trial    [provider]  levocetirizine (XYZAL) 5 MG tablet Take 5 mg by mouth every evening.     [provider]  methotrexate (RHEUMATREX) 2.5 MG tablet Take 4 tablets (10 mg total) by mouth once a week. 04/23/23   Pollyann Savoy, MD  montelukast (SINGULAIR) 10 MG tablet Take 10 mg by mouth at bedtime.    [provider]  Multiple Vitamins-Minerals (ICAPS AREDS 2 PO) Take 1 capsule by mouth daily.    [provider]  Omega-3 Fatty Acids (FISH OIL) 1000 MG CAPS Take 1 capsule by mouth daily.    [provider]  OVER THE COUNTER MEDICATION Prep h ointment and cream as needed    [provider]  pantoprazole (PROTONIX) 40 MG tablet Take 1 tablet (40 mg total) by mouth 2 (two) times daily. 02/18/23 02/18/24  [name], MD  Testosterone (ANDROGEL TD) Place onto the skin daily.    [provider]  triazolam (HALCION) 0.25 MG tablet Take 0.25 mg by mouth at bedtime.     [provider]    Family History Family History  Problem Relation Age of Onset   Heart disease Mother    Pancreatic cancer Mother    Prostate cancer Father    Healthy Sister    Parkinson's disease Brother    Asthma Sister    Healthy Sister     Social History Social History   Tobacco Use   Smoking status:  Never    Passive exposure: Never   Smokeless tobacco: Never  Vaping Use   Vaping status: Never Used  Substance Use Topics   Alcohol use: Not Currently   Drug use: No     Allergies   Bee pollen, Plaquenil [hydroxychloroquine sulfate], and Lodine [etodolac]   Review of Systems Review of Systems Per HPI  Physical Exam Triage Vital Signs ED Triage Vitals  Encounter Vitals Group     BP 05/10/23 1249 125/70     Systolic BP Percentile --      Diastolic BP Percentile --      Pulse Rate 05/10/23 1249 (!) 59     Resp 05/10/23 1249 16     Temp 05/10/23 1249 97.9 F (36.6 C)     Temp Source 05/10/23 1249 Oral     SpO2 05/10/23 1249 99 %     Weight --      Height --      Head Circumference --      Peak Flow --      Pain Score 05/10/23 1251 0     Pain Loc --      Pain Education --      Exclude from Growth Chart --    No data found.  Updated Vital Signs BP 125/70 (BP Location: Right Arm)   Pulse (!) 59   Temp 97.9 F (36.6 C) (Oral)   Resp 16   SpO2 99%    Visual Acuity Right Eye Distance:   Left Eye Distance:   Bilateral Distance:    Right Eye Near:   Left Eye Near:    Bilateral Near:     Physical Exam Vitals and nursing note reviewed.  Constitutional:      General: He is not in acute distress.    Appearance: Normal appearance. He is not toxic-appearing.  HENT:     Mouth/Throat:     Mouth: Mucous membranes are moist.     Pharynx: Oropharynx is clear.  Cardiovascular:     Rate and Rhythm: Normal rate and regular rhythm.  Pulmonary:     Effort: Pulmonary effort is normal. No respiratory distress.     Breath sounds: Normal breath sounds. No wheezing, rhonchi or rales.  Abdominal:     General: Abdomen is flat. Bowel sounds are normal. There is no distension.     Palpations: Abdomen is soft.     Tenderness: There is no abdominal tenderness. There is no right CVA tenderness, left CVA tenderness or guarding.  Skin:    General: Skin is warm and dry.     Coloration: Skin is not jaundiced or pale.     Findings: No erythema.  Neurological:     Mental Status: He is alert and oriented to person, place, and time.  Psychiatric:        Behavior: Behavior is cooperative.      UC Treatments / Results  Labs (all labs ordered are listed, but only abnormal results are displayed) Labs Reviewed  POCT URINALYSIS DIP (MANUAL ENTRY) - Abnormal; Notable for the following components:      Result Value   Clarity, UA cloudy (*)    Blood, UA moderate (*)    Protein Ur, POC =100 (*)    Nitrite, UA Positive (*)    Leukocytes, UA Large (3+) (*)    All other components within normal limits  URINE CULTURE    EKG   Radiology No results found.  Procedures Procedures (including critical care time)  Medications  Ordered in UC Medications - No data to display  Initial Impression / Assessment and Plan / UC Course  I have reviewed the triage vital signs and the nursing notes.  Pertinent labs & imaging results that were available during my  care of the patient were reviewed by me and considered in my medical decision making (see chart for details).   Patient is well-appearing, normotensive, afebrile, not tachycardic, not tachypneic, oxygenating well on room air.    1. Complicated UTI (urinary tract infection) Urinalysis today is cloudy with positive nitrites, large leukocyte Estrace Last treated with ampicillin approximately 2 months ago for likely colonic fistula Based on most recent creatinine, creatinine clearance greater than 30, will treat with Keflex 500 mg 4 times daily for 10 days Strict ER precautions discussed with wife  The patient was given the opportunity to ask questions.  All questions answered to their satisfaction.  The patient is in agreement to this plan.    Final Clinical Impressions(s) / UC Diagnoses   Final diagnoses:  Complicated UTI (urinary tract infection)     Discharge Instructions      The urine sample today shows a urinary tract infection.  Take the Keflex 500 mg 4 times daily for 10 days as prescribed to treat it.  We will contact you early next week if the urine culture shows that we need to change the antibiotic.  Increase hydration with plenty of water.  Seek care in emergency room if symptoms worsen and fever, nausea/vomiting and inability to keep fluids down, or severe abdominal pain develop.    ED Prescriptions     Medication Sig Dispense Auth. Provider   cephALEXin (KEFLEX) 500 MG capsule Take 1 capsule (500 mg total) by mouth 4 (four) times daily for 10 days. 40 capsule Valentino Nose, NP      PDMP not reviewed this encounter.   Valentino Nose, NP 05/10/23 [phone]

## 2023-05-10 NOTE — Discharge Instructions (Signed)
The urine sample today shows a urinary tract infection.  Take the Keflex 500 mg 4 times daily for 10 days as prescribed to treat it.  We will contact you early next week if the urine culture shows that we need to change the antibiotic.  Increase hydration with plenty of water.  Seek care in emergency room if symptoms worsen and fever, nausea/vomiting and inability to keep fluids down, or severe abdominal pain develop.

## 2023-05-13 ENCOUNTER — Inpatient Hospital Stay: Payer: Medicare HMO

## 2023-05-13 VITALS — BP 121/65 | HR 74 | Temp 98.8°F | Resp 18

## 2023-05-13 DIAGNOSIS — K648 Other hemorrhoids: Secondary | ICD-10-CM | POA: Diagnosis not present

## 2023-05-13 DIAGNOSIS — D7589 Other specified diseases of blood and blood-forming organs: Secondary | ICD-10-CM | POA: Diagnosis not present

## 2023-05-13 DIAGNOSIS — K922 Gastrointestinal hemorrhage, unspecified: Secondary | ICD-10-CM | POA: Diagnosis not present

## 2023-05-13 DIAGNOSIS — Z79631 Long term (current) use of antimetabolite agent: Secondary | ICD-10-CM | POA: Diagnosis not present

## 2023-05-13 DIAGNOSIS — Z800 Family history of malignant neoplasm of digestive organs: Secondary | ICD-10-CM | POA: Diagnosis not present

## 2023-05-13 DIAGNOSIS — D500 Iron deficiency anemia secondary to blood loss (chronic): Secondary | ICD-10-CM | POA: Diagnosis not present

## 2023-05-13 DIAGNOSIS — R760 Raised antibody titer: Secondary | ICD-10-CM | POA: Diagnosis not present

## 2023-05-13 DIAGNOSIS — Z79899 Other long term (current) drug therapy: Secondary | ICD-10-CM | POA: Diagnosis not present

## 2023-05-13 LAB — URINE CULTURE: Culture: >=100000 — AB

## 2023-05-13 MED ORDER — IRON DEXTRAN 50 MG/ML IJ SOLN
950.0000 mg | Freq: Once | INTRAMUSCULAR | Status: AC
  Administered 2023-05-13: 950 mg via INTRAVENOUS
  Filled 2023-05-13: qty 19

## 2023-05-13 MED ORDER — IRON DEXTRAN 50 MG/ML IJ SOLN
50.0000 mg | Freq: Once | INTRAMUSCULAR | Status: AC
  Administered 2023-05-13: 50 mg via INTRAVENOUS
  Filled 2023-05-13: qty 1

## 2023-05-13 MED ORDER — CETIRIZINE HCL 10 MG/ML IV SOLN
10.0000 mg | Freq: Once | INTRAVENOUS | Status: AC
  Administered 2023-05-13: 10 mg via INTRAVENOUS
  Filled 2023-05-13: qty 1

## 2023-05-13 MED ORDER — SODIUM CHLORIDE 0.9 % IV SOLN: INTRAVENOUS | Status: DC

## 2023-05-13 MED ORDER — ACETAMINOPHEN 325 MG PO TABS
650.0000 mg | ORAL_TABLET | Freq: Once | ORAL | Status: AC
  Administered 2023-05-13: 650 mg via ORAL
  Filled 2023-05-13: qty 2

## 2023-05-13 MED ORDER — FAMOTIDINE IN NACL 20-0.9 MG/50ML-% IV SOLN
20.0000 mg | Freq: Once | INTRAVENOUS | Status: AC
  Administered 2023-05-13: 20 mg via INTRAVENOUS
  Filled 2023-05-13: qty 50

## 2023-05-13 MED ORDER — METHYLPREDNISOLONE SODIUM SUCC 125 MG IJ SOLR
125.0000 mg | Freq: Once | INTRAMUSCULAR | Status: AC
  Administered 2023-05-13: 125 mg via INTRAVENOUS
  Filled 2023-05-13: qty 2

## 2023-05-13 NOTE — Progress Notes (Signed)
Patient tolerated iron infusion with no complaints voiced.  Peripheral IV site clean and dry with good blood return noted before and after infusion.  Band aid applied.  VSS with discharge and left in satisfactory condition with no s/s of distress noted.   

## 2023-05-13 NOTE — Patient Instructions (Signed)
[facility] - A DEPT OF [facility]  Discharge Instructions: Thank you for choosing [facility] to provide your oncology and hematology care.  If you have a lab appointment with the Cancer Center - please note that after April 8th, 2024, all labs will be drawn in the cancer center.  You do not have to check in or register with the main entrance as you have in the past but will complete your check-in in the cancer center.  Wear comfortable clothing and clothing appropriate for easy access to any Portacath or PICC line.   We strive to give you quality time with your provider. You may need to reschedule your appointment if you arrive late (15 or more minutes).  Arriving late affects you and other patients whose appointments are after yours.  Also, if you miss three or more appointments without notifying the office, you may be dismissed from the clinic at the provider's discretion.      For prescription refill requests, have your pharmacy contact our office and allow 72 hours for refills to be completed.    Today you received the following Infed, return as scheduled.   To help prevent nausea and vomiting after your treatment, we encourage you to take your nausea medication as directed.  BELOW ARE SYMPTOMS THAT SHOULD BE REPORTED IMMEDIATELY: *FEVER GREATER THAN 100.4 F (38 C) OR HIGHER *CHILLS OR SWEATING *NAUSEA AND VOMITING THAT IS NOT CONTROLLED WITH YOUR NAUSEA MEDICATION *UNUSUAL SHORTNESS OF BREATH *UNUSUAL BRUISING OR BLEEDING *URINARY PROBLEMS (pain or burning when urinating, or frequent urination) *BOWEL PROBLEMS (unusual diarrhea, constipation, pain near the anus) TENDERNESS IN MOUTH AND THROAT WITH OR WITHOUT PRESENCE OF ULCERS (sore throat, sores in mouth, or a toothache) UNUSUAL RASH, SWELLING OR PAIN  UNUSUAL VAGINAL DISCHARGE OR ITCHING   Items with * indicate a potential emergency and should be followed up as soon as possible or  go to the Emergency Department if any problems should occur.  Please show the CHEMOTHERAPY ALERT CARD or IMMUNOTHERAPY ALERT CARD at check-in to the Emergency Department and triage nurse.  Should you have questions after your visit or need to cancel or reschedule your appointment, please contact [facility] - A DEPT OF [facility] [phone]  and follow the prompts.  Office hours are 8:00 a.m. to 4:30 p.m. Monday - Friday. Please note that voicemails left after 4:00 p.m. may not be returned until the following business day.  We are closed weekends and major holidays. You have access to a nurse at all times for urgent questions. Please call the main number to the clinic [phone] and follow the prompts.  For any non-urgent questions, you may also contact your provider using MyChart. We now offer e-Visits for anyone 67 and older to request care online for non-urgent symptoms. For details visit [email or web address].   Also download the MyChart app! Go to the app store, search "MyChart", open the app, select [facility], and log in with your MyChart username and password.

## 2023-05-14 LAB — INTELLIGEN MYELOID

## 2023-05-21 ENCOUNTER — Encounter (HOSPITAL_COMMUNITY): Payer: Self-pay

## 2023-05-21 DIAGNOSIS — H353211 Exudative age-related macular degeneration, right eye, with active choroidal neovascularization: Secondary | ICD-10-CM | POA: Diagnosis not present

## 2023-05-21 DIAGNOSIS — H35033 Hypertensive retinopathy, bilateral: Secondary | ICD-10-CM | POA: Diagnosis not present

## 2023-05-21 DIAGNOSIS — Z961 Presence of intraocular lens: Secondary | ICD-10-CM | POA: Diagnosis not present

## 2023-05-21 DIAGNOSIS — H353122 Nonexudative age-related macular degeneration, left eye, intermediate dry stage: Secondary | ICD-10-CM | POA: Diagnosis not present

## 2023-05-21 DIAGNOSIS — H43813 Vitreous degeneration, bilateral: Secondary | ICD-10-CM | POA: Diagnosis not present

## 2023-05-21 NOTE — Therapy (Signed)
Spoke with patient on the phone; likely having surgery soon so wants to wait on physical therapy at this time.     9:03 AM, 05/21/23 Latanga Nedrow Small Laurelle Skiver MPT [place] [facility] [place] [phone]

## 2023-05-22 ENCOUNTER — Telehealth (INDEPENDENT_AMBULATORY_CARE_PROVIDER_SITE_OTHER): Payer: Self-pay | Admitting: Gastroenterology

## 2023-05-22 NOTE — Telephone Encounter (Signed)
Who is your primary care physician: Assunta Found  Reasons for the EGD: 3 month recall  Are you diabetic? If yes, Type 1 or Type 2?    no  Do you have a prosthetic or mechanical heart valve? no  Do you have a pacemaker/defibrillator?   no  Have you had endocarditis/atrial fibrillation? no  Have you had joint replacement within the last 12 months?  no  Do you have any history of drugs or alchohol?  no  Do you use supplemental oxygen?  no  Have you had a stroke or heart attack within the last 6 months? no  Do you take weight loss medication?  no  Do you take any blood-thinning medications such as: (aspirin, warfarin, Plavix, Aggrenox)  no  If yes we need the name, milligram, dosage and who is prescribing doctor  Current Outpatient Medications on File Prior to Visit  Medication Sig Dispense Refill   acetaminophen (TYLENOL) 500 MG tablet Take 1,000 mg by mouth every 12 (twelve) hours.     amLODipine (NORVASC) 5 MG tablet Take 1 tablet (5 mg total) by mouth daily. 90 tablet 3   atenolol (TENORMIN) 25 MG tablet TAKE (1/2) TABLET BY MOUTH ONCE DAILY. (Patient taking differently: Take 12.5 mg by mouth daily.) 45 tablet 2   azelastine (ASTELIN) 0.1 % nasal spray Place 1 spray into both nostrils 2 (two) times daily.     Cholecalciferol (VITAMIN D) 50 MCG (2000 UT) CAPS Take 2,000 Units by mouth daily.     diphenhydrAMINE (BENADRYL) 25 MG tablet Take 25 mg by mouth daily as needed for allergies.     EPINEPHrine 0.3 mg/0.3 mL IJ SOAJ injection Inject 0.3 mg into the muscle as needed for anaphylaxis.     famotidine (PEPCID) 20 MG tablet Take 1 tablet (20 mg total) by mouth at bedtime. Takes as needed 30 tablet 1   FLUoxetine (PROZAC) 10 MG tablet Take 10 mg by mouth daily.     fluticasone (FLONASE) 50 MCG/ACT nasal spray Place 1-2 sprays into both nostrils daily.     folic acid (FOLVITE) 1 MG tablet TAKE 2 TABLETS BY MOUTH DAILY. (Patient taking differently: Take 2 mg by mouth daily.) 180  tablet 3   furosemide (LASIX) 20 MG tablet Take 20 mg by mouth once. Once daily every other day     Investigational - Study Medication Atorvastatin study med. PREVENTABLE trial     levocetirizine (XYZAL) 5 MG tablet Take 5 mg by mouth every evening.      methotrexate (RHEUMATREX) 2.5 MG tablet Take 4 tablets (10 mg total) by mouth once a week. 16 tablet 2   montelukast (SINGULAIR) 10 MG tablet Take 10 mg by mouth at bedtime.     Multiple Vitamins-Minerals (ICAPS AREDS 2 PO) Take 1 capsule by mouth daily.     Omega-3 Fatty Acids (FISH OIL) 1000 MG CAPS Take 1 capsule by mouth daily.     OVER THE COUNTER MEDICATION Prep h ointment and cream as needed     pantoprazole (PROTONIX) 40 MG tablet Take 1 tablet (40 mg total) by mouth 2 (two) times daily. 60 tablet 2   Testosterone (ANDROGEL TD) Place onto the skin daily.     triazolam (HALCION) 0.25 MG tablet Take 0.25 mg by mouth at bedtime.      No current facility-administered medications on file prior to visit.    Allergies  Allergen Reactions   Bee Pollen     Seasonal allergies   Plaquenil [Hydroxychloroquine  Sulfate]     rash   Lodine [Etodolac] Swelling and Rash     Primary Insurance Name: Francine Graven  Best number where you can be reached: [phone]

## 2023-05-23 ENCOUNTER — Ambulatory Visit: Payer: Medicare HMO | Admitting: General Surgery

## 2023-05-23 ENCOUNTER — Encounter: Payer: Self-pay | Admitting: General Surgery

## 2023-05-23 VITALS — BP 123/74 | HR 59 | Temp 97.8°F | Resp 12 | Ht 71.0 in | Wt 171.0 lb

## 2023-05-23 DIAGNOSIS — N321 Vesicointestinal fistula: Secondary | ICD-10-CM

## 2023-05-24 ENCOUNTER — Encounter: Payer: Self-pay | Admitting: Emergency Medicine

## 2023-05-24 ENCOUNTER — Other Ambulatory Visit: Payer: Self-pay | Admitting: *Deleted

## 2023-05-24 ENCOUNTER — Ambulatory Visit
Admission: EM | Admit: 2023-05-24 | Discharge: 2023-05-24 | Disposition: A | Discharge status: Home / Self Care | Payer: Medicare HMO | Attending: Nurse Practitioner | Admitting: Nurse Practitioner

## 2023-05-24 ENCOUNTER — Other Ambulatory Visit: Payer: Self-pay

## 2023-05-24 ENCOUNTER — Telehealth: Payer: Self-pay | Admitting: *Deleted

## 2023-05-24 DIAGNOSIS — N390 Urinary tract infection, site not specified: Secondary | ICD-10-CM

## 2023-05-24 DIAGNOSIS — Z79899 Other long term (current) drug therapy: Secondary | ICD-10-CM

## 2023-05-24 DIAGNOSIS — M359 Systemic involvement of connective tissue, unspecified: Secondary | ICD-10-CM

## 2023-05-24 DIAGNOSIS — N321 Vesicointestinal fistula: Secondary | ICD-10-CM | POA: Diagnosis not present

## 2023-05-24 HISTORY — DX: Anemia, unspecified: D64.9

## 2023-05-24 LAB — POCT URINALYSIS DIP (MANUAL ENTRY)
Glucose, UA: NEGATIVE mg/dL
Nitrite, UA: POSITIVE — AB
Protein Ur, POC: 100 mg/dL — AB
Spec Grav, UA: 1.025 (ref 1.010–1.025)
Urobilinogen, UA: 0.2 U/dL
pH, UA: 5.5 (ref 5.0–8.0)

## 2023-05-24 MED ORDER — SULFAMETHOXAZOLE-TRIMETHOPRIM 800-160 MG PO TABS: 1.0000 | ORAL_TABLET | Freq: Two times a day (BID) | ORAL | 0 refills | Status: AC

## 2023-05-24 NOTE — Progress Notes (Signed)
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  He was recently seen in urgent care and started on an antibiotic for a UTI.  He states he is continuing to have normal bowel movements.  No nausea or vomiting have been noted.  Patient is a poor historian as per his wife.  This is due to mild dementia.  He has undergone iron infusion through oncology with Dr. Ellin Saba.  He has a follow-up appointment with them next week.  This is due to chronic anemia.  He denies passing air when urinating.  He has been cleared by cardiology for colectomy as needed. Objective:    BP 123/74   Pulse (!) 59   Temp 97.8 F (36.6 C) (Oral)   Resp 12   Ht 5\' 11"  (1.803 m)   Wt 171 lb (77.6 kg)   SpO2 98%   BMI 23.85 kg/m   General:  alert, cooperative, and no distress  Abdomen is soft, nontender, nondistended.       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  No progression to obstruction noted at the present time. Cardiac disease, cleared by cardiology Chronic anemia being addressed by Dr. Ellin Saba of oncology Plan:   Will get CT scan with rectal contrast on 05/28/2023 to assess the colonic stricture and colovesical fistula.  Will also check with oncology concerning his anemia.  No CBC has been done since his last infusion.  Will follow-up with him after these have been done.

## 2023-05-24 NOTE — ED Triage Notes (Signed)
Pt reports dysuria and hematuria since last night. Just finished keflex abx yesterday for UTI.

## 2023-05-24 NOTE — Telephone Encounter (Signed)
This patient is being worked up by surgery for possible surgery , Can we have him on reminder in next 2 months for assessment of peptic ulcer disease with repeat EGD

## 2023-05-24 NOTE — Discharge Instructions (Addendum)
The urinalysis does show a urinary tract infection.  A urine culture is pending.  You will be contacted once results of the culture are received.  You also have access to results via MyChart. Take medication as prescribed. Make sure you are drinking at least 5-7 8 ounce glasses of water daily. Make sure you are urinating at least every 2 hours while symptoms persist. Avoid caffeine such as tea, soda, or coffee while symptoms persist. Monitor symptoms for worsening to include worsening urinary symptoms, with new symptoms of fever, chills, pain around the kidneys, increased confusion, or increased fatigue.  If any of the symptoms develop or worsen, please go to the emergency department immediately for further evaluation. Try to strain every urine to determine if stones are actually being passed.  If so, please discuss this with urology when an appointment has been scheduled. A referral for urology has been placed.  As discussed, if you have not been contacted within the next 3 to 5 days, please call the office to schedule an appointment. Follow-up as needed.

## 2023-05-24 NOTE — Telephone Encounter (Signed)
Patient's wife called on his behalf. She states she was reviewing his after visit summary. She states patient has a serious UTI and she wants to know if he should hold his MTX. Patient is on Bactrim. Advised patient should hold MTX until infection has cleared and antibiotics have been completed. Patient does have an appointment scheduled with a urologist. Patient is also due for labs. Labs orders released.

## 2023-05-24 NOTE — Telephone Encounter (Signed)
Have placed questionnaire in Feb folder

## 2023-05-24 NOTE — ED Provider Notes (Signed)
[facility]    CSN: [id number] Arrival date & time: 05/24/23  0808      History   Chief Complaint Chief Complaint  Patient presents with   Dysuria    HPI Cole Brown is a 77 y.o. male.   The history is provided by the patient and the spouse.   Patient brought in by his wife for complaints of pain with urination and hematuria.  Patient's wife states patient has been more confused over the past 2 days along with increased fatigue.  She denies fever, chills, chest pain, abdominal pain, nausea, vomiting, or diarrhea.  Patient denies urinary frequency, urgency, decreased urine stream, low back pain, testicular/scrotal pain/swelling, or flank pain.  Patient's wife reports patient does have a history of a fistula.  States that they did see general surgery 1 day ago.  Patient was treated for a UTI with Keflex on 11/22, wife states that he finished the antibiotic 1 day ago as well.  Patient's wife denies prior history of UTIs or genitourinary conditions.  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   Duodenal ulcer 02/16/2023   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   Gastrointestinal hemorrhage 02/15/2023   Protein-calorie malnutrition, severe 02/14/2023   Acute blood loss anemia 02/12/2023   Perforation of sigmoid colon (HCC) 01/08/2023   Acute post-hemorrhagic anemia 01/08/2023   Diverticulitis 01/03/2023   Hyponatremia 01/03/2023   Hematochezia 12/25/2022   Diverticulosis of colon without hemorrhage 12/25/2022   Diarrhea 03/30/2021   Hemorrhoids 03/30/2021   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    Lower abdominal pain 07/06/2019   Autoimmune disease (HCC) 09/15/2018   [name] disease without gangrene 09/15/2018   Hypercholesteremia 08/21/2018   History of diverticulitis 08/21/2018   History of iron deficiency anemia 08/21/2018   Fibromyalgia 08/21/2018   History of multiple allergies 08/21/2018   History of sleep apnea 08/21/2018   Rectal bleed 11/14/2016   Essential hypertension 11/14/2016   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    Past Surgical History:  Procedure Laterality Date   allergy shots  weekly   BIOPSY  02/15/2023   Procedure: BIOPSY;  Surgeon: Dolores Frame, MD;  Location: [facility];  Service: Gastroenterology;;   CHOLECYSTECTOMY     COLONOSCOPY     Dr.Rehman   COLONOSCOPY N/A 01/27/2015   Rehman: multiple diverticula at sigmoid colon,ext hemorrhoids   COLONOSCOPY WITH PROPOFOL N/A 02/15/2023   Procedure: COLONOSCOPY WITH PROPOFOL;  Surgeon: Dolores Frame, MD;  Location: [facility];  Service: Gastroenterology;  Laterality: N/A;   ESOPHAGOGASTRODUODENOSCOPY (EGD) WITH PROPOFOL N/A 02/15/2023   Procedure: ESOPHAGOGASTRODUODENOSCOPY (EGD) WITH PROPOFOL;  Surgeon: Dolores Frame, MD;  Location: [facility];  Service: Gastroenterology;  Laterality: N/A;   EYE SURGERY Bilateral    partial cornea transplants    FLEXIBLE SIGMOIDOSCOPY  03/14/2023   Procedure: FLEXIBLE SIGMOIDOSCOPY;  Surgeon: Franky Macho, MD;  Location: [facility];  Service: Endoscopy;;   GALLBLADDER SURGERY  12/1993   HOT HEMOSTASIS  02/15/2023   Procedure: HOT HEMOSTASIS (ARGON PLASMA COAGULATION/BICAP);  Surgeon: [name], [name], MD;  Location: [facility];  Service: Gastroenterology;;   Susa Day  02/15/2023   Procedure: Susa Day;  Surgeon: [name], [name], MD;  Location: [facility];  Service: Gastroenterology;;   UPPER GASTROINTESTINAL ENDOSCOPY         Home Medications    Prior to Admission medications    Medication Sig Start Date End Date Taking? Authorizing Provider  sulfamethoxazole-trimethoprim (BACTRIM DS) 800-160 MG tablet Take 1 tablet by mouth 2 (two) times daily for 10 days. 05/24/23 06/03/23 Yes Leath-Warren, Sadie Haber, NP  acetaminophen (TYLENOL) 500 MG tablet Take 1,000 mg by mouth every 12 (twelve) hours.    [provider]  amLODipine (NORVASC) 5 MG tablet Take 1 tablet (5 mg total) by mouth daily. 08/28/22   [name], NP  atenolol (TENORMIN) 25 MG tablet TAKE (1/2) TABLET BY MOUTH ONCE DAILY. Patient taking differently: Take 12.5 mg by mouth daily. 12/03/22   [name], MD  azelastine (ASTELIN) 0.1 % nasal spray Place 1 spray into both nostrils 2 (two) times daily. 09/17/19   [provider]  cephALEXin (KEFLEX) 500 MG capsule Take 500 mg by mouth 4 (four) times daily.  05/26/23  [provider]  Cholecalciferol (VITAMIN D) 50 MCG (2000 UT) CAPS Take 2,000 Units by mouth daily.    [provider]  diphenhydrAMINE (BENADRYL) 25 MG tablet Take 25 mg by mouth daily as needed for allergies.    [provider]  EPINEPHrine 0.3 mg/0.3 mL IJ SOAJ injection Inject 0.3 mg into the muscle as needed for anaphylaxis. 10/14/19   [provider]  famotidine (PEPCID) 20 MG tablet Take 1 tablet (20 mg total) by mouth at bedtime. Takes as needed 02/18/23   [name], MD  FLUoxetine (PROZAC) 10 MG tablet Take 10 mg by mouth daily.    [provider]  fluticasone (FLONASE) 50 MCG/ACT nasal spray Place 1-2 sprays into both nostrils daily. 07/27/20   [provider]  folic acid (FOLVITE) 1 MG tablet TAKE 2 TABLETS BY MOUTH DAILY. Patient taking differently: Take 2 mg by mouth daily. 10/12/22   Gearldine Bienenstock, PA-C  furosemide (LASIX) 20 MG tablet Take 20 mg by mouth once. Once daily every other day    [provider]  Investigational - Study Medication Atorvastatin study med. PREVENTABLE trial    [provider]  levocetirizine (XYZAL) 5 MG tablet Take 5 mg by mouth every evening.     [provider]  methotrexate (RHEUMATREX) 2.5 MG tablet Take 4 tablets (10 mg total) by mouth once a week. 04/23/23   Pollyann Savoy, MD  montelukast (SINGULAIR) 10 MG tablet Take 10 mg by mouth at bedtime.    [provider]  Multiple Vitamins-Minerals (ICAPS AREDS 2 PO) Take 1 capsule by mouth daily.    [provider]  Omega-3 Fatty Acids (FISH OIL) 1000 MG CAPS Take 1 capsule by mouth daily.    [provider]  OVER THE COUNTER MEDICATION Prep h ointment and cream as needed    [provider]  pantoprazole (PROTONIX) 40 MG tablet Take 1 tablet (40 mg total) by mouth 2 (two) times daily. 02/18/23 02/18/24  [name], MD  Testosterone (ANDROGEL TD) Place onto the skin daily.    [provider]  triazolam (HALCION) 0.25 MG tablet Take 0.25 mg by mouth at bedtime.     [provider]    Family History Family History  Problem Relation Age  of Onset   Heart disease Mother    Pancreatic cancer Mother    Prostate cancer Father    Healthy Sister    Parkinson's disease Brother    Asthma Sister    Healthy Sister     Social History Social History   Tobacco Use   Smoking status: Never    Passive exposure: Never   Smokeless tobacco: Never  Vaping Use   Vaping status: Never Used  Substance Use Topics   Alcohol use: Not Currently   Drug use: No     Allergies   Bee pollen, Plaquenil [hydroxychloroquine sulfate], and Lodine [etodolac]   Review of Systems Review of Systems Per HPI  Physical Exam Triage Vital Signs ED Triage Vitals  Encounter Vitals Group     BP 05/24/23 0828 111/68     Systolic BP Percentile --      Diastolic BP Percentile --      Pulse Rate 05/24/23 0828 64     Resp 05/24/23 0828 17     Temp 05/24/23 0828 97.6 F (36.4 C)     Temp Source 05/24/23 0828 Oral     SpO2 05/24/23 0828 99 %     Weight --       Height --      Head Circumference --      Peak Flow --      Pain Score 05/24/23 0830 0     Pain Loc --      Pain Education --      Exclude from Growth Chart --    No data found.  Updated Vital Signs BP 111/68 (BP Location: Right Arm)   Pulse 64   Temp 97.6 F (36.4 C) (Oral)   Resp 17   SpO2 99%   Visual Acuity Right Eye Distance:   Left Eye Distance:   Bilateral Distance:    Right Eye Near:   Left Eye Near:    Bilateral Near:     Physical Exam Vitals and nursing note reviewed.  Constitutional:      General: He is not in acute distress.    Appearance: Normal appearance.  HENT:     Head: Normocephalic.  Eyes:     Extraocular Movements: Extraocular movements intact.     Pupils: Pupils are equal, round, and reactive to light.  Cardiovascular:     Rate and Rhythm: Normal rate and regular rhythm.     Pulses: Normal pulses.     Heart sounds: Normal heart sounds.  Pulmonary:     Effort: Pulmonary effort is normal. No respiratory distress.     Breath sounds: Normal breath sounds. No stridor. No wheezing, rhonchi or rales.  Abdominal:     General: Bowel sounds are normal.     Palpations: Abdomen is soft.     Tenderness: There is no abdominal tenderness.  Musculoskeletal:     Cervical back: Normal range of motion.  Lymphadenopathy:     Cervical: No cervical adenopathy.  Skin:    General: Skin is warm and dry.  Neurological:     General: No focal deficit present.     Mental Status: He is alert and oriented to person, place, and time.  Psychiatric:        Mood and Affect: Mood normal.        Behavior: Behavior normal.      UC Treatments / Results  Labs (all labs ordered are listed, but only abnormal results are displayed) Labs Reviewed  POCT URINALYSIS DIP (MANUAL  ENTRY) - Abnormal; Notable for the following components:      Result Value   Clarity, UA cloudy (*)    Bilirubin, UA small (*)    Ketones, POC UA trace (5) (*)    Blood, UA large (*)    Protein  Ur, POC =100 (*)    Nitrite, UA Positive (*)    Leukocytes, UA Large (3+) (*)    All other components within normal limits  URINE CULTURE   Urinary sample appears to have a possible "stone."  Urine culture has been ordered. EKG   Radiology No results found.  Procedures Procedures (including critical care time)  Medications Ordered in UC Medications - No data to display  Initial Impression / Assessment and Plan / UC Course  I have reviewed the triage vital signs and the nursing notes.  Pertinent labs & imaging results that were available during my care of the patient were reviewed by me and considered in my medical decision making (see chart for details).  Patient presents with continued hematuria and dysuria despite being on antibiotics over the past 10 days.  Patient also with underlying history of colovesical fistula.  Patient is not demonstrating any systemic symptoms such as fever, chills, flank pain, or abdominal pain at this time.  Will start patient on Bactrim DS 800/160 mg for complicated UTI (most recent creatinine 1.12, GFR greater than 59, no indication to reduce dosing for renal issues), will also place referral for patient to see urology.  Patient's wife was advised to monitor patient for worsening symptoms to include fever, chills, worsening urinary symptoms, increased confusion, or lethargy.  Supportive care recommendations were provided and discussed with the patient and spouse to include Tylenol for pain or discomfort, increasing his fluid intake, and toileting at least every 2 hours.  Patient's spouse was advised that if referral did not go through, recommend patient follow-up with his primary care physician for further evaluation and for referral.  Patient spouse was in agreement with this plan of care and verbalized understanding.  All questions were answered.  Patient stable for discharge.  Final Clinical Impressions(s) / UC Diagnoses   Final diagnoses:  Complicated  UTI (urinary tract infection)  Colovesical fistula     Discharge Instructions      The urinalysis does show a urinary tract infection.  A urine culture is pending.  You will be contacted once results of the culture are received.  You also have access to results via MyChart. Take medication as prescribed. Make sure you are drinking at least 5-7 8 ounce glasses of water daily. Make sure you are urinating at least every 2 hours while symptoms persist. Avoid caffeine such as tea, soda, or coffee while symptoms persist. Monitor symptoms for worsening to include worsening urinary symptoms, with new symptoms of fever, chills, pain around the kidneys, increased confusion, or increased fatigue.  If any of the symptoms develop or worsen, please go to the emergency department immediately for further evaluation. Try to strain every urine to determine if stones are actually being passed.  If so, please discuss this with urology when an appointment has been scheduled. A referral for urology has been placed.  As discussed, if you have not been contacted within the next 3 to 5 days, please call the office to schedule an appointment. Follow-up as needed.     ED Prescriptions     Medication Sig Dispense Auth. Provider   sulfamethoxazole-trimethoprim (BACTRIM DS) 800-160 MG tablet Take 1 tablet by mouth  2 (two) times daily for 10 days. 20 tablet Leath-Warren, Sadie Haber, NP      PDMP not reviewed this encounter.   Abran Cantor, NP 05/24/23 [phone]

## 2023-05-26 LAB — URINE CULTURE: Culture: >=100000 — AB

## 2023-05-27 DIAGNOSIS — Z79899 Other long term (current) drug therapy: Secondary | ICD-10-CM | POA: Diagnosis not present

## 2023-05-27 DIAGNOSIS — M359 Systemic involvement of connective tissue, unspecified: Secondary | ICD-10-CM | POA: Diagnosis not present

## 2023-05-29 ENCOUNTER — Inpatient Hospital Stay: Payer: Medicare HMO

## 2023-05-29 ENCOUNTER — Inpatient Hospital Stay: Payer: Medicare HMO | Attending: Hematology | Admitting: Hematology

## 2023-05-29 VITALS — BP 119/75 | HR 55 | Temp 97.9°F | Resp 16 | Wt 173.5 lb

## 2023-05-29 DIAGNOSIS — K922 Gastrointestinal hemorrhage, unspecified: Secondary | ICD-10-CM | POA: Insufficient documentation

## 2023-05-29 DIAGNOSIS — E538 Deficiency of other specified B group vitamins: Secondary | ICD-10-CM | POA: Insufficient documentation

## 2023-05-29 DIAGNOSIS — Z79631 Long term (current) use of antimetabolite agent: Secondary | ICD-10-CM | POA: Insufficient documentation

## 2023-05-29 DIAGNOSIS — D500 Iron deficiency anemia secondary to blood loss (chronic): Secondary | ICD-10-CM | POA: Insufficient documentation

## 2023-05-29 DIAGNOSIS — Z79899 Other long term (current) drug therapy: Secondary | ICD-10-CM | POA: Diagnosis not present

## 2023-05-29 MED ORDER — CYANOCOBALAMIN 1000 MCG/ML IJ SOLN
1000.0000 ug | Freq: Once | INTRAMUSCULAR | Status: AC
  Administered 2023-05-29: 1000 ug via INTRAMUSCULAR

## 2023-05-29 NOTE — Progress Notes (Signed)
[facility] [address], [place] [address]    Clinic Day:  05/29/2023  Referring physician: Assunta Found, MD  Patient Care Team: Assunta Found, MD as PCP - General (Family Medicine) Wyline Mood Dorothe Pea, MD as PCP - Cardiology (Cardiology) Janalyn Harder, MD (Inactive) as Consulting Physician (Dermatology) [name], NP as Nurse Practitioner (Cardiology)   ASSESSMENT & PLAN:   Assessment: 1.  Normocytic anemia from blood loss: - Severe anemia since July 2024 requiring blood transfusions.  Last admission on 02/12/2023 for GI hemorrhage with acute blood loss anemia.  EGD on 02/15/2023 with multiple ulcers seen in the stomach and duodenum, s/p cauterization and clipping.  He has received 5 units PRBC. - Sigmoidoscopy on 02/15/2023 due to inadequate preparation. 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  Stricture was not able to be traversed with changing 3 scopes, pediatric, ultraslim and spaghetti upper scope.  Malignancy not excluded.  Nonbleeding external and internal hemorrhoids. - Still has intermittent bleeding per rectum per wife. - NGS (05/01/2023): SRSF2 - Serum EPO: 53.6   2.  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  Lupus anticoagulant negative.  Antibeta 2 glycoprotein 1 IgM antibody more than 150 on many occasions.  Anticardiolipin IgM improved to normal.  He was told to continue aspirin 325 mg daily for primary prophylaxis.   3.  Social/family history: -Lives at home with his wife.  Independent of ADLs and IADLs.  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  Non-smoker. - Mother had pancreatic cancer.  Father had colon cancer.  No family history of antiphospholipid syndrome or DVTs.  Plan:  1.  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 - We reviewed labs from 05/01/2023: We discussed NGS results.  Hemolysis workup was negative.  Immunofixation and free light chains were negative. - Hemoglobin today improved to 8.8. - Recommend 1 more dose of INFeD 1 g x 1. 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 - Recommend follow-up in 6 weeks with repeat CBC, ferritin, iron panel and B12 level.  2.  B12 deficiency: - B12 was borderline and MMA was elevated. - Will give B12 1 mg injection today and start him on B12 1 mg tablet daily.   No orders of the defined types were placed in this encounter.    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    Prior Therapy: INFeD  Current Therapy: INFeD   HISTORY OF PRESENT ILLNESS:   Oncology History   No history exists.     INTERVAL HISTORY:   Cole Brown is a 77 y.o. male presenting to clinic today for follow up of iron deficiency anemia. He was last seen by me on 05/01/2023.  Today, he states that he is doing well overall. His appetite level is at 100%. His energy level is at 50%.  PAST MEDICAL HISTORY:   Past Medical History: Past Medical History:  Diagnosis Date   Anemia    Anxiety    Depression    Diverticula of colon    2016 colonoscopy   Fibromyalgia    Fibromyalgia    History of GI diverticular bleed    Hypercholesteremia  Hypertension    Multiple gastric ulcers     Surgical History: Past Surgical History:  Procedure Laterality Date   allergy shots  weekly   BIOPSY  02/15/2023   Procedure: BIOPSY;  Surgeon: Dolores Frame, MD;  [facility];  Service: Gastroenterology;;   CHOLECYSTECTOMY     COLONOSCOPY     Dr.Rehman   COLONOSCOPY N/A 01/27/2015   Rehman: multiple diverticula at sigmoid colon,ext hemorrhoids   COLONOSCOPY WITH PROPOFOL N/A 02/15/2023    Procedure: COLONOSCOPY WITH PROPOFOL;  Surgeon: Dolores Frame, MD;  [facility]: [facility];  Service: Gastroenterology;  Laterality: N/A;   ESOPHAGOGASTRODUODENOSCOPY (EGD) WITH PROPOFOL N/A 02/15/2023   Procedure: ESOPHAGOGASTRODUODENOSCOPY (EGD) WITH PROPOFOL;  Surgeon: Dolores Frame, MD;  [facility];  Service: Gastroenterology;  Laterality: N/A;   EYE SURGERY Bilateral    partial cornea transplants    FLEXIBLE SIGMOIDOSCOPY  03/14/2023   Procedure: FLEXIBLE SIGMOIDOSCOPY;  Surgeon: Franky Macho, MD;  [facility]: [facility];  Service: Endoscopy;;   GALLBLADDER SURGERY  12/1993   HOT HEMOSTASIS  02/15/2023   Procedure: HOT HEMOSTASIS (ARGON PLASMA COAGULATION/BICAP);  Surgeon: [name], [name], MD;  Location: [facility];  Service: Gastroenterology;;   Susa Day  02/15/2023   Procedure: Susa Day;  Surgeon: [name], [name], MD;  Location: [facility];  Service: Gastroenterology;;   UPPER GASTROINTESTINAL ENDOSCOPY      Social History: Social History   Socioeconomic History   Marital status: Married    Spouse name: Not on file   Number of children: Not on file   Years of education: Not on file   Highest education level: Not on file  Occupational History   Not on file  Tobacco Use   Smoking status: Never    Passive exposure: Never   Smokeless tobacco: Never  Vaping Use   Vaping status: Never Used  Substance and Sexual Activity   Alcohol use: Not Currently   Drug use: No   Sexual activity: Not on file  Other Topics Concern   Not on file  Social History Narrative   Not on file   Social Determinants of Health   Financial Resource Strain: Not on file  Food Insecurity: No Food Insecurity (02/12/2023)   Hunger Vital Sign    Worried About Running Out of Food in the Last Year: Never true    Ran Out of Food in the Last Year: Never true  Transportation Needs: No Transportation Needs (02/12/2023)   PRAPARE -  Transportation    Lack of Transportation (Medical): No    Lack of Transportation (Non-Medical): No  Physical Activity: Not on file  Stress: Not on file  Social Connections: Not on file  Intimate Partner Violence: Not At Risk (02/12/2023)   Humiliation, Afraid, Rape, and Kick questionnaire    Fear of Current or Ex-Partner: No    Emotionally Abused: No    Physically Abused: No    Sexually Abused: No    Family History: Family History  Problem Relation Age of Onset   Heart disease Mother    Pancreatic cancer Mother    Prostate cancer Father    Healthy Sister    Parkinson's disease Brother    Asthma Sister    Healthy Sister     Current Medications:  Current Outpatient Medications:    acetaminophen (TYLENOL) 500 MG tablet, Take 1,000 mg by mouth every 12 (twelve) hours., Disp: , Rfl:    amLODipine (NORVASC) 5 MG tablet, Take 1 tablet (  5 mg total) by mouth daily., Disp: 90 tablet, Rfl: 3   atenolol (TENORMIN) 25 MG tablet, TAKE (1/2) TABLET BY MOUTH ONCE DAILY. (Patient taking differently: Take 12.5 mg by mouth daily.), Disp: 45 tablet, Rfl: 2   azelastine (ASTELIN) 0.1 % nasal spray, Place 1 spray into both nostrils 2 (two) times daily., Disp: , Rfl:    Cholecalciferol (VITAMIN D) 50 MCG (2000 UT) CAPS, Take 2,000 Units by mouth daily., Disp: , Rfl:    diphenhydrAMINE (BENADRYL) 25 MG tablet, Take 25 mg by mouth daily as needed for allergies., Disp: , Rfl:    EPINEPHrine 0.3 mg/0.3 mL IJ SOAJ injection, Inject 0.3 mg into the muscle as needed for anaphylaxis., Disp: , Rfl:    famotidine (PEPCID) 20 MG tablet, Take 1 tablet (20 mg total) by mouth at bedtime. Takes as needed, Disp: 30 tablet, Rfl: 1   FLUoxetine (PROZAC) 10 MG tablet, Take 10 mg by mouth daily., Disp: , Rfl:    fluticasone (FLONASE) 50 MCG/ACT nasal spray, Place 1-2 sprays into both nostrils daily., Disp: , Rfl:    folic acid (FOLVITE) 1 MG tablet, TAKE 2 TABLETS BY MOUTH DAILY. (Patient taking differently: Take 2 mg by  mouth daily.), Disp: 180 tablet, Rfl: 3   furosemide (LASIX) 20 MG tablet, Take 20 mg by mouth once. Once daily every other day, Disp: , Rfl:    Investigational - Study Medication, Atorvastatin study med. PREVENTABLE trial, Disp: , Rfl:    levocetirizine (XYZAL) 5 MG tablet, Take 5 mg by mouth every evening. , Disp: , Rfl:    montelukast (SINGULAIR) 10 MG tablet, Take 10 mg by mouth at bedtime., Disp: , Rfl:    Multiple Vitamins-Minerals (ICAPS AREDS 2 PO), Take 1 capsule by mouth daily., Disp: , Rfl:    Omega-3 Fatty Acids (FISH OIL) 1000 MG CAPS, Take 1 capsule by mouth daily., Disp: , Rfl:    OVER THE COUNTER MEDICATION, Prep h ointment and cream as needed, Disp: , Rfl:    pantoprazole (PROTONIX) 40 MG tablet, Take 1 tablet (40 mg total) by mouth 2 (two) times daily., Disp: 60 tablet, Rfl: 2   sulfamethoxazole-trimethoprim (BACTRIM DS) 800-160 MG tablet, Take 1 tablet by mouth 2 (two) times daily for 10 days., Disp: 20 tablet, Rfl: 0   Testosterone (ANDROGEL TD), Place onto the skin daily., Disp: , Rfl:    triazolam (HALCION) 0.25 MG tablet, Take 0.25 mg by mouth at bedtime. , Disp: , Rfl:    methotrexate (RHEUMATREX) 2.5 MG tablet, Take 4 tablets (10 mg total) by mouth once a week. (Patient not taking: Reported on 05/29/2023), Disp: 16 tablet, Rfl: 2   Allergies: Allergies  Allergen Reactions   Bee Pollen     Seasonal allergies   Plaquenil [Hydroxychloroquine Sulfate]     rash   Lodine [Etodolac] Swelling and Rash    REVIEW OF SYSTEMS:   Review of Systems  Genitourinary:  Positive for dysuria.   All other systems reviewed and are negative.    VITALS:   Blood pressure 119/75, pulse (!) 55, temperature 97.9 F (36.6 C), temperature source Oral, resp. rate 16, weight 173 lb 8 oz (78.7 kg), SpO2 100%.  Wt Readings from Last 3 Encounters:  05/29/23 173 lb 8 oz (78.7 kg)  05/23/23 171 lb (77.6 kg)  05/01/23 175 lb (79.4 kg)    Body mass index is 24.2 kg/m.  Performance status  (ECOG): 1 - Symptomatic but completely ambulatory  PHYSICAL EXAM:   Physical  Exam Vitals reviewed.  Constitutional:      Appearance: Normal appearance.  Neurological:     Mental Status: He is alert.  Psychiatric:        Mood and Affect: Mood normal.        Behavior: Behavior normal.     LABS:      Latest Ref Rng & Units 05/27/2023   11:25 AM 05/01/2023    3:59 PM 04/25/2023    4:23 PM  CBC  WBC 3.8 - 10.8 Thousand/uL 7.6  6.9  7.7   Hemoglobin 13.2 - 17.1 g/dL 8.8  7.7  7.3   Hematocrit 38.5 - 50.0 % 29.1  26.1  25.2   Platelets 140 - 400 Thousand/uL 265  368  343       Latest Ref Rng & Units 05/27/2023   11:25 AM 04/25/2023    4:23 PM 04/12/2023   10:42 AM  CMP  Glucose 65 - 139 mg/dL 99  829  562   BUN 7 - 25 mg/dL 24  18  16    Creatinine 0.70 - 1.28 mg/dL 1.30  8.65  7.84   Sodium 135 - 146 mmol/L 133  134  135   Potassium 3.5 - 5.3 mmol/L 4.7  4.8  4.2   Chloride 98 - 110 mmol/L 102  100  100   CO2 20 - 32 mmol/L 24  20  22    Calcium 8.6 - 10.3 mg/dL 8.6  8.5  8.7   Total Protein 6.1 - 8.1 g/dL 6.5  6.3    Total Bilirubin 0.2 - 1.2 mg/dL 0.3  0.3    Alkaline Phos 44 - 121 IU/L  158    AST 10 - 35 U/L 12  43    ALT 9 - 46 U/L 12  39       No results found for: "CEA1", "CEA" / No results found for: "CEA1", "CEA" No results found for: "PSA1" No results found for: "CAN199" No results found for: "CAN125"  Lab Results  Component Value Date   TOTALPROTELP 7.0 05/01/2023   TOTALPROTELP 7.0 05/01/2023   ALBUMINELP 3.0 05/01/2023   A1GS 0.3 05/01/2023   A2GS 0.9 05/01/2023   BETS 1.0 05/01/2023   BETA2SER 0.5 08/21/2018   GAMS 1.8 05/01/2023   MSPIKE Not Observed 05/01/2023   SPEI Comment 05/01/2023   Lab Results  Component Value Date   TIBC 415 05/01/2023   TIBC 212 (L) 02/12/2023   TIBC 265 01/03/2023   FERRITIN 25 05/01/2023   FERRITIN 93 02/26/2023   FERRITIN 109 02/12/2023   IRONPCTSAT 5 (L) 05/01/2023   IRONPCTSAT 8 (L) 02/12/2023   IRONPCTSAT  6 (L) 01/03/2023   Lab Results  Component Value Date   LDH 117 05/01/2023     STUDIES:   NM Myocar Multi W/Spect W/Wall Motion / EF  Result Date: 05/07/2023   Stress ECG is negative for ischemia.   LV perfusion is abnormal. Increased radiotracer activity in the stomach resulting in decreased activity in the entire left ventricle / normalization of LV display. There is no evidence of ischemia. There is no evidence of infarction.   Left ventricular function is abnormal. Nuclear stress EF: 40%. End diastolic cavity size is moderately enlarged. End systolic cavity size is mildly enlarged.   Findings are consistent with no ischemia and no infarction. The study is intermediate risk based on nuclear LVEF 40% but visually LVEF appears to be normal. Correlate with 2D Echocardiogram.

## 2023-05-29 NOTE — Patient Instructions (Addendum)
[facility] at [facility] Discharge Instructions   You were seen and examined today by Dr. Ellin Saba.  He reviewed the results of your lab work done at your previous visit. Your B12 is low. We will give you a B12 shot today. Then you will need to take B12 over the counter (1000 mcg or 1 mg) daily. Your iron is also still low. We will plan to give you another iron infusion. Our goal is to get your hemoglobin to 10 or better.   All the other tests we did on the date of your last visit were normal.  We will see you back in 6 weeks. We will repeat lab work prior to this visit.  Return as scheduled.    Thank you for choosing [facility] at [facility] to provide your oncology and hematology care.  To afford each patient quality time with our provider, please arrive at least 15 minutes before your scheduled appointment time.   If you have a lab appointment with the Cancer Center please come in thru the Main Entrance and check in at the main information desk.  You need to re-schedule your appointment should you arrive 10 or more minutes late.  We strive to give you quality time with our providers, and arriving late affects you and other patients whose appointments are after yours.  Also, if you no show three or more times for appointments you may be dismissed from the clinic at the providers discretion.     Again, thank you for choosing [facility].  Our hope is that these requests will decrease the amount of time that you wait before being seen by our physicians.       _____________________________________________________________  Should you have questions after your visit to [facility], please contact our office at [phone] and follow the prompts.  Our office hours are 8:00 a.m. and 4:30 p.m. Monday - Friday.  Please note that voicemails left after 4:00 p.m. may not be returned until the following business day.  We are  closed weekends and major holidays.  You do have access to a nurse 24-7, just call the main number to the clinic [phone] and do not press any options, hold on the line and a nurse will answer the phone.    For prescription refill requests, have your pharmacy contact our office and allow 72 hours.    Due to Covid, you will need to wear a mask upon entering the hospital. If you do not have a mask, a mask will be given to you at the Main Entrance upon arrival. For doctor visits, patients may have 1 support person age 23 or older with them. For treatment visits, patients can not have anyone with them due to social distancing guidelines and our immunocompromised population.

## 2023-05-29 NOTE — Progress Notes (Signed)
Cole Brown presents today for injection per the provider's orders. B12 administration without incident; injection site WNL; see MAR for injection details.  Patient tolerated procedure well and without incident.  No questions or complaints noted at this time. Discharged ambulatory in stable condition.

## 2023-05-30 ENCOUNTER — Ambulatory Visit (HOSPITAL_COMMUNITY)
Admission: RE | Admit: 2023-05-30 | Discharge: 2023-05-30 | Disposition: A | Discharge status: Home / Self Care | Payer: Medicare HMO | Source: Ambulatory Visit | Attending: General Surgery | Admitting: General Surgery

## 2023-05-30 DIAGNOSIS — N3090 Cystitis, unspecified without hematuria: Secondary | ICD-10-CM | POA: Insufficient documentation

## 2023-05-30 DIAGNOSIS — K5732 Diverticulitis of large intestine without perforation or abscess without bleeding: Secondary | ICD-10-CM | POA: Diagnosis not present

## 2023-05-30 DIAGNOSIS — I700 Atherosclerosis of aorta: Secondary | ICD-10-CM | POA: Diagnosis not present

## 2023-05-30 DIAGNOSIS — N321 Vesicointestinal fistula: Secondary | ICD-10-CM | POA: Insufficient documentation

## 2023-05-30 DIAGNOSIS — K56699 Other intestinal obstruction unspecified as to partial versus complete obstruction: Secondary | ICD-10-CM | POA: Diagnosis not present

## 2023-05-30 DIAGNOSIS — K760 Fatty (change of) liver, not elsewhere classified: Secondary | ICD-10-CM | POA: Insufficient documentation

## 2023-05-30 MED ORDER — IOHEXOL 300 MG/ML  SOLN
30.0000 mL | Freq: Once | INTRAMUSCULAR | Status: AC | PRN
  Administered 2023-05-30: 30 mL via ORAL

## 2023-05-30 MED ORDER — IOHEXOL 300 MG/ML  SOLN
100.0000 mL | Freq: Once | INTRAMUSCULAR | Status: AC | PRN
  Administered 2023-05-30: 100 mL via INTRAVENOUS

## 2023-06-01 ENCOUNTER — Encounter (HOSPITAL_COMMUNITY): Payer: Self-pay

## 2023-06-01 ENCOUNTER — Emergency Department (HOSPITAL_COMMUNITY)
Admission: EM | Admit: 2023-06-01 | Discharge: 2023-06-01 | Disposition: A | Discharge status: Home / Self Care | Payer: Medicare HMO | Attending: Student | Admitting: Student

## 2023-06-01 ENCOUNTER — Other Ambulatory Visit: Payer: Self-pay

## 2023-06-01 ENCOUNTER — Emergency Department (HOSPITAL_COMMUNITY): Payer: Medicare HMO

## 2023-06-01 DIAGNOSIS — K4090 Unilateral inguinal hernia, without obstruction or gangrene, not specified as recurrent: Secondary | ICD-10-CM | POA: Diagnosis not present

## 2023-06-01 DIAGNOSIS — I10 Essential (primary) hypertension: Secondary | ICD-10-CM | POA: Diagnosis not present

## 2023-06-01 DIAGNOSIS — R300 Dysuria: Secondary | ICD-10-CM | POA: Diagnosis not present

## 2023-06-01 DIAGNOSIS — R10823 Right lower quadrant rebound abdominal tenderness: Secondary | ICD-10-CM | POA: Insufficient documentation

## 2023-06-01 DIAGNOSIS — R109 Unspecified abdominal pain: Secondary | ICD-10-CM | POA: Diagnosis not present

## 2023-06-01 DIAGNOSIS — R1031 Right lower quadrant pain: Secondary | ICD-10-CM | POA: Diagnosis not present

## 2023-06-01 DIAGNOSIS — N390 Urinary tract infection, site not specified: Secondary | ICD-10-CM

## 2023-06-01 DIAGNOSIS — N321 Vesicointestinal fistula: Secondary | ICD-10-CM

## 2023-06-01 LAB — CBC WITH DIFFERENTIAL/PLATELET
Abs Immature Granulocytes: 0.03 10*3/uL (ref 0.00–0.07)
Basophils Absolute: 0.1 10*3/uL (ref 0.0–0.1)
Basophils Relative: 1 %
Eosinophils Absolute: 0.2 10*3/uL (ref 0.0–0.5)
Eosinophils Relative: 2 %
HCT: 31.8 % — ABNORMAL LOW (ref 39.0–52.0)
Hemoglobin: 9.8 g/dL — ABNORMAL LOW (ref 13.0–17.0)
Immature Granulocytes: 0 %
Lymphocytes Relative: 15 %
Lymphs Abs: 1.5 10*3/uL (ref 0.7–4.0)
MCH: 25.3 pg — ABNORMAL LOW (ref 26.0–34.0)
MCHC: 30.8 g/dL (ref 30.0–36.0)
MCV: 82.2 fL (ref 80.0–100.0)
Monocytes Absolute: 0.7 10*3/uL (ref 0.1–1.0)
Monocytes Relative: 7 %
Neutro Abs: 7.8 10*3/uL — ABNORMAL HIGH (ref 1.7–7.7)
Neutrophils Relative %: 75 %
Platelets: 269 10*3/uL (ref 150–400)
RBC: 3.87 MIL/uL — ABNORMAL LOW (ref 4.22–5.81)
RDW: 26 % — ABNORMAL HIGH (ref 11.5–15.5)
WBC: 10.4 10*3/uL (ref 4.0–10.5)
nRBC: 0 % (ref 0.0–0.2)

## 2023-06-01 LAB — COMPREHENSIVE METABOLIC PANEL
ALT: 16 U/L (ref 0–44)
AST: 14 U/L — ABNORMAL LOW (ref 15–41)
Albumin: 3.5 g/dL (ref 3.5–5.0)
Alkaline Phosphatase: 111 U/L (ref 38–126)
Anion gap: 9 (ref 5–15)
BUN: 26 mg/dL — ABNORMAL HIGH (ref 8–23)
CO2: 20 mmol/L — ABNORMAL LOW (ref 22–32)
Calcium: 9 mg/dL (ref 8.9–10.3)
Chloride: 102 mmol/L (ref 98–111)
Creatinine, Ser: 1.29 mg/dL — ABNORMAL HIGH (ref 0.61–1.24)
GFR, Estimated: 56 mL/min — ABNORMAL LOW (ref >60)
Glucose, Bld: 113 mg/dL — ABNORMAL HIGH (ref 70–99)
Potassium: 4.3 mmol/L (ref 3.5–5.1)
Sodium: 131 mmol/L — ABNORMAL LOW (ref 135–145)
Total Bilirubin: 0.3 mg/dL (ref <1.2)
Total Protein: 7 g/dL (ref 6.5–8.1)

## 2023-06-01 LAB — URINALYSIS, ROUTINE W REFLEX MICROSCOPIC
Bilirubin Urine: NEGATIVE
Glucose, UA: NEGATIVE mg/dL
Ketones, ur: NEGATIVE mg/dL
Nitrite: NEGATIVE
Protein, ur: 30 mg/dL — AB
RBC / HPF: >50 RBC/hpf (ref 0–5)
Specific Gravity, Urine: 1.016 (ref 1.005–1.030)
WBC, UA: >50 WBC/hpf (ref 0–5)
pH: 5 (ref 5.0–8.0)

## 2023-06-01 MED ORDER — FOSFOMYCIN TROMETHAMINE 3 G PO PACK
3.0000 g | PACK | Freq: Once | ORAL | Status: AC
Start: 2023-06-01 — End: 2023-06-01
  Administered 2023-06-01: 3 g via ORAL
  Filled 2023-06-01: qty 3

## 2023-06-01 MED ORDER — LACTATED RINGERS IV BOLUS: 1000.0000 mL | Freq: Once | INTRAVENOUS | Status: DC

## 2023-06-01 NOTE — ED Triage Notes (Signed)
Pt reports he passed 4 kidney stones today and is still having right side flank pain and pain at the tip of his penis.

## 2023-06-01 NOTE — ED Notes (Signed)
Gave pt water to encourage urine sample. CT tech at beside transporting pt at this time.

## 2023-06-01 NOTE — ED Provider Notes (Signed)
[place] EMERGENCY DEPARTMENT AT [facility] Provider Note  CSN: [id number] Arrival date & time: 06/01/23 1853  Chief Complaint(s) Flank Pain  HPI Cole Brown is a 77 y.o. 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  Patient states that starting this morning he has noticed that he has passed 4 kidney stones.  No previous history of kidney stones.  Endorses persistent right lower quadrant right flank pain.  Denies associated nausea, vomiting, chest pain, shortness of breath or other systemic symptoms.  Recently completed a course of Bactrim for multidrug-resistant Klebsiella UTI on 05/24/2023.   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   Duodenal ulcer 02/16/2023   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   Gastrointestinal hemorrhage 02/15/2023   Protein-calorie malnutrition, severe 02/14/2023   Acute blood loss anemia 02/12/2023   Perforation of sigmoid colon (HCC) 01/08/2023   Acute post-hemorrhagic anemia 01/08/2023   Diverticulitis 01/03/2023   Hyponatremia 01/03/2023   Hematochezia 12/25/2022   Diverticulosis of colon without hemorrhage 12/25/2022   Diarrhea 03/30/2021   Hemorrhoids 03/30/2021   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   Lower abdominal pain 07/06/2019   Autoimmune disease (HCC) 09/15/2018   [name] disease without gangrene 09/15/2018   Hypercholesteremia 08/21/2018   History of  diverticulitis 08/21/2018   History of iron deficiency anemia 08/21/2018   Fibromyalgia 08/21/2018   History of multiple allergies 08/21/2018   History of sleep apnea 08/21/2018   Rectal bleed 11/14/2016   Essential hypertension 11/14/2016   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 Authorizing Provider  acetaminophen (TYLENOL) 500 MG tablet Take 1,000 mg by mouth every 12 (twelve) hours.    [provider]  amLODipine (NORVASC) 5 MG tablet Take 1 tablet (5 mg total) by mouth daily. 08/28/22   [name], NP  atenolol (TENORMIN) 25 MG tablet TAKE (1/2) TABLET BY MOUTH ONCE DAILY. Patient taking differently: Take 12.5 mg by mouth daily. 12/03/22   [name], MD  azelastine (ASTELIN) 0.1 % nasal spray Place 1 spray into both nostrils 2 (two) times daily. 09/17/19   [provider]  Cholecalciferol (VITAMIN D) 50 MCG (2000 UT) CAPS Take 2,000 Units by mouth daily.    [provider]  diphenhydrAMINE (BENADRYL) 25 MG tablet Take 25 mg by mouth daily as needed for allergies.    [provider]  EPINEPHrine 0.3 mg/0.3 mL IJ SOAJ injection Inject 0.3 mg into the muscle as needed for anaphylaxis. 10/14/19   [provider]  famotidine (PEPCID) 20 MG tablet Take 1 tablet (20 mg total) by mouth at bedtime. Takes as needed 02/18/23   [name], MD  FLUoxetine (PROZAC) 10 MG tablet Take 10 mg by mouth daily.    [provider]  fluticasone (FLONASE) 50 MCG/ACT nasal spray Place 1-2 sprays into both nostrils daily. 07/27/20  [provider]  folic acid (FOLVITE) 1 MG tablet TAKE 2 TABLETS BY MOUTH DAILY. Patient taking differently: Take 2 mg by mouth daily. 10/12/22   Gearldine Bienenstock, PA-C  furosemide (LASIX) 20 MG tablet Take 20 mg by mouth once. Once daily every other day    [provider]  Investigational - Study Medication  Atorvastatin study med. PREVENTABLE trial    [provider]  levocetirizine (XYZAL) 5 MG tablet Take 5 mg by mouth every evening.     [provider]  methotrexate (RHEUMATREX) 2.5 MG tablet Take 4 tablets (10 mg total) by mouth once a week. Patient not taking: Reported on 05/29/2023 04/23/23   Pollyann Savoy, MD  montelukast (SINGULAIR) 10 MG tablet Take 10 mg by mouth at bedtime.    [provider]  Multiple Vitamins-Minerals (ICAPS AREDS 2 PO) Take 1 capsule by mouth daily.    [provider]  Omega-3 Fatty Acids (FISH OIL) 1000 MG CAPS Take 1 capsule by mouth daily.    [provider]  OVER THE COUNTER MEDICATION Prep h ointment and cream as needed    [provider]  pantoprazole (PROTONIX) 40 MG tablet Take 1 tablet (40 mg total) by mouth 2 (two) times daily. 02/18/23 02/18/24  [name], MD  sulfamethoxazole-trimethoprim (BACTRIM DS) 800-160 MG tablet Take 1 tablet by mouth 2 (two) times daily for 10 days. 05/24/23 06/03/23  Leath-Warren, Sadie Haber, NP  Testosterone (ANDROGEL TD) Place onto the skin daily.    [provider]  triazolam (HALCION) 0.25 MG tablet Take 0.25 mg by mouth at bedtime.     [provider]                                                                                                                                    Past Surgical History Past Surgical History:  Procedure Laterality Date   allergy shots  weekly   BIOPSY  02/15/2023   Procedure: BIOPSY;  Surgeon: Dolores Frame, MD;  Location: [facility];  Service: Gastroenterology;;   CHOLECYSTECTOMY     COLONOSCOPY     Dr.Rehman   COLONOSCOPY N/A 01/27/2015   Rehman: multiple diverticula at sigmoid colon,ext hemorrhoids   COLONOSCOPY WITH PROPOFOL N/A 02/15/2023   Procedure: COLONOSCOPY WITH PROPOFOL;  Surgeon: Dolores Frame, MD;  Location: [facility];  Service: Gastroenterology;  Laterality: N/A;    ESOPHAGOGASTRODUODENOSCOPY (EGD) WITH PROPOFOL N/A 02/15/2023   Procedure: ESOPHAGOGASTRODUODENOSCOPY (EGD) WITH PROPOFOL;  Surgeon: Dolores Frame, MD;  Location: [facility];  Service: Gastroenterology;  Laterality: N/A;   EYE SURGERY Bilateral    partial cornea transplants    FLEXIBLE SIGMOIDOSCOPY  03/14/2023   Procedure: FLEXIBLE SIGMOIDOSCOPY;  Surgeon: Franky Macho, MD;  Location: [facility];  Service: Endoscopy;;   GALLBLADDER SURGERY  12/1993   HOT HEMOSTASIS  02/15/2023   Procedure: HOT HEMOSTASIS (ARGON PLASMA COAGULATION/BICAP);  Surgeon: [name], [name], MD;  Location: [facility];  Service: Gastroenterology;;   Susa Day  02/15/2023   Procedure: Susa Day;  Surgeon: [name], [name], MD;  Location: [facility];  Service: Gastroenterology;;   UPPER GASTROINTESTINAL ENDOSCOPY     Family History Family History  Problem Relation Age of Onset   Heart disease Mother    Pancreatic cancer Mother    Prostate cancer Father    Healthy Sister    Parkinson's disease Brother    Asthma Sister    Healthy Sister     Social History Social History   Tobacco Use   Smoking status: Never    Passive exposure: Never   Smokeless tobacco: Never  Vaping Use   Vaping status: Never Used  Substance Use Topics   Alcohol use: Not Currently   Drug use: No   Allergies Bee pollen, Plaquenil [hydroxychloroquine sulfate], and Lodine [etodolac]  Review of Systems Review of Systems  Genitourinary:  Positive for flank pain.    Physical Exam Vital Signs  I have reviewed the triage vital signs BP 132/67 (BP Location: Right Arm)   Pulse 61   Temp 98.4 F (36.9 C) (Oral)   Resp 16   Wt 78.7 kg   SpO2 99%   BMI 24.20 kg/m   Physical Exam Constitutional:      General: He is not in acute distress.    Appearance: Normal appearance.  HENT:     Head: Normocephalic and atraumatic.     Nose: No congestion or rhinorrhea.  Eyes:     General:         Right eye: No discharge.        Left eye: No discharge.     Extraocular Movements: Extraocular movements intact.     Pupils: Pupils are equal, round, and reactive to light.  Cardiovascular:     Rate and Rhythm: Normal rate and regular rhythm.     Heart sounds: No murmur heard. Pulmonary:     Effort: No respiratory distress.     Breath sounds: No wheezing or rales.  Abdominal:     General: There is no distension.     Tenderness: There is no abdominal tenderness. There is right CVA tenderness.  Musculoskeletal:        General: Normal range of motion.     Cervical back: Normal range of motion.  Skin:    General: Skin is warm and dry.  Neurological:     General: No focal deficit present.     Mental Status: He is alert.     ED Results and Treatments Labs (all labs ordered are listed, but only abnormal results are displayed) Labs Reviewed  COMPREHENSIVE METABOLIC PANEL - Abnormal; Notable for the following components:      Result Value   Sodium 131 (*)    CO2 20 (*)    Glucose, Bld 113 (*)    BUN 26 (*)    Creatinine, Ser 1.29 (*)    AST 14 (*)    GFR, Estimated 56 (*)    All other components within normal limits  CBC WITH DIFFERENTIAL/PLATELET - Abnormal; Notable for the following components:   RBC 3.87 (*)    Hemoglobin 9.8 (*)    HCT 31.8 (*)    MCH 25.3 (*)    RDW 26.0 (*)    Neutro Abs 7.8 (*)    All other components within normal limits  URINALYSIS, ROUTINE W REFLEX MICROSCOPIC  Radiology No results found.  Pertinent labs & imaging results that were available during my care of the patient were reviewed by me and considered in my medical decision making (see MDM for details).  Medications Ordered in ED Medications - No data to display                                                                                                                                    Procedures Procedures  (including critical care time)  Medical Decision Making / ED Course   This patient presents to the ED for concern of flank pain, dysuria, this involves an extensive number of treatment options, and is a complaint that carries with it a high risk of complications and morbidity.  The differential diagnosis includes nephrolithiasis, pyelonephritis, obstruction, AAA, musculoskeletal strain, vertebral fracture, intra-abdominal abscess, diverticulitis  MDM: Patient seen emergency room for evaluation of flank pain and dysuria.  Physical exam with mild right CVA tenderness, suprapubic tenderness but is otherwise unremarkable.  Laboratory evaluation with a hemoglobin of 9.8 which is improved from previous, sodium 131, BUN 26, creatinine 1.29 which is slightly uptrending from previous.  Urinalysis with large leuk esterase, greater than 50 red blood cells, greater than 50 white blood cells and few bacteria with white blood cell clumps.  CT stone study with evidence of a colovesicular fistula but no stone seen.  Previous culture data showing Klebsiella sensitive to Bactrim of which the patient does have 3 additional days left but we will cover with single use fosfomycin here in the ER today especially in the setting the colovesicular fistula and likely constant colonization of bacteria.  We will treat today because the patient is symptomatic but he will need to discuss an antibiotic plan with his general surgeon Dr. Lovell Sheehan and his outpatient urologist as I suspect almost all future urinalyses are going to be positive.  On evaluation, patient remains hemodynamically stable and currently does not meet inpatient criteria for admission and will be discharged with outpatient follow-up return precautions given of which he and his wife voiced understanding.   Additional history obtained: -Additional history obtained from wife -External records from outside source  obtained and reviewed including: Chart review including previous notes, labs, imaging, consultation notes   Lab Tests: -I ordered, reviewed, and interpreted labs.   The pertinent results include:   Labs Reviewed  COMPREHENSIVE METABOLIC PANEL - Abnormal; Notable for the following components:      Result Value   Sodium 131 (*)    CO2 20 (*)    Glucose, Bld 113 (*)    BUN 26 (*)    Creatinine, Ser 1.29 (*)    AST 14 (*)    GFR, Estimated 56 (*)    All other components within normal limits  CBC WITH DIFFERENTIAL/PLATELET - Abnormal; Notable for the following components:   RBC 3.87 (*)    Hemoglobin 9.8 (*)  HCT 31.8 (*)    MCH 25.3 (*)    RDW 26.0 (*)    Neutro Abs 7.8 (*)    All other components within normal limits  URINALYSIS, ROUTINE W REFLEX MICROSCOPIC    Imaging Studies ordered: I ordered imaging studies including CT stone study I independently visualized and interpreted imaging. I agree with the radiologist interpretation   Medicines ordered and prescription drug management: Meds ordered this encounter  Medications   DISCONTD: lactated ringers bolus 1,000 mL    -I have reviewed the patients home medicines and have made adjustments as needed  Critical interventions none   Cardiac Monitoring: The patient was maintained on a cardiac monitor.  I personally viewed and interpreted the cardiac monitored which showed an underlying rhythm of: NSR  Social Determinants of Health:  Factors impacting patients care include: none   Reevaluation: After the interventions noted above, I reevaluated the patient and found that they have :improved  Co morbidities that complicate the patient evaluation  Past Medical History:  Diagnosis Date   Anemia    Anxiety    Depression    Diverticula of colon    2016 colonoscopy   Fibromyalgia    Fibromyalgia    History of GI diverticular bleed    Hypercholesteremia    Hypertension    Multiple gastric ulcers        Dispostion: I considered admission for this patient, but at this time he does not meet inpatient criteria for admission and will be discharged with outpatient follow-up     Final Clinical Impression(s) / ED Diagnoses Final diagnoses:  None     @PCDICTATION @    Glendora Score, MD 06/02/23 1302

## 2023-06-01 NOTE — ED Notes (Signed)
Gave pt 591 ml gatorade to drink. Pt tolerating well. Informed pt we are awaiting ct and urine results.

## 2023-06-02 NOTE — Progress Notes (Signed)
Anticardiolipin IgM remains positive at stable titer, beta-2 GP 1 negative, ANA positive and stable, complements normal, sed rate is mildly elevated, urine protein is elevated, CMP normal, hemoglobin is low at 8.8 but improved.  Patient should take multivitamin with iron.  Please forward results to his PCP.  Please have patient repeat urine protein creatinine ratio in 1 month.

## 2023-06-03 ENCOUNTER — Inpatient Hospital Stay: Payer: Medicare HMO

## 2023-06-03 ENCOUNTER — Other Ambulatory Visit: Payer: Self-pay | Admitting: *Deleted

## 2023-06-03 VITALS — BP 125/66 | HR 72 | Temp 97.8°F | Resp 20

## 2023-06-03 DIAGNOSIS — Z79899 Other long term (current) drug therapy: Secondary | ICD-10-CM

## 2023-06-03 DIAGNOSIS — E538 Deficiency of other specified B group vitamins: Secondary | ICD-10-CM | POA: Diagnosis not present

## 2023-06-03 DIAGNOSIS — R760 Raised antibody titer: Secondary | ICD-10-CM

## 2023-06-03 DIAGNOSIS — D500 Iron deficiency anemia secondary to blood loss (chronic): Secondary | ICD-10-CM

## 2023-06-03 DIAGNOSIS — M359 Systemic involvement of connective tissue, unspecified: Secondary | ICD-10-CM

## 2023-06-03 DIAGNOSIS — Z79631 Long term (current) use of antimetabolite agent: Secondary | ICD-10-CM | POA: Diagnosis not present

## 2023-06-03 DIAGNOSIS — I7300 Raynaud's syndrome without gangrene: Secondary | ICD-10-CM

## 2023-06-03 DIAGNOSIS — K922 Gastrointestinal hemorrhage, unspecified: Secondary | ICD-10-CM | POA: Diagnosis not present

## 2023-06-03 DIAGNOSIS — Z862 Personal history of diseases of the blood and blood-forming organs and certain disorders involving the immune mechanism: Secondary | ICD-10-CM

## 2023-06-03 MED ORDER — ACETAMINOPHEN 325 MG PO TABS
650.0000 mg | ORAL_TABLET | Freq: Once | ORAL | Status: AC
  Administered 2023-06-03: 650 mg via ORAL
  Filled 2023-06-03: qty 2

## 2023-06-03 MED ORDER — SODIUM CHLORIDE 0.9 % IV SOLN: INTRAVENOUS | Status: DC

## 2023-06-03 MED ORDER — METHYLPREDNISOLONE SODIUM SUCC 125 MG IJ SOLR
125.0000 mg | Freq: Once | INTRAMUSCULAR | Status: AC
  Administered 2023-06-03: 125 mg via INTRAVENOUS
  Filled 2023-06-03: qty 2

## 2023-06-03 MED ORDER — SODIUM CHLORIDE 0.9 % IV SOLN
1000.0000 mg | Freq: Once | INTRAVENOUS | Status: AC
  Administered 2023-06-03: 1000 mg via INTRAVENOUS
  Filled 2023-06-03: qty 20

## 2023-06-03 MED ORDER — CETIRIZINE HCL 10 MG/ML IV SOLN
10.0000 mg | Freq: Once | INTRAVENOUS | Status: AC
Start: 2023-06-03 — End: 2023-06-03
  Administered 2023-06-03: 10 mg via INTRAVENOUS
  Filled 2023-06-03: qty 1

## 2023-06-03 MED ORDER — FAMOTIDINE IN NACL 20-0.9 MG/50ML-% IV SOLN
20.0000 mg | Freq: Once | INTRAVENOUS | Status: AC
  Administered 2023-06-03: 20 mg via INTRAVENOUS
  Filled 2023-06-03: qty 50

## 2023-06-03 NOTE — Progress Notes (Signed)
Patient presents today for Infed per providers order.  Vital signs WNL.  Patient has no new complaints at this time.    Peripheral IV started and blood return noted pre and post infusion.  Stable during infusion without adverse affects.  Vital signs stable.  No complaints at this time.  Discharge from clinic ambulatory in stable condition.  Alert and oriented X 3.  Follow up with [facility] as scheduled.

## 2023-06-03 NOTE — Patient Instructions (Signed)
 [facility] [place] - A DEPT OF [facility]  Discharge Instructions: Thank you for choosing [facility] to provide your oncology and hematology care.  If you have a lab appointment with the Cancer Center - please note that after April 8th, 2024, all labs will be drawn in the cancer center.  You do not have to check in or register with the main entrance as you have in the past but will complete your check-in in the cancer center.  Wear comfortable clothing and clothing appropriate for easy access to any Portacath or PICC line.   We strive to give you quality time with your provider. You may need to reschedule your appointment if you arrive late (15 or more minutes).  Arriving late affects you and other patients whose appointments are after yours.  Also, if you miss three or more appointments without notifying the office, you may be dismissed from the clinic at the provider's discretion.      For prescription refill requests, have your pharmacy contact our office and allow 72 hours for refills to be completed.    Today you received the following chemotherapy and/or immunotherapy agents Infed      To help prevent nausea and vomiting after your treatment, we encourage you to take your nausea medication as directed.  BELOW ARE SYMPTOMS THAT SHOULD BE REPORTED IMMEDIATELY: *FEVER GREATER THAN 100.4 F (38 C) OR HIGHER *CHILLS OR SWEATING *NAUSEA AND VOMITING THAT IS NOT CONTROLLED WITH YOUR NAUSEA MEDICATION *UNUSUAL SHORTNESS OF BREATH *UNUSUAL BRUISING OR BLEEDING *URINARY PROBLEMS (pain or burning when urinating, or frequent urination) *BOWEL PROBLEMS (unusual diarrhea, constipation, pain near the anus) TENDERNESS IN MOUTH AND THROAT WITH OR WITHOUT PRESENCE OF ULCERS (sore throat, sores in mouth, or a toothache) UNUSUAL RASH, SWELLING OR PAIN  UNUSUAL VAGINAL DISCHARGE OR ITCHING   Items with * indicate a potential emergency and should be followed up as  soon as possible or go to the Emergency Department if any problems should occur.  Please show the CHEMOTHERAPY ALERT CARD or IMMUNOTHERAPY ALERT CARD at check-in to the Emergency Department and triage nurse.  Should you have questions after your visit or need to cancel or reschedule your appointment, please contact [facility] [place] - A DEPT OF [facility] [phone]  and follow the prompts.  Office hours are 8:00 a.m. to 4:30 p.m. Monday - Friday. Please note that voicemails left after 4:00 p.m. may not be returned until the following business day.  We are closed weekends and major holidays. You have access to a nurse at all times for urgent questions. Please call the main number to the clinic [phone] and follow the prompts.  For any non-urgent questions, you may also contact your provider using MyChart. We now offer e-Visits for anyone 6 and older to request care online for non-urgent symptoms. For details visit [email or web address].   Also download the MyChart app! Go to the app store, search "MyChart", open the app, select [place], and log in with your MyChart username and password.

## 2023-06-06 ENCOUNTER — Ambulatory Visit: Payer: Medicare HMO | Admitting: General Surgery

## 2023-06-06 ENCOUNTER — Encounter: Payer: Self-pay | Admitting: General Surgery

## 2023-06-06 VITALS — BP 127/72 | HR 54 | Temp 97.4°F | Resp 12 | Ht 71.0 in | Wt 177.0 lb

## 2023-06-06 DIAGNOSIS — N321 Vesicointestinal fistula: Secondary | ICD-10-CM | POA: Diagnosis not present

## 2023-06-06 MED ORDER — METRONIDAZOLE 500 MG PO TABS: 500.0000 mg | ORAL_TABLET | Freq: Three times a day (TID) | ORAL | 0 refills | Status: DC

## 2023-06-06 MED ORDER — NEOMYCIN SULFATE 500 MG PO TABS: 1000.0000 mg | ORAL_TABLET | Freq: Three times a day (TID) | ORAL | 0 refills | Status: DC

## 2023-06-06 MED ORDER — SUTAB 1479-225-188 MG PO TABS: 24.0000 | ORAL_TABLET | Freq: Once | ORAL | 0 refills | Status: AC

## 2023-06-07 NOTE — Progress Notes (Signed)
Subjective:     Cole Brown  Patient returns back for reviewing CT scanning of abdomen and pelvis with rectal contrast.  He continues to have a colovesical fistula that is present.  He is having recurrent episodes of urinary tract infection and passing air when voiding.  His bowel movements are regular. Objective:    BP 127/72   Pulse (!) 54   Temp (!) 97.4 F (36.3 C) (Oral)   Resp 12   Ht 5\' 11"  (1.803 m)   Wt 177 lb (80.3 kg)   SpO2 94%   BMI 24.69 kg/m   General:  alert, cooperative, and no distress  Lungs clear to auscultation with equal breath sounds bilaterally Heart examination reveals a regular rate and rhythm without S3, S4, murmurs Abdomen is soft, nontender, nondistended.     Assessment:    Colovesical fistula , not resolving Recurrent UTI  Patient has been cleared by cardiology for surgery. Plan:   Patient is scheduled for a partial colectomy, repair of colovesical fistula on 06/21/2023.  The risks and benefits of the procedure including bleeding, infection, the need for blood transfusion, cardiopulmonary difficulties, and the possibility of a colostomy were fully explained to the patient, who gave informed consent.  Sutabs, Flagyl, neomycin have been prescribed for preoperative bowel preparation.

## 2023-06-07 NOTE — H&P (Signed)
Cole Brown is an 77 y.o. male.   Chief Complaint: Colovesical fistula HPI: Patient is a 77 year old white male who was referred to my care by Dr. Tasia Catchings of gastroenterology for evaluation treatment of a colovesical fistula.  Patient has had multiple ER visits and admissions for recurrent UTIs.  He was found on colonoscopy to have a stricture in the distal sigmoid colon.  A complete colonoscopy could not be performed.  A CT scan of the abdomen and pelvis done at that time revealed inflammation with diverticulosis around the sigmoid colon with a colovesical fistula present along the superior dome of the bladder.  He needed a cardiology workup and has been cleared for surgery.  He continues to pass air when voiding and a recent CT scan of the abdomen pelvis with rectal contrast reveals an ongoing colovesical fistula.  He denies any fever or chills.  His bowel movements have been regular.  Past Medical History:  Diagnosis Date   Anemia    Anxiety    Depression    Diverticula of colon    2016 colonoscopy   Fibromyalgia    Fibromyalgia    History of GI diverticular bleed    Hypercholesteremia    Hypertension    Multiple gastric ulcers     Past Surgical History:  Procedure Laterality Date   allergy shots  weekly   BIOPSY  02/15/2023   Procedure: BIOPSY;  Surgeon: Dolores Frame, MD;  Location: [facility];  Service: Gastroenterology;;   CHOLECYSTECTOMY     COLONOSCOPY     Dr.Rehman   COLONOSCOPY N/A 01/27/2015   Rehman: multiple diverticula at sigmoid colon,ext hemorrhoids   COLONOSCOPY WITH PROPOFOL N/A 02/15/2023   Procedure: COLONOSCOPY WITH PROPOFOL;  Surgeon: Dolores Frame, MD;  Location: [facility];  Service: Gastroenterology;  Laterality: N/A;   ESOPHAGOGASTRODUODENOSCOPY (EGD) WITH PROPOFOL N/A 02/15/2023   Procedure: ESOPHAGOGASTRODUODENOSCOPY (EGD) WITH PROPOFOL;  Surgeon: Dolores Frame, MD;  Location: [facility];  Service:  Gastroenterology;  Laterality: N/A;   EYE SURGERY Bilateral    partial cornea transplants    FLEXIBLE SIGMOIDOSCOPY  03/14/2023   Procedure: FLEXIBLE SIGMOIDOSCOPY;  Surgeon: Franky Macho, MD;  Location: [facility];  Service: Endoscopy;;   GALLBLADDER SURGERY  12/1993   HOT HEMOSTASIS  02/15/2023   Procedure: HOT HEMOSTASIS (ARGON PLASMA COAGULATION/BICAP);  Surgeon: [name], [name], MD;  Location: [facility];  Service: Gastroenterology;;   Susa Day  02/15/2023   Procedure: Susa Day;  Surgeon: [name], [name], MD;  Location: [facility];  Service: Gastroenterology;;   UPPER GASTROINTESTINAL ENDOSCOPY      Family History  Problem Relation Age of Onset   Heart disease Mother    Pancreatic cancer Mother    Prostate cancer Father    Healthy Sister    Parkinson's disease Brother    Asthma Sister    Healthy Sister    Social History:  reports that he has never smoked. He has never been exposed to tobacco smoke. He has never used smokeless tobacco. He reports that he does not currently use alcohol. He reports that he does not use drugs.  Allergies:  Allergies  Allergen Reactions   Bee Pollen     Seasonal allergies   Plaquenil [Hydroxychloroquine Sulfate]     rash   Lodine [Etodolac] Swelling and Rash    No medications prior to admission.    No results found for this or any previous visit (from the past 48 hours). No results  found.  Review of Systems  Constitutional: Negative.   HENT: Negative.    Eyes: Negative.   Respiratory: Negative.    Cardiovascular: Negative.   Gastrointestinal: Negative.   Endocrine: Negative.   Genitourinary:  Positive for dysuria.  Musculoskeletal:  Positive for joint swelling.  Allergic/Immunologic: Negative.   Neurological: Negative.   Hematological: Negative.   Psychiatric/Behavioral: Negative.      There were no vitals taken for this visit. Physical Exam Vitals reviewed.  Constitutional:       Appearance: Normal appearance. He is normal weight. He is not ill-appearing.  HENT:     Head: Normocephalic and atraumatic.  Cardiovascular:     Rate and Rhythm: Normal rate and regular rhythm.     Heart sounds: Normal heart sounds. No murmur heard.    No friction rub. No gallop.  Pulmonary:     Effort: Pulmonary effort is normal. No respiratory distress.     Breath sounds: Normal breath sounds. No stridor. No wheezing, rhonchi or rales.  Abdominal:     General: Abdomen is flat. Bowel sounds are normal. There is no distension.     Palpations: Abdomen is soft. There is no mass.     Tenderness: There is no abdominal tenderness. There is no guarding or rebound.     Hernia: No hernia is present.  Skin:    General: Skin is warm and dry.  Neurological:     Mental Status: He is alert and oriented to person, place, and time.      Assessment/Plan Impression: Colovesical fistula, chronic anemia, recurrent UTIs Plan: Patient is scheduled for a partial colectomy with repair of colovesical fistula on 06/21/2023.  The risks and benefits of the procedure including bleeding, infection, cardiopulmonary difficulties, the possibility of needing a blood transfusion, and the possibility of a colostomy were fully explained to the patient, who gave informed consent.  Sutabs, neomycin and Flagyl have been written for preoperative bowel preparation.  Franky Macho, MD 06/07/2023, 3:48 PM

## 2023-06-13 ENCOUNTER — Other Ambulatory Visit (INDEPENDENT_AMBULATORY_CARE_PROVIDER_SITE_OTHER): Payer: Self-pay

## 2023-06-13 ENCOUNTER — Telehealth (INDEPENDENT_AMBULATORY_CARE_PROVIDER_SITE_OTHER): Payer: Self-pay

## 2023-06-13 DIAGNOSIS — K259 Gastric ulcer, unspecified as acute or chronic, without hemorrhage or perforation: Secondary | ICD-10-CM

## 2023-06-13 MED ORDER — PANTOPRAZOLE SODIUM 40 MG PO TBEC: 40.0000 mg | DELAYED_RELEASE_TABLET | Freq: Every day | ORAL | 3 refills | Status: AC

## 2023-06-13 NOTE — Telephone Encounter (Signed)
Ok, so continue the Protonix 40 mg once per day, not bid? And do not continue the famotidine? Also will you be willing to take these over for him? If so I can send these just need to know if you are taking over and which ones you want to continue with the directions. Please advise. Thanks

## 2023-06-13 NOTE — Telephone Encounter (Signed)
Patient wife wants to know if the patient is still in need of taking Protonix 40 mg bid, famotidine 20 mg at bedtime prn, he denies any reflux symptoms while on these medications. They want to know if the patient still needs to be on these medications, if so would you mind taking these over for them as the Dr. That prescribed them says he does not refill medications. If patient is to continue these and you take them over for him, he will need these sent into [facility]. Please advise.

## 2023-06-13 NOTE — Telephone Encounter (Signed)
Hi Crystal if you can let him know that he had a bad gastric ulcer few months back, I advise continue taking protonix once daily

## 2023-06-13 NOTE — Telephone Encounter (Signed)
I called and spoke with the patient made aware we will take over and he needs to discontinue the famotidine and start taking Protonix 40 mg once per day. I have submitted the script to [facility] as the patient requested and discontinued the famotidine off of the medication list. The patient states understanding.

## 2023-06-14 NOTE — Patient Instructions (Signed)
Cole Brown  06/14/2023     @PREFPERIOPPHARMACY @   Your procedure is scheduled on  06/21/2023.   Report to [facility] at  0700 A.M.   Call this number if you have problems the morning of surgery:  [phone]  If you experience any cold or flu symptoms such as cough, fever, chills, shortness of breath, etc. between now and your scheduled surgery, please notify us at the above number.   Remember:  Do not eat after midnight.        Drink 2 carb drinks before bed on 06/20/2023.   You may drink clear liquids until  0500 am on 06/21/2023.      Clear liquids allowed are:                    Water, Juice (No red color; non-citric and without pulp; diabetics please choose diet or no sugar options), Carbonated beverages (diabetics please choose diet or no sugar options), Clear Tea (No creamer, milk, or cream, including half & half and powdered creamer), Black Coffee Only (No creamer, milk or cream, including half & half and powdered creamer), and Clear Sports drink (No red color; diabetics please choose diet or no sugar options)          At 0500 am on 06/21/2023 drink 1 carb drink. You can have nothing else to drink after this.   Take these medicines the morning of surgery with A SIP OF WATER                        amlodipine, fluoxetine, pantoprazole.     Do not wear jewelry, make-up or nail polish, including gel polish,  artificial nails, or any other type of covering on natural nails (fingers and  toes).  Do not wear lotions, powders, or perfumes, or deodorant.  Do not shave 48 hours prior to surgery.  Men may shave face and neck.  Do not bring valuables to the hospital.  [facility] is not responsible for any belongings or valuables.  Contacts, dentures or bridgework may not be worn into surgery.  Leave your suitcase in the car.  After surgery it may be brought to your room.  For patients admitted to the hospital, discharge time will be determined by your treatment  team.  Patients discharged the day of surgery will not be allowed to drive home.    Special instructions:   DO NOT smoke tobacco or vape for 24 hours before your procedure.  Please read over the following fact sheets that you were given. Pain Booklet, Coughing and Deep Breathing, Blood Transfusion Information, MRSA Information, Surgical Site Infection Prevention, Anesthesia Post-op Instructions, and Care and Recovery After Surgery       Open Partial Colectomy, Adult, Care After The following information offers guidance on how to care for yourself after your procedure. Your health care provider may also give you more specific instructions. If you have problems or questions, contact your health care provider. What can I expect after the surgery? After the procedure, it is common to have: Pain, bruising, or swelling in your abdomen, especially in the incision areas. You will be given medicine to control the pain. Tiredness. Your energy level will return to normal over the next several weeks. Changes in your bowel movements, especially having bowel movements more often. Bloating. Nausea. Trouble urinating. Follow these instructions at home: Medicines Take over-the-counter and prescription medicines only as  told by your health care provider. Ask your health care provider if the medicine prescribed to you: Requires you to avoid driving or using machinery. Can cause constipation. You may need to take these actions to prevent or treat constipation: Drink enough fluids to keep your urine pale yellow. Take over-the-counter or prescription medicines, including stool softeners. Limit foods that are high in fat and processed sugars, such as fried or sweet foods. Eating and drinking Follow instructions from your health care provider about eating or drinking restrictions. Do not drink alcohol if your health care provider tells you not to drink. Eat a low-fiber diet for the first 4 weeks after  surgery. Most people on a low-fiber eating plan should eat less than 10 grams (g) of fiber a day. Follow recommendations from your health care provider or dietician about how much fiber you should have each day. Always check food labels to know the fiber content of packaged foods. In general, a low-fiber food will have fewer than 2 g of fiber per serving. In general, try to avoid whole grains, raw fruits and vegetables, dried fruit, tough cuts of meat, nuts, and seeds. Incision care  Follow instructions from your health care provider about how to take care of your incision. Make sure you: Wash your hands with soap and water for at least 20 seconds before and after you change your bandage (dressing). If soap and water are not available, use hand sanitizer. Change your dressing as told by your health care provider. Leave stitches (sutures), skin glue, or adhesive strips in place. These skin closures may need to stay in place for 2 weeks or longer. If adhesive strip edges start to loosen and curl up, you may trim the loose edges. Do not remove adhesive strips completely unless your health care provider tells you to do that. Check your incision area every day for signs of infection. Check for: More redness, swelling, or pain. Fluid or blood. Warmth. Pus or a bad smell. Keep your incisions clean and dry. Avoid wearing tight clothing around your incision. Do not take baths, swim, or use a hot tub until your health care provider approves. Ask your health care provider if you may take showers. You may only be allowed to take sponge baths. Activity  Rest as told by your health care provider. Avoid sitting for a long time without moving. Get up and take short walks every 1-2 hours. This is important to improve blood flow and breathing. Ask for help if you feel weak or unsteady. You may have to avoid lifting. Ask your health care provider how much you can safely lift. Do deep breathing exercises as told  by your health care provider. Place your hands or a pillow over your incision area before you cough. Return to your normal activities as told by your health care provider. Ask your health care provider what activities are safe for you. General instructions Do not use any products that contain nicotine or tobacco. These products include cigarettes, chewing tobacco, and vaping devices, such as e-cigarettes. If you need help quitting, ask your health care provider. Wear compression stockings as told by your health care provider. These stockings help to prevent blood clots and reduce swelling in your legs. Keep all follow-up visits. This is important to monitor healing and check for any complications. Contact a health care provider if: You have not had a bowel movement in 3 days after surgery. You have chills or fever. Medicine is not controlling your pain. You  have any signs of infection at your incision area. You have nausea and vomiting that will not go away. Get help right away if: You have chest pain or trouble breathing. You have a warm, tender swelling in your leg. Your incision breaks open. You have severe pain. You have bleeding from the rectum. These symptoms may be an emergency. Get help right away. Call 911. Do not wait to see if the symptoms will go away. Do not drive yourself to the hospital. Summary After your procedure, it is common to have pain and to be tired. Your incision area may also be sore and tender. Take over-the-counter and prescription medicines only as told by your health care provider. Return to your normal activities as told by your health care provider. Follow instructions from your health care provider about how to take care of your incision. Get help right away if you have chest pain or trouble breathing. This information is not intended to replace advice given to you by your health care provider. Make sure you discuss any questions you have with your health  care provider. Document Revised: 09/20/2021 Document Reviewed: 09/20/2021 Elsevier Patient Education  2024 [employer] General Anesthesia, Adult, Care After The following information offers guidance on how to care for yourself after your procedure. Your health care provider may also give you more specific instructions. If you have problems or questions, contact your health care provider. What can I expect after the procedure? After the procedure, it is common for people to: Have pain or discomfort at the IV site. Have nausea or vomiting. Have a sore throat or hoarseness. Have trouble concentrating. Feel cold or chills. Feel weak, sleepy, or tired (fatigue). Have soreness and body aches. These can affect parts of the body that were not involved in surgery. Follow these instructions at home: For the time period you were told by your health care provider:  Rest. Do not participate in activities where you could fall or become injured. Do not drive or use machinery. Do not drink alcohol. Do not take sleeping pills or medicines that cause drowsiness. Do not make important decisions or sign legal documents. Do not take care of children on your own. General instructions Drink enough fluid to keep your urine pale yellow. If you have sleep apnea, surgery and certain medicines can increase your risk for breathing problems. Follow instructions from your health care provider about wearing your sleep device: Anytime you are sleeping, including during daytime naps. While taking prescription pain medicines, sleeping medicines, or medicines that make you drowsy. Return to your normal activities as told by your health care provider. Ask your health care provider what activities are safe for you. Take over-the-counter and prescription medicines only as told by your health care provider. Do not use any products that contain nicotine or tobacco. These products include cigarettes, chewing tobacco, and  vaping devices, such as e-cigarettes. These can delay incision healing after surgery. If you need help quitting, ask your health care provider. Contact a health care provider if: You have nausea or vomiting that does not get better with medicine. You vomit every time you eat or drink. You have pain that does not get better with medicine. You cannot urinate or have bloody urine. You develop a skin rash. You have a fever. Get help right away if: You have trouble breathing. You have chest pain. You vomit blood. These symptoms may be an emergency. Get help right away. Call 911. Do not wait to see if the  symptoms will go away. Do not drive yourself to the hospital. Summary After the procedure, it is common to have a sore throat, hoarseness, nausea, vomiting, or to feel weak, sleepy, or fatigue. For the time period you were told by your health care provider, do not drive or use machinery. Get help right away if you have difficulty breathing, have chest pain, or vomit blood. These symptoms may be an emergency. This information is not intended to replace advice given to you by your health care provider. Make sure you discuss any questions you have with your health care provider. Document Revised: 09/01/2021 Document Reviewed: 09/01/2021 Elsevier Patient Education  2024 [employer] How to Use Chlorhexidine Before Surgery Chlorhexidine gluconate (CHG) is a germ-killing (antiseptic) solution that is used to clean the skin. It can get rid of the bacteria that normally live on the skin and can keep them away for about 24 hours. To clean your skin with CHG, you may be given: A CHG solution to use in the shower or as part of a sponge bath. A prepackaged cloth that contains CHG. Cleaning your skin with CHG may help lower the risk for infection: While you are staying in the intensive care unit of the hospital. If you have a vascular access, such as a central line, to provide short-term or long-term  access to your veins. If you have a catheter to drain urine from your bladder. If you are on a ventilator. A ventilator is a machine that helps you breathe by moving air in and out of your lungs. After surgery. What are the risks? Risks of using CHG include: A skin reaction. Hearing loss, if CHG gets in your ears and you have a perforated eardrum. Eye injury, if CHG gets in your eyes and is not rinsed out. The CHG product catching fire. Make sure that you avoid smoking and flames after applying CHG to your skin. Do not use CHG: If you have a chlorhexidine allergy or have previously reacted to chlorhexidine. On babies younger than 48 months of age. How to use CHG solution Use CHG only as told by your health care provider, and follow the instructions on the label. Use the full amount of CHG as directed. Usually, this is one bottle. During a shower Follow these steps when using CHG solution during a shower (unless your health care provider gives you different instructions): Start the shower. Use your normal soap and shampoo to wash your face and hair. Turn off the shower or move out of the shower stream. Pour the CHG onto a clean washcloth. Do not use any type of brush or rough-edged sponge. Starting at your neck, lather your body down to your toes. Make sure you follow these instructions: If you will be having surgery, pay special attention to the part of your body where you will be having surgery. Scrub this area for at least 1 minute. Do not use CHG on your head or face. If the solution gets into your ears or eyes, rinse them well with water. Avoid your genital area. Avoid any areas of skin that have broken skin, cuts, or scrapes. Scrub your back and under your arms. Make sure to wash skin folds. Let the lather sit on your skin for 1-2 minutes or as long as told by your health care provider. Thoroughly rinse your entire body in the shower. Make sure that all body creases and crevices are  rinsed well. Dry off with a clean towel. Do not put any  substances on your body afterward--such as powder, lotion, or perfume--unless you are told to do so by your health care provider. Only use lotions that are recommended by the manufacturer. Put on clean clothes or pajamas. If it is the night before your surgery, sleep in clean sheets.  During a sponge bath Follow these steps when using CHG solution during a sponge bath (unless your health care provider gives you different instructions): Use your normal soap and shampoo to wash your face and hair. Pour the CHG onto a clean washcloth. Starting at your neck, lather your body down to your toes. Make sure you follow these instructions: If you will be having surgery, pay special attention to the part of your body where you will be having surgery. Scrub this area for at least 1 minute. Do not use CHG on your head or face. If the solution gets into your ears or eyes, rinse them well with water. Avoid your genital area. Avoid any areas of skin that have broken skin, cuts, or scrapes. Scrub your back and under your arms. Make sure to wash skin folds. Let the lather sit on your skin for 1-2 minutes or as long as told by your health care provider. Using a different clean, wet washcloth, thoroughly rinse your entire body. Make sure that all body creases and crevices are rinsed well. Dry off with a clean towel. Do not put any substances on your body afterward--such as powder, lotion, or perfume--unless you are told to do so by your health care provider. Only use lotions that are recommended by the manufacturer. Put on clean clothes or pajamas. If it is the night before your surgery, sleep in clean sheets. How to use CHG prepackaged cloths Only use CHG cloths as told by your health care provider, and follow the instructions on the label. Use the CHG cloth on clean, dry skin. Do not use the CHG cloth on your head or face unless your health care provider  tells you to. When washing with the CHG cloth: Avoid your genital area. Avoid any areas of skin that have broken skin, cuts, or scrapes. Before surgery Follow these steps when using a CHG cloth to clean before surgery (unless your health care provider gives you different instructions): Using the CHG cloth, vigorously scrub the part of your body where you will be having surgery. Scrub using a back-and-forth motion for 3 minutes. The area on your body should be completely wet with CHG when you are done scrubbing. Do not rinse. Discard the cloth and let the area air-dry. Do not put any substances on the area afterward, such as powder, lotion, or perfume. Put on clean clothes or pajamas. If it is the night before your surgery, sleep in clean sheets.  For general bathing Follow these steps when using CHG cloths for general bathing (unless your health care provider gives you different instructions). Use a separate CHG cloth for each area of your body. Make sure you wash between any folds of skin and between your fingers and toes. Wash your body in the following order, switching to a new cloth after each step: The front of your neck, shoulders, and chest. Both of your arms, under your arms, and your hands. Your stomach and groin area, avoiding the genitals. Your right leg and foot. Your left leg and foot. The back of your neck, your back, and your buttocks. Do not rinse. Discard the cloth and let the area air-dry. Do not put any substances on your  body afterward--such as powder, lotion, or perfume--unless you are told to do so by your health care provider. Only use lotions that are recommended by the manufacturer. Put on clean clothes or pajamas. Contact a health care provider if: Your skin gets irritated after scrubbing. You have questions about using your solution or cloth. You swallow any chlorhexidine. Call your local poison control center (951-770-8093 in the [place] Get help right away  if: Your eyes itch badly, or they become very red or swollen. Your skin itches badly and is red or swollen. Your hearing changes. You have trouble seeing. You have swelling or tingling in your mouth or throat. You have trouble breathing. These symptoms may represent a serious problem that is an emergency. Do not wait to see if the symptoms will go away. Get medical help right away. Call your local emergency services (911 in the [place] Do not drive yourself to the hospital. Summary Chlorhexidine gluconate (CHG) is a germ-killing (antiseptic) solution that is used to clean the skin. Cleaning your skin with CHG may help to lower your risk for infection. You may be given CHG to use for bathing. It may be in a bottle or in a prepackaged cloth to use on your skin. Carefully follow your health care provider's instructions and the instructions on the product label. Do not use CHG if you have a chlorhexidine allergy. Contact your health care provider if your skin gets irritated after scrubbing. This information is not intended to replace advice given to you by your health care provider. Make sure you discuss any questions you have with your health care provider. Document Revised: 10/02/2021 Document Reviewed: 08/15/2020 Elsevier Patient Education  2023 [employer].

## 2023-06-17 DIAGNOSIS — L299 Pruritus, unspecified: Secondary | ICD-10-CM | POA: Diagnosis not present

## 2023-06-17 DIAGNOSIS — J3081 Allergic rhinitis due to animal (cat) (dog) hair and dander: Secondary | ICD-10-CM | POA: Diagnosis not present

## 2023-06-17 DIAGNOSIS — J3089 Other allergic rhinitis: Secondary | ICD-10-CM | POA: Diagnosis not present

## 2023-06-17 DIAGNOSIS — J301 Allergic rhinitis due to pollen: Secondary | ICD-10-CM | POA: Diagnosis not present

## 2023-06-18 ENCOUNTER — Encounter (HOSPITAL_COMMUNITY): Payer: Self-pay

## 2023-06-18 ENCOUNTER — Encounter (HOSPITAL_COMMUNITY)
Admission: RE | Admit: 2023-06-18 | Discharge: 2023-06-18 | Disposition: A | Discharge status: Home / Self Care | Payer: Medicare HMO | Source: Ambulatory Visit | Attending: General Surgery | Admitting: General Surgery

## 2023-06-18 ENCOUNTER — Other Ambulatory Visit: Payer: Self-pay

## 2023-06-18 DIAGNOSIS — Z01812 Encounter for preprocedural laboratory examination: Secondary | ICD-10-CM | POA: Insufficient documentation

## 2023-06-18 DIAGNOSIS — K5140 Inflammatory polyps of colon without complications: Secondary | ICD-10-CM | POA: Diagnosis not present

## 2023-06-18 DIAGNOSIS — Z888 Allergy status to other drugs, medicaments and biological substances status: Secondary | ICD-10-CM | POA: Diagnosis not present

## 2023-06-18 DIAGNOSIS — F419 Anxiety disorder, unspecified: Secondary | ICD-10-CM | POA: Diagnosis present

## 2023-06-18 DIAGNOSIS — R7309 Other abnormal glucose: Secondary | ICD-10-CM | POA: Diagnosis not present

## 2023-06-18 DIAGNOSIS — E871 Hypo-osmolality and hyponatremia: Secondary | ICD-10-CM | POA: Diagnosis not present

## 2023-06-18 DIAGNOSIS — K5720 Diverticulitis of large intestine with perforation and abscess without bleeding: Secondary | ICD-10-CM | POA: Diagnosis present

## 2023-06-18 DIAGNOSIS — Z91030 Bee allergy status: Secondary | ICD-10-CM | POA: Diagnosis not present

## 2023-06-18 DIAGNOSIS — Z87440 Personal history of urinary (tract) infections: Secondary | ICD-10-CM | POA: Diagnosis not present

## 2023-06-18 DIAGNOSIS — R1084 Generalized abdominal pain: Secondary | ICD-10-CM | POA: Diagnosis not present

## 2023-06-18 DIAGNOSIS — I10 Essential (primary) hypertension: Secondary | ICD-10-CM | POA: Diagnosis present

## 2023-06-18 DIAGNOSIS — Z8711 Personal history of peptic ulcer disease: Secondary | ICD-10-CM | POA: Diagnosis not present

## 2023-06-18 DIAGNOSIS — H353211 Exudative age-related macular degeneration, right eye, with active choroidal neovascularization: Secondary | ICD-10-CM | POA: Diagnosis not present

## 2023-06-18 DIAGNOSIS — R109 Unspecified abdominal pain: Secondary | ICD-10-CM | POA: Diagnosis present

## 2023-06-18 DIAGNOSIS — E7800 Pure hypercholesterolemia, unspecified: Secondary | ICD-10-CM | POA: Diagnosis present

## 2023-06-18 DIAGNOSIS — N321 Vesicointestinal fistula: Secondary | ICD-10-CM | POA: Diagnosis present

## 2023-06-18 DIAGNOSIS — D638 Anemia in other chronic diseases classified elsewhere: Secondary | ICD-10-CM | POA: Diagnosis present

## 2023-06-18 DIAGNOSIS — Z79899 Other long term (current) drug therapy: Secondary | ICD-10-CM | POA: Insufficient documentation

## 2023-06-18 DIAGNOSIS — Z947 Corneal transplant status: Secondary | ICD-10-CM | POA: Diagnosis not present

## 2023-06-18 DIAGNOSIS — D649 Anemia, unspecified: Secondary | ICD-10-CM | POA: Diagnosis not present

## 2023-06-18 DIAGNOSIS — M797 Fibromyalgia: Secondary | ICD-10-CM | POA: Diagnosis present

## 2023-06-18 DIAGNOSIS — K633 Ulcer of intestine: Secondary | ICD-10-CM | POA: Diagnosis not present

## 2023-06-18 DIAGNOSIS — Z8249 Family history of ischemic heart disease and other diseases of the circulatory system: Secondary | ICD-10-CM | POA: Diagnosis not present

## 2023-06-18 DIAGNOSIS — F32A Depression, unspecified: Secondary | ICD-10-CM | POA: Diagnosis present

## 2023-06-18 DIAGNOSIS — Z9049 Acquired absence of other specified parts of digestive tract: Secondary | ICD-10-CM | POA: Diagnosis not present

## 2023-06-18 DIAGNOSIS — Z01818 Encounter for other preprocedural examination: Secondary | ICD-10-CM

## 2023-06-18 HISTORY — DX: Sleep apnea, unspecified: G47.30

## 2023-06-18 LAB — CBC WITH DIFFERENTIAL/PLATELET
Abs Immature Granulocytes: 0.02 10*3/uL (ref 0.00–0.07)
Basophils Absolute: 0.1 10*3/uL (ref 0.0–0.1)
Basophils Relative: 1 %
Eosinophils Absolute: 0.1 10*3/uL (ref 0.0–0.5)
Eosinophils Relative: 2 %
HCT: 34.6 % — ABNORMAL LOW (ref 39.0–52.0)
Hemoglobin: 10.7 g/dL — ABNORMAL LOW (ref 13.0–17.0)
Immature Granulocytes: 0 %
Lymphocytes Relative: 12 %
Lymphs Abs: 0.9 10*3/uL (ref 0.7–4.0)
MCH: 26 pg (ref 26.0–34.0)
MCHC: 30.9 g/dL (ref 30.0–36.0)
MCV: 84.2 fL (ref 80.0–100.0)
Monocytes Absolute: 0.7 10*3/uL (ref 0.1–1.0)
Monocytes Relative: 8 %
Neutro Abs: 6.1 10*3/uL (ref 1.7–7.7)
Neutrophils Relative %: 77 %
Platelets: 218 10*3/uL (ref 150–400)
RBC: 4.11 MIL/uL — ABNORMAL LOW (ref 4.22–5.81)
RDW: 25.4 % — ABNORMAL HIGH (ref 11.5–15.5)
Smear Review: ADEQUATE
WBC: 7.9 10*3/uL (ref 4.0–10.5)
nRBC: 0 % (ref 0.0–0.2)

## 2023-06-18 LAB — BASIC METABOLIC PANEL
Anion gap: 6 (ref 5–15)
BUN: 24 mg/dL — ABNORMAL HIGH (ref 8–23)
CO2: 22 mmol/L (ref 22–32)
Calcium: 8.8 mg/dL — ABNORMAL LOW (ref 8.9–10.3)
Chloride: 103 mmol/L (ref 98–111)
Creatinine, Ser: 0.92 mg/dL (ref 0.61–1.24)
GFR, Estimated: >60 mL/min (ref >60)
Glucose, Bld: 105 mg/dL — ABNORMAL HIGH (ref 70–99)
Potassium: 3.8 mmol/L (ref 3.5–5.1)
Sodium: 131 mmol/L — ABNORMAL LOW (ref 135–145)

## 2023-06-18 LAB — TYPE AND SCREEN
ABO/RH(D): O POS
Antibody Screen: NEGATIVE

## 2023-06-21 ENCOUNTER — Encounter (HOSPITAL_COMMUNITY)
Admission: RE | Disposition: A | Discharge status: Home / Self Care | Payer: Self-pay | Source: Home / Self Care | Attending: General Surgery

## 2023-06-21 ENCOUNTER — Inpatient Hospital Stay (HOSPITAL_COMMUNITY): Payer: Medicare HMO | Admitting: Certified Registered"

## 2023-06-21 ENCOUNTER — Other Ambulatory Visit: Payer: Self-pay

## 2023-06-21 ENCOUNTER — Encounter (HOSPITAL_COMMUNITY): Payer: Self-pay | Admitting: General Surgery

## 2023-06-21 ENCOUNTER — Inpatient Hospital Stay (HOSPITAL_COMMUNITY)
Admission: RE | Admit: 2023-06-21 | Discharge: 2023-06-24 | DRG: 654 | Disposition: A | Discharge status: Home / Self Care | Payer: Medicare HMO | Attending: General Surgery | Admitting: General Surgery

## 2023-06-21 DIAGNOSIS — K5720 Diverticulitis of large intestine with perforation and abscess without bleeding: Secondary | ICD-10-CM | POA: Diagnosis present

## 2023-06-21 DIAGNOSIS — E871 Hypo-osmolality and hyponatremia: Secondary | ICD-10-CM | POA: Diagnosis not present

## 2023-06-21 DIAGNOSIS — Z947 Corneal transplant status: Secondary | ICD-10-CM | POA: Diagnosis not present

## 2023-06-21 DIAGNOSIS — M797 Fibromyalgia: Secondary | ICD-10-CM | POA: Diagnosis present

## 2023-06-21 DIAGNOSIS — R7309 Other abnormal glucose: Secondary | ICD-10-CM | POA: Diagnosis not present

## 2023-06-21 DIAGNOSIS — K633 Ulcer of intestine: Secondary | ICD-10-CM | POA: Diagnosis not present

## 2023-06-21 DIAGNOSIS — Z8711 Personal history of peptic ulcer disease: Secondary | ICD-10-CM | POA: Diagnosis not present

## 2023-06-21 DIAGNOSIS — Z888 Allergy status to other drugs, medicaments and biological substances status: Secondary | ICD-10-CM

## 2023-06-21 DIAGNOSIS — Z91030 Bee allergy status: Secondary | ICD-10-CM

## 2023-06-21 DIAGNOSIS — Z87440 Personal history of urinary (tract) infections: Secondary | ICD-10-CM

## 2023-06-21 DIAGNOSIS — R109 Unspecified abdominal pain: Secondary | ICD-10-CM | POA: Diagnosis present

## 2023-06-21 DIAGNOSIS — F419 Anxiety disorder, unspecified: Secondary | ICD-10-CM | POA: Diagnosis present

## 2023-06-21 DIAGNOSIS — K5140 Inflammatory polyps of colon without complications: Secondary | ICD-10-CM | POA: Diagnosis not present

## 2023-06-21 DIAGNOSIS — N321 Vesicointestinal fistula: Principal | ICD-10-CM

## 2023-06-21 DIAGNOSIS — I10 Essential (primary) hypertension: Secondary | ICD-10-CM | POA: Diagnosis present

## 2023-06-21 DIAGNOSIS — E7800 Pure hypercholesterolemia, unspecified: Secondary | ICD-10-CM | POA: Diagnosis present

## 2023-06-21 DIAGNOSIS — Z9049 Acquired absence of other specified parts of digestive tract: Secondary | ICD-10-CM

## 2023-06-21 DIAGNOSIS — F32A Depression, unspecified: Secondary | ICD-10-CM | POA: Diagnosis present

## 2023-06-21 DIAGNOSIS — Z8249 Family history of ischemic heart disease and other diseases of the circulatory system: Secondary | ICD-10-CM

## 2023-06-21 DIAGNOSIS — Z01818 Encounter for other preprocedural examination: Principal | ICD-10-CM

## 2023-06-21 DIAGNOSIS — D638 Anemia in other chronic diseases classified elsewhere: Secondary | ICD-10-CM | POA: Diagnosis present

## 2023-06-21 DIAGNOSIS — Z79899 Other long term (current) drug therapy: Secondary | ICD-10-CM | POA: Diagnosis not present

## 2023-06-21 DIAGNOSIS — R1084 Generalized abdominal pain: Secondary | ICD-10-CM | POA: Diagnosis not present

## 2023-06-21 DIAGNOSIS — D649 Anemia, unspecified: Secondary | ICD-10-CM | POA: Diagnosis not present

## 2023-06-21 HISTORY — PX: PARTIAL COLECTOMY: SHX5273

## 2023-06-21 SURGERY — COLECTOMY, PARTIAL
Anesthesia: General | Site: Abdomen

## 2023-06-21 MED ORDER — ONDANSETRON 4 MG PO TBDP: 4.0000 mg | ORAL_TABLET | Freq: Four times a day (QID) | ORAL | Status: DC | PRN

## 2023-06-21 MED ORDER — LIDOCAINE HCL (PF) 2 % IJ SOLN
INTRAMUSCULAR | Status: AC
  Filled 2023-06-21: qty 5

## 2023-06-21 MED ORDER — FENTANYL CITRATE PF 50 MCG/ML IJ SOSY
25.0000 ug | PREFILLED_SYRINGE | INTRAMUSCULAR | Status: DC | PRN
  Administered 2023-06-21 (×3): 50 ug via INTRAVENOUS
  Filled 2023-06-21 (×3): qty 1

## 2023-06-21 MED ORDER — HYDROMORPHONE HCL 1 MG/ML IJ SOLN: 0.5000 mg | INTRAMUSCULAR | Status: DC | PRN

## 2023-06-21 MED ORDER — SODIUM CHLORIDE 0.9 % IV SOLN
INTRAVENOUS | Status: AC
  Filled 2023-06-21: qty 2

## 2023-06-21 MED ORDER — MONTELUKAST SODIUM 10 MG PO TABS
10.0000 mg | ORAL_TABLET | Freq: Every day | ORAL | Status: DC
  Administered 2023-06-21 – 2023-06-23 (×3): 10 mg via ORAL
  Filled 2023-06-21 (×3): qty 1

## 2023-06-21 MED ORDER — FENTANYL CITRATE (PF) 100 MCG/2ML IJ SOLN
INTRAMUSCULAR | Status: DC | PRN
  Administered 2023-06-21: 100 ug via INTRAVENOUS
  Administered 2023-06-21 (×2): 50 ug via INTRAVENOUS

## 2023-06-21 MED ORDER — ALVIMOPAN 12 MG PO CAPS
12.0000 mg | ORAL_CAPSULE | Freq: Two times a day (BID) | ORAL | Status: DC
  Administered 2023-06-22 – 2023-06-24 (×5): 12 mg via ORAL
  Filled 2023-06-21 (×5): qty 1

## 2023-06-21 MED ORDER — ALVIMOPAN 12 MG PO CAPS: 12.0000 mg | ORAL_CAPSULE | ORAL | Status: AC

## 2023-06-21 MED ORDER — FUROSEMIDE 20 MG PO TABS
20.0000 mg | ORAL_TABLET | ORAL | Status: DC
  Administered 2023-06-22 – 2023-06-24 (×2): 20 mg via ORAL
  Filled 2023-06-21 (×3): qty 1

## 2023-06-21 MED ORDER — OXYCODONE HCL 5 MG/5ML PO SOLN: 5.0000 mg | Freq: Once | ORAL | Status: DC | PRN

## 2023-06-21 MED ORDER — ONDANSETRON HCL 4 MG/2ML IJ SOLN: 4.0000 mg | Freq: Four times a day (QID) | INTRAMUSCULAR | Status: DC | PRN

## 2023-06-21 MED ORDER — ORAL CARE MOUTH RINSE: 15.0000 mL | Freq: Once | OROMUCOSAL | Status: DC

## 2023-06-21 MED ORDER — FENTANYL CITRATE (PF) 100 MCG/2ML IJ SOLN
INTRAMUSCULAR | Status: AC
  Filled 2023-06-21: qty 2

## 2023-06-21 MED ORDER — AMLODIPINE BESYLATE 5 MG PO TABS
5.0000 mg | ORAL_TABLET | Freq: Every day | ORAL | Status: DC
  Administered 2023-06-21 – 2023-06-24 (×4): 5 mg via ORAL
  Filled 2023-06-21 (×4): qty 1

## 2023-06-21 MED ORDER — FLUTICASONE PROPIONATE 50 MCG/ACT NA SUSP
1.0000 | Freq: Every day | NASAL | Status: DC
  Administered 2023-06-23 – 2023-06-24 (×2): 1 via NASAL
  Filled 2023-06-21: qty 16

## 2023-06-21 MED ORDER — ENOXAPARIN SODIUM 40 MG/0.4ML IJ SOSY
PREFILLED_SYRINGE | INTRAMUSCULAR | Status: AC
  Administered 2023-06-21: 40 mg via SUBCUTANEOUS
  Filled 2023-06-21: qty 0.4

## 2023-06-21 MED ORDER — ACETAMINOPHEN 650 MG RE SUPP: 650.0000 mg | Freq: Four times a day (QID) | RECTAL | Status: DC | PRN

## 2023-06-21 MED ORDER — HYDROMORPHONE HCL 1 MG/ML IJ SOLN
0.5000 mg | INTRAMUSCULAR | Status: DC | PRN
Start: 2023-06-21 — End: 2023-06-21
  Administered 2023-06-21 (×3): 0.5 mg via INTRAVENOUS
  Filled 2023-06-21 (×3): qty 0.5

## 2023-06-21 MED ORDER — ONDANSETRON HCL 4 MG/2ML IJ SOLN
INTRAMUSCULAR | Status: DC | PRN
  Administered 2023-06-21: 4 mg via INTRAVENOUS

## 2023-06-21 MED ORDER — TRIAZOLAM 0.25 MG PO TABS: 0.2500 mg | ORAL_TABLET | Freq: Every day | ORAL | Status: DC

## 2023-06-21 MED ORDER — PROPOFOL 10 MG/ML IV BOLUS
INTRAVENOUS | Status: AC
  Filled 2023-06-21: qty 20

## 2023-06-21 MED ORDER — HYDROCODONE-ACETAMINOPHEN 5-325 MG PO TABS
1.0000 | ORAL_TABLET | ORAL | Status: DC | PRN
  Administered 2023-06-22: 1 via ORAL
  Filled 2023-06-21: qty 1

## 2023-06-21 MED ORDER — FLUTICASONE PROPIONATE 50 MCG/ACT NA SUSP: 1.0000 | Freq: Every day | NASAL | Status: DC | PRN

## 2023-06-21 MED ORDER — ENOXAPARIN SODIUM 40 MG/0.4ML IJ SOSY
40.0000 mg | PREFILLED_SYRINGE | INTRAMUSCULAR | Status: DC
  Administered 2023-06-22 – 2023-06-24 (×3): 40 mg via SUBCUTANEOUS
  Filled 2023-06-21 (×4): qty 0.4

## 2023-06-21 MED ORDER — DEXAMETHASONE SODIUM PHOSPHATE 10 MG/ML IJ SOLN
INTRAMUSCULAR | Status: DC | PRN
  Administered 2023-06-21: 6 mg via INTRAVENOUS

## 2023-06-21 MED ORDER — PHENYLEPHRINE HCL-NACL 20-0.9 MG/250ML-% IV SOLN
INTRAVENOUS | Status: AC
  Filled 2023-06-21: qty 250

## 2023-06-21 MED ORDER — CHLORHEXIDINE GLUCONATE CLOTH 2 % EX PADS
6.0000 | MEDICATED_PAD | Freq: Every day | CUTANEOUS | Status: DC
  Administered 2023-06-21 – 2023-06-24 (×4): 6 via TOPICAL

## 2023-06-21 MED ORDER — ALVIMOPAN 12 MG PO CAPS
ORAL_CAPSULE | ORAL | Status: AC
  Administered 2023-06-21: 12 mg via ORAL
  Filled 2023-06-21: qty 1

## 2023-06-21 MED ORDER — HYDROMORPHONE HCL 1 MG/ML IJ SOLN
INTRAMUSCULAR | Status: AC
  Filled 2023-06-21: qty 1

## 2023-06-21 MED ORDER — ATENOLOL 25 MG PO TABS
12.5000 mg | ORAL_TABLET | Freq: Every evening | ORAL | Status: DC
  Administered 2023-06-21 – 2023-06-23 (×3): 12.5 mg via ORAL
  Filled 2023-06-21 (×3): qty 1

## 2023-06-21 MED ORDER — FLUOXETINE HCL 10 MG PO CAPS
10.0000 mg | ORAL_CAPSULE | Freq: Every day | ORAL | Status: DC
  Administered 2023-06-21 – 2023-06-24 (×4): 10 mg via ORAL
  Filled 2023-06-21 (×4): qty 1

## 2023-06-21 MED ORDER — ONDANSETRON HCL 4 MG/2ML IJ SOLN
INTRAMUSCULAR | Status: AC
  Filled 2023-06-21: qty 2

## 2023-06-21 MED ORDER — ENOXAPARIN SODIUM 40 MG/0.4ML IJ SOSY: 40.0000 mg | PREFILLED_SYRINGE | Freq: Once | INTRAMUSCULAR | Status: AC

## 2023-06-21 MED ORDER — LEVOCETIRIZINE DIHYDROCHLORIDE 5 MG PO TABS: 5.0000 mg | ORAL_TABLET | Freq: Every evening | ORAL | Status: DC

## 2023-06-21 MED ORDER — DIPHENHYDRAMINE HCL 50 MG/ML IJ SOLN
12.5000 mg | Freq: Once | INTRAMUSCULAR | Status: AC
  Administered 2023-06-21: 12.5 mg via INTRAVENOUS
  Filled 2023-06-21: qty 1

## 2023-06-21 MED ORDER — CHLORHEXIDINE GLUCONATE 0.12 % MT SOLN: 15.0000 mL | Freq: Once | OROMUCOSAL | Status: DC

## 2023-06-21 MED ORDER — PHENYLEPHRINE 80 MCG/ML (10ML) SYRINGE FOR IV PUSH (FOR BLOOD PRESSURE SUPPORT)
PREFILLED_SYRINGE | INTRAVENOUS | Status: DC | PRN
  Administered 2023-06-21 (×2): 160 ug via INTRAVENOUS

## 2023-06-21 MED ORDER — ROCURONIUM BROMIDE 10 MG/ML (PF) SYRINGE
PREFILLED_SYRINGE | INTRAVENOUS | Status: AC
  Filled 2023-06-21: qty 10

## 2023-06-21 MED ORDER — SODIUM CHLORIDE 0.9 % IV SOLN
2.0000 g | INTRAVENOUS | Status: AC
  Administered 2023-06-21: 2 g via INTRAVENOUS

## 2023-06-21 MED ORDER — PHENYLEPHRINE 80 MCG/ML (10ML) SYRINGE FOR IV PUSH (FOR BLOOD PRESSURE SUPPORT)
PREFILLED_SYRINGE | INTRAVENOUS | Status: AC
  Filled 2023-06-21: qty 10

## 2023-06-21 MED ORDER — BUPIVACAINE HCL (PF) 0.5 % IJ SOLN
INTRAMUSCULAR | Status: DC | PRN
  Administered 2023-06-21: 30 mL

## 2023-06-21 MED ORDER — MIDAZOLAM HCL 2 MG/2ML IJ SOLN
INTRAMUSCULAR | Status: AC
  Filled 2023-06-21: qty 2

## 2023-06-21 MED ORDER — STERILE WATER FOR IRRIGATION IR SOLN
Status: DC | PRN
  Administered 2023-06-21: 500 mL via INTRAVESICAL

## 2023-06-21 MED ORDER — LACTATED RINGERS IV SOLN: INTRAVENOUS | Status: DC

## 2023-06-21 MED ORDER — ROCURONIUM BROMIDE 10 MG/ML (PF) SYRINGE
PREFILLED_SYRINGE | INTRAVENOUS | Status: DC | PRN
  Administered 2023-06-21: 70 mg via INTRAVENOUS
  Administered 2023-06-21: 30 mg via INTRAVENOUS

## 2023-06-21 MED ORDER — ONDANSETRON HCL 4 MG/2ML IJ SOLN: 4.0000 mg | Freq: Once | INTRAMUSCULAR | Status: DC | PRN

## 2023-06-21 MED ORDER — PROPOFOL 10 MG/ML IV BOLUS
INTRAVENOUS | Status: DC | PRN
  Administered 2023-06-21: 150 mg via INTRAVENOUS

## 2023-06-21 MED ORDER — 0.9 % SODIUM CHLORIDE (POUR BTL) OPTIME
TOPICAL | Status: DC | PRN
  Administered 2023-06-21 (×3): 1000 mL

## 2023-06-21 MED ORDER — DEXAMETHASONE SODIUM PHOSPHATE 10 MG/ML IJ SOLN
INTRAMUSCULAR | Status: AC
  Filled 2023-06-21: qty 1

## 2023-06-21 MED ORDER — BUPIVACAINE HCL (PF) 0.5 % IJ SOLN
INTRAMUSCULAR | Status: AC
  Filled 2023-06-21: qty 30

## 2023-06-21 MED ORDER — DIPHENHYDRAMINE HCL 50 MG/ML IJ SOLN: 12.5000 mg | Freq: Four times a day (QID) | INTRAMUSCULAR | Status: DC | PRN

## 2023-06-21 MED ORDER — DIPHENHYDRAMINE HCL 12.5 MG/5ML PO ELIX
12.5000 mg | ORAL_SOLUTION | Freq: Four times a day (QID) | ORAL | Status: DC | PRN
  Administered 2023-06-23: 12.5 mg via ORAL
  Filled 2023-06-21: qty 5

## 2023-06-21 MED ORDER — OXYCODONE HCL 5 MG PO TABS: 5.0000 mg | ORAL_TABLET | Freq: Once | ORAL | Status: DC | PRN

## 2023-06-21 MED ORDER — ACETAMINOPHEN 325 MG PO TABS
650.0000 mg | ORAL_TABLET | Freq: Four times a day (QID) | ORAL | Status: DC | PRN
  Filled 2023-06-21: qty 2

## 2023-06-21 MED ORDER — PANTOPRAZOLE SODIUM 40 MG PO TBEC
40.0000 mg | DELAYED_RELEASE_TABLET | Freq: Every day | ORAL | Status: DC
Start: 2023-06-21 — End: 2023-06-24
  Administered 2023-06-21 – 2023-06-24 (×4): 40 mg via ORAL
  Filled 2023-06-21 (×4): qty 1

## 2023-06-21 MED ORDER — CHLORHEXIDINE GLUCONATE CLOTH 2 % EX PADS: 6.0000 | MEDICATED_PAD | Freq: Once | CUTANEOUS | Status: DC

## 2023-06-21 MED ORDER — CHLORHEXIDINE GLUCONATE CLOTH 2 % EX PADS
6.0000 | MEDICATED_PAD | Freq: Once | CUTANEOUS | Status: AC
  Administered 2023-06-21: 6 via TOPICAL

## 2023-06-21 MED ORDER — AZELASTINE HCL 0.1 % NA SOLN
1.0000 | Freq: Two times a day (BID) | NASAL | Status: DC
  Administered 2023-06-22 – 2023-06-24 (×4): 1 via NASAL
  Filled 2023-06-21: qty 30

## 2023-06-21 MED ORDER — LORATADINE 10 MG PO TABS
10.0000 mg | ORAL_TABLET | Freq: Every day | ORAL | Status: DC
  Administered 2023-06-21 – 2023-06-24 (×4): 10 mg via ORAL
  Filled 2023-06-21 (×4): qty 1

## 2023-06-21 MED ORDER — TEMAZEPAM 7.5 MG PO CAPS
7.5000 mg | ORAL_CAPSULE | Freq: Every day | ORAL | Status: DC
  Administered 2023-06-21 – 2023-06-23 (×3): 7.5 mg via ORAL
  Filled 2023-06-21 (×3): qty 1

## 2023-06-21 MED ORDER — LACTATED RINGERS IV SOLN: INTRAVENOUS | Status: DC | PRN

## 2023-06-21 MED ORDER — SODIUM CHLORIDE 0.9 % IV SOLN
INTRAVENOUS | Status: DC
Start: 2023-06-21 — End: 2023-06-22

## 2023-06-21 MED ORDER — LIDOCAINE HCL (CARDIAC) PF 100 MG/5ML IV SOSY
PREFILLED_SYRINGE | INTRAVENOUS | Status: DC | PRN
  Administered 2023-06-21: 100 mg via INTRAVENOUS

## 2023-06-21 MED ORDER — SUGAMMADEX SODIUM 200 MG/2ML IV SOLN
INTRAVENOUS | Status: DC | PRN
  Administered 2023-06-21: 350 mg via INTRAVENOUS

## 2023-06-21 MED ORDER — FOLIC ACID 1 MG PO TABS
2.0000 mg | ORAL_TABLET | Freq: Every day | ORAL | Status: DC
  Administered 2023-06-22 – 2023-06-24 (×3): 2 mg via ORAL
  Filled 2023-06-21 (×3): qty 2

## 2023-06-21 SURGICAL SUPPLY — 35 items
COVER LIGHT HANDLE STERIS (MISCELLANEOUS) ×4 IMPLANT
DRSG OPSITE POSTOP 4X8 (GAUZE/BANDAGES/DRESSINGS) IMPLANT
ELECT REM PT RETURN 9FT ADLT (ELECTROSURGICAL) ×1 IMPLANT
ELECTRODE REM PT RTRN 9FT ADLT (ELECTROSURGICAL) ×1 IMPLANT
GLOVE BIOGEL PI IND STRL 7.0 (GLOVE) ×5 IMPLANT
GLOVE SURG SS PI 7.5 STRL IVOR (GLOVE) ×3 IMPLANT
GOWN STRL REUS W/TWL LRG LVL3 (GOWN DISPOSABLE) ×6 IMPLANT
INST SET MAJOR GENERAL (KITS) ×1 IMPLANT
KIT TURNOVER KIT A (KITS) ×1 IMPLANT
LIGASURE IMPACT 36 18CM CVD LR (INSTRUMENTS) ×1 IMPLANT
MANIFOLD NEPTUNE II (INSTRUMENTS) ×1 IMPLANT
NDL HYPO 18GX1.5 BLUNT FILL (NEEDLE) ×1 IMPLANT
NDL HYPO 21X1.5 SAFETY (NEEDLE) ×1 IMPLANT
NEEDLE HYPO 18GX1.5 BLUNT FILL (NEEDLE) ×1 IMPLANT
NEEDLE HYPO 21X1.5 SAFETY (NEEDLE) ×1 IMPLANT
NS IRRIG 1000ML POUR BTL (IV SOLUTION) ×2 IMPLANT
PACK COLON (CUSTOM PROCEDURE TRAY) ×1 IMPLANT
PAD ARMBOARD 7.5X6 YLW CONV (MISCELLANEOUS) ×1 IMPLANT
PENCIL HANDSWITCHING (ELECTRODE) ×1 IMPLANT
POSITIONER HEAD 8X9X4 ADT (SOFTGOODS) ×1 IMPLANT
RELOAD PROXIMATE 75MM BLUE (ENDOMECHANICALS) ×2 IMPLANT
RELOAD STAPLE 75 3.8 BLU REG (ENDOMECHANICALS) IMPLANT
RETRACTOR WND ALEXIS-O 25 LRG (MISCELLANEOUS) IMPLANT
RTRCTR WOUND ALEXIS O 25CM LRG (MISCELLANEOUS) ×1 IMPLANT
SHEET LAVH (DRAPES) IMPLANT
SPONGE INTESTINAL PEANUT (DISPOSABLE) IMPLANT
SPONGE T-LAP 18X18 ~~LOC~~+RFID (SPONGE) ×2 IMPLANT
STAPLER GUN LINEAR PROX 60 (STAPLE) ×1 IMPLANT
STAPLER PROXIMATE 75MM BLUE (STAPLE) IMPLANT
STAPLER VISISTAT (STAPLE) ×1 IMPLANT
SUT PDS AB 0 CTX 60 (SUTURE) IMPLANT
SUT SILK 3 0 SH CR/8 (SUTURE) ×1 IMPLANT
SUT VIC AB 2-0 UR6 27 (SUTURE) IMPLANT
TRAY FOLEY MTR SLVR 16FR STAT (SET/KITS/TRAYS/PACK) ×1 IMPLANT
YANKAUER SUCT BULB TIP 10FT TU (MISCELLANEOUS) ×1 IMPLANT

## 2023-06-21 NOTE — Anesthesia Procedure Notes (Signed)
 Procedure Name: Intubation Date/Time: 06/21/2023 9:53 AM  Performed by: Para Jerelene CROME, CRNAPre-anesthesia Checklist: Patient identified, Emergency Drugs available, Suction available and Patient being monitored Patient Re-evaluated:Patient Re-evaluated prior to induction Oxygen  Delivery Method: Circle system utilized Preoxygenation: Pre-oxygenation with 100% oxygen  Induction Type: IV induction Ventilation: Mask ventilation without difficulty Laryngoscope Size: Mac and 4 Grade View: Grade II Tube type: Oral Tube size: 7.5 mm Number of attempts: 1 Airway Equipment and Method: Stylet Placement Confirmation: positive ETCO2, CO2 detector, breath sounds checked- equal and bilateral and ETT inserted through vocal cords under direct vision Secured at: 23 cm Tube secured with: Tape Dental Injury: Teeth and Oropharynx as per pre-operative assessment  Comments: Atraumatic intubation. Lips and teeth remain in preoperative condition.

## 2023-06-21 NOTE — Progress Notes (Deleted)
 Name: Cole Brown DOB: February 16, 1946 MRN: [id number]  History of Present Illness: Mr. Mcpheeters is a 78 y.o. male who presents today as a new patient at [facility]. All available relevant medical records have been reviewed. He is accompanied by his ***wife, who assists with providing history due to patient's mild dementia. - GU History: 1. 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   2. Recurrent UTIs. Secondary to #1 3. Kidney stone(s). No stones seen on CT stone study done 06/01/2023.  Urine culture results in past 12 months: - 02/12/2023: Positive for Enterococcus faecalis - 03/10/2023: Positive for Klebsiella pneumoniae & Enterococcus faecalis - 05/10/2023: Positive for Klebsiella pneumoniae & E. Coli  - 05/24/2023: Positive for Klebsiella pneumoniae  Recent history:  > 06/21/2023: Underwent ***partial colectomy and ***repair of colovesical fistula by Dr. Mavis.  > ***cystogram?  Today: He reports the catheter is draining ***.  He {Actions; denies-reports:120008} gross hematuria.  He {Actions; denies-reports:120008} flank pain or abdominal pain. He {Actions; denies-reports:120008} fevers, nausea, or vomiting.   Fall Screening: Do you usually have a device to assist in your mobility? {yes/no:20286} ***cane / ***walker / ***wheelchair  Medications: No current facility-administered medications for this visit.   No current outpatient medications on file.   Facility-Administered Medications Ordered in Other Visits  Medication Dose Route Frequency Provider Last Rate Last Admin   0.9 % irrigation (POUR BTL)    PRN Mavis Anes, MD   1,000 mL at 06/21/23 1019   Chlorhexidine  Gluconate Cloth 2 % PADS 6 each  6 each Topical Once Mavis Anes, MD       dexamethasone  (DECADRON ) injection   Intravenous Anesthesia Intra-op Stanislaus, Belinda L, CRNA   6 mg at 06/21/23 1045   fentaNYL  (SUBLIMAZE ) injection   Intravenous Anesthesia Intra-op Stanislaus, Belinda L,  CRNA   50 mcg at 06/21/23 1111   lactated ringers  infusion   Intravenous Continuous PRN Stanislaus, Belinda L, CRNA   New Bag at 06/21/23 1114   lidocaine  (cardiac) 100 mg/75mL (XYLOCAINE ) injection 2%   Intravenous Anesthesia Intra-op Stanislaus, Belinda L, CRNA   100 mg at 06/21/23 0949   PHENYLephrine  80 mcg/ml in normal saline Adult IV Push Syringe (For Blood Pressure Support)   Intravenous Anesthesia Intra-op Para Jerelene CROME, CRNA   160 mcg at 06/21/23 0957   propofol  (DIPRIVAN ) 10 mg/mL bolus/IV push   Intravenous Anesthesia Intra-op Stanislaus, Belinda L, CRNA   150 mg at 06/21/23 0950   rocuronium  (ZEMURON ) injection   Intravenous Anesthesia Intra-op Stanislaus, Belinda L, CRNA   30 mg at 06/21/23 1117   sterile water  for irrigation for irrigation    PRN Mavis Anes, MD   500 mL at 06/21/23 1113    Allergies: Allergies  Allergen Reactions   Bee Pollen     Seasonal allergies   Plaquenil  [Hydroxychloroquine  Sulfate]     rash   Lodine [Etodolac] Swelling and Rash    Past Medical History:  Diagnosis Date   Anemia    Diverticula of colon    2016 colonoscopy   Fibromyalgia    Fibromyalgia    History of GI diverticular bleed    Hypercholesteremia    Hypertension    Multiple gastric ulcers    Sleep apnea    Past Surgical History:  Procedure Laterality Date   allergy shots  weekly   BIOPSY  02/15/2023   Procedure: BIOPSY;  Surgeon: Eartha Angelia Sieving, MD;  [facility];  Service: Gastroenterology;;   SALVADOR  COLONOSCOPY     Dr.Rehman   COLONOSCOPY N/A 01/27/2015   Rehman: multiple diverticula at sigmoid colon,ext hemorrhoids   COLONOSCOPY WITH PROPOFOL  N/A 02/15/2023   Procedure: COLONOSCOPY WITH PROPOFOL ;  Surgeon: Eartha Angelia Sieving, MD;  [facility];  Service: Gastroenterology;  Laterality: N/A;   ESOPHAGOGASTRODUODENOSCOPY (EGD) WITH PROPOFOL  N/A 02/15/2023   Procedure: ESOPHAGOGASTRODUODENOSCOPY (EGD) WITH PROPOFOL ;   Surgeon: Eartha Angelia Sieving, MD;  [facility];  Service: Gastroenterology;  Laterality: N/A;   EYE SURGERY Bilateral    partial cornea transplants    FLEXIBLE SIGMOIDOSCOPY  03/14/2023   Procedure: FLEXIBLE SIGMOIDOSCOPY;  Surgeon: Cinderella Deatrice FALCON, MD;  [facility];  Service: Endoscopy;;   GALLBLADDER SURGERY  12/1993   HOT HEMOSTASIS  02/15/2023   Procedure: HOT HEMOSTASIS (ARGON PLASMA COAGULATION/BICAP);  Surgeon: Eartha Angelia, Sieving, MD;  [facility];  Service: Gastroenterology;;   MATIAS  02/15/2023   Procedure: MATIAS;  Surgeon: Eartha Angelia, Sieving, MD;  [facility];  Service: Gastroenterology;;   UPPER GASTROINTESTINAL ENDOSCOPY     Family History  Problem Relation Age of Onset   Heart disease Mother    Pancreatic cancer Mother    Prostate cancer Father    Healthy Sister    Parkinson's disease Brother    Asthma Sister    Healthy Sister    Social History   Socioeconomic History   Marital status: Married    Spouse name: Not on file   Number of children: Not on file   Years of education: Not on file   Highest education level: Not on file  Occupational History   Not on file  Tobacco Use   Smoking status: Never    Passive exposure: Never   Smokeless tobacco: Never  Vaping Use   Vaping status: Never Used  Substance and Sexual Activity   Alcohol use: Not Currently   Drug use: No   Sexual activity: Not on file  Other Topics Concern   Not on file  Social History Narrative   Not on file   Social Drivers of Health   Financial Resource Strain: Not on file  Food Insecurity: No Food Insecurity (02/12/2023)   Hunger Vital Sign    Worried About Running Out of Food in the Last Year: Never true    Ran Out of Food in the Last Year: Never true  Transportation Needs: No Transportation Needs (02/12/2023)   PRAPARE - Administrator, Civil Service (Medical): No    Lack of Transportation  (Non-Medical): No  Physical Activity: Not on file  Stress: Not on file  Social Connections: Not on file  Intimate Partner Violence: Not At Risk (02/12/2023)   Humiliation, Afraid, Rape, and Kick questionnaire    Fear of Current or Ex-Partner: No    Emotionally Abused: No    Physically Abused: No    Sexually Abused: No    SUBJECTIVE  Review of Systems Constitutional: Patient denies any unintentional weight loss or change in strength lntegumentary: Patient denies any rashes or pruritus Cardiovascular: Patient denies chest pain or syncope Respiratory: Patient denies shortness of breath Gastrointestinal: Patient ***denies nausea, vomiting, constipation, or diarrhea Musculoskeletal: Patient denies muscle cramps or weakness Neurologic: Patient denies convulsions or seizures Allergic/Immunologic: Patient denies recent allergic reaction(s) Hematologic/Lymphatic: Patient denies bleeding tendencies Endocrine: Patient denies heat/cold intolerance  GU: As per HPI.  OBJECTIVE There were no vitals filed for this visit. There is no height or weight on file to calculate BMI.  Physical Examination Constitutional: No obvious distress; patient is non-toxic appearing  Cardiovascular: No visible lower extremity edema.  Respiratory: The patient does not have audible wheezing/stridor; respirations do not appear labored  Gastrointestinal: Abdomen non-distended Musculoskeletal: Normal ROM of UEs  Skin: No obvious rashes/open sores  Neurologic: CN 2-12 grossly intact Psychiatric: Answered questions appropriately with normal affect  Hematologic/Lymphatic/Immunologic: No obvious bruises or sites of spontaneous bleeding  Urine microscopy: ***negative *** WBC/hpf, *** RBC/hpf, *** bacteria UA: ***negative *** WBC/hpf, *** RBC/hpf, *** bacteria ***with no evidence of UTI ***with no evidence of microscopic hematuria ***otherwise unremarkable  PVR: *** ml  ASSESSMENT Colovesical  fistula ***  Will plan for follow up in *** months or sooner if needed. Pt verbalized understanding and agreement. All questions were answered.  PLAN Advised the following: *** ***No follow-ups on file.  No orders of the defined types were placed in this encounter.   It has been explained that the patient is to follow regularly with their PCP in addition to all other providers involved in their care and to follow instructions provided by these respective offices. Patient advised to contact urology clinic if any urologic-pertaining questions, concerns, new symptoms or problems arise in the interim period.  There are no Patient Instructions on file for this visit.  Electronically signed by:  Lauraine KYM Oz, MSN, FNP-C, CUNP 06/21/2023 11:22 AM

## 2023-06-21 NOTE — Interval H&P Note (Signed)
 History and Physical Interval Note:  06/21/2023 9:02 AM  Cole Brown  has presented today for surgery, with the diagnosis of COLOVESICLE FISTULA.  The various methods of treatment have been discussed with the patient and family. After consideration of risks, benefits and other options for treatment, the patient has consented to  Procedure(s): PARTIAL COLECTOMY (N/A) as a surgical intervention.  The patient's history has been reviewed, patient examined, no change in status, stable for surgery.  I have reviewed the patient's chart and labs.  Questions were answered to the patient's satisfaction.     Oneil Budge

## 2023-06-21 NOTE — Plan of Care (Signed)

## 2023-06-21 NOTE — Op Note (Signed)
 Patient:  Cole Brown  DOB:  15-Aug-1945  MRN:  [id number]   Preop Diagnosis: Colovesical fistula  Postop Diagnosis: Same  Procedure: Partial colectomy, repair of colovesical fistula  Surgeon: Oneil Budge, MD  Anes: General Endotracheal  Indications: Patient is a 78 year old white male with a colovesical fistula who now presents for a partial colectomy and repair of the colovesical fistula.  The risks and benefits of the procedures including bleeding, infection, cardiopulmonary difficulties, the possibility of anastomotic leak, and the possible need for a colostomy were fully explained to the patient, who gave informed consent.  Procedure note: The patient was placed in the lithotomy position after induction of general endotracheal anesthesia.  The abdomen and perineum were prepped and draped using the usual sterile technique with ChloraPrep.  Surgical site confirmation was performed.  A midline incision was made from the umbilicus to the suprapubic region.  The peritoneal cavity was entered into without difficulty.  The sigmoid colon was mobilized along the line of Toldt.  This was taken down to the area of that the distal sigmoid colon was adhesed to the dome of the bladder.  Using both blunt and sharp dissection, I was able to free away the colon from the bladder.  The resultant defect in the bladder was approximately three quarters of a centimeter in size.  A curette was used to excise any granulomatous tissue on the wall of the bladder.  The defect was then closed in 2 layers using 2-0 Vicryl interrupted sutures.  The bladder was instilled with saline and no leak was noted.  I then proceeded with the partial colectomy.  The left ureter was identified and kept from the operative field.  A GIA 75 stapler was placed across the mid sigmoid colon and fired.  This was likewise done to the distal sigmoid colon.  The tissue in between was thickened, consistent with possible diverticulitis.  The  mesentery was divided using a LigaSure high up along the colon.  A suture was placed distally for orientation purposes.  The specimen was sent to pathology further examination.  A side-to-side Colo colotomy was performed using a GIA 75 stapler.  The colotomy was closed using a TA 60 stapler.  The staple line was bolstered using 3-0 silk Lembert sutures.  The bowel was returned into the abdominal cavity in an orderly fashion.  The abdominal cavity was copiously irrigated with normal saline.  The anastomosis was inspected and no leak was noted.  A wide open anastomosis was palpated.  All operating personnel then changed their gown and gloves.  A new set up was used for closure.  The fascia was reapproximated using a looped 0 PDS running suture.  The subcutaneous layer was irrigated with normal saline.  0.5% Sensorcaine  was instilled into the surrounding wound.  The skin was closed using staples.  Betadine ointment and dry sterile dressing were applied.  All tape and needle counts were correct at the end of the procedure.  The patient was extubated in the operating room and transferred to PACU in stable condition.  Complications: None  EBL: 100 cc  Specimen: Sigmoid colon, suture distal

## 2023-06-21 NOTE — Transfer of Care (Signed)
 Immediate Anesthesia Transfer of Care Note  Patient: Cole Brown  Procedure(s) Performed: PARTIAL COLECTOMY,  repair colovesicle fistula (Abdomen)  Patient Location: PACU  Anesthesia Type:General  Level of Consciousness: drowsy and patient cooperative  Airway & Oxygen  Therapy: Patient Spontanous Breathing and Patient connected to nasal cannula oxygen   Post-op Assessment: Report given to RN and Post -op Vital signs reviewed and stable  Post vital signs: Reviewed and stable  Last Vitals:  Vitals Value Taken Time  BP 140/74 06/21/23 1153  Temp    Pulse 62 06/21/23 1157  Resp 11 06/21/23 1157  SpO2 99 % 06/21/23 1157  Vitals shown include unfiled device data.  Last Pain:  Vitals:   06/21/23 0833  TempSrc: Oral  PainSc: 0-No pain         Complications: No notable events documented.

## 2023-06-21 NOTE — Anesthesia Preprocedure Evaluation (Signed)
 Anesthesia Evaluation  Patient identified by MRN, date of birth, ID band Patient awake    Reviewed: Allergy & Precautions, H&P , NPO status , Patient's Chart, lab work & pertinent test results, reviewed documented beta blocker date and time   Airway Mallampati: II  TM Distance: >3 FB Neck ROM: full    Dental no notable dental hx.    Pulmonary sleep apnea    Pulmonary exam normal breath sounds clear to auscultation       Cardiovascular Exercise Tolerance: Good hypertension,  Rhythm:regular Rate:Normal     Neuro/Psych  Neuromuscular disease  negative psych ROS   GI/Hepatic Neg liver ROS, PUD,,,  Endo/Other  negative endocrine ROS    Renal/GU negative Renal ROS  negative genitourinary   Musculoskeletal   Abdominal   Peds  Hematology  (+) Blood dyscrasia, anemia   Anesthesia Other Findings   Reproductive/Obstetrics negative OB ROS                             Anesthesia Physical Anesthesia Plan  ASA: 2  Anesthesia Plan: General and General ETT   Post-op Pain Management:    Induction:   PONV Risk Score and Plan: Ondansetron   Airway Management Planned:   Additional Equipment:   Intra-op Plan:   Post-operative Plan:   Informed Consent: I have reviewed the patients History and Physical, chart, labs and discussed the procedure including the risks, benefits and alternatives for the proposed anesthesia with the patient or authorized representative who has indicated his/her understanding and acceptance.     Dental Advisory Given  Plan Discussed with: CRNA  Anesthesia Plan Comments:        Anesthesia Quick Evaluation

## 2023-06-22 ENCOUNTER — Encounter (HOSPITAL_COMMUNITY): Payer: Self-pay | Admitting: General Surgery

## 2023-06-22 ENCOUNTER — Other Ambulatory Visit: Payer: Self-pay

## 2023-06-22 LAB — CBC
HCT: 30.6 % — ABNORMAL LOW (ref 39.0–52.0)
Hemoglobin: 9.7 g/dL — ABNORMAL LOW (ref 13.0–17.0)
MCH: 26.6 pg (ref 26.0–34.0)
MCHC: 31.7 g/dL (ref 30.0–36.0)
MCV: 84.1 fL (ref 80.0–100.0)
Platelets: 229 10*3/uL (ref 150–400)
RBC: 3.64 MIL/uL — ABNORMAL LOW (ref 4.22–5.81)
RDW: 24.9 % — ABNORMAL HIGH (ref 11.5–15.5)
WBC: 12.2 10*3/uL — ABNORMAL HIGH (ref 4.0–10.5)
nRBC: 0 % (ref 0.0–0.2)

## 2023-06-22 LAB — BASIC METABOLIC PANEL
Anion gap: 7 (ref 5–15)
BUN: 12 mg/dL (ref 8–23)
CO2: 21 mmol/L — ABNORMAL LOW (ref 22–32)
Calcium: 8.4 mg/dL — ABNORMAL LOW (ref 8.9–10.3)
Chloride: 103 mmol/L (ref 98–111)
Creatinine, Ser: 0.75 mg/dL (ref 0.61–1.24)
GFR, Estimated: >60 mL/min (ref >60)
Glucose, Bld: 132 mg/dL — ABNORMAL HIGH (ref 70–99)
Potassium: 3.8 mmol/L (ref 3.5–5.1)
Sodium: 131 mmol/L — ABNORMAL LOW (ref 135–145)

## 2023-06-22 LAB — MAGNESIUM: Magnesium: 1.8 mg/dL (ref 1.7–2.4)

## 2023-06-22 LAB — PHOSPHORUS: Phosphorus: 4 mg/dL (ref 2.5–4.6)

## 2023-06-22 NOTE — Plan of Care (Signed)
  Problem: Activity: Goal: Risk for activity intolerance will decrease Outcome: Not Progressing   Problem: Nutrition: Goal: Adequate nutrition will be maintained Outcome: Not Progressing   Problem: Pain Management: Goal: General experience of comfort will improve Outcome: Progressing

## 2023-06-22 NOTE — Progress Notes (Addendum)
 Patient with intermittent confusion/forgetfulness that increased as the night progressed. Patient requested his temazepam  for sleep while holding the empty cup from taking his temazepam  and montelukast  which we discussed before I gave him.   When I rounded on the patient a little after midnight, patient stating that he wanted his temazepam  for sleep. Explained that he had already gotten it. Patient stating that he does not remember taking it. I asked if he has seen me before and he said no.    Throughout the night, patient has endorsed having some lower abdominal pain. I discussed the various pain medications available to him and let him know that we didn't want his pain to get out of control or for him to be in pain unnecessarily. When pain medications offered, patient has stated that he did not want them.

## 2023-06-22 NOTE — Progress Notes (Signed)
 1 Day Post-Op  Subjective: Having some intermittent incisional pain.  Has not had a bowel movement or passed flatus yet.  He is in a chair comfortably.  Objective: Vital signs in last 24 hours: Temp:  [97.6 F (36.4 C)-98.7 F (37.1 C)] 98 F (36.7 C) (01/04 0402) Pulse Rate:  [62-83] 66 (01/04 0402) Resp:  [8-20] 20 (01/04 0402) BP: (123-148)/(66-82) 123/66 (01/04 0402) SpO2:  [95 %-100 %] 98 % (01/04 0402) Last BM Date :  (PTA)  Intake/Output from previous day: 01/03 0701 - 01/04 0700 In: 2419.7 [P.O.:240; I.V.:2079.7; IV Piggyback:100] Out: 2915 [Urine:2800; Blood:100] Intake/Output this shift: No intake/output data recorded.  General appearance: alert, cooperative, and no distress Resp: clear to auscultation bilaterally Cardio: regular rate and rhythm, S1, S2 normal, no murmur, click, rub or gallop GI: Soft, incision healing well.  Lab Results:  Recent Labs    06/22/23 0446  WBC 12.2*  HGB 9.7*  HCT 30.6*  PLT 229   BMET Recent Labs    06/22/23 0446  NA 131*  K 3.8  CL 103  CO2 21*  GLUCOSE 132*  BUN 12  CREATININE 0.75  CALCIUM 8.4*   PT/INR No results for input(s): LABPROT, INR in the last 72 hours.  Studies/Results: No results found.  Anti-infectives: Anti-infectives (From admission, onward)    Start     Dose/Rate Route Frequency Ordered Stop   06/21/23 0745  cefoTEtan  (CEFOTAN ) 2 g in sodium chloride  0.9 % 100 mL IVPB        2 g 200 mL/hr over 30 Minutes Intravenous On call to O.R. 06/21/23 0737 06/21/23 2156   06/21/23 0741  sodium chloride  0.9 % with cefoTEtan  (CEFOTAN ) ADS Med       Note to Pharmacy: Bennet Hun R: cabinet override      06/21/23 0741 06/21/23 1034       Assessment/Plan: s/p Procedure(s): PARTIAL COLECTOMY,  repair colovesicle fistula Impression: Stable on postoperative day 1.  Tolerating clear liquid diet well.  Awaiting return of bowel function.  Will start ambulating patient.  Foley is to remain in place as a  bladder repair was performed.  Will Hep-Lock IV.  LOS: 1 day    Oneil Budge 06/22/2023

## 2023-06-23 ENCOUNTER — Encounter (HOSPITAL_COMMUNITY): Payer: Self-pay | Admitting: General Surgery

## 2023-06-23 LAB — BASIC METABOLIC PANEL
Anion gap: 6 (ref 5–15)
BUN: 11 mg/dL (ref 8–23)
CO2: 25 mmol/L (ref 22–32)
Calcium: 8.5 mg/dL — ABNORMAL LOW (ref 8.9–10.3)
Chloride: 101 mmol/L (ref 98–111)
Creatinine, Ser: 0.8 mg/dL (ref 0.61–1.24)
GFR, Estimated: >60 mL/min (ref >60)
Glucose, Bld: 88 mg/dL (ref 70–99)
Potassium: 3.7 mmol/L (ref 3.5–5.1)
Sodium: 132 mmol/L — ABNORMAL LOW (ref 135–145)

## 2023-06-23 LAB — MAGNESIUM: Magnesium: 1.9 mg/dL (ref 1.7–2.4)

## 2023-06-23 LAB — CBC
HCT: 30.8 % — ABNORMAL LOW (ref 39.0–52.0)
Hemoglobin: 9.8 g/dL — ABNORMAL LOW (ref 13.0–17.0)
MCH: 26.7 pg (ref 26.0–34.0)
MCHC: 31.8 g/dL (ref 30.0–36.0)
MCV: 83.9 fL (ref 80.0–100.0)
Platelets: 199 10*3/uL (ref 150–400)
RBC: 3.67 MIL/uL — ABNORMAL LOW (ref 4.22–5.81)
RDW: 24.7 % — ABNORMAL HIGH (ref 11.5–15.5)
WBC: 7.5 10*3/uL (ref 4.0–10.5)
nRBC: 0 % (ref 0.0–0.2)

## 2023-06-23 LAB — PHOSPHORUS: Phosphorus: 3.4 mg/dL (ref 2.5–4.6)

## 2023-06-23 NOTE — Plan of Care (Signed)

## 2023-06-23 NOTE — Progress Notes (Signed)
 2 Days Post-Op  Subjective: Patient has not passed flatus or had a bowel movement yet.  Tolerating clear liquid diet well.  Objective: Vital signs in last 24 hours: Temp:  [98 F (36.7 C)-98.8 F (37.1 C)] 98.1 F (36.7 C) (01/05 0509) Pulse Rate:  [59-63] 59 (01/05 0509) Resp:  [16-20] 16 (01/05 0509) BP: (117-131)/(68-73) 117/69 (01/05 0509) SpO2:  [98 %-99 %] 98 % (01/05 0509) Last BM Date :  (PTA)  Intake/Output from previous day: 01/04 0701 - 01/05 0700 In: 1540 [P.O.:1540] Out: 2150 [Urine:2150] Intake/Output this shift: No intake/output data recorded.  General appearance: alert, cooperative, and no distress Resp: clear to auscultation bilaterally Cardio: regular rate and rhythm, S1, S2 normal, no murmur, click, rub or gallop GI: Soft, incision healing well.  Bowel sounds present.  Lab Results:  Recent Labs    06/22/23 0446 06/23/23 0435  WBC 12.2* 7.5  HGB 9.7* 9.8*  HCT 30.6* 30.8*  PLT 229 199   BMET Recent Labs    06/22/23 0446 06/23/23 0435  NA 131* 132*  K 3.8 3.7  CL 103 101  CO2 21* 25  GLUCOSE 132* 88  BUN 12 11  CREATININE 0.75 0.80  CALCIUM 8.4* 8.5*   PT/INR No results for input(s): LABPROT, INR in the last 72 hours.  Studies/Results: No results found.  Anti-infectives: Anti-infectives (From admission, onward)    Start     Dose/Rate Route Frequency Ordered Stop   06/21/23 0745  cefoTEtan  (CEFOTAN ) 2 g in sodium chloride  0.9 % 100 mL IVPB        2 g 200 mL/hr over 30 Minutes Intravenous On call to O.R. 06/21/23 0737 06/21/23 2156   06/21/23 0741  sodium chloride  0.9 % with cefoTEtan  (CEFOTAN ) ADS Med       Note to Pharmacy: Bennet Hun R: cabinet override      06/21/23 0741 06/21/23 1034       Assessment/Plan: s/p Procedure(s): PARTIAL COLECTOMY,  repair colovesicle fistula Impression: Stable on postoperative day 2.  Awaiting full return of bowel function.  Will advance to full liquid diet.  Continue supportive care.   Final pathology pending.  LOS: 2 days    Oneil Budge 06/23/2023

## 2023-06-24 ENCOUNTER — Emergency Department (HOSPITAL_COMMUNITY)
Admission: EM | Admit: 2023-06-24 | Discharge: 2023-06-25 | Disposition: A | Discharge status: Home / Self Care | Payer: Medicare HMO | Attending: Emergency Medicine | Admitting: Emergency Medicine

## 2023-06-24 ENCOUNTER — Other Ambulatory Visit: Payer: Self-pay

## 2023-06-24 DIAGNOSIS — R739 Hyperglycemia, unspecified: Secondary | ICD-10-CM

## 2023-06-24 DIAGNOSIS — R109 Unspecified abdominal pain: Secondary | ICD-10-CM | POA: Diagnosis present

## 2023-06-24 DIAGNOSIS — I10 Essential (primary) hypertension: Secondary | ICD-10-CM | POA: Diagnosis not present

## 2023-06-24 DIAGNOSIS — D649 Anemia, unspecified: Secondary | ICD-10-CM | POA: Insufficient documentation

## 2023-06-24 DIAGNOSIS — Z79899 Other long term (current) drug therapy: Secondary | ICD-10-CM | POA: Insufficient documentation

## 2023-06-24 DIAGNOSIS — R1084 Generalized abdominal pain: Secondary | ICD-10-CM | POA: Insufficient documentation

## 2023-06-24 DIAGNOSIS — E871 Hypo-osmolality and hyponatremia: Secondary | ICD-10-CM | POA: Diagnosis not present

## 2023-06-24 DIAGNOSIS — R7309 Other abnormal glucose: Secondary | ICD-10-CM | POA: Insufficient documentation

## 2023-06-24 LAB — CBC
HCT: 31.7 % — ABNORMAL LOW (ref 39.0–52.0)
Hemoglobin: 9.7 g/dL — ABNORMAL LOW (ref 13.0–17.0)
MCH: 25.5 pg — ABNORMAL LOW (ref 26.0–34.0)
MCHC: 30.6 g/dL (ref 30.0–36.0)
MCV: 83.4 fL (ref 80.0–100.0)
Platelets: 205 10*3/uL (ref 150–400)
RBC: 3.8 MIL/uL — ABNORMAL LOW (ref 4.22–5.81)
RDW: 24.3 % — ABNORMAL HIGH (ref 11.5–15.5)
WBC: 6.5 10*3/uL (ref 4.0–10.5)
nRBC: 0 % (ref 0.0–0.2)

## 2023-06-24 LAB — BASIC METABOLIC PANEL
Anion gap: 7 (ref 5–15)
BUN: 12 mg/dL (ref 8–23)
CO2: 23 mmol/L (ref 22–32)
Calcium: 8.6 mg/dL — ABNORMAL LOW (ref 8.9–10.3)
Chloride: 100 mmol/L (ref 98–111)
Creatinine, Ser: 0.75 mg/dL (ref 0.61–1.24)
GFR, Estimated: >60 mL/min (ref >60)
Glucose, Bld: 102 mg/dL — ABNORMAL HIGH (ref 70–99)
Potassium: 3.7 mmol/L (ref 3.5–5.1)
Sodium: 130 mmol/L — ABNORMAL LOW (ref 135–145)

## 2023-06-24 LAB — SURGICAL PATHOLOGY

## 2023-06-24 LAB — MAGNESIUM: Magnesium: 1.9 mg/dL (ref 1.7–2.4)

## 2023-06-24 LAB — PHOSPHORUS: Phosphorus: 4.3 mg/dL (ref 2.5–4.6)

## 2023-06-24 MED ORDER — HYDROCODONE-ACETAMINOPHEN 5-325 MG PO TABS: 1.0000 | ORAL_TABLET | Freq: Four times a day (QID) | ORAL | 0 refills | Status: DC | PRN

## 2023-06-24 NOTE — Consult Note (Signed)
 [facility] Liaison Note  06/24/2023  Cole Brown 20-Jun-1945 [id number]  Location: RN Hospital Liaison screened the patient remotely at [facility].  Insurance: [facility] HMO   Cole Brown is a 78 y.o. male who is a Primary Care Patient of Marvine Rush, MD [facility]).The patient was screened for  readmission hospitalization with noted extreme risk score for unplanned readmission risk with 3 IP/5 ED in 6 months.  The patient was assessed for potential Care Management service needs for post hospital transition for care coordination. Review of patient's electronic medical record reveals patient was admitted for s/p partial colectomy. Providers office is not participating in care management services with [facility]. Provider's office will follow up accordingly with this pt for post hospital discharge needs.   [facility] Care Management/Population Health does not replace or interfere with any arrangements made by the Inpatient Transition of Care team.   For questions contact:   Olam Ku, RN, [facility] Liaison [place]   [facility], Population Health Office Hours MTWF  8:00 am-6:00 pm Direct Dial: [phone] mobile [phone] [Office toll free line] Office Hours are M-F 8:30 - 5 pm [email or web address]

## 2023-06-24 NOTE — Plan of Care (Signed)
  Problem: Education: Goal: Knowledge of General Education information will improve Description: Including pain rating scale, medication(s)/side effects and non-pharmacologic comfort measures Outcome: Progressing   Problem: Health Behavior/Discharge Planning: Goal: Ability to manage health-related needs will improve Outcome: Progressing   Problem: Clinical Measurements: Goal: Ability to maintain clinical measurements within normal limits will improve Outcome: Progressing Goal: Will remain free from infection Outcome: Progressing Goal: Diagnostic test results will improve Outcome: Progressing   Problem: Nutrition: Goal: Adequate nutrition will be maintained Outcome: Progressing   Problem: Activity: Goal: Risk for activity intolerance will decrease Outcome: Progressing

## 2023-06-24 NOTE — Progress Notes (Signed)
 Pt d/c at 1350. Pt a/o. Wife at bedside. Read over written instructions. Demonstrated change of drainage foley bag to leg bag with written instructions. Pt and wife verbalized understanding. Educated on importance to wash hands before and after touching foley bags and to keep off floor and below bladder at all times to drain. Pt and wife verbalized understanding.

## 2023-06-24 NOTE — ED Triage Notes (Signed)
 Pt c/o bloody stools that began about an hour PTA, abdominal pain, and N/V. Denies taking blood thinners.  Pt was d/c today around 1400 after being admitted for a partial colectomy.

## 2023-06-24 NOTE — Discharge Summary (Signed)
 Physician Discharge Summary  Patient ID: Cole Brown MRN: [id number] DOB/AGE: [birth year] 78 y.o.  Admit date: 06/21/2023 Discharge date: 06/24/2023  Admission Diagnoses: Colovesical fistula  Discharge Diagnoses: Same Principal Problem:   S/P partial colectomy Anxiety, fibromyalgia, macular degeneration  Discharged Condition: Good  Hospital Course: Patient is a 78 year old white male with a known history of a colovesical fistula who underwent a partial colectomy with repair of colovesical fistula on 06/21/2023.  Tolerated the surgery well.  His postoperative course has been for the most part unremarkable.  His diet was advanced out difficulty.  He is having minimal incisional pain.  Final pathology was negative for malignancy.  The patient is being discharged home with a Foley catheter in good and stable condition.  Treatments: surgery: Partial colectomy, repair of colovesicle fistula on 06/21/23  Discharge Exam: Blood pressure 119/73, pulse (!) 59, temperature 97.7 F (36.5 C), temperature source Oral, resp. rate 16, height 5' 11 (1.803 m), weight 80.2 kg, SpO2 98%. General appearance: alert, cooperative, and no distress Resp: clear to auscultation bilaterally Cardio: regular rate and rhythm, S1, S2 normal, no murmur, click, rub or gallop GI: Soft, incision healing well.  Disposition: Discharge disposition: 01-Home or Self Care       Discharge Instructions     Diet - low sodium heart healthy   Complete by: As directed    Increase activity slowly   Complete by: As directed       Allergies as of 06/24/2023       Reactions   Bee Pollen    Seasonal allergies   Plaquenil  [hydroxychloroquine  Sulfate]    rash   Lodine [etodolac] Swelling, Rash        Medication List     TAKE these medications    acetaminophen  500 MG tablet Commonly known as: TYLENOL  Take 1,000 mg by mouth every 6 (six) hours as needed (pain.).   amLODipine  5 MG tablet Commonly known as:  NORVASC  Take 1 tablet (5 mg total) by mouth daily.   atenolol  25 MG tablet Commonly known as: TENORMIN  TAKE (1/2) TABLET BY MOUTH ONCE DAILY. What changed: See the new instructions.   azelastine  0.1 % nasal spray Commonly known as: ASTELIN  Place 1 spray into both nostrils 2 (two) times daily as needed for allergies.   cyanocobalamin  1000 MCG tablet Commonly known as: VITAMIN B12 Take 1,000 mcg by mouth daily.   diphenhydrAMINE  25 MG tablet Commonly known as: BENADRYL  Take 25 mg by mouth daily as needed for allergies.   diphenhydrAMINE -zinc acetate cream Commonly known as: BENADRYL  Apply topically daily.   EPINEPHrine  0.3 mg/0.3 mL Soaj injection Commonly known as: EPI-PEN Inject 0.3 mg into the muscle as needed for anaphylaxis.   faricimab -svoa 6 MG/0.05ML Sosy intravitreal injection Commonly known as: VABYSMO  6 mg by Intravitreal route every 30 (thirty) days.   Fish Oil 1000 MG Caps Take 1,000 mg by mouth in the morning.   FLUoxetine  10 MG tablet Commonly known as: PROZAC  Take 10 mg by mouth in the morning.   fluticasone  50 MCG/ACT nasal spray Commonly known as: FLONASE  Place 1-2 sprays into both nostrils daily as needed for allergies.   folic acid  1 MG tablet Commonly known as: FOLVITE  TAKE 2 TABLETS BY MOUTH DAILY.   furosemide  20 MG tablet Commonly known as: LASIX  Take 20 mg by mouth every other day. In the morning.   HYDROcodone -acetaminophen  5-325 MG tablet Commonly known as: Norco Take 1 tablet by mouth every 6 (six) hours as needed for moderate  pain (pain score 4-6).   hydrocortisone  1 % ointment Apply 1 Application topically daily.   ICAPS AREDS 2 PO Take 1 capsule by mouth in the morning.   Investigational - Study Medication Take 40 mg by mouth in the morning. Atorvastatin study med. PREVENTABLE trial   levocetirizine 5 MG tablet Commonly known as: XYZAL  Take 5 mg by mouth every evening.   metroNIDAZOLE  500 MG tablet Commonly known as:  Flagyl  Take 1 tablet (500 mg total) by mouth 3 (three) times daily. Take one tablet by mouth at 1pm, 2pm, and 9pm the day before surgery   montelukast  10 MG tablet Commonly known as: SINGULAIR  Take 10 mg by mouth at bedtime.   neomycin  500 MG tablet Commonly known as: MYCIFRADIN  Take 2 tablets (1,000 mg total) by mouth 3 (three) times daily. Take 2 tablets by mouth at 1pm, 2pm, and 9pm the day before surgery   pantoprazole  40 MG tablet Commonly known as: Protonix  Take 1 tablet (40 mg total) by mouth daily.   TESTOSTERONE  COMPOUNDING KIT TD Apply 4 Pump topically every evening. 10% compounded gel from [facility] (4 clicks)   triazolam  0.25 MG tablet Commonly known as: HALCION  Take 0.25 mg by mouth at bedtime.   Vitamin D  50 MCG (2000 UT) Caps Take 2,000 Units by mouth daily.        Follow-up Information     Mavis Anes, MD. Schedule an appointment as soon as possible for a visit on 07/02/2023.   Specialty: General Surgery Contact information: [id number] [place] [address] [phone]                 Signed: Anes Mavis 06/24/2023, 12:25 PM

## 2023-06-24 NOTE — Progress Notes (Signed)
   06/24/23 1248  TOC Brief Assessment  Insurance and Status Reviewed  Patient has primary care physician Yes  Home environment has been reviewed Single family home  Prior level of function: independent  Prior/Current Home Services No current home services  Social Drivers of Health Review SDOH reviewed no interventions necessary  Readmission risk has been reviewed Yes  Transition of care needs no transition of care needs at this time   Transition of Care Department [facility]) has reviewed patient and no TOC needs have been identified at this time. We will continue to monitor patient advancement through interdisciplinary progression rounds. If new patient transition needs arise, please place a TOC consult.

## 2023-06-24 NOTE — Care Management Important Message (Signed)
 Important Message  Patient Details  Name: Cole Brown MRN: [id number] Date of Birth: Oct 20, 1945   Important Message Given:  Yes - Medicare IM     [name] 06/24/2023, 12:58 PM

## 2023-06-25 ENCOUNTER — Other Ambulatory Visit: Payer: Self-pay | Admitting: General Surgery

## 2023-06-25 DIAGNOSIS — N321 Vesicointestinal fistula: Secondary | ICD-10-CM

## 2023-06-25 LAB — CBC WITH DIFFERENTIAL/PLATELET
Absolute Lymphocytes: 1360 {cells}/uL (ref 850–3900)
Absolute Monocytes: 547 {cells}/uL (ref 200–950)
Basophils Absolute: 91 {cells}/uL (ref 0–200)
Basophils Relative: 1.2 %
Eosinophils Absolute: 220 {cells}/uL (ref 15–500)
Eosinophils Relative: 2.9 %
HCT: 29.1 % — ABNORMAL LOW (ref 38.5–50.0)
Hemoglobin: 8.8 g/dL — ABNORMAL LOW (ref 13.2–17.1)
MCH: 24.1 pg — ABNORMAL LOW (ref 27.0–33.0)
MCHC: 30.2 g/dL — ABNORMAL LOW (ref 32.0–36.0)
MCV: 79.7 fL — ABNORMAL LOW (ref 80.0–100.0)
MPV: 9.7 fL (ref 7.5–12.5)
Monocytes Relative: 7.2 %
Neutro Abs: 5381 {cells}/uL (ref 1500–7800)
Neutrophils Relative %: 70.8 %
Platelets: 265 10*3/uL (ref 140–400)
RBC: 3.65 10*6/uL — ABNORMAL LOW (ref 4.20–5.80)
RDW: 22.6 % — ABNORMAL HIGH (ref 11.0–15.0)
Total Lymphocyte: 17.9 %
WBC: 7.6 10*3/uL (ref 3.8–10.8)

## 2023-06-25 LAB — TYPE AND SCREEN
ABO/RH(D): O POS
Antibody Screen: NEGATIVE

## 2023-06-25 LAB — COMPLETE METABOLIC PANEL WITH GFR
AG Ratio: 1.2 (calc) (ref 1.0–2.5)
ALT: 12 U/L (ref 9–46)
AST: 12 U/L (ref 10–35)
Albumin: 3.5 g/dL — ABNORMAL LOW (ref 3.6–5.1)
Alkaline phosphatase (APISO): 116 U/L (ref 35–144)
BUN: 24 mg/dL (ref 7–25)
CO2: 24 mmol/L (ref 20–32)
Calcium: 8.6 mg/dL (ref 8.6–10.3)
Chloride: 102 mmol/L (ref 98–110)
Creat: 1.16 mg/dL (ref 0.70–1.28)
Globulin: 3 g/dL (ref 1.9–3.7)
Glucose, Bld: 99 mg/dL (ref 65–139)
Potassium: 4.7 mmol/L (ref 3.5–5.3)
Sodium: 133 mmol/L — ABNORMAL LOW (ref 135–146)
Total Bilirubin: 0.3 mg/dL (ref 0.2–1.2)
Total Protein: 6.5 g/dL (ref 6.1–8.1)
eGFR: 64 mL/min/{1.73_m2} (ref >=60)

## 2023-06-25 LAB — COMPREHENSIVE METABOLIC PANEL
ALT: 26 U/L (ref 0–44)
AST: 20 U/L (ref 15–41)
Albumin: 3.4 g/dL — ABNORMAL LOW (ref 3.5–5.0)
Alkaline Phosphatase: 94 U/L (ref 38–126)
Anion gap: 8 (ref 5–15)
BUN: 21 mg/dL (ref 8–23)
CO2: 24 mmol/L (ref 22–32)
Calcium: 9 mg/dL (ref 8.9–10.3)
Chloride: 97 mmol/L — ABNORMAL LOW (ref 98–111)
Creatinine, Ser: 1.09 mg/dL (ref 0.61–1.24)
GFR, Estimated: >60 mL/min (ref >60)
Glucose, Bld: 170 mg/dL — ABNORMAL HIGH (ref 70–99)
Potassium: 4.3 mmol/L (ref 3.5–5.1)
Sodium: 129 mmol/L — ABNORMAL LOW (ref 135–145)
Total Bilirubin: 0.8 mg/dL (ref 0.0–1.2)
Total Protein: 7 g/dL (ref 6.5–8.1)

## 2023-06-25 LAB — CBC
HCT: 35.4 % — ABNORMAL LOW (ref 39.0–52.0)
Hemoglobin: 11.2 g/dL — ABNORMAL LOW (ref 13.0–17.0)
MCH: 26.4 pg (ref 26.0–34.0)
MCHC: 31.6 g/dL (ref 30.0–36.0)
MCV: 83.3 fL (ref 80.0–100.0)
Platelets: 258 10*3/uL (ref 150–400)
RBC: 4.25 MIL/uL (ref 4.22–5.81)
RDW: 24 % — ABNORMAL HIGH (ref 11.5–15.5)
WBC: 13.2 10*3/uL — ABNORMAL HIGH (ref 4.0–10.5)
nRBC: 0 % (ref 0.0–0.2)

## 2023-06-25 LAB — BETA-2 GLYCOPROTEIN ANTIBODIES
Beta-2 Glyco 1 IgA: 17.6 U/mL (ref <20.0)
Beta-2 Glyco 1 IgM: >112 U/mL — ABNORMAL HIGH (ref <20.0)
Beta-2 Glyco I IgG: 10 U/mL (ref <20.0)

## 2023-06-25 LAB — PROTEIN / CREATININE RATIO, URINE
Creatinine, Urine: 64 mg/dL (ref 20–320)
Protein/Creat Ratio: 344 mg/g{creat} — ABNORMAL HIGH (ref 25–148)
Protein/Creatinine Ratio: 0.344 mg/mg{creat} — ABNORMAL HIGH (ref 0.025–0.148)
Total Protein, Urine: 22 mg/dL (ref 5–25)

## 2023-06-25 LAB — CARDIOLIPIN ANTIBODIES, IGG, IGM, IGA
Anticardiolipin IgA: 15.5 [APL'U]/mL (ref <20.0)
Anticardiolipin IgG: 11.3 [GPL'U]/mL (ref <20.0)
Anticardiolipin IgM: >112 [MPL'U]/mL — ABNORMAL HIGH (ref <20.0)

## 2023-06-25 LAB — C3 AND C4
C3 Complement: 124 mg/dL (ref 82–185)
C4 Complement: 22 mg/dL (ref 15–53)

## 2023-06-25 LAB — SEDIMENTATION RATE: Sed Rate: 34 mm/h — ABNORMAL HIGH (ref 0–20)

## 2023-06-25 LAB — ANTI-DNA ANTIBODY, DOUBLE-STRANDED: ds DNA Ab: <1 [IU]/mL

## 2023-06-25 LAB — ANA: Anti Nuclear Antibody (ANA): POSITIVE — AB

## 2023-06-25 LAB — ANTI-NUCLEAR AB-TITER (ANA TITER): ANA Titer 1: 1:320 {titer} — ABNORMAL HIGH

## 2023-06-25 LAB — CBC MORPHOLOGY

## 2023-06-25 LAB — POC OCCULT BLOOD, ED: Fecal Occult Bld: NEGATIVE

## 2023-06-25 MED ORDER — ONDANSETRON HCL 4 MG/2ML IJ SOLN: 4.0000 mg | Freq: Once | INTRAMUSCULAR | Status: DC

## 2023-06-25 NOTE — ED Notes (Signed)
 Pt bladder scanned. Unable to determine amount in it due to lack of urine.

## 2023-06-25 NOTE — ED Provider Notes (Signed)
 [place] EMERGENCY DEPARTMENT AT [name] Provider Note   CSN: [id number] Arrival date & time: 06/24/23  2314     History  Chief Complaint  Patient presents with   Hematochezia    Cole Brown is a 78 y.o. male.  The history is provided by the patient.  He has history of hypertension, hyperlipidemia, [name] disease, GI bleeding and was discharged on 06/24/2023 after being admitted for partial colectomy and comes in because of passing dark red blood tonight.  He developed severe abdominal cramping and then had a bowel movement which had a lot of dark red blood.  He denies any clots.  Since then, he has not had any further pain and has not passed any more blood.  Also, his wife is concerned that his catheter might not be draining because there has been very little urine output.   Home Medications Prior to Admission medications   Medication Sig Start Date End Date Taking? Authorizing Provider  acetaminophen  (TYLENOL ) 500 MG tablet Take 1,000 mg by mouth every 6 (six) hours as needed (pain.).    [provider]  amLODipine  (NORVASC ) 5 MG tablet Take 1 tablet (5 mg total) by mouth daily. 08/28/22   Miriam Norris, NP  atenolol  (TENORMIN ) 25 MG tablet TAKE (1/2) TABLET BY MOUTH ONCE DAILY. Patient taking differently: Take 12.5 mg by mouth every evening. 12/03/22   Alvan Dorn FALCON, MD  azelastine  (ASTELIN ) 0.1 % nasal spray Place 1 spray into both nostrils 2 (two) times daily as needed for allergies. 09/17/19   [provider]  Cholecalciferol  (VITAMIN D ) 50 MCG (2000 UT) CAPS Take 2,000 Units by mouth daily.    [provider]  cyanocobalamin  (VITAMIN B12) 1000 MCG tablet Take 1,000 mcg by mouth daily.    [provider]  diphenhydrAMINE  (BENADRYL ) 25 MG tablet Take 25 mg by mouth daily as needed for allergies.    [provider]  diphenhydrAMINE -zinc acetate (BENADRYL ) cream Apply topically daily.    [provider]   EPINEPHrine  0.3 mg/0.3 mL IJ SOAJ injection Inject 0.3 mg into the muscle as needed for anaphylaxis. 10/14/19   [provider]  faricimab -svoa (VABYSMO ) 6 MG/0.05ML SOSY intravitreal injection 6 mg by Intravitreal route every 30 (thirty) days.    [provider]  FLUoxetine  (PROZAC ) 10 MG tablet Take 10 mg by mouth in the morning.    [provider]  fluticasone  (FLONASE ) 50 MCG/ACT nasal spray Place 1-2 sprays into both nostrils daily as needed for allergies. 07/27/20   [provider]  folic acid  (FOLVITE ) 1 MG tablet TAKE 2 TABLETS BY MOUTH DAILY. Patient taking differently: Take 2 mg by mouth daily. 10/12/22   Cheryl Waddell HERO, PA-C  furosemide  (LASIX ) 20 MG tablet Take 20 mg by mouth every other day. In the morning.    [provider]  HYDROcodone -acetaminophen  (NORCO) 5-325 MG tablet Take 1 tablet by mouth every 6 (six) hours as needed for moderate pain (pain score 4-6). 06/24/23   Mavis Anes, MD  hydrocortisone  1 % ointment Apply 1 Application topically daily.    [provider]  Investigational - Study Medication Take 40 mg by mouth in the morning. Atorvastatin study med. PREVENTABLE trial    [provider]  levocetirizine (XYZAL ) 5 MG tablet Take 5 mg by mouth every evening.     [provider]  metroNIDAZOLE  (FLAGYL ) 500 MG tablet Take 1 tablet (500 mg total) by mouth 3 (three) times daily. Take one  tablet by mouth at 1pm, 2pm, and 9pm the day before surgery 06/06/23   Mavis Anes, MD  montelukast  (SINGULAIR ) 10 MG tablet Take 10 mg by mouth at bedtime.    [provider]  Multiple Vitamins-Minerals (ICAPS AREDS 2 PO) Take 1 capsule by mouth in the morning.    [provider]  neomycin  (MYCIFRADIN ) 500 MG tablet Take 2 tablets (1,000 mg total) by mouth 3 (three) times daily. Take 2 tablets by mouth at 1pm, 2pm, and 9pm the day before surgery 06/06/23   Mavis Anes, MD  Omega-3 Fatty Acids (FISH OIL)  1000 MG CAPS Take 1,000 mg by mouth in the morning.    [provider]  pantoprazole  (PROTONIX ) 40 MG tablet Take 1 tablet (40 mg total) by mouth daily. 06/13/23 06/12/24  Cinderella Deatrice FALCON, MD  TESTOSTERONE  COMPOUNDING KIT TD Apply 4 Pump topically every evening. 10% compounded gel from [facility] (4 clicks)    [provider]  triazolam  (HALCION ) 0.25 MG tablet Take 0.25 mg by mouth at bedtime.     [provider]      Allergies    Bee pollen, Plaquenil  [hydroxychloroquine  sulfate], and Lodine [etodolac]    Review of Systems   Review of Systems  All other systems reviewed and are negative.   Physical Exam Updated Vital Signs BP 131/77   Pulse 79   Temp 97.7 F (36.5 C)   Resp 19   SpO2 95%  Physical Exam Vitals and nursing note reviewed.   78 year old male, resting comfortably and in no acute distress. Vital signs are normal. Oxygen  saturation is 95%, which is normal. Head is normocephalic and atraumatic. PERRLA, EOMI.  Lungs are clear without rales, wheezes, or rhonchi. Chest is nontender. Heart has regular rate and rhythm without murmur. Abdomen is soft, flat, nontender.  Surgical scar present in the lower abdomen with staples in place, no evidence of infection.  Bladder is not distended. Rectal: Normal sphincter tone, stool is light brown and Hemoccult negative.  No blood present. Extremities have no cyanosis or edema. Skin is warm and dry without rash. Neurologic: Mental status is normal, moves all extremities equally.  ED Results / Procedures / Treatments   Labs (all labs ordered are listed, but only abnormal results are displayed) Labs Reviewed  COMPREHENSIVE METABOLIC PANEL - Abnormal; Notable for the following components:      Result Value   Sodium 129 (*)    Chloride 97 (*)    Glucose, Bld 170 (*)    Albumin 3.4 (*)    All other components within normal limits  CBC - Abnormal; Notable for the following components:   WBC  13.2 (*)    Hemoglobin 11.2 (*)    HCT 35.4 (*)    RDW 24.0 (*)    All other components within normal limits  POC OCCULT BLOOD, ED  TYPE AND SCREEN    EKG None  Radiology No results found.  Procedures Procedures    Medications Ordered in ED Medications - No data to display  ED Course/ Medical Decision Making/ A&P                                 Medical Decision Making Amount and/or Complexity of Data Reviewed Labs: ordered.   Report of rectal bleeding at home, no evidence of bleeding on my exam.  No blood in the stool, stool Hemoccult negative, normal vital  signs.  I doubt he actually had any bleeding.  I have reviewed his laboratory test, and my interpretation is mild hyponatremia which is not felt to be clinically significant, elevated random glucose level, mild anemia which is actually improved compared with labs done prior to hospital discharge.  Bladder scan showed no urine in the bladder.  Decreased output is likely secondary to poor oral intake.  Renal function on metabolic panel is normal although BUN has increased compared with this morning.  I have encouraged him to drink plenty of fluids.  Follow-up with his surgeon as scheduled, return to the emergency department if he has new or concerning symptoms.`  Final Clinical Impression(s) / ED Diagnoses Final diagnoses:  Generalized abdominal pain  Hyponatremia  Normochromic normocytic anemia  Elevated random blood glucose level    Rx / DC Orders ED Discharge Orders     None         Raford Lenis, MD 06/25/23 [phone]

## 2023-06-25 NOTE — ED Notes (Signed)
Pt tolerated drinking water.

## 2023-06-25 NOTE — Discharge Instructions (Signed)
 There is no sign that you have lost any blood.  Why you had the diarrhea and abdominal pain is not clear, but there is no evidence of any serious problem.  Please continue your postoperative instructions and follow-up with your surgeon as scheduled.  Please make sure to drink plenty of fluids.  Return to the emergency department if you have any new or concerning symptoms.

## 2023-06-25 NOTE — ED Notes (Signed)
 Pt bladder scanned. found.

## 2023-06-27 ENCOUNTER — Ambulatory Visit: Payer: Medicare HMO | Admitting: Urology

## 2023-06-28 NOTE — Anesthesia Postprocedure Evaluation (Signed)
 Anesthesia Post Note  Patient: Cole Brown  Procedure(s) Performed: PARTIAL COLECTOMY,  repair colovesicle fistula (Abdomen)  Patient location during evaluation: Phase II Anesthesia Type: General Level of consciousness: awake Pain management: pain level controlled Vital Signs Assessment: post-procedure vital signs reviewed and stable Respiratory status: spontaneous breathing and respiratory function stable Cardiovascular status: blood pressure returned to baseline and stable Postop Assessment: no headache and no apparent nausea or vomiting Anesthetic complications: no Comments: Late entry   No notable events documented.   Last Vitals:  Vitals:   06/24/23 0559 06/24/23 0952  BP: 127/75 119/73  Pulse: (!) 59   Resp: 16   Temp: 36.5 C   SpO2: 98%     Last Pain:  Vitals:   06/24/23 1100  TempSrc:   PainSc: 0-No pain                 Yvonna JINNY Bosworth

## 2023-07-02 ENCOUNTER — Encounter: Payer: Self-pay | Admitting: General Surgery

## 2023-07-02 ENCOUNTER — Ambulatory Visit (INDEPENDENT_AMBULATORY_CARE_PROVIDER_SITE_OTHER): Payer: Medicare HMO | Admitting: General Surgery

## 2023-07-02 VITALS — BP 142/76 | HR 54 | Temp 97.5°F | Resp 12 | Ht 71.0 in | Wt 182.0 lb

## 2023-07-02 DIAGNOSIS — Z09 Encounter for follow-up examination after completed treatment for conditions other than malignant neoplasm: Secondary | ICD-10-CM

## 2023-07-02 NOTE — Progress Notes (Signed)
 Subjective:     Cole Brown  Patient here for postoperative visit, status post partial colectomy with repair of colovesical fistula.  He has been doing well.  He initially had some nausea which required an ER visit.  This occurred within 24 hours of his discharge.  He has since been doing well.  His bowel movements have been regular.  He denies any nausea or vomiting.  He does have a Foley catheter in place and the urine has been clear. Objective:    BP (!) 142/76   Pulse (!) 54   Temp (!) 97.5 F (36.4 C) (Oral)   Resp 12   Ht 5' 11 (1.803 m)   Wt 182 lb (82.6 kg)   SpO2 94%   BMI 25.38 kg/m   General:  alert, cooperative, and no distress  Abdomen soft, incision healing well.  Staples removed, Steri-Strips applied. Final pathology has already been discussed with the patient.     Assessment:    Doing well postoperatively. Foley catheter in place, status post colovesical fistula repair    Plan:  Patient will have a cystogram done on 07/05/2023.  Further management is pending those results.  Patient may increase activity as able, but should avoid any heavy lifting or abdominal straining for another 4 to 6 weeks.

## 2023-07-03 ENCOUNTER — Other Ambulatory Visit: Payer: Self-pay

## 2023-07-03 DIAGNOSIS — D649 Anemia, unspecified: Secondary | ICD-10-CM

## 2023-07-03 DIAGNOSIS — D500 Iron deficiency anemia secondary to blood loss (chronic): Secondary | ICD-10-CM

## 2023-07-03 NOTE — Telephone Encounter (Signed)
 Pt contacted. Pt wife states that pt has still not completed recovery from surgery on 06/21/23. Pt also is dealing with a catheter. Pt wife states lets hold off on any poking until after he is recovered. Advised pt/pt wife to give us  a call when ready to schedule.

## 2023-07-04 ENCOUNTER — Inpatient Hospital Stay: Payer: Medicare HMO | Attending: Hematology

## 2023-07-04 DIAGNOSIS — D500 Iron deficiency anemia secondary to blood loss (chronic): Secondary | ICD-10-CM | POA: Insufficient documentation

## 2023-07-04 DIAGNOSIS — K922 Gastrointestinal hemorrhage, unspecified: Secondary | ICD-10-CM | POA: Insufficient documentation

## 2023-07-04 DIAGNOSIS — D649 Anemia, unspecified: Secondary | ICD-10-CM

## 2023-07-04 LAB — CBC WITH DIFFERENTIAL/PLATELET
Abs Immature Granulocytes: 0.02 10*3/uL (ref 0.00–0.07)
Basophils Absolute: 0.1 10*3/uL (ref 0.0–0.1)
Basophils Relative: 2 %
Eosinophils Absolute: 0.2 10*3/uL (ref 0.0–0.5)
Eosinophils Relative: 3 %
HCT: 34.9 % — ABNORMAL LOW (ref 39.0–52.0)
Hemoglobin: 10.8 g/dL — ABNORMAL LOW (ref 13.0–17.0)
Immature Granulocytes: 0 %
Lymphocytes Relative: 23 %
Lymphs Abs: 1.7 10*3/uL (ref 0.7–4.0)
MCH: 26.1 pg (ref 26.0–34.0)
MCHC: 30.9 g/dL (ref 30.0–36.0)
MCV: 84.3 fL (ref 80.0–100.0)
Monocytes Absolute: 0.5 10*3/uL (ref 0.1–1.0)
Monocytes Relative: 7 %
Neutro Abs: 4.9 10*3/uL (ref 1.7–7.7)
Neutrophils Relative %: 65 %
Platelets: 299 10*3/uL (ref 150–400)
RBC: 4.14 MIL/uL — ABNORMAL LOW (ref 4.22–5.81)
RDW: 23.8 % — ABNORMAL HIGH (ref 11.5–15.5)
Smear Review: ADEQUATE
WBC: 7.3 10*3/uL (ref 4.0–10.5)
nRBC: 0 % (ref 0.0–0.2)

## 2023-07-04 LAB — VITAMIN B12: Vitamin B-12: 464 pg/mL (ref 180–914)

## 2023-07-04 LAB — IRON AND TIBC
Iron: 47 ug/dL (ref 45–182)
Saturation Ratios: 14 % — ABNORMAL LOW (ref 17.9–39.5)
TIBC: 329 ug/dL (ref 250–450)
UIBC: 282 ug/dL

## 2023-07-04 LAB — FERRITIN: Ferritin: 183 ng/mL (ref 24–336)

## 2023-07-05 ENCOUNTER — Ambulatory Visit (HOSPITAL_COMMUNITY)
Admission: RE | Admit: 2023-07-05 | Discharge: 2023-07-05 | Disposition: A | Discharge status: Home / Self Care | Payer: Medicare HMO | Source: Ambulatory Visit | Attending: General Surgery | Admitting: General Surgery

## 2023-07-05 DIAGNOSIS — N321 Vesicointestinal fistula: Secondary | ICD-10-CM | POA: Insufficient documentation

## 2023-07-05 MED ORDER — IOTHALAMATE MEGLUMINE 17.2 % UR SOLN
300.0000 mL | Freq: Once | URETHRAL | Status: AC | PRN
  Administered 2023-07-05: 300 mL via INTRAVESICAL

## 2023-07-05 NOTE — Progress Notes (Signed)
Patient tolerated cystogram well today. Dr. Lovell Sheehan present in exam room and reviewed imaging and gave verbal order to d/c foley catheter. Foley removed without difficulty and patient verbalized understanding of post procedure instructions and ambulatory at departure with no acute distress noted.

## 2023-07-09 NOTE — Progress Notes (Deleted)
 Name: Cole Brown DOB: 11-28-1945 MRN: [id number]  History of Present Illness: Mr. Manoukian is a 78 y.o. male who presents today as a new patient at [facility]. All available relevant medical records have been reviewed. He is accompanied by his ***wife, who assists with providing history due to patient's mild dementia. - GU History: 1. 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   2. Recurrent UTIs. Secondary to #1 3. Kidney stone(s). No stones seen on CT stone study done 06/01/2023.  Urine culture results in past 12 months: - 02/12/2023: Positive for Enterococcus faecalis - 03/10/2023: Positive for Klebsiella pneumoniae & Enterococcus faecalis - 05/10/2023: Positive for Klebsiella pneumoniae & E. Coli  - 05/24/2023: Positive for Klebsiella pneumoniae  Recent history:  > 06/21/2023: Underwent partial colectomy and repair of colovesical fistula by Dr. Lovell Sheehan.  > 07/05/2023: Cystogram was negative. No evidence of fistula or extravasation.   Today: He reports the catheter is draining ***.  He {Actions; denies-reports:120008} gross hematuria.  He {Actions; denies-reports:120008} flank pain or abdominal pain. He {Actions; denies-reports:120008} fevers, nausea, or vomiting.   Fall Screening: Do you usually have a device to assist in your mobility? {yes/no:20286} ***cane / ***walker / ***wheelchair  Medications: Current Outpatient Medications  Medication Sig Dispense Refill   acetaminophen (TYLENOL) 500 MG tablet Take 1,000 mg by mouth every 6 (six) hours as needed (pain.).     amLODipine (NORVASC) 5 MG tablet Take 1 tablet (5 mg total) by mouth daily. 90 tablet 3   atenolol (TENORMIN) 25 MG tablet TAKE (1/2) TABLET BY MOUTH ONCE DAILY. (Patient taking differently: Take 12.5 mg by mouth every evening.) 45 tablet 2   azelastine (ASTELIN) 0.1 % nasal spray Place 1 spray into both nostrils 2 (two) times daily as needed for allergies.     Cholecalciferol (VITAMIN D) 50  MCG (2000 UT) CAPS Take 2,000 Units by mouth daily.     cyanocobalamin (VITAMIN B12) 1000 MCG tablet Take 1,000 mcg by mouth daily.     diphenhydrAMINE (BENADRYL) 25 MG tablet Take 25 mg by mouth daily as needed for allergies.     diphenhydrAMINE-zinc acetate (BENADRYL) cream Apply topically daily.     EPINEPHrine 0.3 mg/0.3 mL IJ SOAJ injection Inject 0.3 mg into the muscle as needed for anaphylaxis.     faricimab-svoa (VABYSMO) 6 MG/0.05ML SOSY intravitreal injection 6 mg by Intravitreal route every 30 (thirty) days.     FLUoxetine (PROZAC) 10 MG tablet Take 10 mg by mouth in the morning.     fluticasone (FLONASE) 50 MCG/ACT nasal spray Place 1-2 sprays into both nostrils daily as needed for allergies.     folic acid (FOLVITE) 1 MG tablet TAKE 2 TABLETS BY MOUTH DAILY. (Patient taking differently: Take 2 mg by mouth daily.) 180 tablet 3   furosemide (LASIX) 20 MG tablet Take 20 mg by mouth every other day. In the morning.     HYDROcodone-acetaminophen (NORCO) 5-325 MG tablet Take 1 tablet by mouth every 6 (six) hours as needed for moderate pain (pain score 4-6). 20 tablet 0   hydrocortisone 1 % ointment Apply 1 Application topically daily.     Investigational - Study Medication Take 40 mg by mouth in the morning. Atorvastatin study med. PREVENTABLE trial     levocetirizine (XYZAL) 5 MG tablet Take 5 mg by mouth every evening.      montelukast (SINGULAIR) 10 MG tablet Take 10 mg by mouth at bedtime.     Multiple Vitamins-Minerals (ICAPS AREDS 2 PO)  Take 1 capsule by mouth in the morning.     Omega-3 Fatty Acids (FISH OIL) 1000 MG CAPS Take 1,000 mg by mouth in the morning.     pantoprazole (PROTONIX) 40 MG tablet Take 1 tablet (40 mg total) by mouth daily. 90 tablet 3   TESTOSTERONE COMPOUNDING KIT TD Apply 4 Pump topically every evening. 10% compounded gel from [facility] (4 clicks)     triazolam (HALCION) 0.25 MG tablet Take 0.25 mg by mouth at bedtime.      No current  facility-administered medications for this visit.    Allergies: Allergies  Allergen Reactions   Bee Pollen     Seasonal allergies   Plaquenil [Hydroxychloroquine Sulfate]     rash   Lodine [Etodolac] Swelling and Rash    Past Medical History:  Diagnosis Date   Anemia    Diverticula of colon    2016 colonoscopy   Fibromyalgia    Fibromyalgia    History of GI diverticular bleed    Hypercholesteremia    Hypertension    Multiple gastric ulcers    Sleep apnea    Past Surgical History:  Procedure Laterality Date   allergy shots  weekly   BIOPSY  02/15/2023   Procedure: BIOPSY;  Surgeon: Dolores Frame, MD;  Location: [facility];  Service: Gastroenterology;;   CHOLECYSTECTOMY     COLONOSCOPY     Dr.Rehman   COLONOSCOPY N/A 01/27/2015   Rehman: multiple diverticula at sigmoid colon,ext hemorrhoids   COLONOSCOPY WITH PROPOFOL N/A 02/15/2023   Procedure: COLONOSCOPY WITH PROPOFOL;  Surgeon: Dolores Frame, MD;  Location: [facility];  Service: Gastroenterology;  Laterality: N/A;   ESOPHAGOGASTRODUODENOSCOPY (EGD) WITH PROPOFOL N/A 02/15/2023   Procedure: ESOPHAGOGASTRODUODENOSCOPY (EGD) WITH PROPOFOL;  Surgeon: Dolores Frame, MD;  Location: [facility];  Service: Gastroenterology;  Laterality: N/A;   EYE SURGERY Bilateral    partial cornea transplants    FLEXIBLE SIGMOIDOSCOPY  03/14/2023   Procedure: FLEXIBLE SIGMOIDOSCOPY;  Surgeon: Franky Macho, MD;  Location: [facility];  Service: Endoscopy;;   GALLBLADDER SURGERY  12/1993   HOT HEMOSTASIS  02/15/2023   Procedure: HOT HEMOSTASIS (ARGON PLASMA COAGULATION/BICAP);  Surgeon: [name], [name], MD;  Location: [facility];  Service: Gastroenterology;;   PARTIAL COLECTOMY N/A 06/21/2023   Procedure: PARTIAL COLECTOMY,  repair colovesicle fistula;  Surgeon: Franky Macho, MD;  Location: [facility];  Service: General;  Laterality: N/A;   SCLEROTHERAPY  02/15/2023   Procedure:  Susa Day;  Surgeon: [name], [name], MD;  Location: [facility];  Service: Gastroenterology;;   UPPER GASTROINTESTINAL ENDOSCOPY     Family History  Problem Relation Age of Onset   Heart disease Mother    Pancreatic cancer Mother    Prostate cancer Father    Healthy Sister    Parkinson's disease Brother    Asthma Sister    Healthy Sister    Social History   Socioeconomic History   Marital status: Married    Spouse name: Not on file   Number of children: Not on file   Years of education: Not on file   Highest education level: Not on file  Occupational History   Not on file  Tobacco Use   Smoking status: Never    Passive exposure: Never   Smokeless tobacco: Never  Vaping Use   Vaping status: Never Used  Substance and Sexual Activity   Alcohol use: Not Currently   Drug use: No   Sexual activity:  Not on file  Other Topics Concern   Not on file  Social History Narrative   Not on file   Social Drivers of Health   Financial Resource Strain: Not on file  Food Insecurity: No Food Insecurity (06/22/2023)   Hunger Vital Sign    Worried About Running Out of Food in the Last Year: Never true    Ran Out of Food in the Last Year: Never true  Transportation Needs: No Transportation Needs (06/22/2023)   PRAPARE - Administrator, Civil Service (Medical): No    Lack of Transportation (Non-Medical): No  Physical Activity: Not on file  Stress: Not on file  Social Connections: Socially Isolated (06/22/2023)   Social Connection and Isolation Panel [NHANES]    Frequency of Communication with Friends and Family: Never    Frequency of Social Gatherings with Friends and Family: Never    Attends Religious Services: Never    Database administrator or Organizations: No    Attends Banker Meetings: Never    Marital Status: Married  Catering manager Violence: Not At Risk (06/22/2023)   Humiliation, Afraid, Rape, and Kick questionnaire    Fear of Current  or Ex-Partner: No    Emotionally Abused: No    Physically Abused: No    Sexually Abused: No    SUBJECTIVE  Review of Systems Constitutional: Patient denies any unintentional weight loss or change in strength lntegumentary: Patient denies any rashes or pruritus Cardiovascular: Patient denies chest pain or syncope Respiratory: Patient denies shortness of breath Gastrointestinal: Patient ***denies nausea, vomiting, constipation, or diarrhea Musculoskeletal: Patient denies muscle cramps or weakness Neurologic: Patient denies convulsions or seizures Allergic/Immunologic: Patient denies recent allergic reaction(s) Hematologic/Lymphatic: Patient denies bleeding tendencies Endocrine: Patient denies heat/cold intolerance  GU: As per HPI.  OBJECTIVE There were no vitals filed for this visit. There is no height or weight on file to calculate BMI.  Physical Examination Constitutional: No obvious distress; patient is non-toxic appearing  Cardiovascular: No visible lower extremity edema.  Respiratory: The patient does not have audible wheezing/stridor; respirations do not appear labored  Gastrointestinal: Abdomen non-distended Musculoskeletal: Normal ROM of UEs  Skin: No obvious rashes/open sores  Neurologic: CN 2-12 grossly intact Psychiatric: Answered questions appropriately with normal affect  Hematologic/Lymphatic/Immunologic: No obvious bruises or sites of spontaneous bleeding  UA: ***negative / *** WBC/hpf, *** RBC/hpf, *** bacteria ***Urine microscopy: ***negative / *** WBC/hpf, *** RBC/hpf, *** bacteria ***with no evidence of UTI ***with no evidence of microscopic hematuria ***otherwise unremarkable  PVR: *** ml  ASSESSMENT No diagnosis found. ***  We agreed to plan for follow up in *** months or sooner if needed. Patient verbalized understanding of and agreement with current plan. All questions were answered.  PLAN Advised the following: *** ***No follow-ups on  file.  No orders of the defined types were placed in this encounter.   It has been explained that the patient is to follow regularly with their PCP in addition to all other providers involved in their care and to follow instructions provided by these respective offices. Patient advised to contact urology clinic if any urologic-pertaining questions, concerns, new symptoms or problems arise in the interim period.  There are no Patient Instructions on file for this visit.  Electronically signed by:  Donnita Falls, MSN, FNP-C, CUNP 07/09/2023 2:24 PM

## 2023-07-10 ENCOUNTER — Encounter: Payer: Self-pay | Admitting: Neurology

## 2023-07-10 ENCOUNTER — Ambulatory Visit: Payer: Medicare HMO | Admitting: Neurology

## 2023-07-10 ENCOUNTER — Telehealth: Payer: Self-pay

## 2023-07-10 VITALS — BP 118/70 | HR 60 | Ht 71.0 in | Wt 176.0 lb

## 2023-07-10 DIAGNOSIS — D500 Iron deficiency anemia secondary to blood loss (chronic): Secondary | ICD-10-CM

## 2023-07-10 DIAGNOSIS — G3184 Mild cognitive impairment, so stated: Secondary | ICD-10-CM | POA: Diagnosis not present

## 2023-07-10 DIAGNOSIS — M797 Fibromyalgia: Secondary | ICD-10-CM

## 2023-07-10 NOTE — Patient Instructions (Addendum)
Continue current medications  Continue to follow up with your providers Please discuss with your prescribing doctor continuing Fluoxetine or switching to Duloxetine since you have a diagnosis of Fibromyalgia Encourage patient to exercise daily  Return as needed    There are well-accepted and sensible ways to reduce risk for Alzheimers disease and other degenerative brain disorders .  Exercise Daily Walk A daily 20 minute walk should be part of your routine. Disease related apathy can be a significant roadblock to exercise and the only way to overcome this is to make it a daily routine and perhaps have a reward at the end (something your loved one loves to eat or drink perhaps) or a personal trainer coming to the home can also be very useful. Most importantly, the patient is much more likely to exercise if the caregiver / spouse does it with him/her. In general a structured, repetitive schedule is best.  General Health: Any diseases which effect your body will effect your brain such as a pneumonia, urinary infection, blood clot, heart attack or stroke. Keep contact with your primary care doctor for regular follow ups.  Sleep. A good nights sleep is healthy for the brain. Seven hours is recommended. If you have insomnia or poor sleep habits we can give you some instructions. If you have sleep apnea wear your mask.  Diet: Eating a heart healthy diet is also a good idea; fish and poultry instead of red meat, nuts (mostly non-peanuts), vegetables, fruits, olive oil or canola oil (instead of butter), minimal salt (use other spices to flavor foods), whole grain rice, bread, cereal and pasta and wine in moderation.Research is now showing that the MIND diet, which is a combination of The Mediterranean diet and the DASH diet, is beneficial for cognitive processing and longevity. Information about this diet can be found in The MIND Diet, a book by Alonna Minium, MS, RDN, and online at  [employer]  Finances, Power of [address] and Advance Directives: You should consider putting legal safeguards in place with regard to financial and medical decision making. While the spouse always has power of attorney for medical and financial issues in the absence of any form, you should consider what you want in case the spouse / caregiver is no longer around or capable of making decisions.

## 2023-07-10 NOTE — Progress Notes (Signed)
[facility]  PATIENT: Cole Brown DOB: 1945/11/21  REQUESTING CLINICIAN: Elfredia Nevins, MD HISTORY FROM: Patient and spouse  REASON FOR VISIT: Memory loss    HISTORICAL  CHIEF COMPLAINT:  Chief Complaint  Patient presents with   New Patient (Initial Visit)    [name], here with wife Cole Brown Pt is referred by Dr Sherwood Gambler for memory concerns. Pt's wife states pt has brain fog, change in personality and states pt also has fibromyalgia, no concerns with vertigo.     HISTORY OF PRESENT ILLNESS:  This is 78 year old gentleman with past medical history including multiple gastric ulcers, severe anemia, hypertension, sleep apnea on CPAP and fibromyalgia who is presenting with memory loss.  Wife tells me that his symptom started around end of 2023 and throughout the year 2024.  He did have a lot of confusion, did not go out, lack of motivation, was severely weak and was not getting out of bed.  At that time he was worked up and found to have severe anemia from his gastric ulcer.  Admitted to ICU and transfused up to 5 pounds of blood.  Once his anemia improved, his brain fog also improved.  Wife tells me that he is much better when compared to last year. She tells me that he is not outgoing at as previously but again this is improving.  He is independent in all of his activities of daily living, helps around the house able to drive short distance but last year when he was severely anemic, he got lost twice going to familiar places.    TBI:   No past history of TBI Stroke:   no past history of stroke Seizures:   no past history of seizures Sleep: Sleep apnea, on CPAP Mood: Yes, on Fluoxetine  Family history of Dementia:  Denies  Functional status: independent in all ADLs and IADLs Patient lives with spouse. Cooking: yes Cleaning: no issues  Shopping: no issues  Bathing: no issues  Toileting: no issues  Driving: was having difficulty in 2024 when experiencing  severe anemia but no issues since then  Bills: no issues  Medications: no issues  Ever left the stove on by accident?: Denies  Forget how to use items around the house?: Denies  Getting lost going to familiar places?: Not any more Forgetting loved ones names?: Denies Word finding difficulty? Denies  Sleep: Good with sleep aid    OTHER MEDICAL CONDITIONS: Severe anemia, gastric ulcers, hypertension, fibromyalgia, Sleep apnea    REVIEW OF SYSTEMS: Full 14 system review of systems performed and negative with exception of: As noted in the HPI  ALLERGIES: Allergies  Allergen Reactions   Bee Pollen     Seasonal allergies   Plaquenil [Hydroxychloroquine Sulfate]     rash   Lodine [Etodolac] Swelling and Rash    HOME MEDICATIONS: Outpatient Medications Prior to Visit  Medication Sig Dispense Refill   acetaminophen (TYLENOL) 500 MG tablet Take 1,000 mg by mouth every 6 (six) hours as needed (pain.).     amLODipine (NORVASC) 5 MG tablet Take 1 tablet (5 mg total) by mouth daily. 90 tablet 3   atenolol (TENORMIN) 25 MG tablet TAKE (1/2) TABLET BY MOUTH ONCE DAILY. (Patient taking differently: Take 12.5 mg by mouth every evening.) 45 tablet 2   azelastine (ASTELIN) 0.1 % nasal spray Place 1 spray into both nostrils 2 (two) times daily as needed for allergies.     Cholecalciferol (VITAMIN D) 50 MCG (2000 UT) CAPS  Take 2,000 Units by mouth daily.     cyanocobalamin (VITAMIN B12) 1000 MCG tablet Take 1,000 mcg by mouth daily.     diphenhydrAMINE (BENADRYL) 25 MG tablet Take 25 mg by mouth daily as needed for allergies.     diphenhydrAMINE-zinc acetate (BENADRYL) cream Apply topically daily.     EPINEPHrine 0.3 mg/0.3 mL IJ SOAJ injection Inject 0.3 mg into the muscle as needed for anaphylaxis.     faricimab-svoa (VABYSMO) 6 MG/0.05ML SOSY intravitreal injection 6 mg by Intravitreal route every 30 (thirty) days.     fluticasone (FLONASE) 50 MCG/ACT nasal spray Place 1-2 sprays into both  nostrils daily as needed for allergies.     folic acid (FOLVITE) 1 MG tablet TAKE 2 TABLETS BY MOUTH DAILY. (Patient taking differently: Take 2 mg by mouth daily.) 180 tablet 3   furosemide (LASIX) 20 MG tablet Take 20 mg by mouth every other day. In the morning.     HYDROcodone-acetaminophen (NORCO) 5-325 MG tablet Take 1 tablet by mouth every 6 (six) hours as needed for moderate pain (pain score 4-6). 20 tablet 0   hydrocortisone 1 % ointment Apply 1 Application topically daily.     Investigational - Study Medication Take 40 mg by mouth in the morning. Atorvastatin study med. PREVENTABLE trial     levocetirizine (XYZAL) 5 MG tablet Take 5 mg by mouth every evening.      montelukast (SINGULAIR) 10 MG tablet Take 10 mg by mouth at bedtime.     Multiple Vitamins-Minerals (ICAPS AREDS 2 PO) Take 1 capsule by mouth in the morning.     Omega-3 Fatty Acids (FISH OIL) 1000 MG CAPS Take 1,000 mg by mouth in the morning.     pantoprazole (PROTONIX) 40 MG tablet Take 1 tablet (40 mg total) by mouth daily. 90 tablet 3   TESTOSTERONE COMPOUNDING KIT TD Apply 4 Pump topically every evening. 10% compounded gel from [facility] (4 clicks)     triazolam (HALCION) 0.25 MG tablet Take 0.25 mg by mouth at bedtime.      FLUoxetine (PROZAC) 10 MG tablet Take 10 mg by mouth in the morning. (Patient not taking: Reported on 07/10/2023)     No facility-administered medications prior to visit.    PAST MEDICAL HISTORY: Past Medical History:  Diagnosis Date   Anemia    Diverticula of colon    2016 colonoscopy   Fibromyalgia    Fibromyalgia    History of GI diverticular bleed    Hypercholesteremia    Hypertension    Multiple gastric ulcers    Sleep apnea     PAST SURGICAL HISTORY: Past Surgical History:  Procedure Laterality Date   allergy shots  weekly   BIOPSY  02/15/2023   Procedure: BIOPSY;  Surgeon: Dolores Frame, MD;  Location: [facility];  Service: Gastroenterology;;    CHOLECYSTECTOMY     COLONOSCOPY     Dr.Rehman   COLONOSCOPY N/A 01/27/2015   Rehman: multiple diverticula at sigmoid colon,ext hemorrhoids   COLONOSCOPY WITH PROPOFOL N/A 02/15/2023   Procedure: COLONOSCOPY WITH PROPOFOL;  Surgeon: Dolores Frame, MD;  Location: [facility];  Service: Gastroenterology;  Laterality: N/A;   ESOPHAGOGASTRODUODENOSCOPY (EGD) WITH PROPOFOL N/A 02/15/2023   Procedure: ESOPHAGOGASTRODUODENOSCOPY (EGD) WITH PROPOFOL;  Surgeon: Dolores Frame, MD;  Location: [facility];  Service: Gastroenterology;  Laterality: N/A;   EYE SURGERY Bilateral    partial cornea transplants    FLEXIBLE SIGMOIDOSCOPY  03/14/2023   Procedure: FLEXIBLE SIGMOIDOSCOPY;  Surgeon: Franky Macho, MD;  Location: [facility];  Service: Endoscopy;;   GALLBLADDER SURGERY  12/1993   HOT HEMOSTASIS  02/15/2023   Procedure: HOT HEMOSTASIS (ARGON PLASMA COAGULATION/BICAP);  Surgeon: [name], [name], MD;  Location: [facility];  Service: Gastroenterology;;   PARTIAL COLECTOMY N/A 06/21/2023   Procedure: PARTIAL COLECTOMY,  repair colovesicle fistula;  Surgeon: Franky Macho, MD;  Location: [facility];  Service: General;  Laterality: N/A;   SCLEROTHERAPY  02/15/2023   Procedure: Susa Day;  Surgeon: [name], [name], MD;  Location: [facility];  Service: Gastroenterology;;   UPPER GASTROINTESTINAL ENDOSCOPY      FAMILY HISTORY: Family History  Problem Relation Age of Onset   Heart disease Mother    Pancreatic cancer Mother    Prostate cancer Father    Healthy Sister    Parkinson's disease Brother    Asthma Sister    Healthy Sister     SOCIAL HISTORY: Social History   Socioeconomic History   Marital status: Married    Spouse name: Not on file   Number of children: Not on file   Years of education: Not on file   Highest education level: Not on file  Occupational History   Not on file  Tobacco Use   Smoking status: Never    Passive  exposure: Never   Smokeless tobacco: Never  Vaping Use   Vaping status: Never Used  Substance and Sexual Activity   Alcohol use: Not Currently   Drug use: No   Sexual activity: Not on file  Other Topics Concern   Not on file  Social History Narrative   Not on file   Social Drivers of Health   Financial Resource Strain: Not on file  Food Insecurity: No Food Insecurity (06/22/2023)   Hunger Vital Sign    Worried About Running Out of Food in the Last Year: Never true    Ran Out of Food in the Last Year: Never true  Transportation Needs: No Transportation Needs (06/22/2023)   PRAPARE - Administrator, Civil Service (Medical): No    Lack of Transportation (Non-Medical): No  Physical Activity: Not on file  Stress: Not on file  Social Connections: Socially Isolated (06/22/2023)   Social Connection and Isolation Panel [NHANES]    Frequency of Communication with Friends and Family: Never    Frequency of Social Gatherings with Friends and Family: Never    Attends Religious Services: Never    Database administrator or Organizations: No    Attends Banker Meetings: Never    Marital Status: Married  Catering manager Violence: Not At Risk (06/22/2023)   Humiliation, Afraid, Rape, and Kick questionnaire    Fear of Current or Ex-Partner: No    Emotionally Abused: No    Physically Abused: No    Sexually Abused: No    PHYSICAL EXAM  GENERAL EXAM/CONSTITUTIONAL: Vitals:  Vitals:   07/10/23 0820  BP: 118/70  Pulse: 60  Weight: 176 lb (79.8 kg)  Height: 5\' 11"  (1.803 m)   Body mass index is 24.55 kg/m. Wt Readings from Last 3 Encounters:  07/10/23 176 lb (79.8 kg)  07/02/23 182 lb (82.6 kg)  06/21/23 176 lb 12.9 oz (80.2 kg)   Patient is in no distress; well developed, nourished and groomed; neck is supple  MUSCULOSKELETAL: Gait, strength, tone, movements noted in Neurologic exam below  NEUROLOGIC: MENTAL STATUS:      No data to display  awake, alert, oriented to person, place and time recent and remote memory intact normal attention and concentration language fluent, comprehension intact, naming intact fund of knowledge appropriate     07/10/2023    8:27 AM  [facility] Cognitive Assessment   Visuospatial/ Executive (0/5) 4  Naming (0/3) 3  Attention: Read list of digits (0/2) 2  Attention: Read list of letters (0/1) 1  Attention: Serial 7 subtraction starting at 100 (0/3) 3  Language: Repeat phrase (0/2) 2  Language : Fluency (0/1) 1  Abstraction (0/2) 2  Delayed Recall (0/5) 0  Orientation (0/6) 5  Total 23    CRANIAL NERVE:  2nd, 3rd, 4th, 6th- visual fields full to confrontation, extraocular muscles intact, no nystagmus 5th - facial sensation symmetric 7th - facial strength symmetric 8th - hearing intact 9th - palate elevates symmetrically, uvula midline 11th - shoulder shrug symmetric 12th - tongue protrusion midline  MOTOR:  normal bulk and tone, full strength in the BUE, BLE  SENSORY:  normal and symmetric to light touch  COORDINATION:  finger-nose-finger, fine finger movements normal  GAIT/STATION:  normal    DIAGNOSTIC DATA (LABS, IMAGING, TESTING) - I reviewed patient records, labs, notes, testing and imaging myself where available.  Lab Results  Component Value Date   WBC 7.3 07/04/2023   HGB 10.8 (L) 07/04/2023   HCT 34.9 (L) 07/04/2023   MCV 84.3 07/04/2023   PLT 299 07/04/2023      Component Value Date/Time   NA 129 (L) 06/24/2023 2355   NA 134 04/25/2023 1623   K 4.3 06/24/2023 2355   CL 97 (L) 06/24/2023 2355   CO2 24 06/24/2023 2355   GLUCOSE 170 (H) 06/24/2023 2355   BUN 21 06/24/2023 2355   BUN 18 04/25/2023 1623   CREATININE 1.09 06/24/2023 2355   CREATININE 1.16 05/27/2023 1125   CALCIUM 9.0 06/24/2023 2355   PROT 7.0 06/24/2023 2355   PROT 6.3 04/25/2023 1623   ALBUMIN 3.4 (L) 06/24/2023 2355   ALBUMIN 3.0 (L) 04/25/2023 1623   AST 20 06/24/2023 2355    ALT 26 06/24/2023 2355   ALKPHOS 94 06/24/2023 2355   BILITOT 0.8 06/24/2023 2355   BILITOT 0.3 04/25/2023 1623   GFRNONAA >60 06/24/2023 2355   GFRNONAA 76 03/22/2020 0946   GFRAA 89 03/22/2020 0946   No results found for: "CHOL", "HDL", "LDLCALC", "LDLDIRECT", "TRIG", "CHOLHDL" No results found for: "HGBA1C" Lab Results  Component Value Date   VITAMINB12 464 07/04/2023   Lab Results  Component Value Date   TSH 1.662 02/12/2023    CT Head 02/12/2023 No acute CT finding. Age related volume loss. 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    ASSESSMENT AND PLAN  78 y.o. year old male with multiple gastric ulcers, severe anemia, hypertension, sleep apnea on CPAP and fibromyalgia who is presenting with memory loss.  Memory loss is improving, his symptoms started last year in the setting of severe anemia.  Again once his anemia resolved, his brain fog and memory loss improved.  I have informed patient and spouse that his mild cognitive impairment is likely due to a medical condition.  Advised him to continue following up with his PCP and if his symptoms get worse, to contact me, at that time we will do the workup for dementia. In term of his lack of motivation, not going out, fibromyalgia, he is already taking fluoxetine.  I have advised them to discussed with the prescribing doctor regarding switching the Fluoxetine to  Duloxetine.  They voiced understanding.  Continue to follow with your doctors return as needed.   1. Mild cognitive impairment   2. Iron deficiency anemia due to chronic blood loss   3. Fibromyalgia      Patient Instructions  Continue current medications  Continue to follow up with your providers Please discuss with your prescribing doctor continuing Fluoxetine or switching to Duloxetine since you have a diagnosis of Fibromyalgia Encourage patient to exercise daily  Return as needed    There are well-accepted and sensible ways to reduce risk for  Alzheimers disease and other degenerative brain disorders .  Exercise Daily Walk A daily 20 minute walk should be part of your routine. Disease related apathy can be a significant roadblock to exercise and the only way to overcome this is to make it a daily routine and perhaps have a reward at the end (something your loved one loves to eat or drink perhaps) or a personal trainer coming to the home can also be very useful. Most importantly, the patient is much more likely to exercise if the caregiver / spouse does it with him/her. In general a structured, repetitive schedule is best.  General Health: Any diseases which effect your body will effect your brain such as a pneumonia, urinary infection, blood clot, heart attack or stroke. Keep contact with your primary care doctor for regular follow ups.  Sleep. A good nights sleep is healthy for the brain. Seven hours is recommended. If you have insomnia or poor sleep habits we can give you some instructions. If you have sleep apnea wear your mask.  Diet: Eating a heart healthy diet is also a good idea; fish and poultry instead of red meat, nuts (mostly non-peanuts), vegetables, fruits, olive oil or canola oil (instead of butter), minimal salt (use other spices to flavor foods), whole grain rice, bread, cereal and pasta and wine in moderation.Research is now showing that the MIND diet, which is a combination of The Mediterranean diet and the DASH diet, is beneficial for cognitive processing and longevity. Information about this diet can be found in The MIND Diet, a book by Alonna Minium, MS, RDN, and online at [employer]  Finances, Power of [phone] and Advance Directives: You should consider putting legal safeguards in place with regard to financial and medical decision making. While the spouse always has power of attorney for medical and financial issues in the absence of any form, you should consider what you want in case the  spouse / caregiver is no longer around or capable of making decisions.   No orders of the defined types were placed in this encounter.   No orders of the defined types were placed in this encounter.   Return if symptoms worsen or fail to improve.    Windell Norfolk, MD 07/10/2023, 2:49 PM  [facility] [address], [address] [place], [place] [address] [phone]

## 2023-07-11 ENCOUNTER — Inpatient Hospital Stay: Payer: Medicare HMO | Admitting: Oncology

## 2023-07-11 VITALS — BP 163/83 | HR 58 | Temp 97.0°F | Resp 17 | Ht 71.0 in | Wt 175.0 lb

## 2023-07-11 DIAGNOSIS — D649 Anemia, unspecified: Secondary | ICD-10-CM

## 2023-07-11 DIAGNOSIS — K922 Gastrointestinal hemorrhage, unspecified: Secondary | ICD-10-CM | POA: Diagnosis not present

## 2023-07-11 DIAGNOSIS — D500 Iron deficiency anemia secondary to blood loss (chronic): Secondary | ICD-10-CM

## 2023-07-11 DIAGNOSIS — R760 Raised antibody titer: Secondary | ICD-10-CM | POA: Diagnosis not present

## 2023-07-11 DIAGNOSIS — F419 Anxiety disorder, unspecified: Secondary | ICD-10-CM | POA: Diagnosis not present

## 2023-07-11 DIAGNOSIS — Z6824 Body mass index (BMI) 24.0-24.9, adult: Secondary | ICD-10-CM | POA: Diagnosis not present

## 2023-07-11 DIAGNOSIS — M797 Fibromyalgia: Secondary | ICD-10-CM | POA: Diagnosis not present

## 2023-07-11 NOTE — Progress Notes (Signed)
[facility] [address] [place] [address]    Clinic Day:  07/11/2023  Referring physician: Assunta Found, MD  Patient Care Team: Assunta Found, MD as PCP - General (Family Medicine) Wyline Mood Dorothe Pea, MD as PCP - Cardiology (Cardiology) [name], NP as Nurse Practitioner (Cardiology) Franky Macho, MD as Consulting Physician (General Surgery)   ASSESSMENT & PLAN:   Assessment: 1.  Normocytic anemia from blood loss: - Severe anemia since July 2024 requiring blood transfusions.  Last admission on 02/12/2023 for GI hemorrhage with acute blood loss anemia.  EGD on 02/15/2023 with multiple ulcers seen in the stomach and duodenum, s/p cauterization and clipping.  He has received 5 units PRBC. - Sigmoidoscopy on 02/15/2023 due to inadequate preparation. 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  Stricture was not able to be traversed with changing 3 scopes, pediatric, ultraslim and spaghetti upper scope.  Malignancy not excluded.  Nonbleeding external and internal hemorrhoids. - Still has intermittent bleeding per rectum per wife. - NGS (05/01/2023): SRSF2 - Serum EPO: 53.6   2.  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  Lupus anticoagulant negative.  Antibeta 2 glycoprotein 1 IgM antibody more than 150 on many occasions.  Anticardiolipin IgM improved to normal.  He was told to continue aspirin 325 mg daily for primary prophylaxis.   3.  Social/family history: -Lives at home with his wife.  Independent of ADLs and IADLs.  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  Non-smoker. - Mother had pancreatic cancer.  Father had colon cancer.  No family history of antiphospholipid syndrome or DVTs.  Plan:  1.  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 - Workup from 05/01/2023- Hemolysis workup was negative.  Immunofixation and free light chains were negative. - Hemoglobin today is 10.8.  Ferritin 183.  Iron saturations 14%.  TIBC 329. -We discussed an additional dose of IV iron given iron sats of 14% and hemoglobin is still mildly low.  They were in agreement.  We also discussed starting oral iron supplements. -Recommend ferrous gluconate or ferrous sulfate based on tolerance.  Recommend taking a stool softener.  May start every other day to see if he can tolerate.  If unable to tolerate, would recommend stopping. -Return to clinic in approximately 3 months with labs a few days before.   2.  B12 deficiency: -Lab work from 07/04/2023 shows a vitamin D level of 464.  He is currently taking vitamin B supplements at home. -Will recheck this plus MMA in approximately 3 to 4 months.   No orders of the defined types were placed in this encounter.    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    Prior Therapy: INFeD  Current Therapy: INFeD   HISTORY OF PRESENT ILLNESS:   Oncology History   No history exists.     INTERVAL HISTORY:   Pinkney is a 78 y.o. male presenting to clinic today for follow up of iron deficiency anemia. He was last seen by Dr. Ellin Saba on 05/29/2023.  In the interim, he was evaluated for flank pain on 06/01/2023 with negative CT renal  stone study.  Urinalysis consistent with UTI was treated with fosfomycin.   Patient had partial colectomy repair of colovesical fistula on 06/21/2023 and was discharged on 06/24/2023 with Dr. Lovell Sheehan.  Postoperative course was unremarkable.  Pathology was negative for malignancy.  He was discharged home with a Foley catheter.  He was evaluated again on 06/25/2023 for generalized abdominal pain,  vomiting and hematochezia.  Hemoglobin was 9.7 on 06/24/2023.  Stool occult was negative for blood.  He is taking Protonix.    Patient had cystogram done on 07/05/2023 which was negative no evidence of fistula or extravasation.  Patient met with neurology on 07/10/2023 for brain fog and change in personality.  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  Today, he states that he is doing well overall. His appetite level is at 100%. His energy level is at 75%.  Wife reports he overall is doing much better.  States he is cold all the time.  Denies any bleeding, hematochezia or melena.  He is not currently taking iron supplements.  Reports he was told to stop taking them at one point and has never restarted.  He is taking B12 supplements.  PAST MEDICAL HISTORY:   Past Medical History: Past Medical History:  Diagnosis Date   Anemia    Diverticula of colon    2016 colonoscopy   Fibromyalgia    Fibromyalgia    History of GI diverticular bleed    Hypercholesteremia    Hypertension    Multiple gastric ulcers    Sleep apnea     Surgical History: Past Surgical History:  Procedure Laterality Date   allergy shots  weekly   BIOPSY  02/15/2023   Procedure: BIOPSY;  Surgeon: Dolores Frame, MD;  [facility]: [facility];  Service: Gastroenterology;;   CHOLECYSTECTOMY     COLONOSCOPY     Dr.Rehman   COLONOSCOPY N/A 01/27/2015   Rehman: multiple diverticula at sigmoid colon,ext hemorrhoids   COLONOSCOPY WITH PROPOFOL N/A 02/15/2023   Procedure: COLONOSCOPY WITH PROPOFOL;  Surgeon: Dolores Frame, MD;  [facility];  Service: Gastroenterology;  Laterality: N/A;   ESOPHAGOGASTRODUODENOSCOPY (EGD) WITH PROPOFOL N/A 02/15/2023   Procedure: ESOPHAGOGASTRODUODENOSCOPY (EGD) WITH PROPOFOL;  Surgeon: Dolores Frame, MD;  [facility]: [facility];  Service: Gastroenterology;  Laterality: N/A;   EYE SURGERY Bilateral    partial cornea  transplants    FLEXIBLE SIGMOIDOSCOPY  03/14/2023   Procedure: FLEXIBLE SIGMOIDOSCOPY;  Surgeon: Franky Macho, MD;  Location: [facility];  Service: Endoscopy;;   GALLBLADDER SURGERY  12/1993   HOT HEMOSTASIS  02/15/2023   Procedure: HOT HEMOSTASIS (ARGON PLASMA COAGULATION/BICAP);  Surgeon: [name], [name], MD;  Location: [facility];  Service: Gastroenterology;;   PARTIAL COLECTOMY N/A 06/21/2023   Procedure: PARTIAL COLECTOMY,  repair colovesicle fistula;  Surgeon: Franky Macho, MD;  Location: [facility];  Service: General;  Laterality: N/A;   SCLEROTHERAPY  02/15/2023   Procedure: Susa Day;  Surgeon: [name], [name], MD;  Location: [facility];  Service: Gastroenterology;;   UPPER GASTROINTESTINAL ENDOSCOPY      Social History: Social History   Socioeconomic History   Marital status: Married    Spouse name: Not on file   Number of children: Not on file   Years of education: Not on file   Highest education level: Not on file  Occupational History   Not on file  Tobacco Use   Smoking status: Never    Passive exposure: Never  Smokeless tobacco: Never  Vaping Use   Vaping status: Never Used  Substance and Sexual Activity   Alcohol use: Not Currently   Drug use: No   Sexual activity: Not on file  Other Topics Concern   Not on file  Social History Narrative   Not on file   Social Drivers of Health   Financial Resource Strain: Not on file  Food Insecurity: No Food Insecurity (06/22/2023)   Hunger Vital Sign    Worried About Running Out of Food in the Last Year: Never true    Ran Out of Food in the Last Year: Never true  Transportation Needs: No Transportation Needs (06/22/2023)   PRAPARE - Administrator, Civil Service (Medical): No    Lack of Transportation (Non-Medical): No  Physical Activity: Not on file  Stress: Not on file  Social Connections: Socially Isolated (06/22/2023)   Social Connection and Isolation Panel [NHANES]     Frequency of Communication with Friends and Family: Never    Frequency of Social Gatherings with Friends and Family: Never    Attends Religious Services: Never    Database administrator or Organizations: No    Attends Banker Meetings: Never    Marital Status: Married  Catering manager Violence: Not At Risk (06/22/2023)   Humiliation, Afraid, Rape, and Kick questionnaire    Fear of Current or Ex-Partner: No    Emotionally Abused: No    Physically Abused: No    Sexually Abused: No    Family History: Family History  Problem Relation Age of Onset   Heart disease Mother    Pancreatic cancer Mother    Prostate cancer Father    Healthy Sister    Parkinson's disease Brother    Asthma Sister    Healthy Sister     Current Medications:  Current Outpatient Medications:    acetaminophen (TYLENOL) 500 MG tablet, Take 1,000 mg by mouth every 6 (six) hours as needed (pain.)., Disp: , Rfl:    amLODipine (NORVASC) 5 MG tablet, Take 1 tablet (5 mg total) by mouth daily., Disp: 90 tablet, Rfl: 3   atenolol (TENORMIN) 25 MG tablet, TAKE (1/2) TABLET BY MOUTH ONCE DAILY. (Patient taking differently: Take 12.5 mg by mouth every evening.), Disp: 45 tablet, Rfl: 2   azelastine (ASTELIN) 0.1 % nasal spray, Place 1 spray into both nostrils 2 (two) times daily as needed for allergies., Disp: , Rfl:    Cholecalciferol (VITAMIN D) 50 MCG (2000 UT) CAPS, Take 2,000 Units by mouth daily., Disp: , Rfl:    cyanocobalamin (VITAMIN B12) 1000 MCG tablet, Take 1,000 mcg by mouth daily., Disp: , Rfl:    diphenhydrAMINE (BENADRYL) 25 MG tablet, Take 25 mg by mouth daily as needed for allergies., Disp: , Rfl:    diphenhydrAMINE-zinc acetate (BENADRYL) cream, Apply topically daily., Disp: , Rfl:    EPINEPHrine 0.3 mg/0.3 mL IJ SOAJ injection, Inject 0.3 mg into the muscle as needed for anaphylaxis., Disp: , Rfl:    faricimab-svoa (VABYSMO) 6 MG/0.05ML SOSY intravitreal injection, 6 mg by Intravitreal route  every 30 (thirty) days., Disp: , Rfl:    FLUoxetine (PROZAC) 10 MG tablet, Take 10 mg by mouth in the morning. (Patient not taking: Reported on 07/10/2023), Disp: , Rfl:    fluticasone (FLONASE) 50 MCG/ACT nasal spray, Place 1-2 sprays into both nostrils daily as needed for allergies., Disp: , Rfl:    folic acid (FOLVITE) 1 MG tablet, TAKE 2 TABLETS BY MOUTH DAILY. (  Patient taking differently: Take 2 mg by mouth daily.), Disp: 180 tablet, Rfl: 3   furosemide (LASIX) 20 MG tablet, Take 20 mg by mouth every other day. In the morning., Disp: , Rfl:    HYDROcodone-acetaminophen (NORCO) 5-325 MG tablet, Take 1 tablet by mouth every 6 (six) hours as needed for moderate pain (pain score 4-6)., Disp: 20 tablet, Rfl: 0   hydrocortisone 1 % ointment, Apply 1 Application topically daily., Disp: , Rfl:    Investigational - Study Medication, Take 40 mg by mouth in the morning. Atorvastatin study med. PREVENTABLE trial, Disp: , Rfl:    levocetirizine (XYZAL) 5 MG tablet, Take 5 mg by mouth every evening. , Disp: , Rfl:    montelukast (SINGULAIR) 10 MG tablet, Take 10 mg by mouth at bedtime., Disp: , Rfl:    Multiple Vitamins-Minerals (ICAPS AREDS 2 PO), Take 1 capsule by mouth in the morning., Disp: , Rfl:    Omega-3 Fatty Acids (FISH OIL) 1000 MG CAPS, Take 1,000 mg by mouth in the morning., Disp: , Rfl:    pantoprazole (PROTONIX) 40 MG tablet, Take 1 tablet (40 mg total) by mouth daily., Disp: 90 tablet, Rfl: 3   TESTOSTERONE COMPOUNDING KIT TD, Apply 4 Pump topically every evening. 10% compounded gel from [facility] (4 clicks), Disp: , Rfl:    triazolam (HALCION) 0.25 MG tablet, Take 0.25 mg by mouth at bedtime. , Disp: , Rfl:    Allergies: Allergies  Allergen Reactions   Bee Pollen     Seasonal allergies   Plaquenil [Hydroxychloroquine Sulfate]     rash   Lodine [Etodolac] Swelling and Rash    REVIEW OF SYSTEMS:   Review of Systems  Constitutional:  Positive for fatigue.  Respiratory:   Negative for cough and shortness of breath.   Gastrointestinal:  Negative for abdominal pain, diarrhea, nausea and vomiting.  Genitourinary:  Negative for dysuria, hematuria and nocturia.   Neurological:        Brain fog  All other systems reviewed and are negative.    VITALS:   There were no vitals taken for this visit.  Wt Readings from Last 3 Encounters:  07/10/23 176 lb (79.8 kg)  07/02/23 182 lb (82.6 kg)  06/21/23 176 lb 12.9 oz (80.2 kg)    There is no height or weight on file to calculate BMI.  Performance status (ECOG): 1 - Symptomatic but completely ambulatory  PHYSICAL EXAM:   Physical Exam Constitutional:      Appearance: Normal appearance.  Cardiovascular:     Rate and Rhythm: Normal rate and regular rhythm.  Pulmonary:     Effort: Pulmonary effort is normal.     Breath sounds: Normal breath sounds.  Abdominal:     General: Bowel sounds are normal.     Palpations: Abdomen is soft.  Musculoskeletal:        General: No swelling. Normal range of motion.  Neurological:     Mental Status: He is alert and oriented to person, place, and time. Mental status is at baseline.     LABS:      Latest Ref Rng & Units 07/04/2023    1:44 PM 06/24/2023   11:55 PM 06/24/2023    5:19 AM  CBC  WBC 4.0 - 10.5 K/uL 7.3  13.2  6.5   Hemoglobin 13.0 - 17.0 g/dL 16.1  09.6  9.7   Hematocrit 39.0 - 52.0 % 34.9  35.4  31.7   Platelets 150 - 400 K/uL 299  258  205       Latest Ref Rng & Units 06/24/2023   11:55 PM 06/24/2023    5:19 AM 06/23/2023    4:35 AM  CMP  Glucose 70 - 99 mg/dL 657  846  88   BUN 8 - 23 mg/dL 21  12  11    Creatinine 0.61 - 1.24 mg/dL 9.62  9.52  8.41   Sodium 135 - 145 mmol/L 129  130  132   Potassium 3.5 - 5.1 mmol/L 4.3  3.7  3.7   Chloride 98 - 111 mmol/L 97  100  101   CO2 22 - 32 mmol/L 24  23  25    Calcium 8.9 - 10.3 mg/dL 9.0  8.6  8.5   Total Protein 6.5 - 8.1 g/dL 7.0     Total Bilirubin 0.0 - 1.2 mg/dL 0.8     Alkaline Phos 38 - 126 U/L 94      AST 15 - 41 U/L 20     ALT 0 - 44 U/L 26        No results found for: "CEA1", "CEA" / No results found for: "CEA1", "CEA" No results found for: "PSA1" No results found for: "CAN199" No results found for: "CAN125"  Lab Results  Component Value Date   TOTALPROTELP 7.0 05/01/2023   TOTALPROTELP 7.0 05/01/2023   ALBUMINELP 3.0 05/01/2023   A1GS 0.3 05/01/2023   A2GS 0.9 05/01/2023   BETS 1.0 05/01/2023   BETA2SER 0.5 08/21/2018   GAMS 1.8 05/01/2023   MSPIKE Not Observed 05/01/2023   SPEI Comment 05/01/2023   Lab Results  Component Value Date   TIBC 329 07/04/2023   TIBC 415 05/01/2023   TIBC 212 (L) 02/12/2023   FERRITIN 183 07/04/2023   FERRITIN 25 05/01/2023   FERRITIN 93 02/26/2023   IRONPCTSAT 14 (L) 07/04/2023   IRONPCTSAT 5 (L) 05/01/2023   IRONPCTSAT 8 (L) 02/12/2023   Lab Results  Component Value Date   LDH 117 05/01/2023     STUDIES:   DG Cystogram Result Date: 07/05/2023 CLINICAL DATA:  Colovesical fistula status post surgical repair. EXAM: CYSTOGRAM TECHNIQUE: After catheterization of the urinary bladder following sterile technique the bladder was filled with 300 mL Cysto-Hypaque 30% by drip infusion. Serial spot images were obtained during bladder filling and post draining. Postvoid images obtained. FLUOROSCOPY: Radiation Exposure Index (as provided by the fluoroscopic device): 18.7 mGy Kerma COMPARISON:  None Available. FINDINGS: Foley catheter retention balloon appropriately positioned. The bladder distends normally. No extravasation or recurrent/residual fistula, on multiple projections. Mild bladder wall trabeculations posteriorly. Minimal post drain residual. The Foley catheter was removed postprocedure. IMPRESSION: Negative.  No evidence of fistula or extravasation. Electronically Signed   By: Corlis Leak M.D.   On: 07/05/2023 12:10

## 2023-07-17 ENCOUNTER — Ambulatory Visit: Payer: Medicare HMO | Admitting: Urology

## 2023-07-17 DIAGNOSIS — Z09 Encounter for follow-up examination after completed treatment for conditions other than malignant neoplasm: Secondary | ICD-10-CM

## 2023-07-17 DIAGNOSIS — N321 Vesicointestinal fistula: Secondary | ICD-10-CM

## 2023-07-17 DIAGNOSIS — Z978 Presence of other specified devices: Secondary | ICD-10-CM

## 2023-07-18 ENCOUNTER — Inpatient Hospital Stay: Payer: Medicare HMO

## 2023-07-18 VITALS — BP 161/83 | HR 57 | Temp 97.1°F | Resp 19 | Wt 174.8 lb

## 2023-07-18 DIAGNOSIS — D500 Iron deficiency anemia secondary to blood loss (chronic): Secondary | ICD-10-CM | POA: Diagnosis not present

## 2023-07-18 DIAGNOSIS — K922 Gastrointestinal hemorrhage, unspecified: Secondary | ICD-10-CM | POA: Diagnosis not present

## 2023-07-18 MED ORDER — CETIRIZINE HCL 10 MG/ML IV SOLN
10.0000 mg | Freq: Once | INTRAVENOUS | Status: AC
Start: 2023-07-18 — End: 2023-07-18
  Administered 2023-07-18: 10 mg via INTRAVENOUS
  Filled 2023-07-18: qty 1

## 2023-07-18 MED ORDER — METHYLPREDNISOLONE SODIUM SUCC 125 MG IJ SOLR
125.0000 mg | Freq: Once | INTRAMUSCULAR | Status: AC
  Administered 2023-07-18: 125 mg via INTRAVENOUS
  Filled 2023-07-18: qty 2

## 2023-07-18 MED ORDER — SODIUM CHLORIDE 0.9 % IV SOLN: INTRAVENOUS | Status: DC

## 2023-07-18 MED ORDER — FAMOTIDINE IN NACL 20-0.9 MG/50ML-% IV SOLN
20.0000 mg | Freq: Once | INTRAVENOUS | Status: AC
  Administered 2023-07-18: 20 mg via INTRAVENOUS
  Filled 2023-07-18: qty 50

## 2023-07-18 MED ORDER — ACETAMINOPHEN 325 MG PO TABS
650.0000 mg | ORAL_TABLET | Freq: Once | ORAL | Status: AC
Start: 2023-07-18 — End: 2023-07-18
  Administered 2023-07-18: 650 mg via ORAL
  Filled 2023-07-18: qty 2

## 2023-07-18 MED ORDER — IRON DEXTRAN 50 MG/ML IJ SOLN
1000.0000 mg | Freq: Once | INTRAMUSCULAR | Status: AC
  Administered 2023-07-18: 1000 mg via INTRAVENOUS
  Filled 2023-07-18: qty 20

## 2023-07-18 NOTE — Progress Notes (Signed)
Patient tolerated iron infusion with no complaints voiced.  Peripheral IV site clean and dry with good blood return noted before and after infusion.  Band aid applied.   VSS with discharge and left in satisfactory condition with no s/s of distress noted.   Cole Brown [employer]

## 2023-07-18 NOTE — Patient Instructions (Signed)
Iron Dextran Injection What is this medication? IRON DEXTRAN (EYE ern DEX tran) treats low levels of iron in your body. Iron is a mineral that plays an important role in making red blood cells, which carry oxygen from your lungs to the rest of your body. This medicine may be used for other purposes; ask your health care provider or pharmacist if you have questions. COMMON BRAND NAME(S): Dexferrum, INFeD What should I tell my care team before I take this medication? They need to know if you have any of these conditions: Anemia not caused by low iron levels Heart disease High levels of iron in the blood Kidney disease Liver disease An unusual or allergic reaction to iron, other medications, foods, dyes, or preservatives Pregnant or trying to get pregnant Breastfeeding How should I use this medication? This medication is injected into a vein or a muscle. It is given by your care team in a hospital or clinic setting. Talk to your care team about the use of this medication in children. While it may be prescribed for children as young as 4 months for selected conditions, precautions do apply. Overdosage: If you think you have taken too much of this medicine contact a poison control center or emergency room at once. NOTE: This medicine is only for you. Do not share this medicine with others. What if I miss a dose? Keep appointments for follow-up doses. It is important not to miss your dose. Call your care team if you are unable to keep an appointment. What may interact with this medication? Do not take this medication with any of the following: Deferoxamine Dimercaprol Other iron products This medication may also interact with the following: Chloramphenicol Deferasirox This list may not describe all possible interactions. Give your health care provider a list of all the medicines, herbs, non-prescription drugs, or dietary supplements you use. Also tell them if you smoke, drink alcohol, or use  illegal drugs. Some items may interact with your medicine. What should I watch for while using this medication? Visit your care team for regular checks on your progress. Tell your care team if your symptoms do not start to get better or if they get worse. You may need blood work while taking this medication. You may need to eat more foods that contain iron. Talk to your care team. Foods that contain iron include whole grains/cereals, dried fruits, beans, peas, leafy green vegetables, and organ meats (liver, kidney). Long-term use of this medication may increase your risk of some cancers. Talk to your care team about your risk of cancer. What side effects may I notice from receiving this medication? Side effects that you should report to your care team as soon as possible: Allergic reactions--skin rash, itching, hives, swelling of the face, lips, tongue, or throat Low blood pressure--dizziness, feeling faint or lightheaded, blurry vision Shortness of breath Side effects that usually do not require medical attention (report to your care team if they continue or are bothersome): Flushing Headache Joint pain Muscle pain Nausea Pain, redness, or irritation at injection site This list may not describe all possible side effects. Call your doctor for medical advice about side effects. You may report side effects to FDA at 1-800-FDA-1088. Where should I keep my medication? This medication is given in a hospital or clinic. It will not be stored at home. NOTE: This sheet is a summary. It may not cover all possible information. If you have questions about this medicine, talk to your doctor, pharmacist, or health  care provider.  2024 Elsevier/Gold Standard (2023-01-23 00:00:00)

## 2023-07-19 DIAGNOSIS — H353211 Exudative age-related macular degeneration, right eye, with active choroidal neovascularization: Secondary | ICD-10-CM | POA: Diagnosis not present

## 2023-07-19 MED ORDER — OCTREOTIDE ACETATE 30 MG IM KIT
PACK | INTRAMUSCULAR | Status: AC
  Filled 2023-07-19: qty 1

## 2023-07-19 NOTE — Telephone Encounter (Signed)
Pt wife contacted office and EGD scheduled for 08/01/23 at 8:45am. Will place instructions on my chart once pre op has been received.

## 2023-07-22 NOTE — Telephone Encounter (Signed)
Instructions sent via my chart. PA approved via cohere

## 2023-07-26 ENCOUNTER — Encounter: Payer: Self-pay | Admitting: Student

## 2023-07-26 ENCOUNTER — Ambulatory Visit: Payer: Medicare HMO | Attending: Student | Admitting: Student

## 2023-07-26 VITALS — BP 120/82 | HR 55 | Ht 71.0 in | Wt 174.0 lb

## 2023-07-26 DIAGNOSIS — D509 Iron deficiency anemia, unspecified: Secondary | ICD-10-CM

## 2023-07-26 DIAGNOSIS — I77819 Aortic ectasia, unspecified site: Secondary | ICD-10-CM | POA: Diagnosis not present

## 2023-07-26 DIAGNOSIS — R600 Localized edema: Secondary | ICD-10-CM | POA: Diagnosis not present

## 2023-07-26 DIAGNOSIS — Z87898 Personal history of other specified conditions: Secondary | ICD-10-CM | POA: Diagnosis not present

## 2023-07-26 DIAGNOSIS — I10 Essential (primary) hypertension: Secondary | ICD-10-CM

## 2023-07-26 NOTE — Patient Instructions (Addendum)
 Medication Instructions:  Your physician recommends that you continue on your current medications as directed. Please refer to the Current Medication list given to you today.  *If you need a refill on your cardiac medications before your next appointment, please call your pharmacy*   Lab Work: NONE   If you have labs (blood work) drawn today and your tests are completely normal, you will receive your results only by: MyChart Message (if you have MyChart) OR A paper copy in the mail If you have any lab test that is abnormal or we need to change your treatment, we will call you to review the results.   Testing/Procedures: NONE    Follow-Up: At [facility], you and your health needs are our priority.  As part of our continuing mission to provide you with exceptional heart care, we have created designated Provider Care Teams.  These Care Teams include your primary Cardiologist (physician) and Advanced Practice Providers (APPs -  Physician Assistants and Nurse Practitioners) who all work together to provide you with the care you need, when you need it.  We recommend signing up for the patient portal called MyChart.  Sign up information is provided on this After Visit Summary.  MyChart is used to connect with patients for Virtual Visits (Telemedicine).  Patients are able to view lab/test results, encounter notes, upcoming appointments, etc.  Non-urgent messages can be sent to your provider as well.   To learn more about what you can do with MyChart, go to [email or web address].    Your next appointment:   1 year  Provider:   You may see Alvan Carrier, MD or one of the following Advanced Practice Providers on your designated Care Team:   Laymon Qua, PA-C  Olivia Pavy, [place]     Other Instructions Thank you for choosing [facility]!

## 2023-07-26 NOTE — Progress Notes (Signed)
 Cardiology Office Note    Date:  07/27/2023  ID:  Cole Brown, DOB 09-Nov-1945, MRN [id number] Cardiologist: Alvan Carrier, MD    History of Present Illness:    Cole Brown is a 78 y.o. male with past medical history of palpitations, history of chest pain (prior symptoms in 2018 but no recent recurrence), fibromyalgia, [name], HTN, anemia and history of GI bleed who presents to the office today for 107-month follow-up.  He was last examined by myself in 04/2023 and his biggest issue at that time had been brain fog and memory issues along with progressive weakness. He denied any recent anginal symptoms. He was needing cardiac clearance for upcoming colovesicular fistula and given that he was unable to perform more than 4 METS of activity, a Lexiscan  Myoview  was recommended for further assessment. Hemoglobin did remain low at 7.3 by follow-up labs and he was encouraged to follow-up with Gastroenterology. NST later that month showed no evidence of infarction or ischemia. EF was read as low was 40% but was normal at 60 to 65% when checked by echocardiogram several weeks prior. He was cleared to proceed with surgery without further cardiac testing.  He did ultimately undergo partial colectomy and repair of colovesical fistula by Dr. Mavis in 06/2023. Final pathology was negative for malignancy by review of notes. He did follow-up with Hematology in regards to his anemia and was felt to be due to iron  deficiency anemia and he has been started on IV iron  infusions. Hemoglobin had improved to 10.8 by most recent check on 07/04/2023.  In talking with the patient and his wife today, he reports significant improvement in his symptoms over the past few months. Still having some fatigue but energy level overall improved. He denies any associated dyspnea on exertion or chest pain. No recent palpitations, orthopnea, PND or pitting edema. Still having some brain fog and was evaluated by Neurology and  informed this was age-related. 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  Studies Reviewed:   EKG: EKG is not ordered today.  Echocardiogram: 03/2023 IMPRESSIONS     1. Left ventricular ejection fraction, by estimation, is 60 to 65%. Left  ventricular ejection fraction by 3D volume is 70 %. The left ventricle has  normal function. The left ventricle has no regional wall motion  abnormalities. Left ventricular diastolic   parameters were normal.   2. Right ventricular systolic function is normal. The right ventricular  size is normal. There is normal pulmonary artery systolic pressure.   3. The mitral valve is normal in structure. No evidence of mitral valve  regurgitation. No evidence of mitral stenosis.   4. The aortic valve is tricuspid. Aortic valve regurgitation is not  visualized. No aortic stenosis is present.   5. Aortic dilatation noted. There is borderline dilatation of the aortic  root, measuring 38 mm. There is borderline dilatation of the ascending  aorta, measuring 39 mm.   6. The inferior vena cava is normal in size with greater than 50%  respiratory variability, suggesting right atrial pressure of 3 mmHg.   Comparison(s): No significant change from prior study.   NST: 04/2023   Stress ECG is negative for ischemia.   LV perfusion is abnormal. Increased radiotracer activity in the stomach resulting in decreased activity in the entire left ventricle / normalization of LV display. There is no evidence of ischemia. There is no evidence of infarction.   Left  ventricular function is abnormal. Nuclear stress EF: 40%. End diastolic cavity size is moderately enlarged. End systolic cavity size is mildly enlarged.   Findings are consistent with no ischemia and no infarction. The study is intermediate risk based on nuclear LVEF 40% but visually LVEF appears to be normal. Correlate with 2D Echocardiogram.  Physical Exam:    VS:  BP 120/82 (BP Location: Right Arm, Cuff Size: Normal)   Pulse (!) 55   Ht 5' 11 (1.803 m)   Wt 174 lb (78.9 kg)   SpO2 98%   BMI 24.27 kg/m    Wt Readings from Last 3 Encounters:  07/26/23 174 lb (78.9 kg)  07/18/23 174 lb 12.8 oz (79.3 kg)  07/11/23 175 lb (79.4 kg)     GEN: Well nourished, well developed male appearing in no acute distress NECK: No JVD; No carotid bruits CARDIAC: RRR, no murmurs, rubs, gallops RESPIRATORY:  Clear to auscultation without rales, wheezing or rhonchi  ABDOMEN: Appears non-distended. No obvious abdominal masses. EXTREMITIES: No clubbing or cyanosis. No pitting edema.  Distal pedal pulses are 2+ bilaterally.   Assessment and Plan:   1. Lower Extremity Edema - Symptoms have resolved and echocardiogram in 03/2023 was reassuring and showed a preserved EF of 60 to 65% and normal RV function. We reviewed that he can take Lasix  as needed for worsening edema or weight gain greater than 3 pounds overnight or 5 pounds in 1 week.  2. Palpitations - Symptoms have overall been well-controlled. Continue Atenolol  12.5 mg daily.  3. HTN - Blood pressure is well-controlled at 120/82 during today's visit. Continue current medical therapy with Amlodipine  5 mg daily and Atenolol  12.5 mg daily.  4. Anemia - Hemoglobin had improved to 10.8 when checked on 07/04/2023. He is being followed by Hematology and is receiving iron  infusions.  5. Aortic Dilatation - Echocardiogram in 03/2023 showed he had borderline dilatation of the aortic root at 38 mm and borderline dilatation of the ascending aorta at 39 mm. Can obtain follow-up imaging within the next 1 to 2 years.   Disposition: They prefer to follow-up in 1 year given everything is stable from a cardiac perspective.  Signed, Laymon CHRISTELLA Qua, PA-C

## 2023-07-27 ENCOUNTER — Encounter: Payer: Self-pay | Admitting: Student

## 2023-07-29 ENCOUNTER — Encounter (HOSPITAL_COMMUNITY)
Admission: RE | Admit: 2023-07-29 | Discharge: 2023-07-29 | Disposition: A | Discharge status: Home / Self Care | Payer: Medicare HMO | Source: Ambulatory Visit | Attending: Gastroenterology | Admitting: Gastroenterology

## 2023-07-31 NOTE — Anesthesia Preprocedure Evaluation (Signed)
Anesthesia Evaluation  Patient identified by MRN, date of birth, ID band Patient awake    Reviewed: Allergy & Precautions, NPO status , Patient's Chart, lab work & pertinent test results  History of Anesthesia Complications Negative for: history of anesthetic complications  Airway Mallampati: III   Neck ROM: Full    Dental  (+) Implants Lower molar chipped:   Pulmonary sleep apnea and Continuous Positive Airway Pressure Ventilation , neg COPD, Patient abstained from smoking.Not current smoker   Pulmonary exam normal breath sounds clear to auscultation       Cardiovascular Exercise Tolerance: Good METShypertension, (-) CAD and (-) Past MI Normal cardiovascular exam(-) dysrhythmias  Rhythm:Regular Rate:Normal     Neuro/Psych negative neurological ROS  negative psych ROS   GI/Hepatic ,GERD  ,,(+)     (-) substance abuse    Endo/Other  diabetes, Type 2Hypothyroidism    Renal/GU Renal disease (nephrolithiasis)negative Renal ROS     Musculoskeletal  (+) Arthritis ,    Abdominal   Peds  Hematology negative hematology ROS (+)   Anesthesia Other Findings Past Medical History: No date: Arthritis No date: B12 deficiency No date: Bladder polyps No date: Diverticulitis No date: GERD (gastroesophageal reflux disease) No date: History of blood in urine No date: History of chickenpox No date: History of [place] measles No date: History of kidney stones No date: History of mumps No date: Hypertension No date: Hypothyroidism No date: IBS (irritable bowel syndrome) No date: LPRD (laryngopharyngeal reflux disease) No date: Sleep apnea     Comment:  CPAP  Reproductive/Obstetrics                              Anesthesia Physical Anesthesia Plan  ASA: 3  Anesthesia Plan: MAC   Post-op Pain Management: Minimal or no pain anticipated   Induction: Intravenous  PONV Risk Score and Plan: 2 and  TIVA, Midazolam and Treatment may vary due to age or medical condition  Airway Management Planned: Nasal Cannula  Additional Equipment:   Intra-op Plan:   Post-operative Plan:   Informed Consent: I have reviewed the patients History and Physical, chart, labs and discussed the procedure including the risks, benefits and alternatives for the proposed anesthesia with the patient or authorized representative who has indicated his/her understanding and acceptance.       Plan Discussed with: CRNA  Anesthesia Plan Comments: (Explained risks of anesthesia, including PONV, and rare emergencies such as cardiac events, respiratory problems, and allergic reactions, requiring invasive intervention. Discussed the role of CRNA in patient's perioperative care. Patient understands. )         Anesthesia Quick Evaluation

## 2023-08-01 ENCOUNTER — Encounter (HOSPITAL_COMMUNITY): Payer: Self-pay

## 2023-08-01 ENCOUNTER — Ambulatory Visit (HOSPITAL_COMMUNITY)
Admission: RE | Admit: 2023-08-01 | Discharge: 2023-08-01 | Disposition: A | Discharge status: Home / Self Care | Payer: Medicare HMO | Attending: Gastroenterology | Admitting: Gastroenterology

## 2023-08-01 ENCOUNTER — Encounter (HOSPITAL_COMMUNITY): Payer: Self-pay | Admitting: Gastroenterology

## 2023-08-01 ENCOUNTER — Ambulatory Visit (HOSPITAL_COMMUNITY): Payer: Self-pay | Admitting: Anesthesiology

## 2023-08-01 ENCOUNTER — Encounter (HOSPITAL_COMMUNITY)
Admission: RE | Disposition: A | Discharge status: Home / Self Care | Payer: Self-pay | Source: Home / Self Care | Attending: Gastroenterology

## 2023-08-01 DIAGNOSIS — K279 Peptic ulcer, site unspecified, unspecified as acute or chronic, without hemorrhage or perforation: Secondary | ICD-10-CM | POA: Diagnosis not present

## 2023-08-01 DIAGNOSIS — G4730 Sleep apnea, unspecified: Secondary | ICD-10-CM | POA: Insufficient documentation

## 2023-08-01 DIAGNOSIS — Z79890 Hormone replacement therapy (postmenopausal): Secondary | ICD-10-CM | POA: Insufficient documentation

## 2023-08-01 DIAGNOSIS — I10 Essential (primary) hypertension: Secondary | ICD-10-CM | POA: Insufficient documentation

## 2023-08-01 DIAGNOSIS — K449 Diaphragmatic hernia without obstruction or gangrene: Secondary | ICD-10-CM | POA: Insufficient documentation

## 2023-08-01 DIAGNOSIS — K3189 Other diseases of stomach and duodenum: Secondary | ICD-10-CM | POA: Diagnosis not present

## 2023-08-01 DIAGNOSIS — K319 Disease of stomach and duodenum, unspecified: Secondary | ICD-10-CM | POA: Diagnosis not present

## 2023-08-01 DIAGNOSIS — Z79899 Other long term (current) drug therapy: Secondary | ICD-10-CM | POA: Insufficient documentation

## 2023-08-01 DIAGNOSIS — K2950 Unspecified chronic gastritis without bleeding: Secondary | ICD-10-CM

## 2023-08-01 DIAGNOSIS — N321 Vesicointestinal fistula: Secondary | ICD-10-CM

## 2023-08-01 DIAGNOSIS — K2970 Gastritis, unspecified, without bleeding: Secondary | ICD-10-CM | POA: Insufficient documentation

## 2023-08-01 DIAGNOSIS — Z8711 Personal history of peptic ulcer disease: Secondary | ICD-10-CM | POA: Diagnosis not present

## 2023-08-01 HISTORY — PX: ESOPHAGOGASTRODUODENOSCOPY (EGD) WITH PROPOFOL: SHX5813

## 2023-08-01 HISTORY — PX: BIOPSY: SHX5522

## 2023-08-01 SURGERY — ESOPHAGOGASTRODUODENOSCOPY (EGD) WITH PROPOFOL
Anesthesia: Choice

## 2023-08-01 MED ORDER — LIDOCAINE HCL (CARDIAC) PF 100 MG/5ML IV SOSY
PREFILLED_SYRINGE | INTRAVENOUS | Status: DC | PRN
  Administered 2023-08-01: 60 mg via INTRATRACHEAL

## 2023-08-01 MED ORDER — LACTATED RINGERS IV SOLN: INTRAVENOUS | Status: DC

## 2023-08-01 MED ORDER — PROPOFOL 10 MG/ML IV BOLUS
INTRAVENOUS | Status: DC | PRN
Start: 2023-08-01 — End: 2023-08-01
  Administered 2023-08-01: 100 mg via INTRAVENOUS

## 2023-08-01 NOTE — Op Note (Signed)
[facility] Patient Name: Cole Brown Procedure Date: 08/01/2023 7:13 AM MRN: [id number] Date of Birth: [birth year] Attending MD: Sanjuan Dame , MD, [id number] CSN: [id number] Age: 78 Admit Type: Outpatient Procedure:                Upper GI endoscopy Indications:              Follow-up of peptic ulcer, Personal history of                            peptic ulcer disease Providers:                Sanjuan Dame, MD, Edrick Kins, RN, Lennice Sites                            Technician, Technician Referring MD:              Medicines:                Monitored Anesthesia Care Complications:            No immediate complications. Estimated Blood Loss:     Estimated blood loss: none. Procedure:                Pre-Anesthesia Assessment:                           - Prior to the procedure, a History and Physical                            was performed, and patient medications and                            allergies were reviewed. The patient's tolerance of                            previous anesthesia was also reviewed. The risks                            and benefits of the procedure and the sedation                            options and risks were discussed with the patient.                            All questions were answered, and informed consent                            was obtained. Prior Anticoagulants: The patient has                            taken no anticoagulant or antiplatelet agents. ASA                            Grade Assessment: III - A patient with severe  systemic disease. After reviewing the risks and                            benefits, the patient was deemed in satisfactory                            condition to undergo the procedure.                           After obtaining informed consent, the endoscope was                            passed under direct vision. Throughout the                            procedure, the  patient's blood pressure, pulse, and                            oxygen saturations were monitored continuously. The                            [id number]-H190 ([id number]) scope was introduced through the                            mouth, and advanced to the second part of duodenum.                            The upper GI endoscopy was accomplished without                            difficulty. The patient tolerated the procedure                            well. Scope In: 7:32:27 AM Scope Out: 7:35:45 AM Total Procedure Duration: 0 hours 3 minutes 18 seconds  Findings:      The examined esophagus was normal.      Mildly erythematous mucosa without bleeding was found in the stomach.       Biopsies were taken with a cold forceps for histology.      The duodenal bulb and second portion of the duodenum were normal.      There is no endoscopic evidence of ulceration in the duodenal bulb and       in the second portion of the duodenum. Impression:               - Normal esophagus.                           - Erythematous mucosa in the stomach. Biopsied.                           - Normal duodenal bulb and second portion of the                            duodenum. Moderate Sedation:      Per Anesthesia  Care Recommendation:           - Patient has a contact number available for                            emergencies. The signs and symptoms of potential                            delayed complications were discussed with the                            patient. Return to normal activities tomorrow.                            Written discharge instructions were provided to the                            patient.                           - Resume previous diet.                           - Continue present medications.                           - Await pathology results.                           -Follow up in Gi clinic to be scheduled for                            colonoscopy as last colonoscopy was not  complete Procedure Code(s):        --- Professional ---                           [phone], Esophagogastroduodenoscopy, flexible,                            transoral; with biopsy, single or multiple Diagnosis Code(s):        --- Professional ---                           K31.89, Other diseases of stomach and duodenum                           K27.9, Peptic ulcer, site unspecified, unspecified                            as acute or chronic, without hemorrhage or                            perforation                           Z87.11, Personal history of peptic ulcer disease CPT copyright 2022 American Medical Association. All rights reserved. The codes documented  in this report are preliminary and upon coder review may  be revised to meet current compliance requirements. Sanjuan Dame, MD Sanjuan Dame, MD 08/01/2023 7:43:18 AM This report has been signed electronically. Number of Addenda: 0

## 2023-08-01 NOTE — Transfer of Care (Signed)
Immediate Anesthesia Transfer of Care Note  Patient: Cole Brown  Procedure(s) Performed: ESOPHAGOGASTRODUODENOSCOPY (EGD) WITH PROPOFOL BIOPSY  Patient Location: Short Stay  Anesthesia Type:General  Level of Consciousness: drowsy  Airway & Oxygen Therapy: Patient Spontanous Breathing  Post-op Assessment: Report given to RN and Post -op Vital signs reviewed and stable  Post vital signs: Reviewed and stable  Last Vitals:  Vitals Value Taken Time  BP 9+4/38   Temp    Pulse 48   Resp 10   SpO2 98%     Last Pain:  Vitals:   08/01/23 0727  TempSrc:   PainSc: 0-No pain         Complications: No notable events documented.

## 2023-08-01 NOTE — H&P (Signed)
Primary Care Physician:  Assunta Found, MD Primary Gastroenterologist:  Dr. Tasia Catchings  Pre-Procedure History & Physical: HPI:  Cole Brown is a 78 y.o. male is here for Upper endoscopy to assess healing of ulcers.    Patient had EGD 02/2023 with Forrest 1a ulcer No melena or hematochezia.  No abdominal pain or unintentional weight loss.  No change in bowel habits.  Overall feels well from a GI standpoint.  Patient recently had colectomy and will be pending complete colonoscopy in next few months  Last EGD:   - 1 cm hiatal hernia. - Red blood in the gastric body. Fluid aspiration performed. - Non- bleeding gastric ulcers with a clean ulcer base ( Forrest Class III) . Biopsied. - Non- obstructing spurting duodenal ulcer with spurting hemorrhage ( Forrest Class Ia) . Injected. Treated with bipolar cautery. Clip was placed. Clip manufacturer: [employer]. - Non- bleeding duodenal ulcer with a clean ulcer base ( Forrest Class III) . Past Medical History:  Diagnosis Date   Anemia    Diverticula of colon    2016 colonoscopy   Fibromyalgia    Fibromyalgia    History of GI diverticular bleed    Hypercholesteremia    Hypertension    Multiple gastric ulcers    Sleep apnea     Past Surgical History:  Procedure Laterality Date   allergy shots  weekly   BIOPSY  02/15/2023   Procedure: BIOPSY;  Surgeon: Dolores Frame, MD;  [facility]: [facility];  Service: Gastroenterology;;   CHOLECYSTECTOMY     COLONOSCOPY     Dr.Rehman   COLONOSCOPY N/A 01/27/2015   Rehman: multiple diverticula at sigmoid colon,ext hemorrhoids   COLONOSCOPY WITH PROPOFOL N/A 02/15/2023   Procedure: COLONOSCOPY WITH PROPOFOL;  Surgeon: Dolores Frame, MD;  Location: [facility];  Service: Gastroenterology;  Laterality: N/A;   ESOPHAGOGASTRODUODENOSCOPY (EGD) WITH PROPOFOL N/A 02/15/2023   Procedure: ESOPHAGOGASTRODUODENOSCOPY (EGD) WITH PROPOFOL;  Surgeon: Dolores Frame, MD;   Location: [facility];  Service: Gastroenterology;  Laterality: N/A;   EYE SURGERY Bilateral    partial cornea transplants    FLEXIBLE SIGMOIDOSCOPY  03/14/2023   Procedure: FLEXIBLE SIGMOIDOSCOPY;  Surgeon: Franky Macho, MD;  Location: [facility];  Service: Endoscopy;;   GALLBLADDER SURGERY  12/1993   HOT HEMOSTASIS  02/15/2023   Procedure: HOT HEMOSTASIS (ARGON PLASMA COAGULATION/BICAP);  Surgeon: [name], [name], MD;  Location: [facility];  Service: Gastroenterology;;   PARTIAL COLECTOMY N/A 06/21/2023   Procedure: PARTIAL COLECTOMY,  repair colovesicle fistula;  Surgeon: Franky Macho, MD;  Location: [facility];  Service: General;  Laterality: N/A;   SCLEROTHERAPY  02/15/2023   Procedure: Susa Day;  Surgeon: [name], [name], MD;  Location: [facility];  Service: Gastroenterology;;   UPPER GASTROINTESTINAL ENDOSCOPY      Prior to Admission medications   Medication Sig Start Date End Date Taking? Authorizing Provider  acetaminophen (TYLENOL) 500 MG tablet Take 1,000 mg by mouth every 6 (six) hours as needed (pain.).   Yes [provider]  amLODipine (NORVASC) 5 MG tablet Take 1 tablet (5 mg total) by mouth daily. 08/28/22  Yes [name], NP  atenolol (TENORMIN) 25 MG tablet TAKE (1/2) TABLET BY MOUTH ONCE DAILY. Patient taking differently: Take 12.5 mg by mouth every evening. 12/03/22  Yes Branch, Dorothe Pea, MD  azelastine (ASTELIN) 0.1 % nasal spray Place 1 spray into both nostrils 2 (two) times daily as needed for allergies. 09/17/19  Yes [provider]  Bevacizumab (AVASTIN IV) Inject into the vein.   Yes [provider]  Cholecalciferol (VITAMIN D) 50 MCG (2000 UT) CAPS Take 2,000 Units by mouth daily.   Yes [provider]  cyanocobalamin (VITAMIN B12) 1000 MCG tablet Take 1,000 mcg by mouth daily.   Yes [provider]  diphenhydrAMINE (BENADRYL) 25 MG tablet Take 25 mg by mouth daily as needed for  allergies.   Yes [provider]  diphenhydrAMINE-zinc acetate (BENADRYL) cream Apply topically daily.   Yes [provider]  FLUoxetine (PROZAC) 10 MG tablet Take 10 mg by mouth in the morning.   Yes [provider]  fluticasone (FLONASE) 50 MCG/ACT nasal spray Place 1-2 sprays into both nostrils daily as needed for allergies. 07/27/20  Yes [provider]  folic acid (FOLVITE) 1 MG tablet TAKE 2 TABLETS BY MOUTH DAILY. Patient taking differently: Take 2 mg by mouth daily. 10/12/22  Yes Gearldine Bienenstock, PA-C  furosemide (LASIX) 20 MG tablet Take 20 mg by mouth every other day. In the morning.   Yes [provider]  hydrocortisone 1 % ointment Apply 1 Application topically daily.   Yes [provider]  Investigational - Study Medication Take 40 mg by mouth in the morning. Atorvastatin study med. PREVENTABLE trial   Yes [provider]  levocetirizine (XYZAL) 5 MG tablet Take 5 mg by mouth every evening.    Yes [provider]  Multiple Vitamins-Minerals (ICAPS AREDS 2 PO) Take 1 capsule by mouth in the morning.   Yes [provider]  Omega-3 Fatty Acids (FISH OIL) 1000 MG CAPS Take 1,000 mg by mouth in the morning.   Yes [provider]  pantoprazole (PROTONIX) 40 MG tablet Take 1 tablet (40 mg total) by mouth daily. 06/13/23 06/12/24 Yes Khamille Beynon, Juanetta Beets, MD  TESTOSTERONE COMPOUNDING KIT TD Apply 4 Pump topically every evening. 10% compounded gel from [facility] (4 clicks)   Yes [provider]  triazolam (HALCION) 0.25 MG tablet Take 0.25 mg by mouth at bedtime.    Yes [provider]  EPINEPHrine 0.3 mg/0.3 mL IJ SOAJ injection Inject 0.3 mg into the muscle as needed for anaphylaxis. 10/14/19   [provider]  faricimab-svoa (VABYSMO) 6 MG/0.05ML SOSY intravitreal injection 6 mg by Intravitreal route every 30 (thirty) days. Patient not taking: Reported on 07/26/2023     [provider]  HYDROcodone-acetaminophen (NORCO) 5-325 MG tablet Take 1 tablet by mouth every 6 (six) hours as needed for moderate pain (pain score 4-6). Patient not taking: Reported on 07/26/2023 06/24/23   Franky Macho, MD  montelukast (SINGULAIR) 10 MG tablet Take 10 mg by mouth at bedtime. Patient not taking: Reported on 07/26/2023    [provider]    Allergies as of 07/19/2023 - Review Complete 07/10/2023  Allergen Reaction Noted   Bee pollen  02/12/2023   Plaquenil [hydroxychloroquine sulfate]  11/06/2018   Lodine [etodolac] Swelling and Rash 01/29/2013    Family History  Problem Relation Age of Onset   Heart disease Mother    Pancreatic cancer Mother    Prostate cancer Father    Healthy Sister    Parkinson's disease Brother    Asthma Sister    Healthy Sister     Social History   Socioeconomic History   Marital status: Married    Spouse name: Not on file   Number of children: Not on file   Years of education: Not on file   Highest  education level: Not on file  Occupational History   Not on file  Tobacco Use   Smoking status: Never    Passive exposure: Never   Smokeless tobacco: Never  Vaping Use   Vaping status: Never Used  Substance and Sexual Activity   Alcohol use: Not Currently   Drug use: No   Sexual activity: Not on file  Other Topics Concern   Not on file  Social History Narrative   Not on file   Social Drivers of Health   Financial Resource Strain: Not on file  Food Insecurity: No Food Insecurity (06/22/2023)   Hunger Vital Sign    Worried About Running Out of Food in the Last Year: Never true    Ran Out of Food in the Last Year: Never true  Transportation Needs: No Transportation Needs (06/22/2023)   PRAPARE - Administrator, Civil Service (Medical): No    Lack of Transportation (Non-Medical): No  Physical Activity: Not on file  Stress: Not on file  Social Connections: Socially Isolated (06/22/2023)   Social  Connection and Isolation Panel [NHANES]    Frequency of Communication with Friends and Family: Never    Frequency of Social Gatherings with Friends and Family: Never    Attends Religious Services: Never    Database administrator or Organizations: No    Attends Banker Meetings: Never    Marital Status: Married  Catering manager Violence: Not At Risk (06/22/2023)   Humiliation, Afraid, Rape, and Kick questionnaire    Fear of Current or Ex-Partner: No    Emotionally Abused: No    Physically Abused: No    Sexually Abused: No    Review of Systems: See HPI, otherwise negative ROS  Physical Exam: Vital signs in last 24 hours: Temp:  [97.7 F (36.5 C)] 97.7 F (36.5 C) (02/13 0639) Pulse Rate:  [52] 52 (02/13 0639) Resp:  [15] 15 (02/13 0639) BP: (149)/(84) 149/84 (02/13 0639) SpO2:  [100 %] 100 % (02/13 0639) Weight:  [78.9 kg] 78.9 kg (02/13 0639)   General:   Alert,  Well-developed, well-nourished, pleasant and cooperative in NAD Head:  Normocephalic and atraumatic. Eyes:  Sclera clear, no icterus.   Conjunctiva pink. Ears:  Normal auditory acuity. Nose:  No deformity, discharge,  or lesions. Msk:  Symmetrical without gross deformities. Normal posture. Extremities:  Without clubbing or edema. Neurologic:  Alert and  oriented x4;  grossly normal neurologically. Skin:  Intact without significant lesions or rashes. Psych:  Alert and cooperative. Normal mood and affect.  Impression/Plan: Cole Brown is a 78 y.o. male is here for Upper endoscopy to assess healing of ulcers.    The risks of the procedure including infection, bleed, or perforation as well as benefits, limitations, alternatives and imponderables have been reviewed with the patient. Questions have been answered. All parties agreeable.

## 2023-08-01 NOTE — Discharge Instructions (Signed)

## 2023-08-01 NOTE — Anesthesia Postprocedure Evaluation (Signed)
Anesthesia Post Note  Patient: Cole Brown  Procedure(s) Performed: ESOPHAGOGASTRODUODENOSCOPY (EGD) WITH PROPOFOL BIOPSY  Patient location during evaluation: PACU Anesthesia Type: General Level of consciousness: awake and alert Pain management: pain level controlled Vital Signs Assessment: post-procedure vital signs reviewed and stable Respiratory status: spontaneous breathing, nonlabored ventilation and respiratory function stable Cardiovascular status: blood pressure returned to baseline and stable Postop Assessment: no apparent nausea or vomiting Anesthetic complications: no   No notable events documented.   Last Vitals:  Vitals:   08/01/23 0740 08/01/23 0744  BP: (!) 94/38 116/70  Pulse: (!) 49   Resp: 16   Temp: 36.5 C   SpO2: 100%     Last Pain:  Vitals:   08/01/23 0740  TempSrc:   PainSc: 0-No pain                 Roslynn Amble

## 2023-08-02 ENCOUNTER — Encounter (HOSPITAL_COMMUNITY): Payer: Self-pay | Admitting: Gastroenterology

## 2023-08-05 LAB — SURGICAL PATHOLOGY

## 2023-08-06 NOTE — Progress Notes (Signed)
 I reviewed the pathology results. Ann, can you send her a letter with the findings as described below please?  Thanks,  [name], MD Gastroenterology and Hepatology [facility]  ---------------------------------------------------------------------------------------------  [facility] [address], [place], [place] [address] Phone:  [phone]   08/06/23 [place], [place]   Dear Coralie Common,  I am writing to inform you that the biopsies taken during your recent endoscopic examination showed:  Good news stomach biopsies did not show any bacteria or any early cancer changes.  I look forward to seeing in the clinic to discuss complete colonoscopy  Also I value your feedback , so if you get a survey , please take the time to fill it out and thank you for choosing [facility]  Please call us at [phone] if you have persistent problems or have questions about your condition that have not been fully answered at this time.  Sincerely,  [name], MD Gastroenterology and Hepatology

## 2023-08-07 ENCOUNTER — Encounter (INDEPENDENT_AMBULATORY_CARE_PROVIDER_SITE_OTHER): Payer: Self-pay | Admitting: *Deleted

## 2023-08-07 NOTE — Progress Notes (Unsigned)
 Office Visit Note  Patient: Cole Brown             Date of Birth: 1946-04-25           MRN: [id number]             PCP: Assunta Found, MD Referring: Assunta Found, MD Visit Date: 08/21/2023 Occupation: @GUAROCC @  Subjective:  Discontinued methotrexate in January 2025   History of Present Illness: Cole Brown is a 78 y.o. male with history of autoimmune disease and osteoarthritis.  The patient was accompanied by his wife today who voiced several questions and concerns.  Since his last office visit he underwent partial colectomy with repair of colovesical fistula on 06/21/23--negative for malignancy.  He presented to the ED on 06/25/2023 with dark red blood in his stool--stool Hemoccult was negative.  He discontinued methotrexate in late December 2024 prior to undergoing surgical intervention. He has still been taking folic acid 2 mg daily.  Patient underwent EGD on 08/01/23-biopsies negative for bacteria or malignancy.  Patient was last seen in the office by Dr. Corliss Skains on 04/23/2023 at which time he was advised to reduce methotrexate from 8 tablets to 4 tablets weekly.  The plan was for him to discontinue methotrexate if he remained asymptomatic.  He has not resumed methotrexate since after surgical intervention. The patient denies any new or worsening symptoms since discontinuing methotrexate.  He denies any joint pain, joint swelling, or joint stiffness.  He denies any signs or symptoms of a flare.  According to the patient's wife he has never had inflammatory arthritis and she voiced that he does not need to resume methotrexate.  Patient continues to have intermittent symptoms of [name] phenomenon.  He remains on amlodipine 5 mg daily.  He is no longer taking aspirin as advised.  He continues to be treated for iron deficiency anemia and has a follow-up visit scheduled with hematology on 10/16/2023.   Activities of Daily Living:  Patient reports morning stiffness for 0 minutes.    Patient Denies nocturnal pain.  Difficulty dressing/grooming: Denies Difficulty climbing stairs: Denies Difficulty getting out of chair: Denies Difficulty using hands for taps, buttons, cutlery, and/or writing: Denies  Review of Systems  Constitutional:  Negative for fatigue.  HENT:  Negative for mouth sores, mouth dryness and nose dryness.   Eyes:  Negative for pain and dryness.  Respiratory:  Negative for shortness of breath and difficulty breathing.   Cardiovascular:  Negative for chest pain and palpitations.  Gastrointestinal:  Negative for blood in stool, constipation and diarrhea.  Endocrine: Negative for increased urination.  Genitourinary:  Negative for involuntary urination.  Musculoskeletal:  Negative for joint pain, gait problem, joint pain, joint swelling, myalgias, muscle weakness, morning stiffness, muscle tenderness and myalgias.  Skin:  Negative for color change, rash and sensitivity to sunlight.  Allergic/Immunologic: Negative for susceptible to infections.  Neurological:  Negative for dizziness and headaches.  Hematological:  Negative for swollen glands.  Psychiatric/Behavioral:  Negative for depressed mood and sleep disturbance. The patient is not nervous/anxious.     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   Gastrointestinal hemorrhage 02/15/2023   Protein-calorie malnutrition, severe 02/14/2023   Acute blood loss anemia 02/12/2023   Perforation of  sigmoid colon (HCC) 01/08/2023   Acute post-hemorrhagic anemia 01/08/2023   Diverticulitis 01/03/2023   Hyponatremia 01/03/2023   Hematochezia 12/25/2022   Diverticulosis of colon without hemorrhage 12/25/2022   Diarrhea 03/30/2021   Hemorrhoids  03/30/2021   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   Lower abdominal pain 07/06/2019   Autoimmune disease (HCC) 09/15/2018   [name] disease without gangrene 09/15/2018   Hypercholesteremia 08/21/2018   History of diverticulitis 08/21/2018   History of iron deficiency anemia 08/21/2018   Fibromyalgia 08/21/2018   History of multiple allergies 08/21/2018   History of sleep apnea 08/21/2018   Rectal bleed 11/14/2016   Essential hypertension 11/14/2016   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    Past Medical History:  Diagnosis Date   Anemia    Diverticula of colon    2016 colonoscopy   Fibromyalgia    Fibromyalgia    History of GI diverticular bleed    Hypercholesteremia    Hypertension    Multiple gastric ulcers    Sleep apnea     Family History  Problem Relation Age of Onset   Heart disease Mother    Pancreatic cancer Mother    Prostate cancer Father    Healthy Sister    Parkinson's disease Brother    Asthma Sister    Healthy Sister    Past Surgical History:  Procedure Laterality Date   allergy shots  weekly   BIOPSY  02/15/2023   Procedure: BIOPSY;  Surgeon: Dolores Frame, MD;  [facility];  Service: Gastroenterology;;   BIOPSY  08/01/2023   Procedure: BIOPSY;  Surgeon: Franky Macho, MD;  [facility]: [facility];  Service: Endoscopy;;   CHOLECYSTECTOMY     COLONOSCOPY     Dr.Rehman   COLONOSCOPY N/A 01/27/2015   Rehman: multiple diverticula at sigmoid colon,ext hemorrhoids   COLONOSCOPY WITH PROPOFOL N/A 02/15/2023   Procedure: COLONOSCOPY WITH PROPOFOL;  Surgeon: Dolores Frame, MD;  [facility]: [facility];  Service: Gastroenterology;  Laterality: N/A;   ESOPHAGOGASTRODUODENOSCOPY (EGD) WITH PROPOFOL N/A 02/15/2023   Procedure: ESOPHAGOGASTRODUODENOSCOPY (EGD) WITH PROPOFOL;  Surgeon: Dolores Frame, MD;  [facility];  Service: Gastroenterology;  Laterality: N/A;   ESOPHAGOGASTRODUODENOSCOPY  (EGD) WITH PROPOFOL N/A 08/01/2023   Procedure: ESOPHAGOGASTRODUODENOSCOPY (EGD) WITH PROPOFOL;  Surgeon: Franky Macho, MD;  [facility];  Service: Endoscopy;  Laterality: N/A;  8:45AM;ASA 3, pt knows to arrive at 6:15   EYE SURGERY Bilateral    partial cornea transplants    FLEXIBLE SIGMOIDOSCOPY  03/14/2023   Procedure: FLEXIBLE SIGMOIDOSCOPY;  Surgeon: Franky Macho, MD;  [facility];  Service: Endoscopy;;   GALLBLADDER SURGERY  12/1993   HOT HEMOSTASIS  02/15/2023   Procedure: HOT HEMOSTASIS (ARGON PLASMA COAGULATION/BICAP);  Surgeon: [name], [name], MD;  [facility];  Service: Gastroenterology;;   PARTIAL COLECTOMY N/A 06/21/2023   Procedure: PARTIAL COLECTOMY,  repair colovesicle fistula;  Surgeon: Franky Macho, MD;  Location: [facility];  Service: General;  Laterality: N/A;   SCLEROTHERAPY  02/15/2023   Procedure: SCLEROTHERAPY;  Surgeon: [name], [name], MD;  [facility]: [facility];  Service: Gastroenterology;;   UPPER GASTROINTESTINAL ENDOSCOPY     Social History   Social History Narrative   Not on file   Immunization History  Administered Date(s) Administered   Influenza-Unspecified 04/16/2020   PFIZER(Purple Top)SARS-COV-2 Vaccination 08/05/2019, 08/26/2019   Zoster Recombinant(Shingrix) 01/03/2018, 06/12/2018     Objective: Vital Signs: BP (!) 157/84 (BP Location: Left Arm,  Patient Position: Sitting, Cuff Size: Normal)   Pulse (!) 51   Resp 14   Ht 5\' 11"  (1.803 m)   Wt 175 lb 12.8 oz (79.7 kg)   BMI 24.52 kg/m    Physical Exam Vitals and nursing note reviewed.  Constitutional:      Appearance: He is well-developed.  HENT:     Head: Normocephalic and atraumatic.  Eyes:     Conjunctiva/sclera: Conjunctivae normal.     Pupils: Pupils are equal, round, and reactive to light.  Cardiovascular:     Rate and Rhythm: Normal rate and regular rhythm.     Heart sounds: Normal heart sounds.  Pulmonary:      Effort: Pulmonary effort is normal.     Breath sounds: Normal breath sounds.  Abdominal:     General: Bowel sounds are normal.     Palpations: Abdomen is soft.  Musculoskeletal:     Cervical back: Normal range of motion and neck supple.  Skin:    General: Skin is warm and dry.     Capillary Refill: Capillary refill takes less than 2 seconds.  Neurological:     Mental Status: He is alert and oriented to person, place, and time.  Psychiatric:        Behavior: Behavior normal.      Musculoskeletal Exam: C-spine has good range of motion.  Shoulder joints have good range of motion with no discomfort.  Elbow joints, wrist joints, MCPs, PIPs, DIPs have good range of motion with no synovitis.  Thickening and subluxation of both CMC joints noted.  Synovial thickening of the right second and third MCP joints.  Hip joints have good range of motion with no groin pain.  Knee joints have good range of motion no warmth or effusion.  CDAI Exam: CDAI Score: -- Patient Global: --; Provider Global: -- Swollen: --; Tender: -- Joint Exam 08/21/2023   No joint exam has been documented for this visit   There is currently no information documented on the homunculus. Go to the Rheumatology activity and complete the homunculus joint exam.  Investigation: No additional findings.  Imaging: No results found.  Recent Labs: Lab Results  Component Value Date   WBC 7.3 07/04/2023   HGB 10.8 (L) 07/04/2023   PLT 299 07/04/2023   NA 129 (L) 06/24/2023   K 4.3 06/24/2023   CL 97 (L) 06/24/2023   CO2 24 06/24/2023   GLUCOSE 170 (H) 06/24/2023   BUN 21 06/24/2023   CREATININE 1.09 06/24/2023   BILITOT 0.8 06/24/2023   ALKPHOS 94 06/24/2023   AST 20 06/24/2023   ALT 26 06/24/2023   PROT 7.0 06/24/2023   ALBUMIN 3.4 (L) 06/24/2023   CALCIUM 9.0 06/24/2023   GFRAA 89 03/22/2020   QFTBGOLDPLUS NEGATIVE 08/21/2018    Speciality Comments: Plaquenil April 2020-discontinued due to rash Methotrexate is  started Nov 06, 2018  Procedures:  No procedures performed Allergies: Bee pollen, Plaquenil [hydroxychloroquine sulfate], and Lodine [etodolac]     Assessment / Plan:     Visit Diagnoses: Autoimmune disease (HCC) - ANA+ in 2018, ENA negative, [name] phenomenon, beta-2 GP 1 IgM positive, anticardiolipin IgM positive, elevated ESR, severe inflammatory arthritis: He has not had any signs or symptoms of a flare since his last office visit on 04/23/2023.  At his last office visit he was evaluated by Dr. Corliss Skains who recommended that the patient reduce methotrexate from 8 tablets to 4 tablets weekly due to recent hospitalization for diverticulitis and UTI/metabolic encephalopathy.  The plan was for him to discontinue methotrexate if he had no recurrence of symptoms.  The patient discontinued methotrexate at the end of December 2024 prior to undergoing a partial colectomy-repair colovesical fistula--he has not restarted methotrexate.  He has not had any signs or symptoms of inflammatory arthritis flare.  He has no joint tenderness or synovitis on exam.  He has not had any morning stiffness, nocturnal pain, or difficulty with ADLs.  He has not noticed any new or worsening symptoms since discontinuing methotrexate. He continues to have intermittent symptoms of [name] phenomenon especially during the winter months.  He has been taking amlodipine 5 mg daily.   Lab work from 05/27/2023 was reviewed today in the office: ANA 1: 320 NH, double-stranded DNA negative, complements within normal limits, ESR 34-improved, anticardiolipin IgM greater than 112 (IgA and IgG negative), and beta-2 glycoprotein IgM greater than 112 (IgA and IgG negative). He is no longer taking aspirin due to history of GI bleed/gastric and duodenum ulcers. No history of thrombotic events. Upcoming appointment scheduled with hematology on 10/16/23.  The patient does not plan to resume methotrexate at this time.  Discussed that he can  discontinue folic acid since he is no longer taking methotrexate.  Advised patient to monitor for symptoms of a flare closely.  Discussed signs and symptoms to monitor for.  Plan to update lab work at end of april/early may-future orders will be placed today.  He will follow-up in the office in 4 months or sooner if needed.  High risk medication use -Initially started methotrexate May 2020-discontinued end of December 2024.   History of recurrent infections: Diverticulitis July 2024, gastric and duodenum ulcers, metabolic encephalopathy due to Enterococcus UTI, and colovesical fistula  Patient does not plan to resume methotrexate at this time.  Ok to discontinue folic acid.   Previous therapy: Plaquenil and methotrexate.  CMP updated on 06/24/23. CBC updated on 07/04/23. He will be having upcoming lab work with hematology on 10/16/23--future orders placed today.   Anti-cardiolipin antibody positive - Followed by Dr. Avie Arenas and Rojelio Brenner, PA-C. Anticardiolipin IgM and beta-2 glycoprotein IgM remain positive--rechecked on 05/27/23. Previously discontinued aspirin due to GI bleed/chronic blood loss/iron deficiency anemia.  No history of venous or arterial thrombotic events. Scheduled to follow up with hematology 10/16/23.   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  Discussed the importance of keeping his core body temperature warm, wearing gloves and socks, and drinking warm fluids.  He is taking amlodipine 5 mg daily and fluoxetine 10 mg daily.  His wife plans on scheduling appointment with his PCP to discuss discontinuing fluoxetine due to this medication not being medically necessary.    Discussed signs and symptoms to monitor for--He will notify us if he develops any new or worsening symptoms of raynaud's.  History of iron deficiency anemia: History of gastric and duodenal ulcer-Due to  chronic blood loss.  Under the care of gastroenterology and hematology.  Underwent EGD on 08/01/23-biopsies negative.   Primary osteoarthritis of both hands: No active synovitis or tenderness.  Advised patient to notify us if he develops increased joint pain or joint swelling.   Fibromyalgia: No recent flares.   Trochanteric bursitis of both hips: Not currently symptomatic.   Other medical conditions are listed as follows:  History of diverticulitis: Hospitalized in July 2024.   Fuchs' corneal dystrophy, unspecified laterality  Essential hypertension: Blood pressure was 157/84 today in the  office.  History of sleep apnea  Hypercholesteremia  History of multiple allergies  Orders: No orders of the defined types were placed in this encounter.  No orders of the defined types were placed in this encounter.  Follow-Up Instructions: Return in about 4 months (around 12/21/2023) for Autoimmune Disease, Osteoarthritis.   Gearldine Bienenstock, PA-C  Note - This record has been created using Dragon software.  Chart creation errors have been sought, but may not always  have been located. Such creation errors do not reflect on  the standard of medical care.

## 2023-08-16 DIAGNOSIS — F419 Anxiety disorder, unspecified: Secondary | ICD-10-CM | POA: Diagnosis not present

## 2023-08-16 DIAGNOSIS — E782 Mixed hyperlipidemia: Secondary | ICD-10-CM | POA: Diagnosis not present

## 2023-08-16 DIAGNOSIS — K219 Gastro-esophageal reflux disease without esophagitis: Secondary | ICD-10-CM | POA: Diagnosis not present

## 2023-08-21 ENCOUNTER — Ambulatory Visit: Payer: Medicare HMO | Attending: Physician Assistant | Admitting: Physician Assistant

## 2023-08-21 ENCOUNTER — Encounter: Payer: Self-pay | Admitting: Physician Assistant

## 2023-08-21 VITALS — BP 157/84 | HR 51 | Resp 14 | Ht 71.0 in | Wt 175.8 lb

## 2023-08-21 DIAGNOSIS — R760 Raised antibody titer: Secondary | ICD-10-CM | POA: Diagnosis not present

## 2023-08-21 DIAGNOSIS — I10 Essential (primary) hypertension: Secondary | ICD-10-CM

## 2023-08-21 DIAGNOSIS — M7061 Trochanteric bursitis, right hip: Secondary | ICD-10-CM | POA: Diagnosis not present

## 2023-08-21 DIAGNOSIS — Z79899 Other long term (current) drug therapy: Secondary | ICD-10-CM | POA: Diagnosis not present

## 2023-08-21 DIAGNOSIS — Z8719 Personal history of other diseases of the digestive system: Secondary | ICD-10-CM | POA: Diagnosis not present

## 2023-08-21 DIAGNOSIS — Z8669 Personal history of other diseases of the nervous system and sense organs: Secondary | ICD-10-CM

## 2023-08-21 DIAGNOSIS — M797 Fibromyalgia: Secondary | ICD-10-CM | POA: Diagnosis not present

## 2023-08-21 DIAGNOSIS — M19042 Primary osteoarthritis, left hand: Secondary | ICD-10-CM

## 2023-08-21 DIAGNOSIS — M359 Systemic involvement of connective tissue, unspecified: Secondary | ICD-10-CM | POA: Diagnosis not present

## 2023-08-21 DIAGNOSIS — Z862 Personal history of diseases of the blood and blood-forming organs and certain disorders involving the immune mechanism: Secondary | ICD-10-CM

## 2023-08-21 DIAGNOSIS — M7062 Trochanteric bursitis, left hip: Secondary | ICD-10-CM

## 2023-08-21 DIAGNOSIS — E7800 Pure hypercholesterolemia, unspecified: Secondary | ICD-10-CM

## 2023-08-21 DIAGNOSIS — I7300 Raynaud's syndrome without gangrene: Secondary | ICD-10-CM

## 2023-08-21 DIAGNOSIS — Z889 Allergy status to unspecified drugs, medicaments and biological substances status: Secondary | ICD-10-CM

## 2023-08-21 DIAGNOSIS — H18519 Endothelial corneal dystrophy, unspecified eye: Secondary | ICD-10-CM

## 2023-08-21 DIAGNOSIS — M19041 Primary osteoarthritis, right hand: Secondary | ICD-10-CM

## 2023-08-21 NOTE — Addendum Note (Signed)
 Addended by: Ellen Henri on: 08/21/2023 10:46 AM   Modules accepted: Orders

## 2023-08-26 ENCOUNTER — Encounter (HOSPITAL_COMMUNITY): Payer: Self-pay | Admitting: General Surgery

## 2023-08-26 DIAGNOSIS — H353211 Exudative age-related macular degeneration, right eye, with active choroidal neovascularization: Secondary | ICD-10-CM | POA: Diagnosis not present

## 2023-09-03 ENCOUNTER — Ambulatory Visit: Admitting: Cardiology

## 2023-09-09 ENCOUNTER — Encounter (INDEPENDENT_AMBULATORY_CARE_PROVIDER_SITE_OTHER): Payer: Self-pay | Admitting: Gastroenterology

## 2023-09-09 ENCOUNTER — Ambulatory Visit (INDEPENDENT_AMBULATORY_CARE_PROVIDER_SITE_OTHER): Payer: Medicare HMO | Admitting: Gastroenterology

## 2023-09-09 VITALS — BP 158/83 | HR 69 | Temp 97.6°F | Ht 71.0 in | Wt 175.4 lb

## 2023-09-09 DIAGNOSIS — K259 Gastric ulcer, unspecified as acute or chronic, without hemorrhage or perforation: Secondary | ICD-10-CM | POA: Insufficient documentation

## 2023-09-09 DIAGNOSIS — D500 Iron deficiency anemia secondary to blood loss (chronic): Secondary | ICD-10-CM

## 2023-09-09 DIAGNOSIS — K269 Duodenal ulcer, unspecified as acute or chronic, without hemorrhage or perforation: Secondary | ICD-10-CM

## 2023-09-09 DIAGNOSIS — D509 Iron deficiency anemia, unspecified: Secondary | ICD-10-CM

## 2023-09-09 DIAGNOSIS — K5792 Diverticulitis of intestine, part unspecified, without perforation or abscess without bleeding: Secondary | ICD-10-CM

## 2023-09-09 DIAGNOSIS — Z8719 Personal history of other diseases of the digestive system: Secondary | ICD-10-CM | POA: Diagnosis not present

## 2023-09-09 DIAGNOSIS — Z862 Personal history of diseases of the blood and blood-forming organs and certain disorders involving the immune mechanism: Secondary | ICD-10-CM

## 2023-09-09 MED ORDER — POLYETHYLENE GLYCOL 3350 17 GM/SCOOP PO POWD: 8.5000 g | Freq: Every day | ORAL | Status: DC

## 2023-09-09 NOTE — Progress Notes (Signed)
 [name] , M.D. 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  Path negative for malignancy but demonstrated acute diverticulitis with abscess and fistulous track..  I reached out to Dr. Lovell Sheehan regarding timing of colonoscopy and is okay to go for  colonoscopy 3 months from the time of surgery  Patient was seen by hematology underwent IV iron infusion for iron deficiency anemia.  Patient reports significant improvement in terms of his symptoms have some fatigue but overall feeling well.  Patient reports taking iron tablets daily having 1 soft bowel movement daily.  No current complaints.  Taking only Tylenol 2 to 3 tablets daily, I ensure patient is not taking NSAID  Last EGD:  07/2023  - Normal esophagus. - Erythematous mucosa in the stomach. Biopsied. - Normal duodenal bulb and second portion of the duodenum.   - 1 cm hiatal hernia. - Red blood in the gastric body. Fluid aspiration performed. - Non- bleeding gastric ulcers with a clean ulcer base ( Forrest Class III) . Biopsied. - Non- obstructing spurting duodenal ulcer with spurting hemorrhage ( Forrest Class Ia) . Injected. Treated with  bipolar cautery. Clip was placed. Clip manufacturer: [employer]. - Non- bleeding duodenal ulcer with a clean ulcer base ( Forrest Class III) .   A. GASTRIC, BIOPSY:      Gastric antral mucosa with focal mild chronic inactive gastritis and reactive epithelial changes.      Gastric oxyntic mucosa with no significant diagnostic alteration No H.pylori identified on HE stain. 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 This stricture was not able to be traversed with changing 3 scopes; peadiatric , Ultraslim colonoscope and spaghetti upper scope. Malignancy is not excluded - Non- bleeding external and internal hemorrhoids. - No specimens collected.  august 2016 with multiple diverticula at sigmoid colon and external hemorrhoids     Colectomy path :  A. SIGMOID COLON, RESECTION (18 cm):  Diverticulosis showing acute diverticulitis with pericolic abscess and  granulation tissue compatible with a fistula tract  Benign incidental ulcerated inflammatory polyp  Proximal and distal margins viable and without acute inflammation    FHx: neg for any gastrointestinal/liver disease, no malignancies Social: neg smoking, alcohol or illicit drug use  Past Medical History: Past Medical History:  Diagnosis Date   Anemia    Diverticula of colon    2016 colonoscopy   Fibromyalgia    Fibromyalgia    History of GI diverticular bleed    Hypercholesteremia    Hypertension    Multiple gastric ulcers    Sleep apnea     Past Surgical History: Past Surgical History:  Procedure Laterality Date   allergy shots  weekly   BIOPSY  02/15/2023   Procedure: BIOPSY;  Surgeon: Dolores Frame, MD;  [facility];  Service: Gastroenterology;;   BIOPSY  08/01/2023   Procedure: BIOPSY;  Surgeon: Franky Macho, MD;  [facility];  Service: Endoscopy;;    CHOLECYSTECTOMY     COLONOSCOPY     Dr.Rehman   COLONOSCOPY N/A 01/27/2015   Rehman: multiple diverticula at sigmoid colon,ext hemorrhoids   COLONOSCOPY WITH PROPOFOL N/A 02/15/2023   Procedure: COLONOSCOPY WITH PROPOFOL;  Surgeon: Dolores Frame, MD;  [facility];  Service: Gastroenterology;  Laterality: N/A;   ESOPHAGOGASTRODUODENOSCOPY (EGD) WITH PROPOFOL N/A 02/15/2023   Procedure: ESOPHAGOGASTRODUODENOSCOPY (EGD) WITH PROPOFOL;  Surgeon: Dolores Frame, MD;  [facility];  Service: Gastroenterology;  Laterality: N/A;   ESOPHAGOGASTRODUODENOSCOPY (EGD) WITH PROPOFOL N/A 08/01/2023   Procedure: ESOPHAGOGASTRODUODENOSCOPY (EGD) WITH PROPOFOL;  Surgeon: Franky Macho, MD;  [facility];  Service: Endoscopy;  Laterality: N/A;  8:45AM;ASA 3, pt knows to arrive at 6:15   EYE SURGERY Bilateral    partial cornea transplants    FLEXIBLE SIGMOIDOSCOPY  03/14/2023   Procedure: FLEXIBLE SIGMOIDOSCOPY;  Surgeon: Franky Macho, MD;  [facility];  Service: Endoscopy;;   GALLBLADDER SURGERY  12/1993   HOT HEMOSTASIS  02/15/2023   Procedure: HOT HEMOSTASIS (ARGON PLASMA COAGULATION/BICAP);  Surgeon: [name], [name], MD;  [facility];  Service: Gastroenterology;;   PARTIAL COLECTOMY N/A 06/21/2023   Procedure: PARTIAL COLECTOMY,  repair colovesicle fistula;  Surgeon: Franky Macho, MD;  Location: [facility];  Service: General;  Laterality: N/A;   SCLEROTHERAPY  02/15/2023   Procedure: Susa Day;  Surgeon: [name], [name], MD;  [facility];  Service: Gastroenterology;;   UPPER GASTROINTESTINAL ENDOSCOPY      Family History: Family History  Problem Relation Age of Onset   Heart disease Mother    Pancreatic cancer Mother    Prostate cancer Father    Healthy Sister    Parkinson's disease Brother    Asthma Sister    Healthy Sister     Social History: Social History   Tobacco Use   Smoking Status Never   Passive exposure: Never  Smokeless Tobacco Never   Social History   Substance and Sexual Activity  Alcohol Use Not Currently   Social History   Substance and Sexual Activity  Drug Use No    Allergies: Allergies  Allergen Reactions   Bee Pollen     Seasonal allergies   Plaquenil [Hydroxychloroquine Sulfate]     rash   Lodine [Etodolac] Swelling and Rash    Medications: Current Outpatient Medications  Medication Sig Dispense Refill   acetaminophen (TYLENOL) 500 MG tablet Take 1,000 mg by mouth every 6 (six) hours as needed (pain.).     amLODipine (NORVASC) 5 MG tablet Take 1 tablet (5 mg total) by mouth daily. 90 tablet 3   atenolol (TENORMIN) 25 MG tablet TAKE (1/2) TABLET BY MOUTH ONCE DAILY. (Patient taking differently: Take 12.5 mg by mouth every evening.) 45 tablet 2   azelastine (ASTELIN) 0.1 % nasal spray Place 1 spray into both nostrils 2 (two) times daily as needed for allergies.     Bevacizumab (AVASTIN IV) Place into the right eye.     Cholecalciferol (VITAMIN D) 50 MCG (2000 UT) CAPS Take 2,000 Units by mouth daily.     cyanocobalamin (VITAMIN B12) 1000 MCG tablet Take 1,000 mcg by mouth daily.     diphenhydrAMINE (BENADRYL) 25 MG  tablet Take 25 mg by mouth daily as needed for allergies.     diphenhydrAMINE-zinc acetate (BENADRYL) cream Apply topically daily.     EPINEPHrine 0.3 mg/0.3 mL IJ SOAJ injection Inject 0.3 mg into the muscle as needed for anaphylaxis.     faricimab-svoa (VABYSMO) 6 MG/0.05ML SOSY intravitreal injection 6 mg by Intravitreal route every 30 (thirty) days.     FLUoxetine (PROZAC) 10 MG tablet Take 10 mg by mouth in the morning.     fluticasone (FLONASE) 50 MCG/ACT nasal spray Place 1-2 sprays into both nostrils daily as needed for allergies.     folic acid (FOLVITE) 1 MG tablet TAKE 2 TABLETS BY MOUTH DAILY. 180 tablet 3   furosemide (LASIX) 20 MG tablet Take 20 mg by mouth as needed. In the morning.      HYDROcodone-acetaminophen (NORCO) 5-325 MG tablet Take 1 tablet by mouth every 6 (six) hours as needed for moderate pain (pain score 4-6). 20 tablet 0   hydrocortisone 1 % ointment Apply 1 Application topically daily.     Investigational - Study Medication Take 40 mg by mouth in the morning. Atorvastatin study med. PREVENTABLE trial     levocetirizine (XYZAL) 5 MG tablet Take 5 mg by mouth every evening.      montelukast (SINGULAIR) 10 MG tablet Take 10 mg by mouth at bedtime.     Omega-3 Fatty Acids (FISH OIL) 1000 MG CAPS Take 1,000 mg by mouth in the morning.     pantoprazole (PROTONIX) 40 MG tablet Take 1 tablet (40 mg total) by mouth daily. 90 tablet 3   TESTOSTERONE COMPOUNDING KIT TD Apply 4 Pump topically every evening. 10% compounded gel from [facility] (4 clicks)     triazolam (HALCION) 0.25 MG tablet Take 0.25 mg by mouth at bedtime.      Multiple Vitamins-Minerals (ICAPS AREDS 2 PO) Take 1 capsule by mouth in the morning. (Patient not taking: Reported on 09/09/2023)     No current facility-administered medications for this visit.   Facility-Administered Medications Ordered in Other Visits  Medication Dose Route Frequency Provider Last Rate Last Admin   octreotide (SANDOSTATIN LAR) 30 MG IM injection             Review of Systems: GENERAL: negative for malaise, night sweats HEENT: No changes in hearing or vision, no nose bleeds or other nasal problems. NECK: Negative for lumps, goiter, pain and significant neck swelling RESPIRATORY: Negative for cough, wheezing CARDIOVASCULAR: Negative for chest pain, leg swelling, palpitations, orthopnea GI: SEE HPI MUSCULOSKELETAL: Negative for joint pain or swelling, back pain, and muscle pain. SKIN: Negative for lesions, rash HEMATOLOGY Negative for prolonged bleeding, bruising easily, and swollen nodes. ENDOCRINE: Negative for cold or heat intolerance, polyuria, polydipsia and goiter. NEURO: negative for tremor, gait imbalance,  syncope and seizures. The remainder of the review of systems is noncontributory.   Physical Exam: BP (!) 158/83   Pulse 69   Temp 97.6 F (36.4 C) (Oral)   Ht 5\' 11"  (1.803 m)   Wt 175 lb 6.4 oz (79.6 kg)   BMI 24.46 kg/m  GENERAL: The patient is AO x3, in no acute distress. HEENT: Head is normocephalic and atraumatic. EOMI are intact. Mouth is well hydrated and without lesions. NECK: Supple. No masses LUNGS: Clear to auscultation. No presence of rhonchi/wheezing/rales. Adequate chest expansion HEART: RRR, normal s1 and s2. ABDOMEN: Soft, nontender, no guarding, no peritoneal signs, and nondistended. BS +. No masses.  Imaging/Labs: as above  Latest Ref Rng & Units 07/04/2023    1:44 PM 06/24/2023   11:55 PM 06/24/2023    5:19 AM  CBC  WBC 4.0 - 10.5 K/uL 7.3  13.2  6.5   Hemoglobin 13.0 - 17.0 g/dL 16.1  09.6  9.7   Hematocrit 39.0 - 52.0 % 34.9  35.4  31.7   Platelets 150 - 400 K/uL 299  258  205    Lab Results  Component Value Date   IRON 47 07/04/2023   TIBC 329 07/04/2023   FERRITIN 183 07/04/2023    I personally reviewed and interpreted the available labs, imaging and endoscopic files.  Impression and Plan:  Cole Brown is a 78 y.o. 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  No further overt hematochezia.   Surveillance upper endoscopy demonstrated healing about large peptic ulcer which was deemed secondary to significant NSAID use  Patient had an incomplete colonoscopy last year after which underwent partial colectomy and repair of colovesical fistula on 06/21/2023.  Path  negative for malignancy but demonstrated acute diverticulitis with abscess and fistulous track..  I reached out to Dr. Lovell Sheehan regarding timing of colonoscopy and is okay to go for colonoscopy 3 months from the time of surgery  I discussed with patient and patient by that without full colonoscopy;  workup of iron deficiency anemia is not a complete as entire colon was not examined and need to rule out any lesion such as polyp or even malignancy on right colon .    They would like to hold onto colonoscopy for next few months and revisit in future clinic visit   MiraLAX daily to be taken as patient is taking iron tablets and preventing constipation is imperative   # Peptic ulcer disease   Patient had multiple Forrest 3 gastric ulcers and 1 large duodenal Forrest Ia. Likely due to NSAID induced.    Recent upper endoscopy demonstrated healed ulcer   recs: -Recommend to take only Tylenol less than 3 g a day - Pantoprazole 40 mg daily - No high dose aspirin, ibuprofen, naproxen, or other non- steroidal anti- inflammatory drugs indefinitely.  All questions were answered.      [name], MD Gastroenterology and Hepatology [facility]   This chart has been completed using [facility] Dictation software, and while attempts have been made to ensure accuracy , certain words and phrases may not be transcribed as intended

## 2023-09-09 NOTE — Patient Instructions (Signed)
 It was very nice to meet you today, as dicussed with will plan for the following :  1) Ensure adequate fluid intake: Aim for 8 glasses of water daily. Follow a high fiber diet: Include foods such as dates, prunes, pears, and kiwi. Use Metamucil once a day.  2)Stop using high dose aspirin including Goody/BC powders, NSAIDs such as Aleve, ibuprofen, naproxen, Motrin, Voltaren or Advil (even the topical ones)  3) See [place] in 2 months to schedule colonoscopy

## 2023-09-30 DIAGNOSIS — D2261 Melanocytic nevi of right upper limb, including shoulder: Secondary | ICD-10-CM | POA: Diagnosis not present

## 2023-09-30 DIAGNOSIS — L853 Xerosis cutis: Secondary | ICD-10-CM | POA: Diagnosis not present

## 2023-09-30 DIAGNOSIS — H353211 Exudative age-related macular degeneration, right eye, with active choroidal neovascularization: Secondary | ICD-10-CM | POA: Diagnosis not present

## 2023-09-30 DIAGNOSIS — L570 Actinic keratosis: Secondary | ICD-10-CM | POA: Diagnosis not present

## 2023-09-30 DIAGNOSIS — L821 Other seborrheic keratosis: Secondary | ICD-10-CM | POA: Diagnosis not present

## 2023-09-30 DIAGNOSIS — L814 Other melanin hyperpigmentation: Secondary | ICD-10-CM | POA: Diagnosis not present

## 2023-10-09 ENCOUNTER — Inpatient Hospital Stay: Payer: Medicare HMO | Attending: Hematology

## 2023-10-09 DIAGNOSIS — D500 Iron deficiency anemia secondary to blood loss (chronic): Secondary | ICD-10-CM | POA: Diagnosis not present

## 2023-10-09 DIAGNOSIS — K922 Gastrointestinal hemorrhage, unspecified: Secondary | ICD-10-CM | POA: Diagnosis not present

## 2023-10-09 DIAGNOSIS — D649 Anemia, unspecified: Secondary | ICD-10-CM

## 2023-10-09 DIAGNOSIS — E538 Deficiency of other specified B group vitamins: Secondary | ICD-10-CM | POA: Insufficient documentation

## 2023-10-09 LAB — CBC WITH DIFFERENTIAL/PLATELET
Abs Immature Granulocytes: 0.01 10*3/uL (ref 0.00–0.07)
Basophils Absolute: 0.1 10*3/uL (ref 0.0–0.1)
Basophils Relative: 1 %
Eosinophils Absolute: 0.1 10*3/uL (ref 0.0–0.5)
Eosinophils Relative: 2 %
HCT: 38 % — ABNORMAL LOW (ref 39.0–52.0)
Hemoglobin: 12.7 g/dL — ABNORMAL LOW (ref 13.0–17.0)
Immature Granulocytes: 0 %
Lymphocytes Relative: 23 %
Lymphs Abs: 1.2 10*3/uL (ref 0.7–4.0)
MCH: 30 pg (ref 26.0–34.0)
MCHC: 33.4 g/dL (ref 30.0–36.0)
MCV: 89.6 fL (ref 80.0–100.0)
Monocytes Absolute: 0.5 10*3/uL (ref 0.1–1.0)
Monocytes Relative: 9 %
Neutro Abs: 3.5 10*3/uL (ref 1.7–7.7)
Neutrophils Relative %: 65 %
Platelets: 180 10*3/uL (ref 150–400)
RBC: 4.24 MIL/uL (ref 4.22–5.81)
RDW: 14.6 % (ref 11.5–15.5)
WBC: 5.4 10*3/uL (ref 4.0–10.5)
nRBC: 0 % (ref 0.0–0.2)

## 2023-10-09 LAB — FERRITIN: Ferritin: 247 ng/mL (ref 24–336)

## 2023-10-09 LAB — IRON AND TIBC
Iron: 61 ug/dL (ref 45–182)
Saturation Ratios: 20 % (ref 17.9–39.5)
TIBC: 309 ug/dL (ref 250–450)
UIBC: 248 ug/dL

## 2023-10-09 LAB — VITAMIN B12: Vitamin B-12: 346 pg/mL (ref 180–914)

## 2023-10-15 NOTE — Progress Notes (Addendum)
 [facility] [address], [place] [address]   CLINIC:  Medical Oncology/Hematology  PCP:  Minus Amel, MD [address] [place] [address] [phone]   REASON FOR VISIT:  Follow-up for iron  deficiency anemia  PRIOR THERAPY: PRBC transfusion  CURRENT THERAPY: Intermittent IV iron   INTERVAL HISTORY:   Cole Brown 78 y.o. 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  He was last seen by NP Cole Brown on 07/11/2023.  Most recent IV iron  with INFeD  x 1000 mg on 07/18/2023. He is accompanied today by Cole Brown.  In the interim since his last visit, he had EGD by Dr. Alita Brown on 08/01/2023, which showed normal esophagus.  Erythematous mucosa of stomach was biopsied.  Normal duodenum.  No active bleeding.  At today's visit, he reports feeling fairly well.  His energy continues to improve after IV iron  repletion, although he is not quite back to his baseline level of activity.  He is able to complete ADLs and IADLs, but has not yet resumed his more active hobbies in the shop.  He denies any pica, headaches, lightheadedness, syncope, chest pain, or dyspnea on exertion.  He has not noticed any rectal bleeding or melena.  He continues to take daily iron  and B12 supplements.  He has 50% energy and 100% appetite. He endorses that he is maintaining a stable weight.   ASSESSMENT & PLAN:  1.  Normocytic anemia from blood loss: - Severe anemia secondary to GI bleeding since July 2024 requiring blood transfusions. Last admission on 02/12/2023 for GI hemorrhage with acute blood loss anemia. - EGD on 02/15/2023 with multiple ulcers seen in the stomach and duodenum, s/p cauterization and clipping.  He has received 5 units PRBC. - Sigmoidoscopy on 02/15/2023 due to inadequate preparation. 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  Stricture was not able to be traversed with changing 3  scopes, pediatric, ultraslim and spaghetti upper scope.  Malignancy not excluded.  Nonbleeding external and internal hemorrhoids. 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  Pathology showed no evidence of dysplasia or carcinoma.  There was also hemorrhagic polyp on portion of colon that was removed. 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  SPEP and light chains negative, immunofixation with polyclonal gammopathy. - Most recent IV iron  with INFeD  x 1000 mg on 07/18/2023. - He is taking daily iron  pill - No rectal bleeding or melena  - Most recent labs (10/09/2023): Hgb 12.7/MCV 89.6, ferritin 247, iron  saturation 20%. -- He may also have some mild anemia of chronic disease related to his autoimmune issues - PLAN: Continue daily iron  tablet.  Continue surveillance of mild anemia with history of iron  deficiency. - Labs and RTC in 4 months   2.  Vitamin B12 deficiency - He is taking vitamin B12 1000 mcg daily - Most recent labs (10/09/2023): Vitamin B12 346, MMA not checked. - PLAN: Continue vitamin B12 1000 mcg daily.   Recheck B12/MMA at follow-up in 4 months.  3.  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  He has intermittent [name] phenomena. - Patient had positive antibeta-2 glycoprotein 1 IgM antibody (greater than 112) twice in March and July of this year.  He also has anticardiolipin antibody IgM positive (greater than 112) on 2 different occasions  at least 3 months apart.  Lupus anticoagulant was negative twice. - He does not have any history of arterial or venous thrombosis.  No history of strokes. - Patients with positive antiphospholipid antibody and other connective tissue disorders are at increased risk for thrombosis based on several studies, but full dose  anticoagulation is not indicated in the absence of prior thrombotic events. - He was placed on aspirin  325 mg for primary prophylaxis, but this was discontinued following his acute GI bleeding in 2024  - PLAN: Could consider aspirin  81 mg daily for primary prophylaxis of VTE, if gastroenterologist approves. - We have discussed symptoms of DVT/PE that would warrant immediate medical evaluation.   4.  Social/family history: - He used to work in Manufacturing engineer company prior to retirement.  Lives at home with his wife. - Non-smoker. - Exposure to epoxy and cement mixtures. - No family history of lupus anticoagulant or miscarriages or DVTs. - Father had prostate cancer and colon cancer.  Her had pancreatic cancer.  Several paternal aunts had cancer, unknown type to the patient.  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 Cheree Brown. 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 It is not causing any current issues, but does predispose patient to risk of MDS or AML in the future. Recommend indefinite follow-up every 6 months for CBC/D and LDH.  If patient develops any macrocytosis, cytopenias, or monocytosis, this could indicate need for additional workup.     PLAN SUMMARY: >> Labs in 4 months = CBC/D, CMP, ferritin, iron /TIBC, B12, MMA >> OFFICE visit in 4 months (1 week after labs)     REVIEW OF SYSTEMS:   Review of Systems  Constitutional:  Positive for fatigue. Negative for appetite change, chills, diaphoresis, fever and unexpected weight change.  HENT:   Negative for lump/mass and nosebleeds.   Eyes:  Negative for eye problems.  Respiratory:  Negative for cough, hemoptysis and shortness of breath.   Cardiovascular:  Negative for chest pain, leg swelling and palpitations.  Gastrointestinal:  Negative for abdominal pain, blood in stool, constipation, diarrhea, nausea and vomiting.   Genitourinary:  Negative for hematuria.   Skin: Negative.   Neurological:  Negative for dizziness, headaches and light-headedness.  Hematological:  Does not bruise/bleed easily.     PHYSICAL EXAM:  ECOG PERFORMANCE STATUS: 1 - Symptomatic but completely ambulatory  Vitals:   10/16/23 0859 10/16/23 0904  BP: (!) 141/69 129/74  Pulse: (!) 51   Resp: 18   Temp: (!) 96.2 F (35.7 C)   SpO2: 100%    Filed Weights   10/16/23 0859  Weight: 178 lb 9.2 oz (81 kg)   Physical Exam Constitutional:      Appearance: Normal appearance. He is normal weight.  Cardiovascular:     Heart sounds: Normal heart sounds.  Pulmonary:     Breath sounds: Normal breath sounds.  Neurological:     General: No focal deficit present.     Mental Status: Mental status is at baseline.  Psychiatric:        Behavior: Behavior normal. Behavior is cooperative.     PAST MEDICAL/SURGICAL HISTORY:  Past Medical History:  Diagnosis Date   Anemia    Diverticula of colon    2016 colonoscopy   Fibromyalgia    Fibromyalgia    History of GI diverticular bleed    Hypercholesteremia    Hypertension    Multiple gastric ulcers  Sleep apnea    Past Surgical History:  Procedure Laterality Date   allergy shots  weekly   BIOPSY  02/15/2023   Procedure: BIOPSY;  Surgeon: Cole Garden, MD;  [facility];  Service: Gastroenterology;;   BIOPSY  08/01/2023   Procedure: BIOPSY;  Surgeon: Cole Lias, MD;  [facility];  Service: Endoscopy;;   CHOLECYSTECTOMY     COLONOSCOPY     Cole Brown   COLONOSCOPY N/A 01/27/2015   Rehman: multiple diverticula at sigmoid colon,ext hemorrhoids   COLONOSCOPY WITH PROPOFOL  N/A 02/15/2023   Procedure: COLONOSCOPY WITH PROPOFOL ;  Surgeon: Cole Garden, MD;  [facility];  Service: Gastroenterology;  Laterality: N/A;   ESOPHAGOGASTRODUODENOSCOPY (EGD) WITH PROPOFOL  N/A 02/15/2023   Procedure: ESOPHAGOGASTRODUODENOSCOPY  (EGD) WITH PROPOFOL ;  Surgeon: Cole Garden, MD;  [facility];  Service: Gastroenterology;  Laterality: N/A;   ESOPHAGOGASTRODUODENOSCOPY (EGD) WITH PROPOFOL  N/A 08/01/2023   Procedure: ESOPHAGOGASTRODUODENOSCOPY (EGD) WITH PROPOFOL ;  Surgeon: Cole Lias, MD;  [facility];  Service: Endoscopy;  Laterality: N/A;  8:45AM;ASA 3, pt knows to arrive at 6:15   EYE SURGERY Bilateral    partial cornea transplants    FLEXIBLE SIGMOIDOSCOPY  03/14/2023   Procedure: FLEXIBLE SIGMOIDOSCOPY;  Surgeon: Cole Lias, MD;  [facility];  Service: Endoscopy;;   GALLBLADDER SURGERY  12/1993   HOT HEMOSTASIS  02/15/2023   Procedure: HOT HEMOSTASIS (ARGON PLASMA COAGULATION/BICAP);  Surgeon: Cole Brown, Cole Limes, MD;  [facility];  Service: Gastroenterology;;   PARTIAL COLECTOMY N/A 06/21/2023   Procedure: PARTIAL COLECTOMY,  repair colovesicle fistula;  Surgeon: Cole Allegra, MD;  Location: [facility];  Service: General;  Laterality: N/A;   SCLEROTHERAPY  02/15/2023   Procedure: Cole Brown;  Surgeon: Cole Brown, Cole Limes, MD;  [facility];  Service: Gastroenterology;;   UPPER GASTROINTESTINAL ENDOSCOPY      SOCIAL HISTORY:  Social History   Socioeconomic History   Marital status: Married    Spouse name: Not on file   Number of children: Not on file   Years of education: Not on file   Highest education level: Not on file  Occupational History   Not on file  Tobacco Use   Smoking status: Never    Passive exposure: Never   Smokeless tobacco: Never  Vaping Use   Vaping status: Never Used  Substance and Sexual Activity   Alcohol use: Not Currently   Drug use: No   Sexual activity: Not on file  Other Topics Concern   Not on file  Social History Narrative   Not on file   Social Drivers of Health   Financial Resource Strain: Not on file  Food Insecurity: No Food Insecurity (06/22/2023)   Hunger Vital Sign     Worried About Running Out of Food in the Last Year: Never true    Ran Out of Food in the Last Year: Never true  Transportation Needs: No Transportation Needs (06/22/2023)   PRAPARE - Administrator, Civil Service (Medical): No    Lack of Transportation (Non-Medical): No  Physical Activity: Not on file  Stress: Not on file  Social Connections: Socially Isolated (06/22/2023)   Social Connection and Isolation Panel [NHANES]    Frequency of Communication with Friends and Family: Never    Frequency of Social Gatherings with Friends and Family: Never    Attends Religious Services: Never    Database administrator or Organizations: No  Attends Banker Meetings: Never    Marital Status: Married  Catering manager Violence: Not At Risk (06/22/2023)   Humiliation, Afraid, Rape, and Kick questionnaire    Fear of Current or Ex-Partner: No    Emotionally Abused: No    Physically Abused: No    Sexually Abused: No    FAMILY HISTORY:  Family History  Problem Relation Age of Onset   Heart disease Mother    Pancreatic cancer Mother    Prostate cancer Father    Healthy Sister    Parkinson's disease Brother    Asthma Sister    Healthy Sister     CURRENT MEDICATIONS:  Outpatient Encounter Medications as of 10/16/2023  Medication Sig   acetaminophen  (TYLENOL ) 500 MG tablet Take 1,000 mg by mouth every 6 (six) hours as needed (pain.).   amLODipine  (NORVASC ) 5 MG tablet Take 1 tablet (5 mg total) by mouth daily.   atenolol  (TENORMIN ) 25 MG tablet TAKE (1/2) TABLET BY MOUTH ONCE DAILY. (Patient taking differently: Take 12.5 mg by mouth every evening.)   azelastine  (ASTELIN ) 0.1 % nasal spray Place 1 spray into both nostrils 2 (two) times daily as needed for allergies.   Bevacizumab  (AVASTIN  IV) Place into the right eye.   Cholecalciferol  (VITAMIN D ) 50 MCG (2000 UT) CAPS Take 2,000 Units by mouth daily.   cyanocobalamin  (VITAMIN B12) 1000 MCG tablet Take 1,000 mcg by mouth daily.    diphenhydrAMINE  (BENADRYL ) 25 MG tablet Take 25 mg by mouth daily as needed for allergies.   diphenhydrAMINE -zinc acetate (BENADRYL ) cream Apply topically daily.   EPINEPHrine  0.3 mg/0.3 mL IJ SOAJ injection Inject 0.3 mg into the muscle as needed for anaphylaxis.   faricimab -svoa (VABYSMO ) 6 MG/0.05ML SOSY intravitreal injection 6 mg by Intravitreal route every 30 (thirty) days.   FLUoxetine  (PROZAC ) 10 MG tablet Take 10 mg by mouth in the morning.   fluticasone  (FLONASE ) 50 MCG/ACT nasal spray Place 1-2 sprays into both nostrils daily as needed for allergies.   folic acid  (FOLVITE ) 1 MG tablet TAKE 2 TABLETS BY MOUTH DAILY.   hydrocortisone  1 % ointment Apply 1 Application topically daily.   Investigational - Study Medication Take 40 mg by mouth in the morning. Atorvastatin study med. PREVENTABLE trial   levocetirizine (XYZAL ) 5 MG tablet Take 5 mg by mouth every evening.    montelukast  (SINGULAIR ) 10 MG tablet Take 10 mg by mouth at bedtime.   Multiple Vitamins-Minerals (ICAPS AREDS 2 PO) Take 1 capsule by mouth in the morning.   Omega-3 Fatty Acids (FISH OIL) 1000 MG CAPS Take 1,000 mg by mouth in the morning.   pantoprazole  (PROTONIX ) 40 MG tablet Take 1 tablet (40 mg total) by mouth daily.   TESTOSTERONE  COMPOUNDING KIT TD Apply 4 Pump topically every evening. 10% compounded gel from [facility] (4 clicks)   triazolam  (HALCION ) 0.25 MG tablet Take 0.25 mg by mouth at bedtime.    [DISCONTINUED] furosemide  (LASIX ) 20 MG tablet Take 20 mg by mouth as needed. In the morning.   [DISCONTINUED] HYDROcodone -acetaminophen  (NORCO) 5-325 MG tablet Take 1 tablet by mouth every 6 (six) hours as needed for moderate pain (pain score 4-6).   [DISCONTINUED] polyethylene glycol powder (GLYCOLAX /MIRALAX ) 17 GM/SCOOP powder Take 8.5 g by mouth daily.   Facility-Administered Encounter Medications as of 10/16/2023  Medication   octreotide  (SANDOSTATIN  LAR) 30 MG IM injection    ALLERGIES:   Allergies  Allergen Reactions   Bee Pollen     Seasonal allergies  Plaquenil  [Hydroxychloroquine  Sulfate]     rash   Lodine [Etodolac] Swelling and Rash    LABORATORY DATA:  I have reviewed the labs as listed.  CBC    Component Value Date/Time   WBC 5.4 10/09/2023 1321   RBC 4.24 10/09/2023 1321   HGB 12.7 (L) 10/09/2023 1321   HGB 7.3 (L) 04/25/2023 1623   HCT 38.0 (L) 10/09/2023 1321   HCT 25.2 (L) 04/25/2023 1623   PLT 180 10/09/2023 1321   PLT 343 04/25/2023 1623   MCV 89.6 10/09/2023 1321   MCV 79 04/25/2023 1623   MCH 30.0 10/09/2023 1321   MCHC 33.4 10/09/2023 1321   RDW 14.6 10/09/2023 1321   RDW 17.9 (H) 04/25/2023 1623   LYMPHSABS 1.2 10/09/2023 1321   LYMPHSABS 0.9 03/09/2023 1610   MONOABS 0.5 10/09/2023 1321   EOSABS 0.1 10/09/2023 1321   EOSABS 0.4 03/09/2023 1610   BASOSABS 0.1 10/09/2023 1321   BASOSABS 0.1 03/09/2023 1610      Latest Ref Rng & Units 06/24/2023   11:55 PM 06/24/2023    5:19 AM 06/23/2023    4:35 AM  CMP  Glucose 70 - 99 mg/dL 409  811  88   BUN 8 - 23 mg/dL 21  12  11    Creatinine 0.61 - 1.24 mg/dL 9.14  7.82  9.56   Sodium 135 - 145 mmol/L 129  130  132   Potassium 3.5 - 5.1 mmol/L 4.3  3.7  3.7   Chloride 98 - 111 mmol/L 97  100  101   CO2 22 - 32 mmol/L 24  23  25    Calcium 8.9 - 10.3 mg/dL 9.0  8.6  8.5   Total Protein 6.5 - 8.1 g/dL 7.0     Total Bilirubin 0.0 - 1.2 mg/dL 0.8     Alkaline Phos 38 - 126 U/L 94     AST 15 - 41 U/L 20     ALT 0 - 44 U/L 26       DIAGNOSTIC IMAGING:  I have independently reviewed the relevant imaging and discussed with the patient.   WRAP UP:  All questions were answered. The patient knows to call the clinic with any problems, questions or concerns.  Medical decision making: Moderate  Time spent on visit: I spent 20 minutes counseling the patient face to face. The total time spent in the appointment was 30 minutes and more than 50% was on counseling.  Cole Dusky, PA-C   10/16/23 10:02 AM

## 2023-10-16 ENCOUNTER — Inpatient Hospital Stay: Payer: Medicare HMO | Admitting: Physician Assistant

## 2023-10-16 VITALS — BP 129/74 | HR 51 | Temp 96.2°F | Resp 18 | Wt 178.6 lb

## 2023-10-16 DIAGNOSIS — K922 Gastrointestinal hemorrhage, unspecified: Secondary | ICD-10-CM | POA: Diagnosis not present

## 2023-10-16 DIAGNOSIS — R760 Raised antibody titer: Secondary | ICD-10-CM

## 2023-10-16 DIAGNOSIS — D500 Iron deficiency anemia secondary to blood loss (chronic): Secondary | ICD-10-CM | POA: Diagnosis not present

## 2023-10-16 DIAGNOSIS — E538 Deficiency of other specified B group vitamins: Secondary | ICD-10-CM

## 2023-10-16 NOTE — Patient Instructions (Signed)
 [facility] at [facility] **VISIT SUMMARY & IMPORTANT INSTRUCTIONS **   You were seen today by Sheril Dines PA-C for your follow-up visit.    IRON  DEFICIENCY ANEMIA Your blood and iron  levels look much better! Your mild residual anemia is likely related to chronic inflammation. Continue taking your iron  tablet daily.  VITAMIN B12 DEFICIENCY Continue taking vitamin B12 1000 mcg daily.  FOLLOW-UP APPOINTMENT: Labs in 4 months followed by office visit 1 week later  ** Thank you for trusting me with your healthcare!  I strive to provide all of my patients with quality care at each visit.  If you receive a survey for this visit, I would be so grateful to you for taking the time to provide feedback.  Thank you in advance!  ~ Marvalene Barrett                   Dr. Paulett Boros   &   Sheril Dines, PA-C   - - - - - - - - - - - - - - - - - -    Thank you for choosing [facility] at [facility] to provide your oncology and hematology care.  To afford each patient quality time with our provider, please arrive at least 15 minutes before your scheduled appointment time.   If you have a lab appointment with the Cancer Center please come in thru the Main Entrance and check in at the main information desk.  You need to re-schedule your appointment should you arrive 10 or more minutes late.  We strive to give you quality time with our providers, and arriving late affects you and other patients whose appointments are after yours.  Also, if you no show three or more times for appointments you may be dismissed from the clinic at the providers discretion.     Again, thank you for choosing [facility].  Our hope is that these requests will decrease the amount of time that you wait before being seen by our physicians.       _____________________________________________________________  Should you have questions after your visit to [facility], please contact our office at [phone] and follow the prompts.  Our office hours are 8:00 a.m. and 4:30 p.m. Monday - Friday.  Please note that voicemails left after 4:00 p.m. may not be returned until the following business day.  We are closed weekends and major holidays.  You do have access to a nurse 24-7, just call the main number to the clinic [phone] and do not press any options, hold on the line and a nurse will answer the phone.    For prescription refill requests, have your pharmacy contact our office and allow 72 hours.

## 2023-10-28 DIAGNOSIS — H353122 Nonexudative age-related macular degeneration, left eye, intermediate dry stage: Secondary | ICD-10-CM | POA: Diagnosis not present

## 2023-10-28 DIAGNOSIS — H353211 Exudative age-related macular degeneration, right eye, with active choroidal neovascularization: Secondary | ICD-10-CM | POA: Diagnosis not present

## 2023-10-28 DIAGNOSIS — H43813 Vitreous degeneration, bilateral: Secondary | ICD-10-CM | POA: Diagnosis not present

## 2023-10-28 DIAGNOSIS — Z961 Presence of intraocular lens: Secondary | ICD-10-CM | POA: Diagnosis not present

## 2023-10-28 DIAGNOSIS — H35033 Hypertensive retinopathy, bilateral: Secondary | ICD-10-CM | POA: Diagnosis not present

## 2023-11-12 ENCOUNTER — Ambulatory Visit (INDEPENDENT_AMBULATORY_CARE_PROVIDER_SITE_OTHER): Admitting: Gastroenterology

## 2023-11-15 ENCOUNTER — Other Ambulatory Visit: Payer: Self-pay | Admitting: Physician Assistant

## 2023-11-15 DIAGNOSIS — M359 Systemic involvement of connective tissue, unspecified: Secondary | ICD-10-CM

## 2023-11-15 NOTE — Telephone Encounter (Signed)
 Last Fill: 10/12/2022  Next Visit: 12/25/2023  Last Visit: 08/21/2023  Dx: Autoimmune disease (HCC)   Current Dose per office note on 08/21/2023: not mentioned  Okay to refill Folic Acid ?

## 2023-11-18 ENCOUNTER — Ambulatory Visit (INDEPENDENT_AMBULATORY_CARE_PROVIDER_SITE_OTHER): Admitting: Gastroenterology

## 2023-11-18 ENCOUNTER — Encounter (INDEPENDENT_AMBULATORY_CARE_PROVIDER_SITE_OTHER): Payer: Self-pay | Admitting: Gastroenterology

## 2023-11-18 VITALS — BP 159/79 | HR 55 | Temp 97.7°F | Ht 71.0 in | Wt 178.6 lb

## 2023-11-18 DIAGNOSIS — Z8719 Personal history of other diseases of the digestive system: Secondary | ICD-10-CM

## 2023-11-18 DIAGNOSIS — D509 Iron deficiency anemia, unspecified: Secondary | ICD-10-CM | POA: Diagnosis not present

## 2023-11-18 DIAGNOSIS — Z862 Personal history of diseases of the blood and blood-forming organs and certain disorders involving the immune mechanism: Secondary | ICD-10-CM

## 2023-11-18 DIAGNOSIS — R197 Diarrhea, unspecified: Secondary | ICD-10-CM

## 2023-11-18 DIAGNOSIS — R195 Other fecal abnormalities: Secondary | ICD-10-CM | POA: Insufficient documentation

## 2023-11-18 DIAGNOSIS — Z9049 Acquired absence of other specified parts of digestive tract: Secondary | ICD-10-CM

## 2023-11-18 DIAGNOSIS — K5730 Diverticulosis of large intestine without perforation or abscess without bleeding: Secondary | ICD-10-CM

## 2023-11-18 NOTE — Patient Instructions (Addendum)
 We will get you scheduled for Colonoscopy with Dr. Alita Irwin Please discuss body pain with your rheumatologist Your iron  and hemoglobin look good, please continue to follow with hematology  Follow up 4 months  It was a pleasure to see you today. I want to create trusting relationships with patients and provide genuine, compassionate, and quality care. I truly value your feedback! please be on the lookout for a survey regarding your visit with me today. I appreciate your input about our visit and your time in completing this!    Cole Brown L. Rosaelena Kemnitz, MSN, APRN, AGNP-C Adult-Gerontology Nurse Practitioner [facility] at [facility]

## 2023-11-18 NOTE — Progress Notes (Signed)
 Referring Provider: Minus Amel, MD Primary Care Physician:  Minus Amel, MD Primary GI Physician: Dr. Alita Irwin   Chief Complaint  Patient presents with   Follow-up    Pt arrives for follow up. Has been having some intestinal problems since end of April. Has slowed down at this time. Lots of stomach growling and visits to the bathroom within the hour. Pt states he is having loose stools. Wife states there are stains in underwear that are reddish brown in color. Also having same kind of symptoms as last year with lying in bed, covered up with blankets and taking Tylenol . Pt taking 4 650 mg Tylenol  at 6pm and another 4 tablets at 9:30pm along with other night time medic   HPI:   Cole Brown is a 78 y.o. 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 Taking iron  daily, 1 soft BM daily, taking tylenol  a few times per day   Recommended colonoscopy in April though patient declined for now, wished to revisit in future clinic visit, continue miralax  and iron  tablets, continue to avoid NSAIDs, protonix  40mg  daily  Last labs 10/09/23 with hgb 12.7, ferritin 247, iron  61, tibc 309, sat 20   Present:  States 2-3 looser stools per day since February. He denies any abdominal pain. Denies rectal bleeding or melena. Wife reports some "stains" in his underwear that were worse towards the end of April but have seemed to taper off more recently, appears to be browning orange in color, no obvious blood. Appetite is good. Weight is  stable. No nausea or vomiting. States he has felt a "protrusion" in his abdomen at times but no pain.  Wife reports he started to have some full body pain, diagnosed in the past with fibromyalgia. Reports that he has had some intermittent body pain where he will need tylenol  and to go lay down and cover up in bed due to his pain. Patient notes that he has pain almost daily though does not seem to be as severe as it was previously. Wife concerned as last time he had pain like this was around the time he was diagnosed with the PUD. He is seeing his rheumatologist with pending future inflammatory markers per chart review   Partial colectomy 06/21/23: A. SIGMOID COLON, RESECTION (18 cm):  Diverticulosis showing acute diverticulitis with pericolic abscess and  granulation tissue compatible with a fistula tract  Benign incidental ulcerated inflammatory polyp  Proximal and distal margins viable and without acute inflammation  Last EGD:07/2023 - Normal esophagus.                           - Erythematous mucosa in the stomach. Biopsied.                           - Normal duodenal bulb and second portion of the                            duodenum.  A.  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 This stricture was not able to be traversed with changing 3 scopes; peadiatric , Ultraslim colonoscope and spaghetti upper scope. Malignancy is not excluded - Non- bleeding external and internal hemorrhoids. - No specimens collected.    Filed Weights   11/18/23 1413  Weight: 178 lb 9.6 oz (81 kg)     Past Medical History:  Diagnosis Date   Anemia    Diverticula of colon    2016 colonoscopy   Fibromyalgia    Fibromyalgia    History of GI  diverticular bleed    Hypercholesteremia    Hypertension    Multiple gastric ulcers    Sleep apnea     Past Surgical History:  Procedure Laterality Date   allergy shots  weekly   BIOPSY  02/15/2023   Procedure: BIOPSY;  Surgeon: Urban Garden, MD;  [facility];  Service: Gastroenterology;;   BIOPSY  08/01/2023   Procedure: BIOPSY;  Surgeon: Hargis Lias, MD;  [facility];  Service: Endoscopy;;   CHOLECYSTECTOMY     COLONOSCOPY     Dr.Rehman   COLONOSCOPY N/A 01/27/2015   Rehman: multiple diverticula at sigmoid colon,ext hemorrhoids   COLONOSCOPY WITH PROPOFOL  N/A 02/15/2023   Procedure: COLONOSCOPY WITH PROPOFOL ;  Surgeon: Urban Garden, MD;  [facility];  Service: Gastroenterology;  Laterality: N/A;   ESOPHAGOGASTRODUODENOSCOPY (EGD) WITH PROPOFOL  N/A 02/15/2023   Procedure: ESOPHAGOGASTRODUODENOSCOPY (EGD) WITH PROPOFOL ;  Surgeon: Urban Garden, MD;  [facility];  Service: Gastroenterology;  Laterality: N/A;   ESOPHAGOGASTRODUODENOSCOPY (EGD) WITH PROPOFOL  N/A 08/01/2023   Procedure: ESOPHAGOGASTRODUODENOSCOPY (EGD) WITH PROPOFOL ;  Surgeon: Hargis Lias, MD;  [facility];  Service: Endoscopy;  Laterality: N/A;  8:45AM;ASA 3, pt knows to arrive at 6:15   EYE SURGERY Bilateral    partial cornea transplants    FLEXIBLE SIGMOIDOSCOPY  03/14/2023   Procedure: FLEXIBLE SIGMOIDOSCOPY;  Surgeon: Hargis Lias, MD;  [facility];  Service: Endoscopy;;   GALLBLADDER SURGERY  12/1993   HOT HEMOSTASIS  02/15/2023   Procedure: HOT HEMOSTASIS (ARGON PLASMA COAGULATION/BICAP);  Surgeon: Umberto Ganong, Bearl Limes, MD;  [facility];  Service: Gastroenterology;;   PARTIAL COLECTOMY N/A 06/21/2023   Procedure: PARTIAL COLECTOMY,  repair colovesicle fistula;  Surgeon: Alanda Allegra, MD;  [facility]: [facility];  Service: General;  Laterality: N/A;   SCLEROTHERAPY  02/15/2023    Procedure: SCLEROTHERAPY;  Surgeon: Umberto Ganong, Bearl Limes, MD;  [facility];  Service: Gastroenterology;;   UPPER GASTROINTESTINAL ENDOSCOPY      Current Outpatient Medications  Medication Sig Dispense Refill   acetaminophen  (TYLENOL ) 500 MG tablet Take 1,000 mg by mouth every 6 (six) hours as needed (pain.).     amLODipine  (NORVASC ) 5 MG tablet Take 1 tablet (5 mg total) by mouth daily. 90 tablet 3   atenolol  (TENORMIN ) 25 MG tablet TAKE (1/2) TABLET BY MOUTH ONCE DAILY. (Patient taking differently: Take 12.5 mg by mouth every evening.) 45 tablet 2   azelastine  (ASTELIN ) 0.1 % nasal spray Place 1 spray into both nostrils 2 (two) times daily as needed for allergies.     Bevacizumab  (AVASTIN  IV) Place into the right eye.     Cholecalciferol  (VITAMIN D ) 50 MCG (2000  UT) CAPS Take 2,000 Units by mouth daily.     cyanocobalamin  (VITAMIN B12) 1000 MCG tablet Take 1,000 mcg by mouth daily.     diphenhydrAMINE  (BENADRYL ) 25 MG tablet Take 25 mg by mouth daily as needed for allergies.     diphenhydrAMINE -zinc acetate (BENADRYL ) cream Apply topically daily.     EPINEPHrine  0.3 mg/0.3 mL IJ SOAJ injection Inject 0.3 mg into the muscle as needed for anaphylaxis.     faricimab -svoa (VABYSMO ) 6 MG/0.05ML SOSY intravitreal injection 6 mg by Intravitreal route every 30 (thirty) days.     FLUoxetine  (PROZAC ) 10 MG tablet Take 10 mg by mouth in the morning.     fluticasone  (FLONASE ) 50 MCG/ACT nasal spray Place 1-2 sprays into both nostrils daily as needed for allergies.     folic acid  (FOLVITE ) 1 MG tablet TAKE 2 TABLETS BY MOUTH DAILY. 180 tablet 3   hydrocortisone  1 % ointment Apply 1 Application topically daily.     Investigational - Study Medication Take 40 mg by mouth in the morning. Atorvastatin study med. PREVENTABLE trial     levocetirizine (XYZAL ) 5 MG tablet Take 5 mg by mouth every evening.      montelukast  (SINGULAIR ) 10 MG tablet Take 10 mg by mouth at bedtime.     Multiple  Vitamins-Minerals (ICAPS AREDS 2 PO) Take 1 capsule by mouth in the morning.     Omega-3 Fatty Acids (FISH OIL) 1000 MG CAPS Take 1,000 mg by mouth in the morning.     pantoprazole  (PROTONIX ) 40 MG tablet Take 1 tablet (40 mg total) by mouth daily. 90 tablet 3   TESTOSTERONE  COMPOUNDING KIT TD Apply 4 Pump topically every evening. 10% compounded gel from [facility] (4 clicks)     triazolam  (HALCION ) 0.25 MG tablet Take 0.25 mg by mouth at bedtime.      No current facility-administered medications for this visit.   Facility-Administered Medications Ordered in Other Visits  Medication Dose Route Frequency Provider Last Rate Last Admin   octreotide  (SANDOSTATIN  LAR) 30 MG IM injection             Allergies as of 11/18/2023 - Review Complete 11/18/2023  Allergen Reaction Noted   Bee pollen  02/12/2023   Plaquenil  [hydroxychloroquine  sulfate]  11/06/2018   Lodine [etodolac] Swelling and Rash 01/29/2013    Social History   Socioeconomic History   Marital status: Married    Spouse name: Not on file   Number of children: Not on file   Years of education: Not on file   Highest education level: Not on file  Occupational History   Not on file  Tobacco Use   Smoking status: Never    Passive exposure: Never   Smokeless tobacco: Never  Vaping Use   Vaping status: Never Used  Substance and Sexual Activity   Alcohol use: Not Currently   Drug use: No   Sexual activity: Not on file  Other Topics Concern   Not on file  Social History Narrative   Not on file   Social Drivers of Health   Financial Resource Strain: Not on file  Food Insecurity: No Food Insecurity (06/22/2023)   Hunger Vital Sign    Worried About Running Out of Food in the Last Year: Never true    Ran Out of Food in the Last Year: Never true  Transportation Needs: No Transportation Needs (06/22/2023)   PRAPARE - Administrator, Civil Service (Medical): No    Lack of Transportation (  Non-Medical): No   Physical Activity: Not on file  Stress: Not on file  Social Connections: Socially Isolated (06/22/2023)   Social Connection and Isolation Panel [NHANES]    Frequency of Communication with Friends and Family: Never    Frequency of Social Gatherings with Friends and Family: Never    Attends Religious Services: Never    Diplomatic Services operational officer: No    Attends Engineer, structural: Never    Marital Status: Married    Review of systems General: negative for malaise, night sweats, fever, chills, weight loss Neck: Negative for lumps, goiter, pain and significant neck swelling Resp: Negative for cough, wheezing, dyspnea at rest CV: Negative for chest pain, leg swelling, palpitations, orthopnea GI: denies melena, hematochezia, nausea, vomiting, constipation, dysphagia, odyonophagia, early satiety or unintentional weight loss. +looser stools  MSK: Negative for joint pain or swelling, back pain +muscle pain  Derm: Negative for itching or rash Psych: Denies depression, anxiety, memory loss, confusion. No homicidal or suicidal ideation.  Heme: Negative for prolonged bleeding, bruising easily, and swollen nodes. Endocrine: Negative for cold or heat intolerance, polyuria, polydipsia and goiter. Neuro: negative for tremor, gait imbalance, syncope and seizures. The remainder of the review of systems is noncontributory.  Physical Exam: BP (!) 159/79   Pulse (!) 55   Temp 97.7 F (36.5 C)   Ht 5\' 11"  (1.803 m)   Wt 178 lb 9.6 oz (81 kg)   BMI 24.91 kg/m  General:   Alert and oriented. No distress noted. Pleasant and cooperative.  Head:  Normocephalic and atraumatic. Eyes:  Conjuctiva clear without scleral icterus. Mouth:  Oral mucosa pink and moist. Good dentition. No lesions. Heart: Normal rate and rhythm, s1 and s2 heart sounds present.  Lungs: Clear lung sounds in all lobes. Respirations equal and unlabored. Abdomen:  +BS, soft, non-tender and non-distended. Midline  lower abdominal scar, no obvious mass or hernia when patient lying down.  Derm: No palmar erythema or jaundice Msk:  Symmetrical without gross deformities. Normal posture. Extremities:  Without edema. Neurologic:  Alert and  oriented x4 Psych:  Alert and cooperative. Normal mood and affect.  Invalid input(s): "6 MONTHS"   ASSESSMENT: REED DADY is a 78 y.o. male presenting today for follow up of IDA, possible abdominal hernia and looser stools after recent partial colectomy due to recurrent diverticulitis with colovesical fistula  History of complicated diverticulitis: incomplete colonoscopy 02/2023 after which underwent partial colectomy and repair of colovesical fistula on 06/21/2023.  Path negative for malignancy but demonstrated acute diverticulitis with abscess and fistulous track..  had been recommended to have follow up colonoscopy 3 months after but patient wanted to hold off at last visit, he is amenable to scheduling colonoscopy today. He has no abdominal pain or bleeding but endorses some looser stools since January/February which likely may be from partial resection, which I discussed with the patient and his wife. Further evaluation to be done during colonoscopy to rule out other causes, could consider trial of colestipol if looser stools persists and no other findings to suggest cause. Indications, risks and benefits of procedure discussed in detail with patient. Patient verbalized understanding and is in agreement to proceed with Colonoscopy  IDA: stable, last hgb 12.3, maintained on PO iron  and following with hematology. No rectal bleeding or melena. Recent EGD as outlined above without presence of ulcers, scheduling upcoming colonoscopy as above  Possible abdominal hernia: pt notes painless, intermittent "bulge" just right of his colectomy scar, no  obvious masses/lesions or hernia noted on exam today though discussed this is likely a ventral hernia. Advised if pain, hardening of  the area, bruising or constipation associated with hernia occur, he should make me aware   In regards to his all over body pain, I suspect this is likely secondary to known fibromyalgia, I recommend they touch base with rheumatology regarding this as well as upcoming inflammatory markers.   PLAN:  -schedule colonoscopy ASAIII  - discuss inflammatory labs with rheumatology -avoid NSAIDs - continue iron  pill and to follow with hematology   All questions were answered, patient verbalized understanding and is in agreement with plan as outlined above.    Follow Up: 4 months   Ladarien Beeks L. Jayten Gabbard, MSN, APRN, AGNP-C Adult-Gerontology Nurse Practitioner [facility]

## 2023-11-19 ENCOUNTER — Encounter: Payer: Self-pay | Admitting: *Deleted

## 2023-11-19 ENCOUNTER — Telehealth (INDEPENDENT_AMBULATORY_CARE_PROVIDER_SITE_OTHER): Payer: Self-pay | Admitting: *Deleted

## 2023-11-19 ENCOUNTER — Telehealth: Payer: Self-pay | Admitting: Rheumatology

## 2023-11-19 ENCOUNTER — Other Ambulatory Visit: Payer: Self-pay | Admitting: *Deleted

## 2023-11-19 MED ORDER — PEG 3350-KCL-NA BICARB-NACL 420 G PO SOLR: 4000.0000 mL | Freq: Once | ORAL | 0 refills | Status: AC

## 2023-11-19 NOTE — Telephone Encounter (Signed)
 error

## 2023-11-19 NOTE — Telephone Encounter (Signed)
 [facility]  Colonoscopy w/Dr.Ahmed, ASA 3

## 2023-11-19 NOTE — Telephone Encounter (Signed)
 Pt has been scheduled for 12/26/23. Instructions mailed and prep sent to the pharmacy.  [employer] PA: Approved Authorization #[id number]  Tracking [id number] Dates of service 12/26/2023 - 03/26/2024

## 2023-11-20 ENCOUNTER — Encounter: Payer: Self-pay | Admitting: *Deleted

## 2023-11-25 DIAGNOSIS — H353211 Exudative age-related macular degeneration, right eye, with active choroidal neovascularization: Secondary | ICD-10-CM | POA: Diagnosis not present

## 2023-12-11 NOTE — Progress Notes (Unsigned)
 Office Visit Note  Patient: Cole Brown             Date of Birth: 08-11-1945           MRN: [id number]             PCP: Marvine Rush, MD Referring: Marvine Rush, MD Visit Date: 12/25/2023 Occupation: @GUAROCC @  Subjective:  Discuss results  History of Present Illness: GRANTLEY SAVAGE is a 78 y.o. male with history of autoimmune disease.  Patient is not currently taking immunosuppressive agents.  He discontinued methotrexate  in December 2024 due to recurrent infections.  Lab work obtained on 12/23/2023: ESR within normal limits, complements within normal limits  Activities of Daily Living:  Patient reports morning stiffness for *** {minute/hour:19697}.   Patient {ACTIONS;DENIES/REPORTS:21021675::Denies} nocturnal pain.  Difficulty dressing/grooming: {ACTIONS;DENIES/REPORTS:21021675::Denies} Difficulty climbing stairs: {ACTIONS;DENIES/REPORTS:21021675::Denies} Difficulty getting out of chair: {ACTIONS;DENIES/REPORTS:21021675::Denies} Difficulty using hands for taps, buttons, cutlery, and/or writing: {ACTIONS;DENIES/REPORTS:21021675::Denies}  No Rheumatology ROS completed.   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   Duodenal ulcer 02/16/2023   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   Gastrointestinal hemorrhage 02/15/2023   Protein-calorie malnutrition, severe 02/14/2023   Acute blood loss anemia 02/12/2023   Perforation of sigmoid colon (HCC) 01/08/2023   Acute post-hemorrhagic anemia 01/08/2023   Diverticulitis 01/03/2023   Hyponatremia 01/03/2023   Hematochezia 12/25/2022   Diverticulosis of colon without hemorrhage 12/25/2022   Diarrhea 03/30/2021   Hemorrhoids 03/30/2021   Antiphospholipid antibody  positive 04/18/2020   Lower abdominal pain 07/06/2019   Autoimmune disease (HCC) 09/15/2018   [name] disease without gangrene 09/15/2018   Hypercholesteremia 08/21/2018   History of diverticulitis 08/21/2018   History of iron  deficiency anemia 08/21/2018   Fibromyalgia 08/21/2018   History of multiple allergies 08/21/2018   History of sleep apnea 08/21/2018   Rectal bleed 11/14/2016   Essential hypertension 11/14/2016   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    Past Medical History:  Diagnosis Date   Anemia    Diverticula of colon    2016 colonoscopy   Fibromyalgia    Fibromyalgia    History of GI diverticular bleed    Hypercholesteremia    Hypertension    Multiple gastric ulcers    Sleep apnea     Family History  Problem Relation Age of Onset   Heart disease Mother    Pancreatic cancer Mother    Prostate cancer Father    Healthy Sister    Parkinson's disease Brother    Asthma Sister    Healthy Sister    Past Surgical History:  Procedure Laterality Date   allergy shots  weekly   BIOPSY  02/15/2023   Procedure: BIOPSY;  Surgeon: Eartha Angelia Sieving, MD;  [facility];  Service: Gastroenterology;;   BIOPSY  08/01/2023   Procedure: BIOPSY;  Surgeon: Cinderella Deatrice FALCON, MD;  [facility];  Service: Endoscopy;;   CHOLECYSTECTOMY     COLONOSCOPY     Dr.Rehman   COLONOSCOPY N/A 01/27/2015   Rehman: multiple diverticula at sigmoid colon,ext hemorrhoids   COLONOSCOPY WITH PROPOFOL  N/A 02/15/2023   Procedure: COLONOSCOPY WITH PROPOFOL ;  Surgeon: Eartha Angelia Sieving, MD;  [facility];  Service: Gastroenterology;  Laterality: N/A;   ESOPHAGOGASTRODUODENOSCOPY (EGD) WITH PROPOFOL  N/A  02/15/2023   Procedure: ESOPHAGOGASTRODUODENOSCOPY (EGD) WITH PROPOFOL ;  Surgeon: Eartha Angelia Sieving, MD;  [facility];  Service: Gastroenterology;  Laterality: N/A;   ESOPHAGOGASTRODUODENOSCOPY (EGD) WITH PROPOFOL  N/A 08/01/2023    Procedure: ESOPHAGOGASTRODUODENOSCOPY (EGD) WITH PROPOFOL ;  Surgeon: Cinderella Deatrice FALCON, MD;  [facility];  Service: Endoscopy;  Laterality: N/A;  8:45AM;ASA 3, pt knows to arrive at 6:15   EYE SURGERY Bilateral    partial cornea transplants    FLEXIBLE SIGMOIDOSCOPY  03/14/2023   Procedure: FLEXIBLE SIGMOIDOSCOPY;  Surgeon: Cinderella Deatrice FALCON, MD;  [facility];  Service: Endoscopy;;   GALLBLADDER SURGERY  12/1993   HOT HEMOSTASIS  02/15/2023   Procedure: HOT HEMOSTASIS (ARGON PLASMA COAGULATION/BICAP);  Surgeon: Eartha Angelia, Sieving, MD;  [facility];  Service: Gastroenterology;;   PARTIAL COLECTOMY N/A 06/21/2023   Procedure: PARTIAL COLECTOMY,  repair colovesicle fistula;  Surgeon: Mavis Anes, MD;  Location: [facility];  Service: General;  Laterality: N/A;   SCLEROTHERAPY  02/15/2023   Procedure: SCLEROTHERAPY;  Surgeon: Eartha Angelia, Sieving, MD;  [facility];  Service: Gastroenterology;;   UPPER GASTROINTESTINAL ENDOSCOPY     Social History   Social History Narrative   Not on file   Immunization History  Administered Date(s) Administered   Influenza-Unspecified 04/16/2020   PFIZER(Purple Top)SARS-COV-2 Vaccination 08/05/2019, 08/26/2019   Zoster Recombinant(Shingrix) 01/03/2018, 06/12/2018     Objective: Vital Signs: There were no vitals taken for this visit.   Physical Exam Vitals and nursing note reviewed.  Constitutional:      Appearance: He is well-developed.  HENT:     Head: Normocephalic and atraumatic.  Eyes:     Conjunctiva/sclera: Conjunctivae normal.     Pupils: Pupils are equal, round, and reactive to light.  Cardiovascular:     Rate and Rhythm: Normal rate and regular rhythm.     Heart sounds: Normal heart sounds.  Pulmonary:     Effort: Pulmonary effort is normal.     Breath sounds: Normal breath sounds.  Abdominal:     General: Bowel sounds are normal.     Palpations: Abdomen is soft.  Musculoskeletal:      Cervical back: Normal range of motion and neck supple.  Skin:    General: Skin is warm and dry.     Capillary Refill: Capillary refill takes less than 2 seconds.  Neurological:     Mental Status: He is alert and oriented to person, place, and time.  Psychiatric:        Behavior: Behavior normal.      Musculoskeletal Exam: ***  CDAI Exam: CDAI Score: -- Patient Global: --; Provider Global: -- Swollen: --; Tender: -- Joint Exam 12/25/2023   No joint exam has been documented for this visit   There is currently no information documented on the homunculus. Go to the Rheumatology activity and complete the homunculus joint exam.  Investigation: No additional findings.  Imaging: No results found.  Recent Labs: Lab Results  Component Value Date   WBC 5.4 10/09/2023   HGB 12.7 (L) 10/09/2023   PLT 180 10/09/2023   NA 129 (L) 06/24/2023   K 4.3 06/24/2023   CL 97 (L) 06/24/2023   CO2 24 06/24/2023   GLUCOSE 170 (H) 06/24/2023   BUN 21 06/24/2023   CREATININE 1.09 06/24/2023   BILITOT 0.8 06/24/2023   ALKPHOS 94 06/24/2023   AST 20 06/24/2023   ALT 26 06/24/2023   PROT 7.0 06/24/2023   ALBUMIN 3.4 (L) 06/24/2023  CALCIUM 9.0 06/24/2023   GFRAA 89 03/22/2020   QFTBGOLDPLUS NEGATIVE 08/21/2018    Speciality Comments: Plaquenil  April 2020-discontinued due to rash Methotrexate  is started Nov 06, 2018  Procedures:  No procedures performed Allergies: Bee pollen, Plaquenil  [hydroxychloroquine  sulfate], and Lodine [etodolac]   Assessment / Plan:     Visit Diagnoses: Autoimmune disease (HCC)  High risk medication use  Anti-cardiolipin antibody positive  [name] disease without gangrene  History of iron  deficiency anemia  Primary osteoarthritis of both hands  Fibromyalgia  Trochanteric bursitis of both hips  History of diverticulitis  Fuchs' corneal dystrophy, unspecified laterality  Essential hypertension  History of sleep  apnea  Hypercholesteremia  History of multiple allergies  Orders: No orders of the defined types were placed in this encounter.  No orders of the defined types were placed in this encounter.   Face-to-face time spent with patient was *** minutes. Greater than 50% of time was spent in counseling and coordination of care.  Follow-Up Instructions: No follow-ups on file.   Waddell CHRISTELLA Craze, PA-C  Note - This record has been created using Dragon software.  Chart creation errors have been sought, but may not always  have been located. Such creation errors do not reflect on  the standard of medical care.

## 2023-12-17 NOTE — Telephone Encounter (Signed)
 Pt is cancelling procedure on 12/26/23. They are out of town and he wants to be rescheduled in August. Will call once we get providers schedule.

## 2023-12-19 ENCOUNTER — Other Ambulatory Visit: Payer: Self-pay

## 2023-12-19 DIAGNOSIS — Z79899 Other long term (current) drug therapy: Secondary | ICD-10-CM

## 2023-12-19 DIAGNOSIS — R760 Raised antibody titer: Secondary | ICD-10-CM

## 2023-12-19 DIAGNOSIS — M359 Systemic involvement of connective tissue, unspecified: Secondary | ICD-10-CM

## 2023-12-19 DIAGNOSIS — I7300 Raynaud's syndrome without gangrene: Secondary | ICD-10-CM

## 2023-12-23 ENCOUNTER — Other Ambulatory Visit: Payer: Self-pay | Admitting: Cardiology

## 2023-12-23 DIAGNOSIS — R760 Raised antibody titer: Secondary | ICD-10-CM | POA: Diagnosis not present

## 2023-12-23 DIAGNOSIS — H353211 Exudative age-related macular degeneration, right eye, with active choroidal neovascularization: Secondary | ICD-10-CM | POA: Diagnosis not present

## 2023-12-23 DIAGNOSIS — I7300 Raynaud's syndrome without gangrene: Secondary | ICD-10-CM | POA: Diagnosis not present

## 2023-12-23 DIAGNOSIS — M359 Systemic involvement of connective tissue, unspecified: Secondary | ICD-10-CM | POA: Diagnosis not present

## 2023-12-23 DIAGNOSIS — Z79899 Other long term (current) drug therapy: Secondary | ICD-10-CM | POA: Diagnosis not present

## 2023-12-24 ENCOUNTER — Ambulatory Visit: Payer: Self-pay | Admitting: Physician Assistant

## 2023-12-24 ENCOUNTER — Encounter (HOSPITAL_COMMUNITY)

## 2023-12-24 NOTE — Progress Notes (Signed)
 CBC stable.  Glucose 111.  Sodium remains low-129-stable. Please forward results to PCP.    Complements WNL. ESR within normal limits.

## 2023-12-25 ENCOUNTER — Encounter: Payer: Self-pay | Admitting: Physician Assistant

## 2023-12-25 ENCOUNTER — Ambulatory Visit: Attending: Physician Assistant | Admitting: Physician Assistant

## 2023-12-25 ENCOUNTER — Other Ambulatory Visit: Payer: Self-pay

## 2023-12-25 VITALS — BP 131/76 | HR 80 | Resp 14 | Ht 71.0 in | Wt 173.0 lb

## 2023-12-25 DIAGNOSIS — M359 Systemic involvement of connective tissue, unspecified: Secondary | ICD-10-CM

## 2023-12-25 DIAGNOSIS — R760 Raised antibody titer: Secondary | ICD-10-CM

## 2023-12-25 DIAGNOSIS — I7300 Raynaud's syndrome without gangrene: Secondary | ICD-10-CM | POA: Diagnosis not present

## 2023-12-25 DIAGNOSIS — M19041 Primary osteoarthritis, right hand: Secondary | ICD-10-CM | POA: Diagnosis not present

## 2023-12-25 DIAGNOSIS — Z889 Allergy status to unspecified drugs, medicaments and biological substances status: Secondary | ICD-10-CM

## 2023-12-25 DIAGNOSIS — M19042 Primary osteoarthritis, left hand: Secondary | ICD-10-CM

## 2023-12-25 DIAGNOSIS — M797 Fibromyalgia: Secondary | ICD-10-CM | POA: Diagnosis not present

## 2023-12-25 DIAGNOSIS — Z8719 Personal history of other diseases of the digestive system: Secondary | ICD-10-CM | POA: Diagnosis not present

## 2023-12-25 DIAGNOSIS — M7062 Trochanteric bursitis, left hip: Secondary | ICD-10-CM

## 2023-12-25 DIAGNOSIS — Z862 Personal history of diseases of the blood and blood-forming organs and certain disorders involving the immune mechanism: Secondary | ICD-10-CM | POA: Diagnosis not present

## 2023-12-25 DIAGNOSIS — Z79899 Other long term (current) drug therapy: Secondary | ICD-10-CM

## 2023-12-25 DIAGNOSIS — M7061 Trochanteric bursitis, right hip: Secondary | ICD-10-CM

## 2023-12-25 DIAGNOSIS — Z8669 Personal history of other diseases of the nervous system and sense organs: Secondary | ICD-10-CM

## 2023-12-25 DIAGNOSIS — I10 Essential (primary) hypertension: Secondary | ICD-10-CM

## 2023-12-25 DIAGNOSIS — E7800 Pure hypercholesterolemia, unspecified: Secondary | ICD-10-CM

## 2023-12-25 DIAGNOSIS — H18519 Endothelial corneal dystrophy, unspecified eye: Secondary | ICD-10-CM

## 2023-12-25 LAB — CMP14+EGFR
ALT: 24 IU/L (ref 0–44)
AST: 17 IU/L (ref 0–40)
Albumin: 4.4 g/dL (ref 3.8–4.8)
Alkaline Phosphatase: 121 IU/L (ref 44–121)
BUN/Creatinine Ratio: 20 (ref 10–24)
BUN: 17 mg/dL (ref 8–27)
Bilirubin Total: 0.5 mg/dL (ref 0.0–1.2)
CO2: 21 mmol/L (ref 20–29)
Calcium: 9.5 mg/dL (ref 8.6–10.2)
Chloride: 94 mmol/L — ABNORMAL LOW (ref 96–106)
Creatinine, Ser: 0.87 mg/dL (ref 0.76–1.27)
Globulin, Total: 2.5 g/dL (ref 1.5–4.5)
Glucose: 111 mg/dL — ABNORMAL HIGH (ref 70–99)
Potassium: 4.6 mmol/L (ref 3.5–5.2)
Sodium: 129 mmol/L — ABNORMAL LOW (ref 134–144)
Total Protein: 6.9 g/dL (ref 6.0–8.5)
eGFR: 87 mL/min/1.73 (ref >59)

## 2023-12-25 LAB — C3 AND C4
Complement C3, Serum: 113 mg/dL (ref 82–167)
Complement C4, Serum: 17 mg/dL (ref 12–38)

## 2023-12-25 LAB — CARDIOLIPIN ANTIBODIES, IGG, IGM, IGA
Anticardiolipin IgA: <9 U/mL (ref 0–11)
Anticardiolipin IgG: <9 GPL U/mL (ref 0–14)
Anticardiolipin IgM: 52 [MPL'U]/mL — AB (ref 0–12)

## 2023-12-25 LAB — CBC WITH DIFFERENTIAL/PLATELET
Basophils Absolute: 0 x10E3/uL (ref 0.0–0.2)
Basos: 1 %
EOS (ABSOLUTE): 0.1 x10E3/uL (ref 0.0–0.4)
Eos: 1 %
Hematocrit: 43.2 % (ref 37.5–51.0)
Hemoglobin: 14.6 g/dL (ref 13.0–17.7)
Immature Grans (Abs): 0 x10E3/uL (ref 0.0–0.1)
Immature Granulocytes: 0 %
Lymphocytes Absolute: 0.8 x10E3/uL (ref 0.7–3.1)
Lymphs: 19 %
MCH: 33 pg (ref 26.6–33.0)
MCHC: 33.8 g/dL (ref 31.5–35.7)
MCV: 98 fL — ABNORMAL HIGH (ref 79–97)
Monocytes Absolute: 0.3 x10E3/uL (ref 0.1–0.9)
Monocytes: 6 %
Neutrophils Absolute: 3.1 x10E3/uL (ref 1.4–7.0)
Neutrophils: 73 %
Platelets: 217 x10E3/uL (ref 150–450)
RBC: 4.43 x10E6/uL (ref 4.14–5.80)
RDW: 13.2 % (ref 11.6–15.4)
WBC: 4.3 x10E3/uL (ref 3.4–10.8)

## 2023-12-25 LAB — PROTEIN / CREATININE RATIO, URINE
Creatinine, Urine: 76.7 mg/dL
Protein, Ur: 11.6 mg/dL
Protein/Creat Ratio: 151 mg/g{creat} (ref 0–200)

## 2023-12-25 LAB — BETA-2-GLYCOPROTEIN I ABS, IGG/M/A
Beta-2 Glyco 1 IgA: 10 GPI IgA units (ref 0–25)
Beta-2 Glyco 1 IgM: >150 GPI IgM units — AB (ref 0–32)
Beta-2 Glyco I IgG: 18 GPI IgG units (ref 0–20)

## 2023-12-25 LAB — SEDIMENTATION RATE: Sed Rate: 2 mm/h (ref 0–30)

## 2023-12-25 LAB — ANTI-DNA ANTIBODY, DOUBLE-STRANDED: dsDNA Ab: 1 [IU]/mL (ref 0–9)

## 2023-12-25 LAB — ANA

## 2023-12-25 NOTE — Progress Notes (Signed)
Future orders pended.

## 2023-12-25 NOTE — Progress Notes (Signed)
 Results will be discussed in detail at appt today

## 2023-12-25 NOTE — Patient Instructions (Signed)
 Duloxetine Delayed-Release Capsules What is this medication? DULOXETINE (doo LOX e teen) treats depression, anxiety, fibromyalgia, and certain types of chronic pain such as nerve, bone, or joint pain. It increases the amount of serotonin and norepinephrine in the brain, hormones that help regulate mood and pain. It belongs to a group of medications called SNRIs. This medicine may be used for other purposes; ask your health care provider or pharmacist if you have questions. COMMON BRAND NAME(S): Cymbalta, Drizalma, Irenka What should I tell my care team before I take this medication? They need to know if you have any of these conditions: Bipolar disorder Glaucoma High blood pressure Kidney disease Liver disease Seizures Suicidal thoughts, plans, or attempt by you or a family member Take medications that treat or prevent blood clots Taken an MAOI, such as Carbex, Eldepryl, Marplan, Nardil, or Parnate in the last 14 days Trouble passing urine An unusual reaction to duloxetine, other medications, foods, dyes, or preservatives Pregnant or trying to get pregnant Breastfeeding How should I use this medication? Take this medication by mouth with water. Take it as directed on the prescription label at the same time every day. Do not cut, crush, or chew this medication. Swallow the capsules whole. Some capsules may be opened and sprinkled on applesauce. Check with your care team or pharmacist if you are not sure. You can take this medication with or without food. Do not take your medication more often than directed. Do not stop taking this medication suddenly except upon the advice of your care team. Stopping this medication too quickly may cause serious side effects or your condition may worsen. A special MedGuide will be given to you by the pharmacist with each prescription and refill. Be sure to read this information carefully each time. Talk to your care team about the use of this medication in  children. While it may be prescribed for children as young as 7 years for selected conditions, precautions do apply. Overdosage: If you think you have taken too much of this medicine contact a poison control center or emergency room at once. NOTE: This medicine is only for you. Do not share this medicine with others. What if I miss a dose? If you miss a dose, take it as soon as you can. If it is almost time for your next dose, take only that dose. Do not take double or extra doses. What may interact with this medication? Do not take this medication with any of the following: Desvenlafaxine Levomilnacipran Linezolid MAOIs, such as Carbex, Eldepryl, Emsam, Marplan, Nardil, and Parnate Methylene blue (injected into a vein) Milnacipran Safinamide Thioridazine Venlafaxine Viloxazine This medication may also interact with the following: Alcohol Amphetamines Aspirin and aspirin-like medications Certain antibiotics, such as ciprofloxacin and enoxacin Certain medications for blood pressure, heart disease, irregular heart beat Certain medications for mental health conditions Certain medications for migraine headache, such as almotriptan, eletriptan, frovatriptan, naratriptan, rizatriptan, sumatriptan, zolmitriptan Certain medications that treat or prevent blood clots, such as warfarin, enoxaparin, and dalteparin Cimetidine Fentanyl Lithium NSAIDS, medications for pain and inflammation, such as ibuprofen or naproxen Phentermine Procarbazine Rasagiline Sibutramine St. John's Wort Theophylline Tramadol Tryptophan This list may not describe all possible interactions. Give your health care provider a list of all the medicines, herbs, non-prescription drugs, or dietary supplements you use. Also tell them if you smoke, drink alcohol, or use illegal drugs. Some items may interact with your medicine. What should I watch for while using this medication? Tell your care team if your  symptoms do not  get better or if they get worse. Visit your care team for regular checks on your progress. Because it may take several weeks to see the full effects of this medication, it is important to continue your treatment as prescribed by your care team. This medication may cause serious skin reactions. They can happen weeks to months after starting the medication. Contact your care team right away if you notice fevers or flu-like symptoms with a rash. The rash may be red or purple and then turn into blisters or peeling of the skin. You may also notice a red rash with swelling of the face, lips, or lymph nodes in your neck or under your arms. Watch for new or worsening thoughts of suicide or depression. This includes sudden changes in mood, behaviors, or thoughts. These changes can happen at any time but are more common in the beginning of treatment or after a change in dose. Call your care team right away if you experience these thoughts or worsening depression. This medication may cause mood and behavior changes, such as anxiety, nervousness, irritability, hostility, restlessness, excitability, hyperactivity, or trouble sleeping. These changes can happen at any time but are more common in the beginning of treatment or after a change in dose. Call your care team right away if you notice any of these symptoms. This medication may affect your coordination, reaction time, or judgment. Do not drive or operate machinery until you know how this medication affects you. Sit up or stand slowly to reduce the risk of dizzy or fainting spells. Drinking alcohol with this medication can increase the risk of these side effects. This medication may increase blood sugar. The risk may be higher in patients who already have diabetes. Ask your care team what you can do to lower your risk of diabetes while taking this medication. This medication can cause an increase in blood pressure. This medication can also cause a sudden drop in your  blood pressure, which may make you feel faint and increase the chance of a fall. These effects are most common when you first start the medication or when the dose is increased, or during use of other medications that can cause a sudden drop in blood pressure. Check with your care team for instructions on monitoring your blood pressure while taking this medication. Your mouth may get dry. Chewing sugarless gum or sucking hard candy and drinking plenty of water may help. Contact your care team if the problem does not go away or is severe. What side effects may I notice from receiving this medication? Side effects that you should report to your care team as soon as possible: Allergic reactions--skin rash, itching, hives, swelling of the face, lips, tongue, or throat Bleeding--bloody or black, tar-like stools, red or dark brown urine, vomiting blood or brown material that looks like coffee grounds, small, red or purple spots on skin, unusual bleeding or bruising Increase in blood pressure Liver injury--right upper belly pain, loss of appetite, nausea, light-colored stool, dark yellow or brown urine, yellowing skin or eyes, unusual weakness or fatigue Low sodium level--muscle weakness, fatigue, dizziness, headache, confusion Redness, blistering, peeling, or loosening of the skin, including inside the mouth Serotonin syndrome--irritability, confusion, fast or irregular heartbeat, muscle stiffness, twitching muscles, sweating, high fever, seizures, chills, vomiting, diarrhea Sudden eye pain or change in vision such as blurry vision, seeing halos around lights, vision loss Thoughts of suicide or self-harm, worsening mood, feelings of depression Trouble passing urine Side effects that  usually do not require medical attention (report to your care team if they continue or are bothersome): Change in sex drive or performance Constipation Diarrhea Dizziness Dry mouth Excessive sweating Loss of  appetite Nausea Vomiting This list may not describe all possible side effects. Call your doctor for medical advice about side effects. You may report side effects to FDA at 1-800-FDA-1088. Where should I keep my medication? Keep out of the reach of children and pets. Store at room temperature between 15 and 30 degrees C (59 to 86 degrees F). Get rid of any unused medication after the expiration date. To get rid of medications that are no longer needed or have expired: Take the medication to a medication take-back program. Check with your pharmacy or law enforcement to find a location. If you cannot return the medication, check the label or package insert to see if the medication should be thrown out in the garbage or flushed down the toilet. If you are not sure, ask your care team. If it is safe to put it in the trash, take the medication out of the container. Mix the medication with cat litter, dirt, coffee grounds, or other unwanted substance. Seal the mixture in a bag or container. Put it in the trash. NOTE: This sheet is a summary. It may not cover all possible information. If you have questions about this medicine, talk to your doctor, pharmacist, or health care provider.  2024 Elsevier/Gold Standard (2022-03-08 00:00:00)

## 2023-12-26 ENCOUNTER — Encounter (HOSPITAL_COMMUNITY): Payer: Self-pay

## 2023-12-26 ENCOUNTER — Ambulatory Visit (HOSPITAL_COMMUNITY): Admit: 2023-12-26 | Admitting: Gastroenterology

## 2023-12-26 SURGERY — COLONOSCOPY
Anesthesia: Choice

## 2023-12-26 NOTE — Telephone Encounter (Signed)
 Called pt to reschedule procedure. He stated he does not feel he needs this and did not want to schedule procedure at this time. If he changes his mind, he will call and let us  know  FYI

## 2023-12-31 DIAGNOSIS — F419 Anxiety disorder, unspecified: Secondary | ICD-10-CM | POA: Diagnosis not present

## 2023-12-31 DIAGNOSIS — Z6824 Body mass index (BMI) 24.0-24.9, adult: Secondary | ICD-10-CM | POA: Diagnosis not present

## 2023-12-31 NOTE — Telephone Encounter (Signed)
 noted

## 2024-01-25 ENCOUNTER — Emergency Department (HOSPITAL_COMMUNITY)
Admission: EM | Admit: 2024-01-25 | Discharge: 2024-01-25 | Disposition: A | Discharge status: Home / Self Care | Attending: Emergency Medicine | Admitting: Emergency Medicine

## 2024-01-25 ENCOUNTER — Emergency Department (HOSPITAL_COMMUNITY)

## 2024-01-25 ENCOUNTER — Other Ambulatory Visit: Payer: Self-pay

## 2024-01-25 DIAGNOSIS — M797 Fibromyalgia: Secondary | ICD-10-CM | POA: Insufficient documentation

## 2024-01-25 DIAGNOSIS — G319 Degenerative disease of nervous system, unspecified: Secondary | ICD-10-CM | POA: Insufficient documentation

## 2024-01-25 DIAGNOSIS — E871 Hypo-osmolality and hyponatremia: Secondary | ICD-10-CM | POA: Diagnosis not present

## 2024-01-25 DIAGNOSIS — I10 Essential (primary) hypertension: Secondary | ICD-10-CM | POA: Diagnosis not present

## 2024-01-25 DIAGNOSIS — R519 Headache, unspecified: Secondary | ICD-10-CM | POA: Insufficient documentation

## 2024-01-25 DIAGNOSIS — I6782 Cerebral ischemia: Secondary | ICD-10-CM | POA: Diagnosis not present

## 2024-01-25 DIAGNOSIS — R739 Hyperglycemia, unspecified: Secondary | ICD-10-CM | POA: Insufficient documentation

## 2024-01-25 DIAGNOSIS — Z79899 Other long term (current) drug therapy: Secondary | ICD-10-CM | POA: Insufficient documentation

## 2024-01-25 DIAGNOSIS — R791 Abnormal coagulation profile: Secondary | ICD-10-CM | POA: Insufficient documentation

## 2024-01-25 DIAGNOSIS — I672 Cerebral atherosclerosis: Secondary | ICD-10-CM | POA: Diagnosis not present

## 2024-01-25 LAB — CBC WITH DIFFERENTIAL/PLATELET
Abs Immature Granulocytes: 0.03 K/uL (ref 0.00–0.07)
Basophils Absolute: 0 K/uL (ref 0.0–0.1)
Basophils Relative: 1 %
Eosinophils Absolute: 0 K/uL (ref 0.0–0.5)
Eosinophils Relative: 0 %
HCT: 39.9 % (ref 39.0–52.0)
Hemoglobin: 14.3 g/dL (ref 13.0–17.0)
Immature Granulocytes: 0 %
Lymphocytes Relative: 19 %
Lymphs Abs: 1.4 K/uL (ref 0.7–4.0)
MCH: 32.9 pg (ref 26.0–34.0)
MCHC: 35.8 g/dL (ref 30.0–36.0)
MCV: 91.9 fL (ref 80.0–100.0)
Monocytes Absolute: 0.6 K/uL (ref 0.1–1.0)
Monocytes Relative: 8 %
Neutro Abs: 5.3 K/uL (ref 1.7–7.7)
Neutrophils Relative %: 72 %
Platelets: 253 K/uL (ref 150–400)
RBC: 4.34 MIL/uL (ref 4.22–5.81)
RDW: 13.6 % (ref 11.5–15.5)
WBC: 7.4 K/uL (ref 4.0–10.5)
nRBC: 0 % (ref 0.0–0.2)

## 2024-01-25 LAB — URINALYSIS, ROUTINE W REFLEX MICROSCOPIC
Bilirubin Urine: NEGATIVE
Glucose, UA: NEGATIVE mg/dL
Hgb urine dipstick: NEGATIVE
Ketones, ur: NEGATIVE mg/dL
Leukocytes,Ua: NEGATIVE
Nitrite: NEGATIVE
Protein, ur: NEGATIVE mg/dL
Specific Gravity, Urine: 1.016 (ref 1.005–1.030)
pH: 6 (ref 5.0–8.0)

## 2024-01-25 LAB — SALICYLATE LEVEL: Salicylate Lvl: <7 mg/dL — ABNORMAL LOW (ref 7.0–30.0)

## 2024-01-25 LAB — COMPREHENSIVE METABOLIC PANEL WITH GFR
ALT: 40 U/L (ref 0–44)
AST: 32 U/L (ref 15–41)
Albumin: 4 g/dL (ref 3.5–5.0)
Alkaline Phosphatase: 101 U/L (ref 38–126)
Anion gap: 14 (ref 5–15)
BUN: 21 mg/dL (ref 8–23)
CO2: 19 mmol/L — ABNORMAL LOW (ref 22–32)
Calcium: 9.1 mg/dL (ref 8.9–10.3)
Chloride: 92 mmol/L — ABNORMAL LOW (ref 98–111)
Creatinine, Ser: 0.69 mg/dL (ref 0.61–1.24)
GFR, Estimated: >60 mL/min (ref >60)
Glucose, Bld: 119 mg/dL — ABNORMAL HIGH (ref 70–99)
Potassium: 3.9 mmol/L (ref 3.5–5.1)
Sodium: 125 mmol/L — ABNORMAL LOW (ref 135–145)
Total Bilirubin: 0.9 mg/dL (ref 0.0–1.2)
Total Protein: 7.2 g/dL (ref 6.5–8.1)

## 2024-01-25 LAB — PROTIME-INR
INR: 1.1 (ref 0.8–1.2)
Prothrombin Time: 14.9 s (ref 11.4–15.2)

## 2024-01-25 LAB — RESP PANEL BY RT-PCR (RSV, FLU A&B, COVID)  RVPGX2
Influenza A by PCR: NEGATIVE
Influenza B by PCR: NEGATIVE
Resp Syncytial Virus by PCR: NEGATIVE
SARS Coronavirus 2 by RT PCR: NEGATIVE

## 2024-01-25 LAB — LIPASE, BLOOD: Lipase: 36 U/L (ref 11–51)

## 2024-01-25 LAB — TROPONIN I (HIGH SENSITIVITY)
Troponin I (High Sensitivity): 6 ng/L (ref <18)
Troponin I (High Sensitivity): 6 ng/L (ref <18)

## 2024-01-25 LAB — APTT: aPTT: 42 s — ABNORMAL HIGH (ref 24–36)

## 2024-01-25 LAB — ACETAMINOPHEN LEVEL: Acetaminophen (Tylenol), Serum: 11 ug/mL (ref 10–30)

## 2024-01-25 MED ORDER — DIPHENHYDRAMINE HCL 25 MG PO CAPS
25.0000 mg | ORAL_CAPSULE | Freq: Once | ORAL | Status: AC
  Administered 2024-01-25: 25 mg via ORAL
  Filled 2024-01-25: qty 1

## 2024-01-25 MED ORDER — DULOXETINE HCL 30 MG PO CPEP: 30.0000 mg | ORAL_CAPSULE | Freq: Every day | ORAL | 0 refills | Status: DC

## 2024-01-25 MED ORDER — SODIUM CHLORIDE 0.9 % IV BOLUS
1000.0000 mL | Freq: Once | INTRAVENOUS | Status: AC
  Administered 2024-01-25: 1000 mL via INTRAVENOUS

## 2024-01-25 MED ORDER — METOCLOPRAMIDE HCL 5 MG/ML IJ SOLN
10.0000 mg | Freq: Once | INTRAMUSCULAR | Status: AC
  Administered 2024-01-25: 10 mg via INTRAVENOUS
  Filled 2024-01-25: qty 2

## 2024-01-25 MED ORDER — MAGNESIUM SULFATE IN D5W 1-5 GM/100ML-% IV SOLN
1.0000 g | Freq: Once | INTRAVENOUS | Status: AC
  Administered 2024-01-25: 1 g via INTRAVENOUS
  Filled 2024-01-25: qty 100

## 2024-01-25 NOTE — ED Notes (Signed)
 Tested positive for covid on 01/14/2024 and 01/23/2024

## 2024-01-25 NOTE — ED Provider Notes (Signed)
 [place] EMERGENCY DEPARTMENT AT [facility] Provider Note   CSN: [id number] Arrival date & time: 01/25/24  1549     Patient presents with: Headache   Cole Brown is a 78 y.o. male medical history significant for fibromyalgia, anemia and hypertension presents today for body aches and headache times the last several days.  Patient also reports fatigue photophobia and tinnitus.  Patient denies shortness of breath chest pain, double vision, abdominal pain, nausea, vomiting, phonophobia, neck stiffness, fever, chills, or urinary symptoms.    Headache Associated symptoms: photophobia        Prior to Admission medications   Medication Sig Start Date End Date Taking? Authorizing Provider  DULoxetine  (CYMBALTA ) 30 MG capsule Take 1 capsule (30 mg total) by mouth daily. 02/09/24  Yes Jaran Sainz N, PA-C  acetaminophen  (TYLENOL ) 500 MG tablet Take 1,000 mg by mouth every 6 (six) hours as needed (pain.).    [provider]  amLODipine  (NORVASC ) 5 MG tablet Take 1 tablet (5 mg total) by mouth daily. 08/28/22   Miriam Norris, NP  atenolol  (TENORMIN ) 25 MG tablet TAKE (1/2) TABLET BY MOUTH ONCE DAILY. 12/25/23   Alvan Dorn FALCON, MD  azelastine  (ASTELIN ) 0.1 % nasal spray Place 1 spray into both nostrils 2 (two) times daily as needed for allergies. 09/17/19   [provider]  Bevacizumab  (AVASTIN  IV) Place into the right eye.    [provider]  Cholecalciferol  (VITAMIN D ) 50 MCG (2000 UT) CAPS Take 2,000 Units by mouth daily.    [provider]  cyanocobalamin  (VITAMIN B12) 1000 MCG tablet Take 1,000 mcg by mouth daily.    [provider]  diphenhydrAMINE  (BENADRYL ) 25 MG tablet Take 25 mg by mouth daily as needed for allergies.    [provider]  diphenhydrAMINE -zinc acetate (BENADRYL ) cream Apply topically daily.    [provider]  EPINEPHrine  0.3 mg/0.3 mL IJ SOAJ injection Inject 0.3 mg into the muscle as needed  for anaphylaxis. 10/14/19   [provider]  faricimab -svoa (VABYSMO ) 6 MG/0.05ML SOSY intravitreal injection 6 mg by Intravitreal route every 30 (thirty) days.    [provider]  fluticasone  (FLONASE ) 50 MCG/ACT nasal spray Place 1-2 sprays into both nostrils daily as needed for allergies. 07/27/20   [provider]  folic acid  (FOLVITE ) 1 MG tablet TAKE 2 TABLETS BY MOUTH DAILY. 11/18/23   Cheryl Waddell HERO, PA-C  hydrocortisone  1 % ointment Apply 1 Application topically daily.    [provider]  Investigational - Study Medication Take 40 mg by mouth in the morning. Atorvastatin study med. PREVENTABLE trial    [provider]  levocetirizine (XYZAL ) 5 MG tablet Take 5 mg by mouth every evening.     [provider]  montelukast  (SINGULAIR ) 10 MG tablet Take 10 mg by mouth at bedtime.    [provider]  Multiple Vitamins-Minerals (ICAPS AREDS 2 PO) Take 1 capsule by mouth in the morning.    [provider]  Omega-3 Fatty Acids (FISH OIL) 1000 MG CAPS Take 1,000 mg by mouth in the morning.    [provider]  pantoprazole  (PROTONIX ) 40 MG tablet Take 1 tablet (40 mg total) by mouth daily. 06/13/23 06/12/24  Cinderella Deatrice FALCON, MD  TESTOSTERONE  COMPOUNDING KIT TD Apply 4 Pump topically every evening. 10% compounded gel from [facility] (4 clicks)    [provider]  triazolam  (HALCION ) 0.25 MG tablet Take 0.25 mg by mouth at bedtime.  [provider]    Allergies: Bee pollen, Plaquenil  [hydroxychloroquine  sulfate], and Lodine [etodolac]    Review of Systems  HENT:  Positive for tinnitus.   Eyes:  Positive for photophobia.  Musculoskeletal:  Positive for arthralgias.  Neurological:  Positive for headaches.    Updated Vital Signs BP (!) 156/87   Pulse 68   Temp (!) 97.5 F (36.4 C) (Oral)   Resp 11   Ht 5' 11 (1.803 m)   Wt 77.1 kg   SpO2 100%   BMI 23.71 kg/m   Physical  Exam Vitals and nursing note reviewed.  Constitutional:      General: He is not in acute distress.    Appearance: He is well-developed. He is not ill-appearing.  HENT:     Head: Normocephalic and atraumatic.  Eyes:     General: No visual field deficit.    Extraocular Movements: Extraocular movements intact.     Right eye: Normal extraocular motion and no nystagmus.     Left eye: Normal extraocular motion and no nystagmus.     Conjunctiva/sclera: Conjunctivae normal.     Pupils: Pupils are equal, round, and reactive to light.  Neck:     Meningeal: Brudzinski's sign absent.  Cardiovascular:     Rate and Rhythm: Normal rate and regular rhythm.     Heart sounds: Normal heart sounds. No murmur heard. Pulmonary:     Effort: Pulmonary effort is normal. No respiratory distress.     Breath sounds: Normal breath sounds.  Abdominal:     Palpations: Abdomen is soft.     Tenderness: There is no abdominal tenderness.  Musculoskeletal:        General: No swelling.     Cervical back: Neck supple. No rigidity.  Lymphadenopathy:     Cervical: No cervical adenopathy.  Skin:    General: Skin is warm and dry.     Capillary Refill: Capillary refill takes less than 2 seconds.  Neurological:     Mental Status: He is alert and oriented to person, place, and time.     GCS: GCS eye subscore is 4. GCS verbal subscore is 5. GCS motor subscore is 6.     Cranial Nerves: No cranial nerve deficit, dysarthria or facial asymmetry.     Sensory: No sensory deficit.     Motor: No weakness.  Psychiatric:        Mood and Affect: Mood normal.     (all labs ordered are listed, but only abnormal results are displayed) Labs Reviewed  COMPREHENSIVE METABOLIC PANEL WITH GFR - Abnormal; Notable for the following components:      Result Value   Sodium 125 (*)    Chloride 92 (*)    CO2 19 (*)    Glucose, Bld 119 (*)    All other components within normal limits  SALICYLATE LEVEL - Abnormal; Notable for the  following components:   Salicylate Lvl <7.0 (*)    All other components within normal limits  APTT - Abnormal; Notable for the following components:   aPTT 42 (*)    All other components within normal limits  RESP PANEL BY RT-PCR (RSV, FLU A&B, COVID)  RVPGX2  LIPASE, BLOOD  CBC WITH DIFFERENTIAL/PLATELET  ACETAMINOPHEN  LEVEL  URINALYSIS, ROUTINE W REFLEX MICROSCOPIC  PROTIME-INR  TROPONIN I (HIGH SENSITIVITY)  TROPONIN I (HIGH SENSITIVITY)    EKG: EKG Interpretation Date/Time:  Saturday January 25 2024 16:23:45 EDT Ventricular Rate:  63 PR Interval:  205 QRS Duration:  90  QT Interval:  406 QTC Calculation: 416 R Axis:   87  Text Interpretation: Sinus rhythm Borderline right axis deviation Borderline low voltage, extremity leads ST elevation, consider inferior injury COPY Confirmed by Towana Sharper [phone]) on 01/25/2024 4:27:59 PM  Radiology: CT Head Wo Contrast Result Date: 01/25/2024 CLINICAL DATA:  Headache and body aches. Increasing frequency or severity EXAM: CT HEAD WITHOUT CONTRAST TECHNIQUE: Contiguous axial images were obtained from the base of the skull through the vertex without intravenous contrast. RADIATION DOSE REDUCTION: This exam was performed according to the departmental dose-optimization program which includes automated exposure control, adjustment of the mA and/or kV according to patient size and/or use of iterative reconstruction technique. COMPARISON:  CT head 02/12/2023 FINDINGS: Brain: No intracranial hemorrhage, mass effect, or evidence of acute infarct. No hydrocephalus. No extra-axial fluid collection. Age-commensurate cerebral atrophy and chronic small vessel ischemic disease. Vascular: No hyperdense vessel. Intracranial arterial calcification. Skull: No fracture or focal lesion. Sinuses/Orbits: No acute finding. Other: None. IMPRESSION: No acute intracranial abnormality. Electronically Signed   By: Norman Gatlin M.D.   On: 01/25/2024 17:18      Procedures   Medications Ordered in the ED  magnesium  sulfate IVPB 1 g 100 mL (1 g Intravenous New Bag/Given 01/25/24 1842)  sodium chloride  0.9 % bolus 1,000 mL (1,000 mLs Intravenous New Bag/Given 01/25/24 1838)  metoCLOPramide  (REGLAN ) injection 10 mg (10 mg Intravenous Given 01/25/24 1836)  diphenhydrAMINE  (BENADRYL ) capsule 25 mg (25 mg Oral Given 01/25/24 1833)                                    Medical Decision Making Amount and/or Complexity of Data Reviewed Labs: ordered. Radiology: ordered.  Risk Prescription drug management.   This patient presents to the ED for concern of HA, body aches, this involves an extensive number of treatment options, and is a complaint that carries with it a high risk of complications and morbidity.  The differential diagnosis includes HA, brain bleed, COVID flu, RSV, liver failure, Tylenol  toxicity   Lab Tests:  I Ordered, and personally interpreted labs.  The pertinent results include: CBC WNL, pro time and INR WNL, APTT 42, salicylate negative, respiratory panel negative, acetaminophen  11, hyponatremia 125 patient's baseline around 129, hypochloremia 92, reduced CO2 at 19, elevated glucose at 119, delta troponin 6 UA WNL   Imaging Studies ordered:  I ordered imaging studies including CT head Noncon I independently visualized and interpreted imaging which showed no acute intracranial abnormality I agree with the radiologist interpretation   Cardiac Monitoring: / EKG:  The patient was maintained on a cardiac monitor.  I personally viewed and interpreted the cardiac monitored which showed an underlying rhythm of: Sinus rhythm, ST elevation   Problem List / ED Course / Critical interventions / Medication management  I ordered medication including IV fluids and migraine cocktail Reevaluation of the patient after these medicines showed that the patient no acute intracranial abnormality I have reviewed the patients home medicines and have  made adjustments as needed  Test / Admission - Considered:  Consulted pharmacist, Dr. Dorn Poot on discontinuing Prozac  and when to begin taking Cymbalta .  He recommended decreasing the patient's dose by half for 1 week then discontinuing completely.  Patient should then have a washout period of 1 week before beginning Cymbalta . Considered for admission or further workup however patient's vital signs, physical exam, labs, and imaging of been reassuring.  Patient states that his headache has improved while in ED.  Patient advised to follow-up with his primary care for further evaluation and workup of headache and hyponatremia.  Discussed with patient how to taper off his Prozac  and begin taking Cymbalta .  Patient given return precautions.  I feel patient is safe for discharge at this time.     Final diagnoses:  Nonintractable headache, unspecified chronicity pattern, unspecified headache type  Hyponatremia    ED Discharge Orders          Ordered    DULoxetine  (CYMBALTA ) 30 MG capsule  Daily        01/25/24 1936               Francis Ileana SAILOR, PA-C 01/25/24 1938    Towana Ozell BROCKS, MD 01/26/24 [phone]

## 2024-01-25 NOTE — ED Triage Notes (Signed)
 Arrives POV with wife. States he has had body aches and a headache on going for 1 month progressively worsening. Mentions taking upwards of 15 500 mg tylenol  tablets per day for the pain. 8/10 head and upper back pain.

## 2024-01-25 NOTE — Discharge Instructions (Addendum)
 Today you were seen for a headache.  You may taper off your Prozac  by taking half your usual dose for 1 week and then discontinuing the medication completely.  Please wait 7 days after discontinuing her Prozac  to begin your Cymbalta .  Your labs showed that your sodium was low.  Please follow-up with your PCP to ensure that this resolves.  Please do not take more than recommended amount of Tylenol .  Thank you for letting us  treat you today. After reviewing your labs and imaging, I feel you are safe to go home. Please follow up with your PCP in the next several days and provide them with your records from this visit. Return to the Emergency Room if pain becomes severe or symptoms worsen.

## 2024-01-27 ENCOUNTER — Inpatient Hospital Stay: Attending: Hematology

## 2024-01-27 DIAGNOSIS — D500 Iron deficiency anemia secondary to blood loss (chronic): Secondary | ICD-10-CM | POA: Insufficient documentation

## 2024-01-27 DIAGNOSIS — M797 Fibromyalgia: Secondary | ICD-10-CM | POA: Insufficient documentation

## 2024-01-27 DIAGNOSIS — K922 Gastrointestinal hemorrhage, unspecified: Secondary | ICD-10-CM | POA: Insufficient documentation

## 2024-01-27 DIAGNOSIS — E538 Deficiency of other specified B group vitamins: Secondary | ICD-10-CM | POA: Insufficient documentation

## 2024-01-27 DIAGNOSIS — Z79899 Other long term (current) drug therapy: Secondary | ICD-10-CM | POA: Insufficient documentation

## 2024-01-27 LAB — CBC WITH DIFFERENTIAL/PLATELET
Abs Immature Granulocytes: 0.01 K/uL (ref 0.00–0.07)
Basophils Absolute: 0.1 K/uL (ref 0.0–0.1)
Basophils Relative: 1 %
Eosinophils Absolute: 0 K/uL (ref 0.0–0.5)
Eosinophils Relative: 1 %
HCT: 38.9 % — ABNORMAL LOW (ref 39.0–52.0)
Hemoglobin: 13.7 g/dL (ref 13.0–17.0)
Immature Granulocytes: 0 %
Lymphocytes Relative: 17 %
Lymphs Abs: 1 K/uL (ref 0.7–4.0)
MCH: 32.9 pg (ref 26.0–34.0)
MCHC: 35.2 g/dL (ref 30.0–36.0)
MCV: 93.3 fL (ref 80.0–100.0)
Monocytes Absolute: 0.5 K/uL (ref 0.1–1.0)
Monocytes Relative: 9 %
Neutro Abs: 4.2 K/uL (ref 1.7–7.7)
Neutrophils Relative %: 72 %
Platelets: 262 K/uL (ref 150–400)
RBC: 4.17 MIL/uL — ABNORMAL LOW (ref 4.22–5.81)
RDW: 14 % (ref 11.5–15.5)
WBC: 5.9 K/uL (ref 4.0–10.5)
nRBC: 0 % (ref 0.0–0.2)

## 2024-01-27 LAB — COMPREHENSIVE METABOLIC PANEL WITH GFR
ALT: 42 U/L (ref 0–44)
AST: 25 U/L (ref 15–41)
Albumin: 3.9 g/dL (ref 3.5–5.0)
Alkaline Phosphatase: 109 U/L (ref 38–126)
Anion gap: 11 (ref 5–15)
BUN: 15 mg/dL (ref 8–23)
CO2: 24 mmol/L (ref 22–32)
Calcium: 9 mg/dL (ref 8.9–10.3)
Chloride: 93 mmol/L — ABNORMAL LOW (ref 98–111)
Creatinine, Ser: 0.67 mg/dL (ref 0.61–1.24)
GFR, Estimated: >60 mL/min (ref >60)
Glucose, Bld: 95 mg/dL (ref 70–99)
Potassium: 4 mmol/L (ref 3.5–5.1)
Sodium: 128 mmol/L — ABNORMAL LOW (ref 135–145)
Total Bilirubin: 1 mg/dL (ref 0.0–1.2)
Total Protein: 7 g/dL (ref 6.5–8.1)

## 2024-01-27 LAB — VITAMIN B12: Vitamin B-12: 872 pg/mL (ref 180–914)

## 2024-01-27 LAB — FERRITIN: Ferritin: 587 ng/mL — ABNORMAL HIGH (ref 24–336)

## 2024-01-27 LAB — IRON AND TIBC
Iron: 107 ug/dL (ref 45–182)
Saturation Ratios: 29 % (ref 17.9–39.5)
TIBC: 367 ug/dL (ref 250–450)
UIBC: 260 ug/dL

## 2024-01-30 NOTE — Telephone Encounter (Signed)
 Message noted.

## 2024-02-02 LAB — METHYLMALONIC ACID, SERUM: Methylmalonic Acid, Quantitative: 173 nmol/L (ref 0–378)

## 2024-02-03 ENCOUNTER — Ambulatory Visit: Payer: Self-pay

## 2024-02-03 VITALS — BP 151/81 | HR 66 | Ht 71.0 in | Wt 174.0 lb

## 2024-02-03 DIAGNOSIS — M797 Fibromyalgia: Secondary | ICD-10-CM

## 2024-02-03 DIAGNOSIS — F320 Major depressive disorder, single episode, mild: Secondary | ICD-10-CM | POA: Diagnosis not present

## 2024-02-03 MED ORDER — DULOXETINE HCL 30 MG PO CPEP: 30.0000 mg | ORAL_CAPSULE | Freq: Every day | ORAL | 3 refills | Status: DC

## 2024-02-03 MED ORDER — GABAPENTIN 300 MG PO CAPS
300.0000 mg | ORAL_CAPSULE | Freq: Every day | ORAL | 3 refills | Status: DC
Start: 2024-02-03 — End: 2024-05-01

## 2024-02-03 NOTE — Progress Notes (Signed)
 New Patient Office Visit  Subjective    Patient ID: Cole Brown, male    DOB: 1946/05/15  Age: 78 y.o. MRN: [id number]  CC:  Chief Complaint  Patient presents with   Establish Care    Pt here to establish care    HPI Cole Brown presents to establish care Discussed the use of AI scribe software for clinical note transcription with the patient, who gave verbal consent to proceed.  History of Present Illness   Cole Brown is an 78 year old male with fibromyalgia who presents with chronic pain management issues. He is accompanied by his wife, Cole Brown.  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 - Decreased engagement in conversation  Urinary symptoms - Frequent urination  Medication use and adverse effects - Excessive acetaminophen  use, up to 4 grams daily, despite prior medical advice against it - Current medications: acetaminophen , amlodipine , atenolol , epinephrine , montelukast  (Singulair ), pantoprazole  (Protonix ) - Transitioning from fluoxetine  to duloxetine  (Cymbalta ), with duloxetine  to start on February 09, 2024 - Previous prescription for hydrocodone -acetaminophen  following dental procedure, used sparingly - No prior use of gabapentin  - History of methotrexate  use for fibromyalgia, discontinued prior to surgery  Headache and electrolyte abnormality - Emergency room visit on January 25, 2024, for severe headache - Hyponatremia identified during ER visit  Gastrointestinal bleeding and surgical history - History of bleeding ulcers in esophagus and stomach - Required iron  infusions  for anemia secondary to GI bleeding - 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   amLODipine  (NORVASC ) 5 MG tablet Take 1 tablet (5 mg total) by mouth daily.   atenolol  (TENORMIN ) 25 MG tablet TAKE (1/2) TABLET BY MOUTH ONCE DAILY.   azelastine  (ASTELIN ) 0.1 % nasal spray Place 1 spray into both nostrils 2 (two) times daily as needed for allergies.   Bevacizumab  (AVASTIN  IV) Place into the right eye.   Cholecalciferol  (VITAMIN D ) 50 MCG (2000 UT) CAPS Take 2,000 Units by mouth daily.   cyanocobalamin  (VITAMIN B12) 1000 MCG tablet Take 1,000 mcg by mouth daily.   diphenhydrAMINE  (BENADRYL ) 25 MG tablet Take 25 mg by mouth daily as needed for allergies.   diphenhydrAMINE -zinc acetate (BENADRYL ) cream Apply topically daily.   EPINEPHrine  0.3 mg/0.3 mL IJ SOAJ injection Inject 0.3 mg into the muscle as needed for anaphylaxis.   faricimab -svoa (VABYSMO ) 6 MG/0.05ML SOSY intravitreal injection 6 mg by Intravitreal route every 30 (thirty) days.   fluticasone  (FLONASE ) 50 MCG/ACT nasal spray Place 1-2 sprays into both nostrils daily as needed for allergies.   folic acid  (FOLVITE ) 1 MG tablet TAKE 2 TABLETS BY MOUTH DAILY.   gabapentin  (NEURONTIN ) 300 MG capsule Take 1 capsule (300 mg total) by mouth at bedtime.   HYDROcodone -acetaminophen  (NORCO/VICODIN) 5-325 MG tablet Take 1 tablet by mouth every 4 (four) hours as needed.   hydrocortisone  1 % ointment Apply 1 Application topically daily.   Investigational - Study Medication Take 40 mg by mouth in the morning.  Atorvastatin study med. PREVENTABLE trial   levocetirizine (XYZAL ) 5 MG tablet Take 5 mg by mouth every evening.    montelukast  (SINGULAIR ) 10 MG tablet Take 10 mg by mouth at bedtime.   Multiple Vitamins-Minerals (ICAPS AREDS 2 PO) Take 1 capsule by mouth in the  morning.   Omega-3 Fatty Acids (FISH OIL) 1000 MG CAPS Take 1,000 mg by mouth in the morning.   pantoprazole  (PROTONIX ) 40 MG tablet Take 1 tablet (40 mg total) by mouth daily.   TESTOSTERONE  COMPOUNDING KIT TD Apply 4 Pump topically every evening. 10% compounded gel from [facility] (4 clicks)   triazolam  (HALCION ) 0.25 MG tablet Take 0.25 mg by mouth at bedtime.    [DISCONTINUED] DULoxetine  (CYMBALTA ) 30 MG capsule Take 1 capsule (30 mg total) by mouth daily.   DULoxetine  (CYMBALTA ) 30 MG capsule Take 1 capsule (30 mg total) by mouth daily.   Facility-Administered Encounter Medications as of 02/03/2024  Medication   octreotide  (SANDOSTATIN  LAR) 30 MG IM injection    Past Medical History:  Diagnosis Date   Anemia    Diverticula of colon    2016 colonoscopy   Fibromyalgia    Fibromyalgia    History of GI diverticular bleed    Hypercholesteremia    Hypertension    Multiple gastric ulcers    Sleep apnea     Past Surgical History:  Procedure Laterality Date   allergy shots  weekly   BIOPSY  02/15/2023   Procedure: BIOPSY;  Surgeon: Eartha Angelia Sieving, MD;  [facility]: [facility];  Service: Gastroenterology;;   BIOPSY  08/01/2023   Procedure: BIOPSY;  Surgeon: Cinderella Deatrice FALCON, MD;  Location: [facility];  Service: Endoscopy;;   CHOLECYSTECTOMY     COLONOSCOPY     Dr.Rehman   COLONOSCOPY N/A 01/27/2015   Rehman: multiple diverticula at sigmoid colon,ext hemorrhoids   COLONOSCOPY WITH PROPOFOL  N/A 02/15/2023   Procedure: COLONOSCOPY WITH PROPOFOL ;  Surgeon: Eartha Angelia Sieving, MD;  Location: [facility];  Service: Gastroenterology;  Laterality: N/A;   ESOPHAGOGASTRODUODENOSCOPY (EGD) WITH PROPOFOL  N/A 02/15/2023   Procedure: ESOPHAGOGASTRODUODENOSCOPY (EGD) WITH PROPOFOL ;  Surgeon: Eartha Angelia Sieving, MD;  Location: [facility];  Service: Gastroenterology;  Laterality: N/A;   ESOPHAGOGASTRODUODENOSCOPY (EGD) WITH PROPOFOL  N/A 08/01/2023    Procedure: ESOPHAGOGASTRODUODENOSCOPY (EGD) WITH PROPOFOL ;  Surgeon: Cinderella Deatrice FALCON, MD;  [facility]: [facility];  Service: Endoscopy;  Laterality: N/A;  8:45AM;ASA 3, pt knows to arrive at 6:15   EYE SURGERY Bilateral    partial cornea transplants    FLEXIBLE SIGMOIDOSCOPY  03/14/2023   Procedure: FLEXIBLE SIGMOIDOSCOPY;  Surgeon: Cinderella Deatrice FALCON, MD;  [facility]: [facility];  Service: Endoscopy;;   GALLBLADDER SURGERY  12/1993   HOT HEMOSTASIS  02/15/2023   Procedure: HOT HEMOSTASIS (ARGON PLASMA COAGULATION/BICAP);  Surgeon: Eartha Angelia, Sieving, MD;  [facility]: [facility];  Service: Gastroenterology;;   PARTIAL COLECTOMY N/A 06/21/2023   Procedure: PARTIAL COLECTOMY,  repair colovesicle fistula;  Surgeon: Mavis Anes, MD;  Location: [facility];  Service: General;  Laterality: N/A;   SCLEROTHERAPY  02/15/2023   Procedure: MATIAS;  Surgeon: Eartha Angelia, Sieving, MD;  Location: [facility];  Service: Gastroenterology;;   UPPER GASTROINTESTINAL ENDOSCOPY      Family History  Problem Relation Age of Onset   Heart disease Mother    Pancreatic cancer Mother    Prostate cancer Father    Healthy Sister    Parkinson's disease Brother    Asthma Sister  Healthy Sister     Social History   Socioeconomic History   Marital status: Married    Spouse name: Not on file   Number of children: Not on file   Years of education: Not on file   Highest education level: Not on file  Occupational History   Not on file  Tobacco Use   Smoking status: Never    Passive exposure: Never   Smokeless tobacco: Never  Vaping Use   Vaping status: Never Used  Substance and Sexual Activity   Alcohol use: Not Currently   Drug use: No   Sexual activity: Not on file  Other Topics Concern   Not on file  Social History Narrative   Not on file   Social Drivers of Health   Financial Resource Strain: Not on file  Food Insecurity: No Food Insecurity (06/22/2023)   Hunger Vital Sign     Worried About Running Out of Food in the Last Year: Never true    Ran Out of Food in the Last Year: Never true  Transportation Needs: No Transportation Needs (06/22/2023)   PRAPARE - Administrator, Civil Service (Medical): No    Lack of Transportation (Non-Medical): No  Physical Activity: Not on file  Stress: Not on file  Social Connections: Socially Isolated (06/22/2023)   Social Connection and Isolation Panel    Frequency of Communication with Friends and Family: Never    Frequency of Social Gatherings with Friends and Family: Never    Attends Religious Services: Never    Database administrator or Organizations: No    Attends Banker Meetings: Never    Marital Status: Married  Catering manager Violence: Not At Risk (06/22/2023)   Humiliation, Afraid, Rape, and Kick questionnaire    Fear of Current or Ex-Partner: No    Emotionally Abused: No    Physically Abused: No    Sexually Abused: No    ROS      Objective    BP (!) 151/81   Pulse 66   Ht 5' 11 (1.803 m)   Wt 174 lb (78.9 kg)   SpO2 98%   BMI 24.27 kg/m   Physical Exam Vitals and nursing note reviewed.  Constitutional:      Appearance: Normal appearance.  HENT:     Head: Normocephalic.     Right Ear: Tympanic membrane, ear canal and external ear normal.     Left Ear: Tympanic membrane, ear canal and external ear normal.     Nose: Nose normal.     Mouth/Throat:     Mouth: Mucous membranes are moist.     Pharynx: Oropharynx is clear.  Eyes:     Extraocular Movements: Extraocular movements intact.     Pupils: Pupils are equal, round, and reactive to light.  Cardiovascular:     Rate and Rhythm: Normal rate and regular rhythm.  Pulmonary:     Effort: Pulmonary effort is normal.     Breath sounds: Normal breath sounds.  Abdominal:     General: Bowel sounds are normal.     Palpations: Abdomen is soft.  Musculoskeletal:     Cervical back: Normal range of motion and neck supple.   Neurological:     Mental Status: He is alert and oriented to person, place, and time.     Motor: Motor function is intact.     Coordination: Coordination is intact.     Gait: Gait normal.  Psychiatric:  Mood and Affect: Mood normal. Affect is blunt.        Speech: Speech is delayed.        Behavior: Behavior is slowed. Behavior is cooperative.        Thought Content: Thought content normal.        Cognition and Memory: Memory is impaired.        Judgment: Judgment normal.         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 Acetaminophen  overuse risks liver damage. Transitioning to Cymbalta , considering gabapentin  for pain relief.  - Start Cymbalta  on August 25th after completing fluoxetine  taper. - Prescribe gabapentin  at bedtime, increase as needed. - Refer to fibromyalgia clinic if available. - Monitor and reduce acetaminophen  intake to prevent liver damage.       Relevant Medications   HYDROcodone -acetaminophen  (NORCO/VICODIN) 5-325 MG tablet   gabapentin  (NEURONTIN ) 300 MG capsule   DULoxetine  (CYMBALTA ) 30 MG capsule   Depression, major, single episode, mild (HCC)   Depression managed with fluoxetine , transitioning to Cymbalta  for depressive symptoms and fibromyalgia. - Start Cymbalta  on August 25th after completing fluoxetine  taper. Cognitive dysfunction affecting medication management and daily activities.      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 Acetaminophen  overuse risks liver damage. Transitioning to Cymbalta , considering gabapentin  for pain relief.  - Start  Cymbalta  on August 25th after completing fluoxetine  taper. - Prescribe gabapentin  at bedtime, increase as needed. - Refer to fibromyalgia clinic if available. - Monitor and reduce acetaminophen  intake to prevent liver damage.  Depression Depression managed with fluoxetine , transitioning to Cymbalta  for depressive symptoms and fibromyalgia. - Start Cymbalta  on August 25th after completing fluoxetine  taper.  Cognitive dysfunction Cognitive dysfunction affecting medication management and daily activities.       Return in about 3 months (around 05/05/2024).   Leita Longs, FNP

## 2024-02-04 DIAGNOSIS — H353211 Exudative age-related macular degeneration, right eye, with active choroidal neovascularization: Secondary | ICD-10-CM | POA: Diagnosis not present

## 2024-02-05 ENCOUNTER — Inpatient Hospital Stay

## 2024-02-09 DIAGNOSIS — F320 Major depressive disorder, single episode, mild: Secondary | ICD-10-CM | POA: Insufficient documentation

## 2024-02-09 NOTE — Assessment & Plan Note (Signed)
 Depression managed with fluoxetine , transitioning to Cymbalta  for depressive symptoms and fibromyalgia. - Start Cymbalta  on August 25th after completing fluoxetine  taper. Cognitive dysfunction affecting medication management and daily activities.

## 2024-02-09 NOTE — Assessment & Plan Note (Signed)
 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 Acetaminophen  overuse risks liver damage. Transitioning to Cymbalta , considering gabapentin  for pain relief.  - Start Cymbalta  on August 25th after completing fluoxetine  taper. - Prescribe gabapentin  at bedtime, increase as needed. - Refer to fibromyalgia clinic if available. - Monitor and reduce acetaminophen  intake to prevent liver damage.

## 2024-02-12 ENCOUNTER — Inpatient Hospital Stay: Admitting: Oncology

## 2024-02-12 VITALS — BP 143/81 | HR 64 | Resp 16 | Wt 173.7 lb

## 2024-02-12 DIAGNOSIS — Z79899 Other long term (current) drug therapy: Secondary | ICD-10-CM | POA: Diagnosis not present

## 2024-02-12 DIAGNOSIS — K922 Gastrointestinal hemorrhage, unspecified: Secondary | ICD-10-CM | POA: Diagnosis not present

## 2024-02-12 DIAGNOSIS — D500 Iron deficiency anemia secondary to blood loss (chronic): Secondary | ICD-10-CM

## 2024-02-12 DIAGNOSIS — E871 Hypo-osmolality and hyponatremia: Secondary | ICD-10-CM

## 2024-02-12 DIAGNOSIS — M791 Myalgia, unspecified site: Secondary | ICD-10-CM | POA: Insufficient documentation

## 2024-02-12 DIAGNOSIS — E538 Deficiency of other specified B group vitamins: Secondary | ICD-10-CM | POA: Diagnosis not present

## 2024-02-12 DIAGNOSIS — R760 Raised antibody titer: Secondary | ICD-10-CM | POA: Diagnosis not present

## 2024-02-12 DIAGNOSIS — M797 Fibromyalgia: Secondary | ICD-10-CM | POA: Diagnosis not present

## 2024-02-12 NOTE — Assessment & Plan Note (Addendum)
-  Sodium level 128. 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  -Discussed increasing salt in his diet and could start drinking Gatorade or propel to help with electrolyte replacement.

## 2024-02-12 NOTE — Progress Notes (Signed)
 [facility] OFFICE PROGRESS NOTE  Cole Doffing, FNP  ASSESSMENT & PLAN:    Assessment & Plan Iron  deficiency anemia due to chronic blood loss - Most recent IV iron  with INFeD  x 1000 mg on 07/18/2023. - He is taking daily iron  pill - No rectal bleeding or melena  - Labs from 01/27/2024 show iron  saturation 29% with an elevated ferritin.  Hemoglobin is normal at 13.7. -Recommend decreasing iron  tablet to every other day.  He does not need any additional IV iron  at this time. - Labs and RTC in 4 months.  Vitamin B12 deficiency disease - He is taking vitamin B12 1000 mcg daily - Most recent labs show improvement of his B12 level. -Recommend reducing B12 to every other day. -Recheck B12/MMA at follow-up in 4 months. 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  He has intermittent [name] phenomena. - Patient had positive antibeta-2 glycoprotein 1 IgM antibody (greater than 112) twice in March and July of this year.  He also has anticardiolipin antibody IgM positive (greater than 112) on 2 different occasions at least 3 months apart.  Lupus anticoagulant was negative twice. - He does not have any history of arterial or venous thrombosis.  No history of strokes. - Patients with positive antiphospholipid antibody and other connective tissue disorders are at increased risk for thrombosis based on several studies, but full dose anticoagulation is not indicated in the absence of prior thrombotic events. - He was placed on aspirin  325 mg for primary prophylaxis, but this was discontinued following his acute GI bleeding in 2024  - Could consider aspirin  81 mg daily for primary prophylaxis of VTE, if gastroenterologist approves. - We have discussed symptoms of DVT/PE that would warrant immediate medical evaluation. Hyponatremia -Sodium level 128. 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  -Discussed increasing salt in his diet and could start drinking Gatorade or propel to help with electrolyte replacement. Myalgia Reports history of fibromyalgia. He has most recently been put on Cymbalta  30 mg daily along with gabapentin  300 mg at bedtime. He is followed by rheumatology as well as his PCP. Reports significant pain in rotating joints. Wife is concerned about the amount of Tylenol  he has been taking in a 24-hour period. Discussed no more than 4 g of Tylenol  per day and ideally would like it less than 3 g. Reach out to rheumatology if no improvement with Cymbalta  and gabapentin .  Orders Placed This Encounter  Procedures   Ferritin    Standing Status:   Future    Expected Date:   06/13/2024    Expiration Date:   09/11/2024    Release to patient:   Immediate   Iron  and TIBC    Standing Status:   Future    Expected Date:   06/13/2024    Expiration Date:   09/11/2024    Release to patient:   Immediate   CBC with Differential/Platelet    Standing Status:   Future    Expected Date:   06/13/2024    Expiration Date:   09/11/2024    Release to patient:   Immediate   Comprehensive metabolic panel with GFR    Standing Status:   Future    Expected Date:   06/13/2024    Expiration Date:   09/11/2024   Methylmalonic acid, serum    Standing Status:   Future    Expected Date:   06/13/2024  Expiration Date:   09/11/2024   Vitamin B12    Standing Status:   Future    Expected Date:   06/13/2024    Expiration Date:   09/11/2024    INTERVAL HISTORY: Patient returns for follow-up for antiphospholipid antibody syndrome, iron  deficiency anemia and vitamin B12 deficiency.  He receives intermittent IV iron  last given on 07/18/2023 (INFeD ) 1 g.  In the interim, he was evaluated in the [facility] ED for headache, body aches.  Workup was essentially unremarkable and thought to be due to antianxiety or depression medicine.  Patient wife reports that he is in  constant pain.  Reports he uses Tylenol  all the time and often times will take 6000 mg in 1 day.  He is followed by rheumatology and he was recently started on Cymbalta  and gabapentin  was added at bedtime.  He has not noticed any difference since taking these medications but is only been a few days to 1 week.  Reports his appetite is 50% energy levels are 25%.  He has shortness of breath with exertion and nausea at times.  Has increased urinary frequency.  We reviewed ferritin, iron  panel, CBC and CMP.  MMA, B12.  SUMMARY OF HEMATOLOGIC HISTORY: Oncology History   No history exists.     CBC    Component Value Date/Time   WBC 5.9 01/27/2024 1308   RBC 4.17 (L) 01/27/2024 1308   HGB 13.7 01/27/2024 1308   HGB 14.6 12/23/2023 0843   HCT 38.9 (L) 01/27/2024 1308   HCT 43.2 12/23/2023 0843   PLT 262 01/27/2024 1308   PLT 217 12/23/2023 0843   MCV 93.3 01/27/2024 1308   MCV 98 (H) 12/23/2023 0843   MCH 32.9 01/27/2024 1308   MCHC 35.2 01/27/2024 1308   RDW 14.0 01/27/2024 1308   RDW 13.2 12/23/2023 0843   LYMPHSABS 1.0 01/27/2024 1308   LYMPHSABS 0.8 12/23/2023 0843   MONOABS 0.5 01/27/2024 1308   EOSABS 0.0 01/27/2024 1308   EOSABS 0.1 12/23/2023 0843   BASOSABS 0.1 01/27/2024 1308   BASOSABS 0.0 12/23/2023 0843       Latest Ref Rng & Units 01/27/2024    1:08 PM 01/25/2024    4:11 PM 12/23/2023    8:43 AM  CMP  Glucose 70 - 99 mg/dL 95  880  888   BUN 8 - 23 mg/dL 15  21  17    Creatinine 0.61 - 1.24 mg/dL 9.32  9.30  9.12   Sodium 135 - 145 mmol/L 128  125  129   Potassium 3.5 - 5.1 mmol/L 4.0  3.9  4.6   Chloride 98 - 111 mmol/L 93  92  94   CO2 22 - 32 mmol/L 24  19  21    Calcium 8.9 - 10.3 mg/dL 9.0  9.1  9.5   Total Protein 6.5 - 8.1 g/dL 7.0  7.2  6.9   Total Bilirubin 0.0 - 1.2 mg/dL 1.0  0.9  0.5   Alkaline Phos 38 - 126 U/L 109  101  121   AST 15 - 41 U/L 25  32  17   ALT 0 - 44 U/L 42  40  24      Lab Results  Component Value Date   FERRITIN 587 (H) 01/27/2024    VITAMINB12 872 01/27/2024    There were no vitals filed for this visit.  Review of System:  Review of Systems  Constitutional:  Positive for malaise/fatigue.  Respiratory:  Positive for shortness  of breath.   Gastrointestinal:  Positive for nausea.  Genitourinary:  Positive for frequency.  Musculoskeletal:  Positive for back pain, joint pain and myalgias.    Physical Exam: Physical Exam Constitutional:      Appearance: Normal appearance.  HENT:     Head: Normocephalic and atraumatic.  Eyes:     Pupils: Pupils are equal, round, and reactive to light.  Cardiovascular:     Rate and Rhythm: Normal rate and regular rhythm.     Heart sounds: Normal heart sounds. No murmur heard. Pulmonary:     Effort: Pulmonary effort is normal.     Breath sounds: Normal breath sounds. No wheezing.  Abdominal:     General: Bowel sounds are normal. There is no distension.     Palpations: Abdomen is soft.     Tenderness: There is no abdominal tenderness.  Musculoskeletal:        General: Normal range of motion.     Cervical back: Normal range of motion.  Skin:    General: Skin is warm and dry.     Findings: No rash.  Neurological:     Mental Status: He is alert and oriented to person, place, and time.     Gait: Gait is intact.  Psychiatric:        Mood and Affect: Mood and affect normal.        Cognition and Memory: Memory normal.        Judgment: Judgment normal.      I spent 25 minutes dedicated to the care of this patient (face-to-face and non-face-to-face) on the date of the encounter to include what is described in the assessment and plan.,  Delon Hope, NP 02/12/2024 9:51 AM

## 2024-02-12 NOTE — Assessment & Plan Note (Addendum)
-   He is taking vitamin B12 1000 mcg daily - Most recent labs show improvement of his B12 level. -Recommend reducing B12 to every other day. -Recheck B12/MMA at follow-up in 4 months.

## 2024-02-12 NOTE — Assessment & Plan Note (Addendum)
-   Most recent IV iron  with INFeD  x 1000 mg on 07/18/2023. - He is taking daily iron  pill - No rectal bleeding or melena  - Labs from 01/27/2024 show iron  saturation 29% with an elevated ferritin.  Hemoglobin is normal at 13.7. -Recommend decreasing iron  tablet to every other day.  He does not need any additional IV iron  at this time. - Labs and RTC in 4 months.

## 2024-02-12 NOTE — Assessment & Plan Note (Addendum)
-   Patient was diagnosed with autoimmune disease (inflammatory arthritis) and fibromyalgia.  He has intermittent [name] phenomena. - Patient had positive antibeta-2 glycoprotein 1 IgM antibody (greater than 112) twice in March and July of this year.  He also has anticardiolipin antibody IgM positive (greater than 112) on 2 different occasions at least 3 months apart.  Lupus anticoagulant was negative twice. - He does not have any history of arterial or venous thrombosis.  No history of strokes. - Patients with positive antiphospholipid antibody and other connective tissue disorders are at increased risk for thrombosis based on several studies, but full dose anticoagulation is not indicated in the absence of prior thrombotic events. - He was placed on aspirin  325 mg for primary prophylaxis, but this was discontinued following his acute GI bleeding in 2024  - Could consider aspirin  81 mg daily for primary prophylaxis of VTE, if gastroenterologist approves. - We have discussed symptoms of DVT/PE that would warrant immediate medical evaluation.

## 2024-02-12 NOTE — Assessment & Plan Note (Addendum)
 Reports history of fibromyalgia. He has most recently been put on Cymbalta  30 mg daily along with gabapentin  300 mg at bedtime. He is followed by rheumatology as well as his PCP. Reports significant pain in rotating joints. Wife is concerned about the amount of Tylenol  he has been taking in a 24-hour period. Discussed no more than 4 g of Tylenol  per day and ideally would like it less than 3 g. Reach out to rheumatology if no improvement with Cymbalta  and gabapentin .

## 2024-02-25 ENCOUNTER — Encounter (INDEPENDENT_AMBULATORY_CARE_PROVIDER_SITE_OTHER): Payer: Self-pay | Admitting: Gastroenterology

## 2024-03-02 ENCOUNTER — Other Ambulatory Visit: Payer: Self-pay

## 2024-03-02 MED ORDER — COVID-19 MRNA VAC-TRIS(PFIZER) 30 MCG/0.3ML IM SUSY
0.3000 mL | PREFILLED_SYRINGE | Freq: Once | INTRAMUSCULAR | 0 refills | Status: AC
Start: 2024-03-02 — End: 2024-03-02

## 2024-03-09 DIAGNOSIS — H353211 Exudative age-related macular degeneration, right eye, with active choroidal neovascularization: Secondary | ICD-10-CM | POA: Diagnosis not present

## 2024-03-13 ENCOUNTER — Other Ambulatory Visit: Payer: Self-pay

## 2024-03-13 DIAGNOSIS — F320 Major depressive disorder, single episode, mild: Secondary | ICD-10-CM

## 2024-03-13 DIAGNOSIS — M797 Fibromyalgia: Secondary | ICD-10-CM

## 2024-03-13 MED ORDER — DULOXETINE HCL 60 MG PO CPEP: 60.0000 mg | ORAL_CAPSULE | Freq: Every day | ORAL | 3 refills | Status: AC

## 2024-03-31 NOTE — Telephone Encounter (Signed)
 Recommend in-person evaluation with PCP if the patient's pain in more severe and not related to fibromyalgia.

## 2024-04-01 ENCOUNTER — Encounter (INDEPENDENT_AMBULATORY_CARE_PROVIDER_SITE_OTHER): Payer: Self-pay | Admitting: Gastroenterology

## 2024-04-01 ENCOUNTER — Other Ambulatory Visit: Payer: Self-pay

## 2024-04-02 NOTE — Telephone Encounter (Signed)
 The patient did not have active inflammation on exam at his last visit.   Methotrexate  and tylenol  can both increase the risk for hepatic toxicity. Methotrexate  is a high risk medication that will require close monitoring.  If he is having increased joint pain I would recommend an ultrasound of his hands or feet to assess for synovitis.    If his pain is from fibromyalgia-methotrexate  will not help.

## 2024-04-06 ENCOUNTER — Other Ambulatory Visit: Payer: Self-pay

## 2024-04-06 DIAGNOSIS — F320 Major depressive disorder, single episode, mild: Secondary | ICD-10-CM

## 2024-04-06 MED ORDER — TRIAZOLAM 0.25 MG PO TABS: 0.2500 mg | ORAL_TABLET | Freq: Every day | ORAL | 0 refills | Status: DC

## 2024-04-13 ENCOUNTER — Other Ambulatory Visit: Payer: Self-pay

## 2024-04-16 ENCOUNTER — Ambulatory Visit

## 2024-04-27 ENCOUNTER — Other Ambulatory Visit: Payer: Self-pay

## 2024-04-27 DIAGNOSIS — E7800 Pure hypercholesterolemia, unspecified: Secondary | ICD-10-CM

## 2024-04-28 LAB — LIPID PANEL
Chol/HDL Ratio: 3.3 ratio (ref 0.0–5.0)
Cholesterol, Total: 153 mg/dL (ref 100–199)
HDL: 46 mg/dL (ref >39)
LDL Chol Calc (NIH): 88 mg/dL (ref 0–99)
Triglycerides: 105 mg/dL (ref 0–149)
VLDL Cholesterol Cal: 19 mg/dL (ref 5–40)

## 2024-04-28 LAB — TSH+FREE T4
Free T4: 1.19 ng/dL (ref 0.82–1.77)
TSH: 0.97 u[IU]/mL (ref 0.450–4.500)

## 2024-04-28 LAB — CMP14+EGFR
ALT: 19 IU/L (ref 0–44)
AST: 17 IU/L (ref 0–40)
Albumin: 4.4 g/dL (ref 3.8–4.8)
Alkaline Phosphatase: 105 IU/L (ref 47–123)
BUN/Creatinine Ratio: 19 (ref 10–24)
BUN: 17 mg/dL (ref 8–27)
Bilirubin Total: 0.6 mg/dL (ref 0.0–1.2)
CO2: 23 mmol/L (ref 20–29)
Calcium: 9.6 mg/dL (ref 8.6–10.2)
Chloride: 96 mmol/L (ref 96–106)
Creatinine, Ser: 0.91 mg/dL (ref 0.76–1.27)
Globulin, Total: 2.5 g/dL (ref 1.5–4.5)
Glucose: 101 mg/dL — ABNORMAL HIGH (ref 70–99)
Potassium: 4.1 mmol/L (ref 3.5–5.2)
Sodium: 134 mmol/L (ref 134–144)
Total Protein: 6.9 g/dL (ref 6.0–8.5)
eGFR: 85 mL/min/1.73 (ref >59)

## 2024-05-01 ENCOUNTER — Ambulatory Visit (INDEPENDENT_AMBULATORY_CARE_PROVIDER_SITE_OTHER)

## 2024-05-01 DIAGNOSIS — M797 Fibromyalgia: Secondary | ICD-10-CM | POA: Diagnosis not present

## 2024-05-01 MED ORDER — GABAPENTIN 300 MG PO CAPS: 300.0000 mg | ORAL_CAPSULE | Freq: Three times a day (TID) | ORAL | 3 refills | Status: AC

## 2024-05-01 NOTE — Progress Notes (Signed)
 Established Patient Office Visit  Subjective   Patient ID: Cole Brown, male    DOB: 08-16-1945  Age: 78 y.o. MRN: [id number]  Chief Complaint  Patient presents with   Medical Management of Chronic Issues    3 month follow up     HPI  Past Medical History:  Diagnosis Date   Anemia    Diverticula of colon    2016 colonoscopy   Fibromyalgia    Fibromyalgia    History of GI diverticular bleed    Hypercholesteremia    Hypertension    Multiple gastric ulcers    Sleep apnea     ROS    Objective:     BP (!) 161/93   Pulse 70   Ht 5' 11 (1.803 m)   Wt 181 lb 0.6 oz (82.1 kg)   SpO2 95%   BMI 25.25 kg/m  BP Readings from Last 3 Encounters:  05/01/24 (!) 161/93  02/12/24 (!) 143/81  02/03/24 (!) 151/81   Wt Readings from Last 3 Encounters:  05/01/24 181 lb 0.6 oz (82.1 kg)  02/12/24 173 lb 11.6 oz (78.8 kg)  02/03/24 174 lb (78.9 kg)      Physical Exam Vitals and nursing note reviewed.  Constitutional:      Appearance: Normal appearance.  HENT:     Head: Normocephalic.  Eyes:     Extraocular Movements: Extraocular movements intact.     Pupils: Pupils are equal, round, and reactive to light.  Cardiovascular:     Rate and Rhythm: Normal rate and regular rhythm.  Pulmonary:     Effort: Pulmonary effort is normal.     Breath sounds: Normal breath sounds.  Musculoskeletal:     Cervical back: Normal range of motion and neck supple.  Neurological:     Mental Status: He is alert and oriented to person, place, and time.  Psychiatric:        Mood and Affect: Mood normal.        Thought Content: Thought content normal.      No results found for any visits on 05/01/24.  Last CBC Lab Results  Component Value Date   WBC 5.9 01/27/2024   HGB 13.7 01/27/2024   HCT 38.9 (L) 01/27/2024   MCV 93.3 01/27/2024   MCH 32.9 01/27/2024   RDW 14.0 01/27/2024   PLT 262 01/27/2024   Last metabolic panel Lab Results  Component Value Date   GLUCOSE 101 (H)  04/27/2024   NA 134 04/27/2024   K 4.1 04/27/2024   CL 96 04/27/2024   CO2 23 04/27/2024   BUN 17 04/27/2024   CREATININE 0.91 04/27/2024   EGFR 85 04/27/2024   CALCIUM 9.6 04/27/2024   PHOS 4.3 06/24/2023   PROT 6.9 04/27/2024   ALBUMIN 4.4 04/27/2024   LABGLOB 2.5 04/27/2024   AGRATIO 0.8 05/01/2023   BILITOT 0.6 04/27/2024   ALKPHOS 105 04/27/2024   AST 17 04/27/2024   ALT 19 04/27/2024   ANIONGAP 11 01/27/2024   Last lipids Lab Results  Component Value Date   CHOL 153 04/27/2024   HDL 46 04/27/2024   LDLCALC 88 04/27/2024   TRIG 105 04/27/2024   CHOLHDL 3.3 04/27/2024   Last hemoglobin A1c No results found for: HGBA1C Last thyroid  functions Lab Results  Component Value Date   TSH 0.970 04/27/2024   FREET4 1.19 04/27/2024   Last vitamin D  No results found for: 25OHVITD2, 25OHVITD3, VD25OH Last vitamin B12 and Folate Lab Results  Component Value  Date   VITAMINB12 872 01/27/2024   FOLATE >40.0 05/01/2023      The ASCVD Risk score (Arnett DK, et al., 2019) failed to calculate for the following reasons:   The 2019 ASCVD risk score is only valid for ages 10 to 48    Assessment & Plan:   Problem List Items Addressed This Visit       Other   Fibromyalgia   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  He reports little improvement of fibromyalgia with addition of Cymbalta .   Will increase dose of gabapentin  from bedtime to three times a day.         Relevant Medications   gabapentin  (NEURONTIN ) 300 MG capsule    Return in about 6 months (around 10/29/2024) for chronic follow-up with PCP.    Leita Longs, FNP

## 2024-05-05 NOTE — Assessment & Plan Note (Signed)
 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  He reports little improvement of fibromyalgia with addition of Cymbalta .   Will increase dose of gabapentin  from bedtime to three times a day.

## 2024-06-16 ENCOUNTER — Other Ambulatory Visit: Payer: Self-pay

## 2024-06-16 MED ORDER — ATENOLOL 25 MG PO TABS: 12.5000 mg | ORAL_TABLET | Freq: Every day | ORAL | 3 refills | Status: DC

## 2024-06-16 MED ORDER — AMLODIPINE BESYLATE 5 MG PO TABS: 5.0000 mg | ORAL_TABLET | Freq: Every day | ORAL | 3 refills | Status: AC

## 2024-06-24 NOTE — Progress Notes (Unsigned)
 "  Office Visit Note  Patient: Cole Brown             Date of Birth: 1946/03/20           MRN: [id number]             PCP: Bevely Doffing, FNP Referring: Marvine Rush, MD Visit Date: 07/08/2024 Occupation: Data Unavailable  Subjective:  Discuss lab results   History of Present Illness: Cole Brown is a 79 y.o. male with history of autoimmune disease.  He is not currently taking immunosuppressive agents.  Patient was accompanied by his wife today.  He presents today to discuss recent lab results from 07/02/2024. Patient reports that his energy level has improved as well as his mobility and arthralgias.  His symptoms of fibromyalgia have been better controlled on the current treatment regimen.  He has been taking Cymbalta  and gabapentin  as prescribed.  He has also been going to exercise class 3 to 4 days/week which he has found to help alleviate his joint stiffness and myalgias.    Activities of Daily Living:  Patient reports morning stiffness for 0 minute.   Patient Denies nocturnal pain.  Difficulty dressing/grooming: Denies Difficulty climbing stairs: Denies Difficulty getting out of chair: Reports Difficulty using hands for taps, buttons, cutlery, and/or writing: Denies  Review of Systems  Constitutional:  Positive for fatigue.  HENT:  Positive for mouth dryness. Negative for mouth sores.   Eyes:  Positive for dryness.  Respiratory:  Negative for shortness of breath.   Cardiovascular:  Negative for chest pain and palpitations.  Gastrointestinal:  Negative for blood in stool, constipation and diarrhea.  Endocrine: Negative for increased urination.  Genitourinary:  Positive for involuntary urination.  Musculoskeletal:  Positive for myalgias, muscle tenderness and myalgias. Negative for joint pain, gait problem, joint pain, joint swelling, muscle weakness and morning stiffness.  Skin:  Negative for color change, rash, hair loss and sensitivity to sunlight.   Allergic/Immunologic: Negative for susceptible to infections.  Neurological:  Negative for dizziness and headaches.  Hematological:  Negative for swollen glands.  Psychiatric/Behavioral:  Negative for depressed mood and sleep disturbance. The patient is not nervous/anxious.     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   Gastrointestinal hemorrhage 02/15/2023   Protein-calorie malnutrition, severe 02/14/2023   Acute blood loss anemia 02/12/2023   Perforation of sigmoid colon (HCC) 01/08/2023   Acute post-hemorrhagic anemia 01/08/2023   Diverticulitis 01/03/2023   Hyponatremia 01/03/2023   Hematochezia 12/25/2022   Diverticulosis of colon without hemorrhage 12/25/2022   Diarrhea 03/30/2021   Hemorrhoids 03/30/2021   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   Lower abdominal pain 07/06/2019   Autoimmune disease 09/15/2018   [name] disease without gangrene 09/15/2018   Hypercholesteremia 08/21/2018   History of diverticulitis 08/21/2018   History of iron  deficiency anemia 08/21/2018   Fibromyalgia 08/21/2018   History of multiple allergies 08/21/2018   History of sleep apnea 08/21/2018   Rectal bleed 11/14/2016   Essential hypertension 11/14/2016   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    Past Medical History:  Diagnosis  Date   Anemia    Diverticula of colon    2016 colonoscopy   Fibromyalgia    Fibromyalgia    History of GI diverticular bleed    Hypercholesteremia    Hypertension    Multiple gastric ulcers    Sleep apnea      Family History  Problem Relation Age of Onset   Heart disease Mother    Pancreatic cancer Mother    Prostate cancer Father    Healthy Sister    Parkinson's disease Brother    Asthma Sister    Healthy Sister    Past Surgical History:  Procedure Laterality Date   allergy shots  weekly   BIOPSY  02/15/2023   Procedure: BIOPSY;  Surgeon: Eartha Angelia Sieving, MD;  Location: [facility];  Service: Gastroenterology;;   BIOPSY  08/01/2023   Procedure: BIOPSY;  Surgeon: Cinderella Deatrice FALCON, MD;  Location: [facility];  Service: Endoscopy;;   CHOLECYSTECTOMY     COLONOSCOPY     Dr.Rehman   COLONOSCOPY N/A 01/27/2015   Rehman: multiple diverticula at sigmoid colon,ext hemorrhoids   COLONOSCOPY WITH PROPOFOL  N/A 02/15/2023   Procedure: COLONOSCOPY WITH PROPOFOL ;  Surgeon: Eartha Angelia Sieving, MD;  Location: [facility];  Service: Gastroenterology;  Laterality: N/A;   ESOPHAGOGASTRODUODENOSCOPY (EGD) WITH PROPOFOL  N/A 02/15/2023   Procedure: ESOPHAGOGASTRODUODENOSCOPY (EGD) WITH PROPOFOL ;  Surgeon: Eartha Angelia Sieving, MD;  Location: [facility];  Service: Gastroenterology;  Laterality: N/A;   ESOPHAGOGASTRODUODENOSCOPY (EGD) WITH PROPOFOL  N/A 08/01/2023   Procedure: ESOPHAGOGASTRODUODENOSCOPY (EGD) WITH PROPOFOL ;  Surgeon: Cinderella Deatrice FALCON, MD;  Location: [facility];  Service: Endoscopy;  Laterality: N/A;  8:45AM;ASA 3, pt knows to arrive at 6:15   EYE SURGERY Bilateral    partial cornea transplants    FLEXIBLE SIGMOIDOSCOPY  03/14/2023   Procedure: FLEXIBLE SIGMOIDOSCOPY;  Surgeon: Cinderella Deatrice FALCON, MD;  Location: [facility];  Service: Endoscopy;;   GALLBLADDER SURGERY  12/1993   HOT HEMOSTASIS  02/15/2023   Procedure: HOT HEMOSTASIS (ARGON PLASMA COAGULATION/BICAP);  Surgeon: Eartha Angelia, Sieving, MD;  Location: [facility];  Service: Gastroenterology;;   PARTIAL COLECTOMY N/A 06/21/2023   Procedure: PARTIAL COLECTOMY,  repair colovesicle fistula;  Surgeon:  Mavis Anes, MD;  Location: [facility];  Service: General;  Laterality: N/A;   SCLEROTHERAPY  02/15/2023   Procedure: SCLEROTHERAPY;  Surgeon: Eartha Angelia, Sieving, MD;  Location: [facility];  Service: Gastroenterology;;   UPPER GASTROINTESTINAL ENDOSCOPY     Social History[1] Social History   Social History Narrative   Not on file     Immunization History  Administered Date(s) Administered   Fluad Trivalent(High Dose 65+) 02/29/2024   Influenza-Unspecified 04/16/2020   PFIZER(Purple Top)SARS-COV-2 Vaccination 08/05/2019, 08/26/2019   Zoster Recombinant(Shingrix) 01/03/2018, 06/12/2018     Objective: Vital Signs: BP (!) 171/96   Pulse 61   Temp 97.9 F (36.6 C)   Resp 14   Ht 5' 11 (1.803 m)   Wt 190 lb 12.8 oz (86.5 kg)   BMI 26.61 kg/m    Physical Exam Vitals and nursing note reviewed.  Constitutional:      Appearance: He is well-developed.  HENT:     Head: Normocephalic and atraumatic.  Eyes:     Conjunctiva/sclera: Conjunctivae normal.     Pupils: Pupils are equal, round, and reactive to light.  Cardiovascular:     Rate and Rhythm: Normal rate and regular rhythm.     Heart sounds: Normal heart sounds.  Pulmonary:  Effort: Pulmonary effort is normal.     Breath sounds: Normal breath sounds.  Abdominal:     General: Bowel sounds are normal.     Palpations: Abdomen is soft.  Musculoskeletal:     Cervical back: Normal range of motion and neck supple.  Skin:    General: Skin is warm and dry.     Capillary Refill: Capillary refill takes less than 2 seconds.  Neurological:     Mental Status: He is alert and oriented to person, place, and time.  Psychiatric:        Behavior: Behavior normal.      Musculoskeletal Exam: C-spine is slightly limited range of motion without rotation.  Postural thoracic kyphosis noted.  Shoulder joint range of motion has improved.  No tenderness along the elbow joint line.  CMC, PIP, DIP thickening consistent with  osteoarthritis of both hands.  Synovial thickening of MCP joints but no active synovitis noted.  Hip joints have good range of motion with no groin pain.  Knee joints have good range of motion with no warmth or effusion.  CDAI Exam: CDAI Score: -- Patient Global: --; Provider Global: -- Swollen: --; Tender: -- Joint Exam 07/08/2024   No joint exam has been documented for this visit   There is currently no information documented on the homunculus. Go to the Rheumatology activity and complete the homunculus joint exam.  Investigation: No additional findings.  Imaging: No results found.  Recent Labs: Lab Results  Component Value Date   WBC 6.2 07/02/2024   HGB 14.2 07/02/2024   PLT 214 07/02/2024   NA 135 07/02/2024   K 5.3 (H) 07/02/2024   CL 99 07/02/2024   CO2 24 07/02/2024   GLUCOSE 138 (H) 07/02/2024   BUN 22 07/02/2024   CREATININE 0.86 07/02/2024   BILITOT 0.6 07/02/2024   ALKPHOS 157 (H) 07/02/2024   AST 17 07/02/2024   ALT 19 07/02/2024   PROT 7.0 07/02/2024   ALBUMIN 4.3 07/02/2024   CALCIUM 9.3 07/02/2024   GFRAA 89 03/22/2020   QFTBGOLDPLUS NEGATIVE 08/21/2018    Speciality Comments: Plaquenil  April 2020-discontinued due to rash Methotrexate  is started Nov 06, 2018  Procedures:  No procedures performed Allergies: Bee pollen, Plaquenil  [hydroxychloroquine  sulfate], and Lodine [etodolac]   Assessment / Plan:     Visit Diagnoses: Autoimmune disease - ANA+ in 2018, ENA negative, [name] phenomenon, beta-2  GP 1 IgM positive, anticardiolipin IgM positive, elevated ESR, severe inflammatory arthritis: Reviewed lab results from 07/02/2024 with the patient and his wife today in detail: ANA positive-no titer, complements within normal limits, double-stranded DNA negative, ESR within normal limits (improved), CBC within normal limits, potassium 5.3, alk phos was elevated at 157, beta-2  glycoprotein IgM remains positive >150-unchanged (IgA and IgG remain negative),  anticardiolipin IgM low positive-38--trending down (IgA and IgG remain negative).   Discussed that these labs are not consistent with active lupus at this time.  Discussed that he does not require immunosuppressive therapy at this time. His energy level, myalgias, and arthralgias have improved. No synovitis noted. He has been able to be more active going to exercise class 3 to 4 days/week.  His symptoms have been better controlled since taking Cymbalta  and gabapentin  as prescribed. He will notify us  if he develops any signs or symptoms of a flare.  Patient plans on having updated lab work 1 week prior to his next follow-up visit scheduled in 6 months.  High risk medication use -He does not require immunosuppressive therapy at this  time.  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 Katradadda.Anticardiolipin IgM,beta-2  glycoprotein IgM remain positive. Previously d/c ASA- GI bleed/chronic blood loss/IDA.   Anticardiolipin antibodies rechecked on 12/23/2023: IgM 52 down from >122  on 05/27/23. Beta-2  glycoprotein I >150 on 12/23/23.   Reviewed lab results from 07/02/2024 with the patient and his wife today.  Anticardiolipin IgM continues to trend down and is now low positive-38.  Beta-2  IgM remains positive-unchanged.  Scheduled to see hematology on 08/14/24.   [name] disease without gangrene - He takes amlodipine  5 mg 1 tablet daily.  No signs of sclerodactyly at this time.  History of iron  deficiency anemia - History of gastric and duodenal ulcer-Due to chronic blood loss.  Under the care of gastroenterology and hematology.  Underwent EGD on 08/01/23-biopsies negative.  Remains under the care of hematology.   Primary osteoarthritis of both hands: He has CMC, PIP, DIP thickening consistent with  osteoarthritis of both hands.  No synovitis noted today.  Fibromyalgia: His symptoms of fibromyalgia have been significantly better controlled since taking Cymbalta  and gabapentin .  He has been able to increase his exercise regimen and has been going to exercise class 3 to 4 days/week.  His mobility has improved along with his strength.  His energy level has also seemed to improve.  Trochanteric bursitis of both hips: Not currently symptomatic.  Improved.    Alkaline phosphatase elevation -Alk phos was elevated at 157 on 07/02/2024.  Future order for alk phos isoenzymes was placed today for [facility].  Patient is planning to have updated lab work with cardiology and hematology soon and will try to see if the isoenzymes can be added at that time if not he will go to [facility] to have this updated.  Plan: Alkaline phosphatase, isoenzymes  Other medical conditions are listed as follows:   History of diverticulitis  Fuchs' corneal dystrophy, unspecified laterality  Essential hypertension: Blood pressure was elevated today in the office and was rechecked prior to leaving.  Patient was advised to monitor blood pressure closely and reach out to PCP blood pressure remains elevated.  History of sleep apnea  Hypercholesteremia  History of multiple allergies    Orders: Orders Placed This Encounter  Procedures   Alkaline phosphatase, isoenzymes   No orders of the defined types were placed in this encounter.     Follow-Up Instructions: Return in about 6 months (around 01/05/2025) for Autoimmune Disease.   Waddell CHRISTELLA Craze, PA-C  Note - This record has been created using Dragon software.  Chart creation errors have been sought, but may not always  have been located. Such creation errors do not reflect on  the standard of medical care.     [1]  Social History Tobacco Use   Smoking status: Never    Passive exposure: Never   Smokeless tobacco: Never  Vaping Use   Vaping status: Never Used   Substance Use Topics   Alcohol use: Not Currently   Drug use: No   "

## 2024-07-01 ENCOUNTER — Other Ambulatory Visit: Payer: Self-pay

## 2024-07-01 DIAGNOSIS — Z79899 Other long term (current) drug therapy: Secondary | ICD-10-CM

## 2024-07-01 DIAGNOSIS — M359 Systemic involvement of connective tissue, unspecified: Secondary | ICD-10-CM

## 2024-07-03 ENCOUNTER — Ambulatory Visit: Payer: Self-pay | Admitting: Rheumatology

## 2024-07-03 NOTE — Progress Notes (Signed)
 CBC normal, glucose is mildly elevated, probably not a fasting sample.  Potassium is mildly elevated (likely hemolyzed sample), alkaline phosphatase is elevated.  Repeat alkaline phosphatase isoenzymes in 1 month, complements normal, sed rate normal, beta-2  GP 1 and anticardiolipin antibodies pending, double-stranded DNA pending.

## 2024-07-04 ENCOUNTER — Other Ambulatory Visit: Payer: Self-pay

## 2024-07-04 DIAGNOSIS — F320 Major depressive disorder, single episode, mild: Secondary | ICD-10-CM

## 2024-07-04 MED ORDER — TRIAZOLAM 0.25 MG PO TABS: 0.2500 mg | ORAL_TABLET | Freq: Every day | ORAL | 0 refills | Status: AC

## 2024-07-07 LAB — CBC WITH DIFFERENTIAL/PLATELET
Basophils Absolute: 0.1 x10E3/uL (ref 0.0–0.2)
Basos: 1 %
EOS (ABSOLUTE): 0.1 x10E3/uL (ref 0.0–0.4)
Eos: 2 %
Hematocrit: 41.9 % (ref 37.5–51.0)
Hemoglobin: 14.2 g/dL (ref 13.0–17.7)
Immature Grans (Abs): 0 x10E3/uL (ref 0.0–0.1)
Immature Granulocytes: 0 %
Lymphocytes Absolute: 1.2 x10E3/uL (ref 0.7–3.1)
Lymphs: 18 %
MCH: 32.5 pg (ref 26.6–33.0)
MCHC: 33.9 g/dL (ref 31.5–35.7)
MCV: 96 fL (ref 79–97)
Monocytes Absolute: 0.4 x10E3/uL (ref 0.1–0.9)
Monocytes: 6 %
Neutrophils Absolute: 4.5 x10E3/uL (ref 1.4–7.0)
Neutrophils: 73 %
Platelets: 214 x10E3/uL (ref 150–450)
RBC: 4.37 x10E6/uL (ref 4.14–5.80)
RDW: 11.9 % (ref 11.6–15.4)
WBC: 6.2 x10E3/uL (ref 3.4–10.8)

## 2024-07-07 LAB — CARDIOLIPIN ANTIBODIES, IGG, IGM, IGA
Anticardiolipin IgA: <9 U/mL (ref 0–11)
Anticardiolipin IgG: <9 (ref 0–14)
Anticardiolipin IgM: 38 [MPL'U]/mL — AB (ref 0–12)

## 2024-07-07 LAB — CMP14+EGFR
ALT: 19 IU/L (ref 0–44)
AST: 17 IU/L (ref 0–40)
Albumin: 4.3 g/dL (ref 3.8–4.8)
Alkaline Phosphatase: 157 IU/L — ABNORMAL HIGH (ref 47–123)
BUN/Creatinine Ratio: 26 — ABNORMAL HIGH (ref 10–24)
BUN: 22 mg/dL (ref 8–27)
Bilirubin Total: 0.6 mg/dL (ref 0.0–1.2)
CO2: 24 mmol/L (ref 20–29)
Calcium: 9.3 mg/dL (ref 8.6–10.2)
Chloride: 99 mmol/L (ref 96–106)
Creatinine, Ser: 0.86 mg/dL (ref 0.76–1.27)
Globulin, Total: 2.7 g/dL (ref 1.5–4.5)
Glucose: 138 mg/dL — ABNORMAL HIGH (ref 70–99)
Potassium: 5.3 mmol/L — ABNORMAL HIGH (ref 3.5–5.2)
Sodium: 135 mmol/L (ref 134–144)
Total Protein: 7 g/dL (ref 6.0–8.5)
eGFR: 88 mL/min/1.73

## 2024-07-07 LAB — BETA-2-GLYCOPROTEIN I ABS, IGG/M/A
Beta-2 Glyco 1 IgA: 15 (ref 0–25)
Beta-2 Glyco 1 IgM: >150 — AB (ref 0–32)
Beta-2 Glyco I IgG: 18 (ref 0–20)

## 2024-07-07 LAB — ANA: Anti Nuclear Antibody (ANA): POSITIVE — AB

## 2024-07-07 LAB — C3 AND C4
Complement C3, Serum: 115 mg/dL (ref 82–167)
Complement C4, Serum: 19 mg/dL (ref 12–38)

## 2024-07-07 LAB — SEDIMENTATION RATE: Sed Rate: 3 mm/h (ref 0–30)

## 2024-07-07 LAB — ANTI-DNA ANTIBODY, DOUBLE-STRANDED: dsDNA Ab: 1 [IU]/mL (ref 0–9)

## 2024-07-07 NOTE — Progress Notes (Signed)
 Anticardiolipin IgM low positive--trending down. IgG and IgA negative.  Beta-2  glycoprotein IgM remains positive-unchanged.   dsDNA is negative-great news

## 2024-07-08 ENCOUNTER — Encounter: Payer: Self-pay | Admitting: Physician Assistant

## 2024-07-08 ENCOUNTER — Ambulatory Visit: Attending: Rheumatology | Admitting: Physician Assistant

## 2024-07-08 VITALS — BP 171/96 | HR 61 | Temp 97.9°F | Resp 14 | Ht 71.0 in | Wt 190.8 lb

## 2024-07-08 DIAGNOSIS — M19041 Primary osteoarthritis, right hand: Secondary | ICD-10-CM | POA: Diagnosis not present

## 2024-07-08 DIAGNOSIS — Z862 Personal history of diseases of the blood and blood-forming organs and certain disorders involving the immune mechanism: Secondary | ICD-10-CM

## 2024-07-08 DIAGNOSIS — M797 Fibromyalgia: Secondary | ICD-10-CM

## 2024-07-08 DIAGNOSIS — M7061 Trochanteric bursitis, right hip: Secondary | ICD-10-CM | POA: Diagnosis not present

## 2024-07-08 DIAGNOSIS — Z8669 Personal history of other diseases of the nervous system and sense organs: Secondary | ICD-10-CM

## 2024-07-08 DIAGNOSIS — I7300 Raynaud's syndrome without gangrene: Secondary | ICD-10-CM

## 2024-07-08 DIAGNOSIS — M359 Systemic involvement of connective tissue, unspecified: Secondary | ICD-10-CM

## 2024-07-08 DIAGNOSIS — Z8719 Personal history of other diseases of the digestive system: Secondary | ICD-10-CM | POA: Diagnosis not present

## 2024-07-08 DIAGNOSIS — R760 Raised antibody titer: Secondary | ICD-10-CM

## 2024-07-08 DIAGNOSIS — Z79899 Other long term (current) drug therapy: Secondary | ICD-10-CM | POA: Diagnosis not present

## 2024-07-08 DIAGNOSIS — M7062 Trochanteric bursitis, left hip: Secondary | ICD-10-CM

## 2024-07-08 DIAGNOSIS — H18519 Endothelial corneal dystrophy, unspecified eye: Secondary | ICD-10-CM

## 2024-07-08 DIAGNOSIS — E7800 Pure hypercholesterolemia, unspecified: Secondary | ICD-10-CM

## 2024-07-08 DIAGNOSIS — M19042 Primary osteoarthritis, left hand: Secondary | ICD-10-CM

## 2024-07-08 DIAGNOSIS — I10 Essential (primary) hypertension: Secondary | ICD-10-CM

## 2024-07-08 DIAGNOSIS — Z889 Allergy status to unspecified drugs, medicaments and biological substances status: Secondary | ICD-10-CM

## 2024-07-08 DIAGNOSIS — R748 Abnormal levels of other serum enzymes: Secondary | ICD-10-CM

## 2024-07-21 ENCOUNTER — Ambulatory Visit: Admitting: Student

## 2024-07-21 ENCOUNTER — Encounter: Payer: Self-pay | Admitting: Student

## 2024-07-21 VITALS — BP 134/80 | HR 60 | Ht 71.0 in | Wt 191.4 lb

## 2024-07-21 DIAGNOSIS — I77819 Aortic ectasia, unspecified site: Secondary | ICD-10-CM | POA: Diagnosis not present

## 2024-07-21 DIAGNOSIS — D509 Iron deficiency anemia, unspecified: Secondary | ICD-10-CM

## 2024-07-21 DIAGNOSIS — Z87898 Personal history of other specified conditions: Secondary | ICD-10-CM

## 2024-07-21 DIAGNOSIS — I10 Essential (primary) hypertension: Secondary | ICD-10-CM

## 2024-07-21 MED ORDER — ATENOLOL 25 MG PO TABS: 12.5000 mg | ORAL_TABLET | Freq: Every day | ORAL | 3 refills | Status: AC

## 2024-07-21 NOTE — Patient Instructions (Signed)
 Medication Instructions:  Your physician recommends that you continue on your current medications as directed. Please refer to the Current Medication list given to you today.  *If you need a refill on your cardiac medications before your next appointment, please call your pharmacy*  Lab Work: NONE   If you have labs (blood work) drawn today and your tests are completely normal, you will receive your results only by: MyChart Message (if you have MyChart) OR A paper copy in the mail If you have any lab test that is abnormal or we need to change your treatment, we will call you to review the results.  Testing/Procedures: Your physician has requested that you have an echocardiogram. Echocardiography is a painless test that uses sound waves to create images of your heart. It provides your doctor with information about the size and shape of your heart and how well your hearts chambers and valves are working. This procedure takes approximately one hour. There are no restrictions for this procedure. Please do NOT wear cologne, perfume, aftershave, or lotions (deodorant is allowed). Please arrive 15 minutes prior to your appointment time.  Please note: We ask at that you not bring children with you during ultrasound (echo/ vascular) testing. Due to room size and safety concerns, children are not allowed in the ultrasound rooms during exams. Our front office staff cannot provide observation of children in our lobby area while testing is being conducted. An adult accompanying a patient to their appointment will only be allowed in the ultrasound room at the discretion of the ultrasound technician under special circumstances. We apologize for any inconvenience.   Follow-Up: At [facility], you and your health needs are our priority.  As part of our continuing mission to provide you with exceptional heart care, our providers are all part of one team.  This team includes your primary Cardiologist  (physician) and Advanced Practice Providers or APPs (Physician Assistants and Nurse Practitioners) who all work together to provide you with the care you need, when you need it.  Your next appointment:   1 year(s)  Provider:   Dorn Ross, MD    We recommend signing up for the patient portal called MyChart.  Sign up information is provided on this After Visit Summary.  MyChart is used to connect with patients for Virtual Visits (Telemedicine).  Patients are able to view lab/test results, encounter notes, upcoming appointments, etc.  Non-urgent messages can be sent to your provider as well.   To learn more about what you can do with MyChart, go to [email or web address].   Other Instructions Thank you for choosing [facility]!

## 2024-08-07 ENCOUNTER — Inpatient Hospital Stay

## 2024-08-14 ENCOUNTER — Inpatient Hospital Stay: Admitting: Oncology

## 2024-11-10 ENCOUNTER — Ambulatory Visit

## 2024-12-14 ENCOUNTER — Other Ambulatory Visit (HOSPITAL_COMMUNITY)

## 2025-01-05 ENCOUNTER — Ambulatory Visit: Admitting: Rheumatology
